# Patient Record
Sex: Female | Born: 1935 | Race: White | Hispanic: No | Smoking: Never smoker
Health system: Southern US, Community
[De-identification: ages and names within clinical notes are randomized; demographics above are authoritative.]

## PROBLEM LIST (undated history)

## (undated) DIAGNOSIS — I10 Essential (primary) hypertension: Secondary | ICD-10-CM

## (undated) DIAGNOSIS — E039 Hypothyroidism, unspecified: Secondary | ICD-10-CM

---

## 2003-10-02 ENCOUNTER — Ambulatory Visit (HOSPITAL_COMMUNITY): Admission: RE | Admit: 2003-10-02 | Discharge: 2003-10-02 | Payer: Self-pay | Admitting: Cardiology

## 2004-08-05 ENCOUNTER — Observation Stay (HOSPITAL_COMMUNITY): Admission: RE | Admit: 2004-08-05 | Discharge: 2004-08-06 | Payer: Self-pay | Admitting: General Surgery

## 2004-08-05 ENCOUNTER — Encounter (INDEPENDENT_AMBULATORY_CARE_PROVIDER_SITE_OTHER): Payer: Self-pay | Admitting: Specialist

## 2004-09-17 ENCOUNTER — Encounter: Admission: RE | Admit: 2004-09-17 | Discharge: 2004-09-17 | Payer: Self-pay | Admitting: Internal Medicine

## 2005-10-07 ENCOUNTER — Ambulatory Visit (HOSPITAL_COMMUNITY): Admission: RE | Admit: 2005-10-07 | Discharge: 2005-10-07 | Payer: Self-pay | Admitting: *Deleted

## 2005-10-07 ENCOUNTER — Encounter (INDEPENDENT_AMBULATORY_CARE_PROVIDER_SITE_OTHER): Payer: Self-pay | Admitting: Specialist

## 2007-10-08 ENCOUNTER — Encounter: Admission: RE | Admit: 2007-10-08 | Discharge: 2007-10-08 | Payer: Self-pay | Admitting: Internal Medicine

## 2010-03-24 ENCOUNTER — Encounter: Payer: Self-pay | Admitting: Internal Medicine

## 2010-06-09 ENCOUNTER — Other Ambulatory Visit: Payer: Self-pay | Admitting: Registered Nurse

## 2010-07-18 NOTE — Op Note (Signed)
NAME:  LTANYA, BAYLEY NO.:  [id number]   MEDICAL RECORD NO.:  [id number]          PATIENT TYPE:  AMB   LOCATION:  ENDO                         FACILITY:  [facility]   PHYSICIAN:  Georgiana Spinner, M.D.    DATE OF BIRTH:  01/25/1924   DATE OF PROCEDURE:  10/07/2005  DATE OF DISCHARGE:                                 OPERATIVE REPORT   PROCEDURE:  Upper endoscopy.   INDICATIONS:  Gastroesophageal reflux disease.   ANESTHESIA:  Fentanyl 40 mcg, Versed 3 mg.  With patient mildly sedated in  the left lateral decubitus position, Olympus videoscopic endoscope was  inserted in the mouth and passed under direct vision through the esophagus  which appeared normal although there was questionable area of Barrett's  photographed and biopsied.  We entered the stomach.  Fundus, body, antrum,  duodenal bulb, second portion of duodenum were visualized.  From this point  the endoscope was slowly withdrawn taking circumferential views of duodenal  mucosa until the endoscope had been pulled back in the stomach and placed on  retroflexion to the stomach from below.  The endoscope was straightened and  withdrawn taking circumferential views of the remaining gastric and  esophageal mucosa stopping to biopsy the gastric fundus where erythematous  changes of possible gastritis were seen.  The patient's vital signs, pulse  oximeter remained stable.  The patient tolerated procedure well without  apparent complications.   FINDINGS:  Question of gastritis, biopsied; question of Barrett's esophagus  biopsied, await biopsy report.  The patient will call me for results and  follow up with me as an outpatient.  Proceed to colonoscopy.           ______________________________  Georgiana Spinner, M.D.     GMO/MEDQ  D:  10/07/2005  T:  10/07/2005  Job:  [id number]

## 2010-07-18 NOTE — Op Note (Signed)
NAME:  Vicki [name], Vicki [name] NO.:  [id number]   MEDICAL RECORD NO.:  [id number]          PATIENT TYPE:  AMB   LOCATION:  DAY                          FACILITY:  [facility]   PHYSICIAN:  Anselm Pancoast. Weatherly, M.D.DATE OF BIRTH:  1923/11/27   DATE OF PROCEDURE:  08/05/2004  DATE OF DISCHARGE:                                 OPERATIVE REPORT   PREOPERATIVE DIAGNOSES:  Chronic cholecystitis with stones.   POSTOPERATIVE DIAGNOSES:  Chronic cholecystitis with stones.   OPERATION:  Laparoscopic cholecystectomy with cholangiogram.   ANESTHESIA:  General.   SURGEON:  Anselm Pancoast. Zachery Dakins, M.D.   ASSISTANT:  Lebron Conners, M.D.   HISTORY:  Vicki [name] is a 77 year old female who was referred to me at  the courtesy of Vangie Bicker, Rochel Brome. from [facility]  for symptomatic gallstones. The patient's preoperative liver function  studies and all were normal and she was given Cipro.   The patient was positioned on the OR table, anesthesia general endotracheal  tube, oral tube into the stomach later. The patient has PAS stockings,  positioned comfortably in the OR table. The abdomen was prepped with  Betadine surgical scrub and solution and draped in a sterile manner. A small  incision was made below the umbilicus, the fatty tissue dissected down, the  fascia identified and a small opening made, two Kocher's placed to pick up  the fascia and we very carefully opened into the peritoneal cavity. A  pursestring suture of #0 Vicryl was placed and the Hasson cannula  introduced. The upper 10 mm trocar was placed in the subxiphoid area and the  two lateral 5 mm trocars were placed by Dr. Orson Slick at the appropriate  lateral position. All the trocar sites were anesthetized with Marcaine with  adrenaline. The gallbladder was grasped upward and outward, retracted, the  adhesions proximally were separated carefully from the gallbladder and the  proximal portion of the  gallbladder in the junction with the cystic duct was  identified. We encompassed the cystic duct and then clipped it flush with  the junction of the gallbladder. A small opening was made in the proximal  cystic duct, a Cooke catheter introduced and held in place with a clip. The  x-ray showed good prompt filling of the extrahepatic biliary system and good  flow into the duodenum. The catheter was removed, the cystic duct was triply  clipped and then divided, the cystic artery was identified, triply clipped  proximally, singly distally and divided and then the gallbladder freed from  its bed with the hook electrocautery. Good hemostasis was obtained and then  the gallbladder was grasped, the camera and the upper 10 mm port withdrawn  with the gallbladder containing a good size single stone at the umbilicus.  The bed was again inspected, good hemostasis. The irrigating fluid was  aspirated and then the additional figure-of-eight suture of #0 Vicryl was  placed at the umbilicus and both sutures were tied. I then removed the 5 mm  trocars under direct vision, good  hemostasis and the carbon dioxide released, the upper 10 mm trocar  withdrawn.  The subcutaneous wounds were closed with 4-0 Vicryl, Benzoin and  Steri-Strips on the skin. The patient tolerated the procedure nicely and was  sent to the recovery room extubated in good satisfactory postop condition.      WJW/MEDQ  D:  08/05/2004  T:  08/05/2004  Job:  [id number]   cc:   Juline Patch, M.D.  [address]  [place], [place] [address]  Fax: [phone]

## 2010-07-18 NOTE — Op Note (Signed)
NAME:  Vicki [name], Vicki [name] NO.:  [id number]   MEDICAL RECORD NO.:  [id number]          PATIENT TYPE:  AMB   LOCATION:  ENDO                         FACILITY:  [facility]   PHYSICIAN:  Georgiana Spinner, M.D.    DATE OF BIRTH:  1923-05-24   DATE OF PROCEDURE:  10/07/2005  DATE OF DISCHARGE:                                 OPERATIVE REPORT   PROCEDURE:  Colonoscopy.   INDICATIONS:  Colon cancer screening, diarrhea.   ANESTHESIA:  Fentanyl 40 mcg, Versed 1 mg.   PROCEDURE:  With patient mildly sedated in the left lateral decubitus  position, the Olympus videoscopic colonoscope was inserted in the rectum and  passed under direct vision to the sigmoid colon.  Therefore I elected to  withdraw this, I then inserted the Olympus video colonoscope PCF 160, passed  this under direct vision with pressure applied.  We reached the cecum  identified by ileocecal valve and appendiceal orifice both which were  photographed.  From this point the colonoscope was slowly withdrawn taking  circumferential views of colonic mucosa stopping as we withdrew all the way  to the rectum only to take random biopsies of normal-appearing colonic  mucosa and stopping to photograph diverticula that was seen in the sigmoid  colon until we reached the rectum which appeared normal on direct and showed  hemorrhoids on retroflexed view.  The endoscope was straightened, withdrawn.  The patient's vital signs, pulse oximeter remained stable.  The patient  tolerated procedure well without apparent complications.   FINDINGS:  Diverticulosis of sigmoid colon fairly extensive, internal  hemorrhoids.  Await biopsy report.  The patient will call me for results and  follow up with me as an outpatient.           ______________________________  Georgiana Spinner, M.D.     GMO/MEDQ  D:  10/07/2005  T:  10/07/2005  Job:  [id number]

## 2011-01-19 ENCOUNTER — Other Ambulatory Visit: Payer: Self-pay | Admitting: Internal Medicine

## 2011-01-19 DIAGNOSIS — R1084 Generalized abdominal pain: Secondary | ICD-10-CM

## 2011-01-20 ENCOUNTER — Other Ambulatory Visit: Payer: Self-pay

## 2011-01-20 ENCOUNTER — Ambulatory Visit
Admission: RE | Admit: 2011-01-20 | Discharge: 2011-01-20 | Disposition: A | Discharge status: Home / Self Care | Payer: Self-pay | Source: Ambulatory Visit | Attending: Internal Medicine | Admitting: Internal Medicine

## 2011-01-20 DIAGNOSIS — R1084 Generalized abdominal pain: Secondary | ICD-10-CM

## 2011-01-20 MED ORDER — IOHEXOL 300 MG/ML  SOLN
100.0000 mL | Freq: Once | INTRAMUSCULAR | Status: AC | PRN
  Administered 2011-01-20: 100 mL via INTRAVENOUS

## 2012-05-16 IMAGING — CT CT ABD-PELV W/ CM
2 of 5 series · 16 of 46 positions shown, 18 images · IV contrast (READICAT/WATER & OMNI 300/[ID])
Comparison: None.

CLINICAL DATA: Abdominal pain generalized, mainly right lower
quadrant. Some epigastric pain for 3 weeks.  Constipation.

CT ABDOMEN AND PELVIS WITH CONTRAST
TECHNIQUE: Multidetector CT imaging of the abdomen and pelvis was
performed following the standard protocol during bolus
administration of intravenous contrast.
Contrast: 100mL OMNIPAQUE IOHEXOL 300 MG/ML IV SOLN

[Series 2: abd/pelvis with · axial · 0.89mm/px · z∈[-430,-30]mm · 13 of 90 slices shown, 15 images]
[im 5/90  soft-tissue]
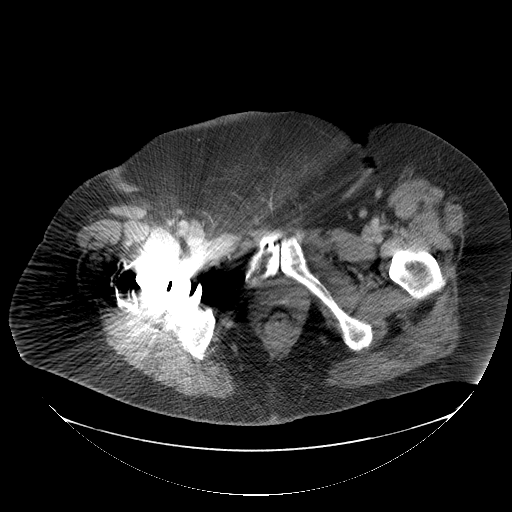
[im 5/90  bone]
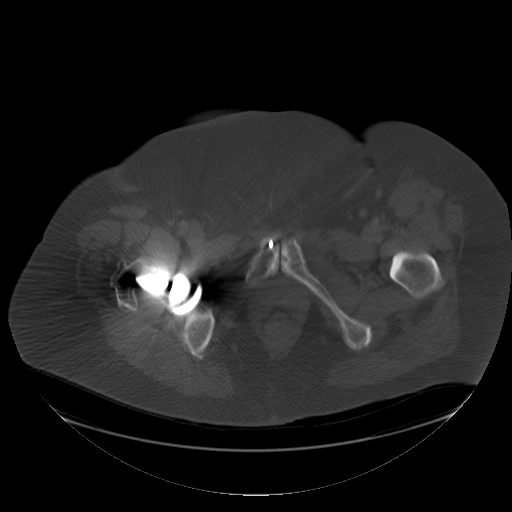
[im 15/90  soft-tissue]
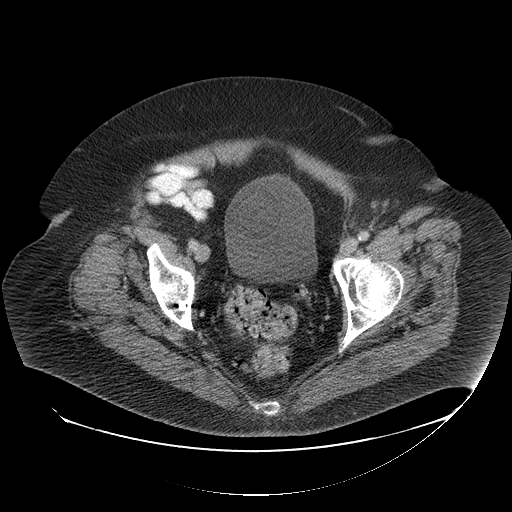
[im 19/90  soft-tissue]
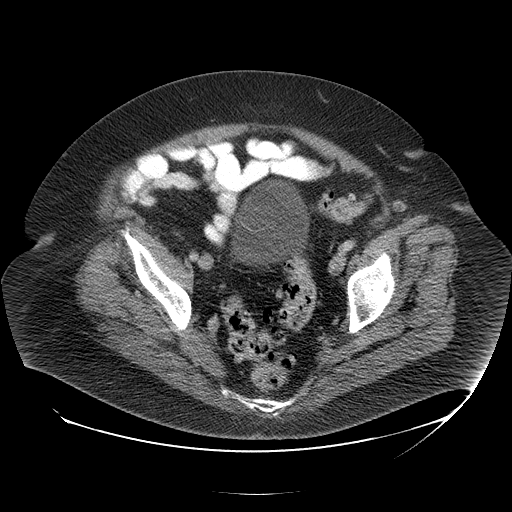
[im 24/90  soft-tissue]
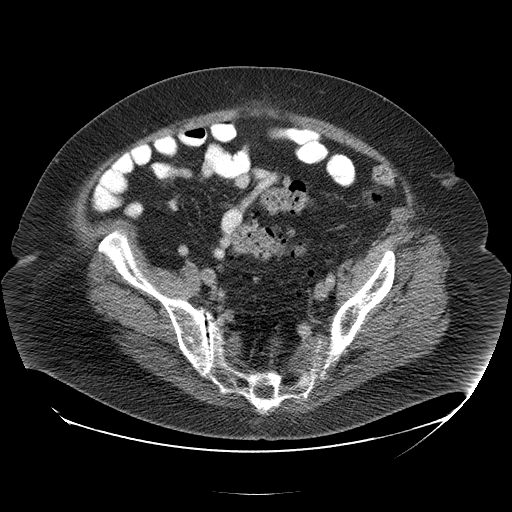
[im 33/90  soft-tissue]
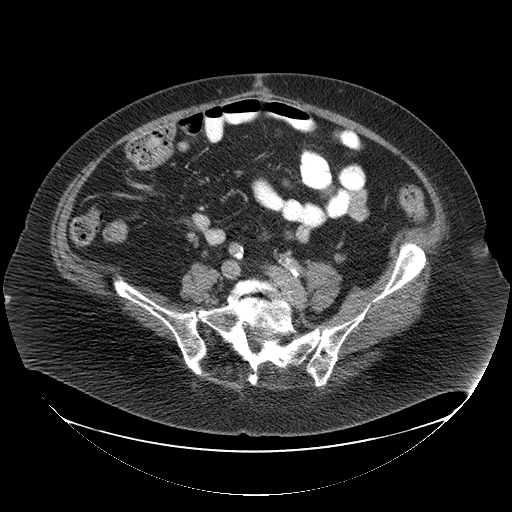
[im 38/90  soft-tissue]
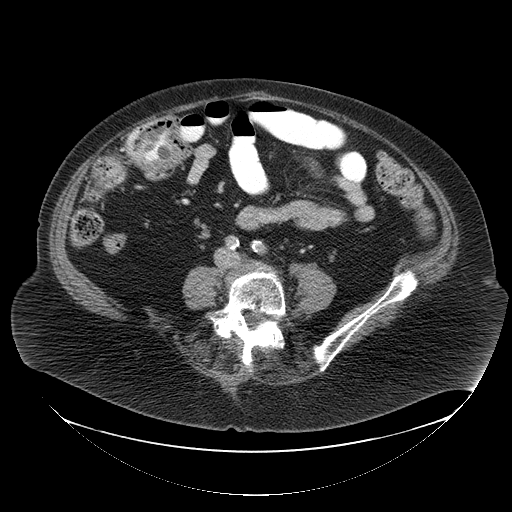
[im 47/90  soft-tissue]
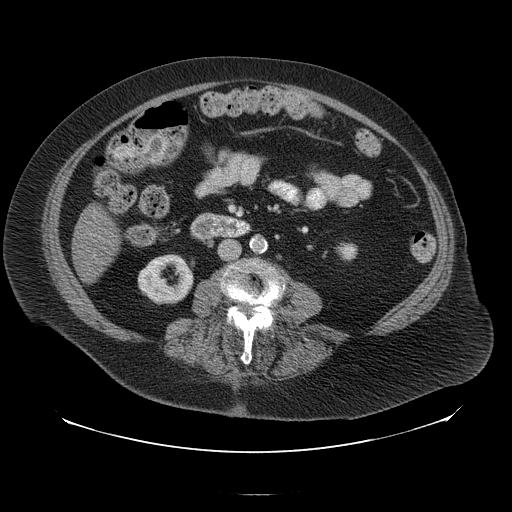
[im 52/90  soft-tissue]
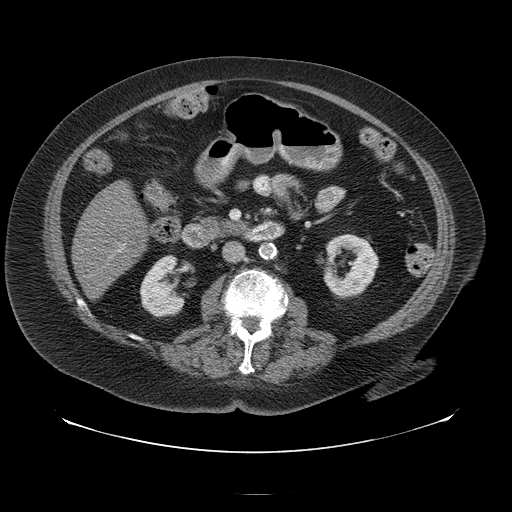
[im 57/90  soft-tissue]
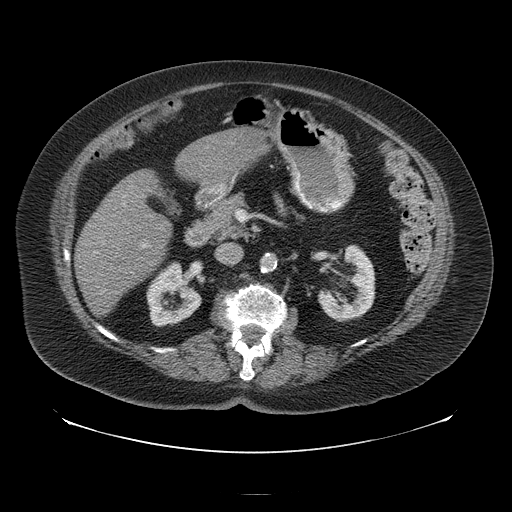
[im 57/90  bone]
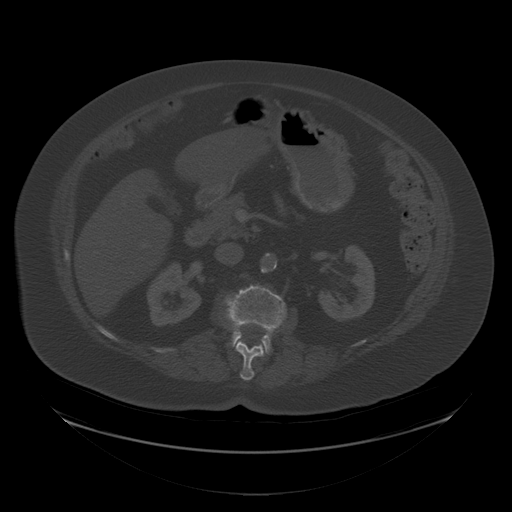
[im 66/90  soft-tissue]
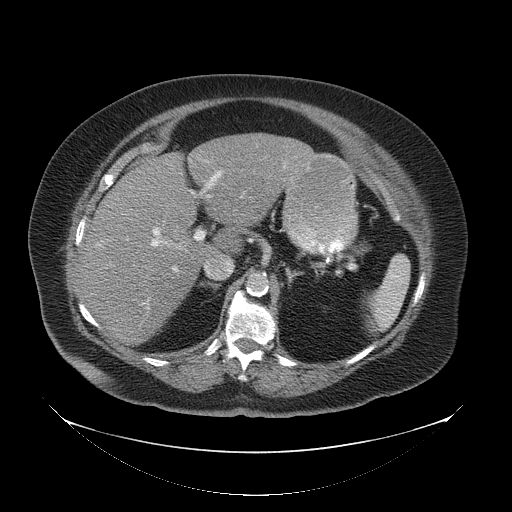
[im 71/90  soft-tissue]
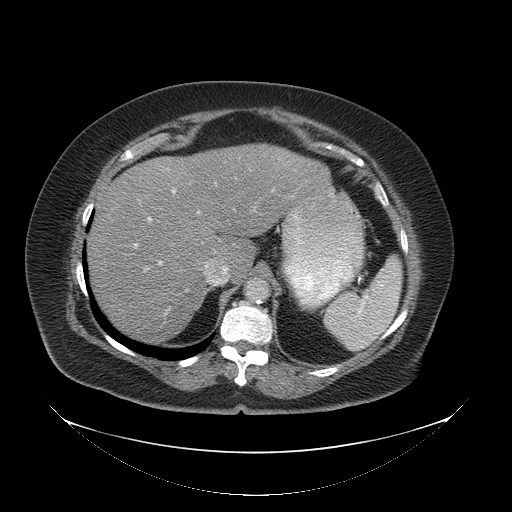
[im 75/90  soft-tissue]
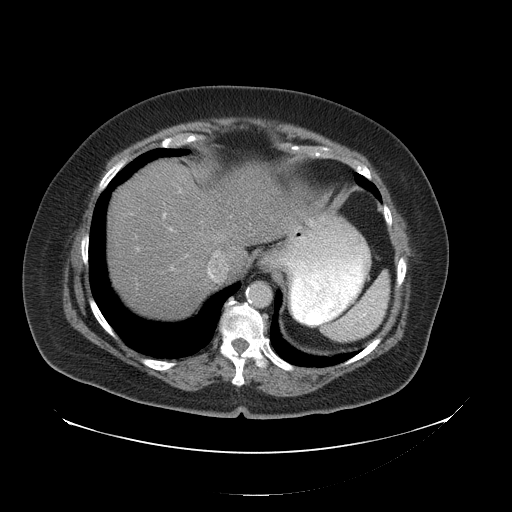
[im 85/90  soft-tissue]
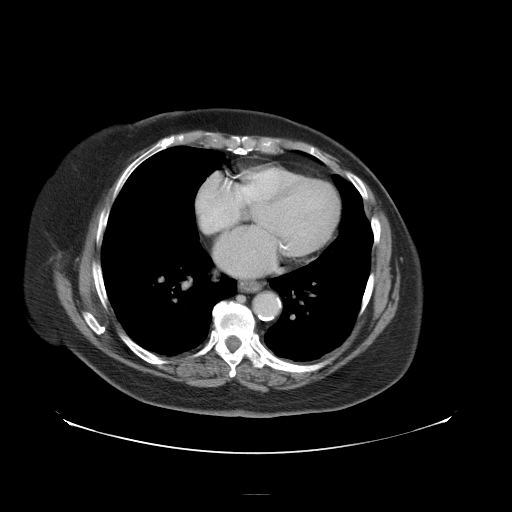

[Series 401: coronal · coronal · 0.89mm/px · 3 of 141 slices shown]
[im 47/141  soft-tissue]
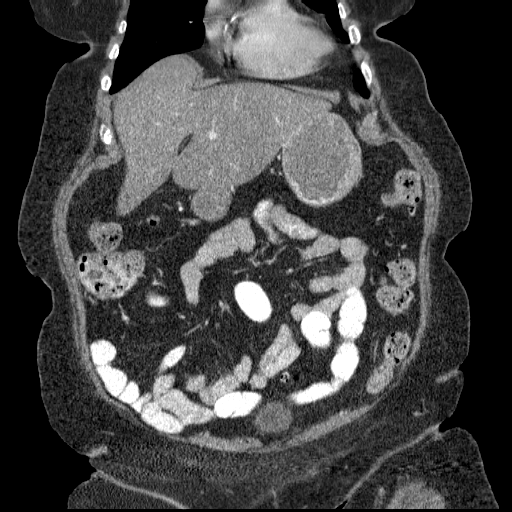
[im 63/141  soft-tissue]
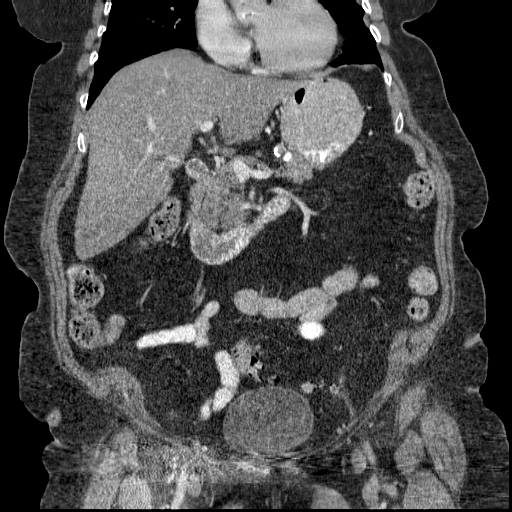
[im 78/141  soft-tissue]
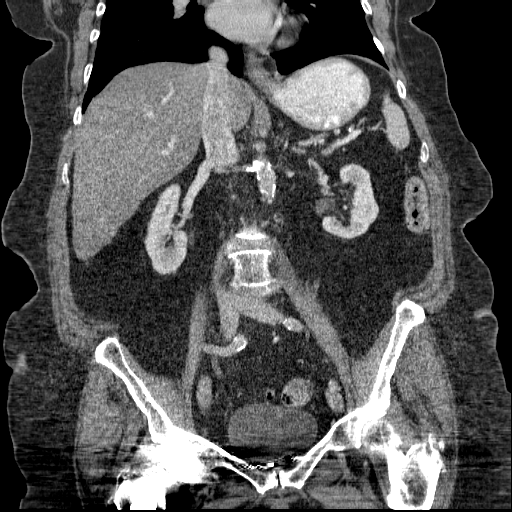

[16 of 46 positions shown; findings below may reference images not displayed]

FINDINGS: Colonic diverticula most notable sigmoid colon region
without surrounding inflammation.  No extraluminal bowel
inflammatory process noted.  Appendix not visualized.  Small hiatal
hernia.  No free fluid or free intraperitoneal air.

Coronary artery calcifications.  Heart size top normal.

Atherosclerotic type changes of the aorta and branch vessels.  No
abdominal aortic aneurysm.  Mild to moderate narrowing proximal
celiac artery and superior mesenteric artery. 1.0 cm splenic artery
aneurysm suspected.  Moderate narrowing left common iliac artery.

Fatty liver.  Left lobe of liver cysts side-by-side.  1.1 cm low
density lesion inferior aspect right lobe liver probably a cyst
although incompletely assessed.  Post cholecystectomy.

No focal splenic, pancreatic, adrenal or renal lesion.

Scoliosis and degenerative changes throughout the lumbar spine.
Various degrees of spinal stenosis and foraminal narrowing.  Donor
site of bone graft right ilium.  Sacroiliac joint degenerative
changes.

Post hysterectomy.  Noncontrast filled views of the urinary bladder
without gross abnormality.  No pelvic or abdominal adenopathy.  Top
normal sized portacaval lymph node.

Streak artifact caused by the right hip replacement.
IMPRESSION: Fatty liver containing cysts.

Colonic diverticula most notable sigmoid colon.  No bowel
inflammatory process noted.  Appendix not visualized.

Post cholecystectomy.

Atherosclerotic type changes coronary arteries, aorta and aortic
branch vessels as detailed above.  1.0 cm splenic artery aneurysm
suspected.

Scoliosis and degenerative changes.

## 2021-10-20 ENCOUNTER — Encounter (HOSPITAL_BASED_OUTPATIENT_CLINIC_OR_DEPARTMENT_OTHER): Payer: Medicare Other | Attending: Internal Medicine | Admitting: Internal Medicine

## 2021-10-20 DIAGNOSIS — L97812 Non-pressure chronic ulcer of other part of right lower leg with fat layer exposed: Secondary | ICD-10-CM | POA: Diagnosis not present

## 2021-10-20 DIAGNOSIS — W19XXXA Unspecified fall, initial encounter: Secondary | ICD-10-CM | POA: Insufficient documentation

## 2021-10-20 DIAGNOSIS — I110 Hypertensive heart disease with heart failure: Secondary | ICD-10-CM | POA: Insufficient documentation

## 2021-10-20 DIAGNOSIS — I87311 Chronic venous hypertension (idiopathic) with ulcer of right lower extremity: Secondary | ICD-10-CM | POA: Diagnosis not present

## 2021-10-20 DIAGNOSIS — E039 Hypothyroidism, unspecified: Secondary | ICD-10-CM | POA: Insufficient documentation

## 2021-10-20 DIAGNOSIS — T798XXA Other early complications of trauma, initial encounter: Secondary | ICD-10-CM | POA: Insufficient documentation

## 2021-10-20 DIAGNOSIS — I509 Heart failure, unspecified: Secondary | ICD-10-CM | POA: Insufficient documentation

## 2021-10-20 NOTE — Progress Notes (Signed)
Vicki [name], Vicki [name] ([id number]) . Visit Report for 10/20/2021 Abuse Risk Screen Details Patient Name: Date of Service: Vicki [name], Vicki [name] 10/20/2021 9:45 A M Medical Record Number: [id number] Patient Account Number: [id number] Date of Birth/Sex: Treating RN: October 15, 1923 (88 y.o. F) Primary Care Mardel Grudzien: Sonia Side Other Clinician: Referring Sherill Mangen: Treating Else Habermann/Extender: Radene Ou in Treatment: 0 Abuse Risk Screen Items Answer ABUSE RISK SCREEN: Has anyone close to you tried to hurt or harm you recentlyo No Do you feel uncomfortable with anyone in your familyo No Has anyone forced you do things that you didnt want to doo No Electronic Signature(s) Signed: 10/20/2021 4:55:34 PM By: Thayer Dallas Entered By: Thayer Dallas on 10/20/2021 10:21:19 -------------------------------------------------------------------------------- Activities of Daily Living Details Patient Name: Date of Service: Vicki [name] 10/20/2021 9:45 A M Medical Record Number: [id number] Patient Account Number: [id number] Date of Birth/Sex: Treating RN: 1923/04/18 (88 y.o. F) Primary Care Jayde Daffin: Sonia Side Other Clinician: Referring Landrie Beale: Treating Anhthu Perdew/Extender: Radene Ou in Treatment: 0 Activities of Daily Living Items Answer Activities of Daily Living (Please select one for each item) Drive Automobile Not Able [name] Medications ake Completely Able Use [name] elephone Completely Able Care for Appearance Completely Able Use [name] oilet Completely Able Bath / Shower Need Assistance Dress Self Completely Able Feed Self Completely Able Walk Completely Able Get In / Out Bed Completely Able Housework Completely Able Prepare Meals Completely Able Handle Money Completely Able Shop for Self Need Assistance Electronic Signature(s) Signed: 10/20/2021 4:55:34 PM By: Thayer Dallas Entered By: Thayer Dallas on 10/20/2021  10:22:23 -------------------------------------------------------------------------------- Education Screening Details Patient Name: Date of Service: Vicki Presume [name]. 10/20/2021 9:45 A M Medical Record Number: [id number] Patient Account Number: [id number] Date of Birth/Sex: Treating RN: [birth year] (88 y.o. F) Primary Care Dawnya Grams: Sonia Side Other Clinician: Referring Davinci Glotfelty: Treating Haasini Patnaude/Extender: Radene Ou in Treatment: 0 Primary Learner Assessed: Patient Learning Preferences/Education Level/Primary Language Learning Preference: Explanation, Demonstration, Communication Board Highest Education Level: College or Above Preferred Language: English Cognitive Barrier Language Barrier: No Translator Needed: No Memory Deficit: No Emotional Barrier: No Cultural/Religious Beliefs Affecting Medical Care: No Physical Barrier Impaired Vision: Yes Glasses Impaired Hearing: No Decreased Hand dexterity: No Knowledge/Comprehension Knowledge Level: High Comprehension Level: High Ability to understand written instructions: High Ability to understand verbal instructions: High Motivation Anxiety Level: Calm Cooperation: Cooperative Education Importance: Acknowledges Need Interest in Health Problems: Asks Questions Perception: Coherent Willingness to Engage in Self-Management High Activities: Readiness to Engage in Self-Management High Activities: Electronic Signature(s) Signed: 10/20/2021 4:55:34 PM By: Thayer Dallas Entered By: Thayer Dallas on 10/20/2021 10:22:58 -------------------------------------------------------------------------------- Fall Risk Assessment Details Patient Name: Date of Service: Vicki Presume [name]. 10/20/2021 9:45 A M Medical Record Number: [id number] Patient Account Number: [id number] Date of Birth/Sex: Treating RN: November 27, 1923 (88 y.o. Vicki [name] Primary Care Ilija Maxim: Sonia Side Other  Clinician: Referring Yvonnie Schinke: Treating Zarya Lasseigne/Extender: Radene Ou in Treatment: 0 Fall Risk Assessment Items Have you had 2 or more falls in the last 12 monthso 0 No Have you had any fall that resulted in injury in the last 12 monthso 0 Yes FALLS RISK SCREEN History of falling - immediate or within 3 months 25 Yes Secondary diagnosis (Do you have 2 or more medical diagnoseso) 0 No Ambulatory aid None/bed rest/wheelchair/nurse 0 No Crutches/cane/walker 15 Yes Furniture 0 No Intravenous therapy Access/Saline/Heparin Lock 0 No Gait/Transferring Normal/ bed rest/  wheelchair 0 No Weak (short steps with or without shuffle, stooped but able to lift head while walking, may seek 10 Yes support from furniture) Impaired (short steps with shuffle, may have difficulty arising from chair, head down, impaired 0 No balance) Mental Status Oriented to own ability 0 Yes Electronic Signature(s) Signed: 10/20/2021 4:49:28 PM By: Shawn Stall RN, BSN Entered By: Shawn Stall on 10/20/2021 10:11:19 -------------------------------------------------------------------------------- Foot Assessment Details Patient Name: Date of Service: Vicki Presume [name]. 10/20/2021 9:45 A M Medical Record Number: [id number] Patient Account Number: [id number] Date of Birth/Sex: Treating RN: 1923/09/21 (88 y.o. Vicki [name] Primary Care Sandralee Tarkington: Sonia Side Other Clinician: Referring Deah Ottaway: Treating Cledith Abdou/Extender: Radene Ou in Treatment: 0 Foot Assessment Items Site Locations + = Sensation present, - = Sensation absent, C = Callus, U = Ulcer R = Redness, W = Warmth, M = Maceration, PU = Pre-ulcerative lesion F = Fissure, S = Swelling, D = Dryness Assessment Right: Left: Other Deformity: No No Prior Foot Ulcer: No No Prior Amputation: No No Charcot Joint: No No Ambulatory Status: Ambulatory With Help Assistance Device: Walker GaitAdministrator, Civil Service) Signed: 10/20/2021 4:49:28 PM By: Shawn Stall RN, BSN Entered By: Shawn Stall on 10/20/2021 10:08:23 -------------------------------------------------------------------------------- Nutrition Risk Screening Details Patient Name: Date of Service: Vicki Presume [name]. 10/20/2021 9:45 A M Medical Record Number: [id number] Patient Account Number: [id number] Date of Birth/Sex: Treating RN: 30-May-1923 (88 y.o. Vicki [name] Primary Care Ivery Nanney: Sonia Side Other Clinician: Referring Domonique Cothran: Treating Afia Messenger/Extender: Radene Ou in Treatment: 0 Height (in): 62 Weight (lbs): 142 Body Mass Index (BMI): 26 Nutrition Risk Screening Items Score Screening NUTRITION RISK SCREEN: I have an illness or condition that made me change the kind and/or amount of food I eat 2 Yes I eat fewer than two meals per day 0 No I eat few fruits and vegetables, or milk products 0 No I have three or more drinks of beer, liquor or wine almost every day 0 No I have tooth or mouth problems that make it hard for me to eat 0 No I don'[name] always have enough money to buy the food I need 0 No I eat alone most of the time 0 No I take three or more different prescribed or over-the-counter drugs a day 1 Yes Without wanting to, I have lost or gained 10 pounds in the last six months 0 No I am not always physically able to shop, cook and/or feed myself 0 No Nutrition Protocols Good Risk Protocol Moderate Risk Protocol 0 Provide education on nutrition High Risk Proctocol Risk Level: Moderate Risk Score: 3 Electronic Signature(s) Signed: 10/20/2021 4:49:28 PM By: Shawn Stall RN, BSN Entered By: Shawn Stall on 10/20/2021 10:08:04

## 2021-10-20 NOTE — Progress Notes (Signed)
Vicki [name], Vicki [name] ([id number]) . Visit Report for 10/20/2021 Chief Complaint Document Details Patient Name: Date of Service: Vicki [name], Vicki [name] 10/20/2021 9:45 A M Medical Record Number: [id number] Patient Account Number: [id number] Date of Birth/Sex: Treating RN: 1923-11-29 (88 y.o. F) Primary Care Provider: Sonia Side Other Clinician: Referring Provider: Treating Provider/Extender: Radene Ou in Treatment: 0 Information Obtained from: Patient Chief Complaint 10/20/2021; right lower extremity wound status post trauma Electronic Signature(s) Signed: 10/20/2021 2:26:36 PM By: Geralyn Corwin DO Entered By: Geralyn Corwin on 10/20/2021 10:52:01 -------------------------------------------------------------------------------- Debridement Details Patient Name: Date of Service: Vicki [name]. 10/20/2021 9:45 A M Medical Record Number: [id number] Patient Account Number: [id number] Date of Birth/Sex: Treating RN: 11/04/1923 (88 y.o. Vicki [name], [name] Primary Care Provider: Sonia Side Other Clinician: Referring Provider: Treating Provider/Extender: Radene Ou in Treatment: 0 Debridement Performed for Assessment: Wound #1 Right,Lateral Lower Leg Performed By: Physician Geralyn Corwin, DO Debridement Type: Debridement Level of Consciousness (Pre-procedure): Awake and Alert Pre-procedure Verification/Time Out Yes - 10:30 Taken: Start Time: 10:31 Pain Control: Lidocaine 4% [name] Area Debrided (L x W): otal 13.6 (cm) x 5 (cm) = 68 (cm) Tissue and other material debrided: Viable, Non-Viable, Slough, Subcutaneous, Slough, Hyper-granulation Level: Skin/Subcutaneous Tissue Debridement Description: Excisional Instrument: Curette Bleeding: Minimum Hemostasis Achieved: Pressure End Time: 10:40 Procedural Pain: 0 Post Procedural Pain: 0 Response to Treatment: Procedure was tolerated well Level of  Consciousness (Post- Awake and Alert procedure): Post Debridement Measurements of Total Wound Length: (cm) 13.6 Width: (cm) 5 Depth: (cm) 0.5 Volume: (cm) 26.704 Character of Wound/Ulcer Post Debridement: Requires Further Debridement Post Procedure Diagnosis Same as Pre-procedure Electronic Signature(s) Signed: 10/20/2021 2:26:36 PM By: Geralyn Corwin DO Signed: 10/20/2021 4:49:28 PM By: Shawn Stall RN, BSN Entered By: Shawn Stall on 10/20/2021 10:40:29 -------------------------------------------------------------------------------- HPI Details Patient Name: Date of Service: Vicki [name]. 10/20/2021 9:45 A M Medical Record Number: [id number] Patient Account Number: [id number] Date of Birth/Sex: Treating RN: 1924/01/23 (88 y.o. F) Primary Care Provider: Sonia Side Other Clinician: Referring Provider: Treating Provider/Extender: Radene Ou in Treatment: 0 History of Present Illness HPI Description: Admission 10/20/2021 [name] is a 88 year old female with a past medical history of hypothyroidism and essential hypertension that presents to the clinic for a 60-month history of ulcer to the right lower extremity. On 08/15/2021 the patient experienced a mechanical fall and hit her right leg against her table. She developed a hematoma and she was evaluated in the ED for this issue. The hematoma was evacuated in the ED. She had x-rays of the tibia/fibula and ankle that showed no acute osseous abnormalities. She has been using silver alginate to the wound bed. She did have 2 rounds of doxycycline for this issue and recently completed her second course. She currently denies signs of infection. Electronic Signature(s) Signed: 10/20/2021 2:26:36 PM By: Geralyn Corwin DO Entered By: Geralyn Corwin on 10/20/2021 11:00:50 -------------------------------------------------------------------------------- Physical Exam Details Patient Name: Date  of Service: Vicki [name]. 10/20/2021 9:45 A M Medical Record Number: [id number] Patient Account Number: [id number] Date of Birth/Sex: Treating RN: 10/25/1923 (88 y.o. F) Primary Care Provider: Sonia Side Other Clinician: Referring Provider: Treating Provider/Extender: Radene Ou in Treatment: 0 Constitutional respirations regular, non-labored and within target range for patient.. Cardiovascular 2+ dorsalis pedis/posterior tibialis pulses. Psychiatric pleasant and cooperative. Notes Right lower extremity: [name] the anterior aspect there  is a large open wound with hyper granulated tissue, granulation tissue and nonviable tissue. No tunneling or o undermining noted. No increased warmth, erythema or purulent drainage noted. Venous stasis dermatitis. Electronic Signature(s) Signed: 10/20/2021 2:26:36 PM By: Geralyn Corwin DO Entered By: Geralyn Corwin on 10/20/2021 11:01:27 -------------------------------------------------------------------------------- Physician Orders Details Patient Name: Date of Service: Vicki [name]. 10/20/2021 9:45 A M Medical Record Number: [id number] Patient Account Number: [id number] Date of Birth/Sex: Treating RN: 04-02-1923 (88 y.o. Vicki [name] Primary Care Provider: Sonia Side Other Clinician: Referring Provider: Treating Provider/Extender: Radene Ou in Treatment: 0 Verbal / Phone Orders: No Diagnosis Coding ICD-10 Coding Code Description [id number] Non-pressure chronic ulcer of other part of right lower leg with fat layer exposed T79.8XXA Other early complications of trauma, initial encounter E03.9 Hypothyroidism, unspecified Follow-up Appointments ppointment in 1 week. - Dr. Mikey [name] and [place], Room 8 130pm 10/27/2021 Monday Return A ppointment in 2 weeks. - Dr. Mikey [name] and [place], Room 8 1115 am 11/04/2021 Tuesday Return A Anesthetic (In clinic) Topical Lidocaine 4%  applied to wound bed Bathing/ Shower/ Hygiene May shower with protection but do not get wound dressing(s) wet. Edema Control - Lymphedema / SCD / Other Elevate legs to the level of the heart or above for 30 minutes daily and/or when sitting, a frequency of: - 3-4 times a day throughout the day. Avoid standing for long periods of time. Exercise regularly Home Health New wound care orders this week; continue Home Health for wound care. May utilize formulary equivalent dressing for wound treatment orders unless otherwise specified. - home health to change once a week. wound center weekly. Hydrofera blue, abd pad, and kerlix coban as compression to right leg. Other Home Health Orders/Instructions: [facility] Wound Treatment Wound #1 - Lower Leg Wound Laterality: Right, Lateral Cleanser: Soap and Water (Home Health) 2 x Per Week/30 Days Discharge Instructions: May shower and wash wound with dial antibacterial soap and water prior to dressing change. Cleanser: Wound Cleanser (Home Health) 2 x Per Week/30 Days Discharge Instructions: Cleanse the wound with wound cleanser prior to applying a clean dressing using gauze sponges, not tissue or cotton balls. Peri-Wound Care: Sween Lotion (Moisturizing lotion) (Home Health) 2 x Per Week/30 Days Discharge Instructions: Apply moisturizing lotion as directed Topical: Gentamicin 2 x Per Week/30 Days Discharge Instructions: ***APPLY ONLY IN CLINIC.**** Topical: Mupirocin Ointment 2 x Per Week/30 Days Discharge Instructions: Apply Mupirocin ONLY IN CLINIC**** Prim Dressing: Hydrofera Blue Classic Foam, 4x4 in (Home Health) 2 x Per Week/30 Days ary Discharge Instructions: Moisten with saline prior to applying to wound bed Secondary Dressing: ABD Pad, 8x10 (Home Health) 2 x Per Week/30 Days Discharge Instructions: Apply over primary dressing as directed. Secondary Dressing: Woven Gauze Sponge, Non-Sterile 4x4 in (Home Health) 2 x Per Week/30  Days Discharge Instructions: Apply over primary dressing as directed. Compression Wrap: Kerlix Roll 4.5x3.1 (in/yd) (Home Health) 2 x Per Week/30 Days Discharge Instructions: Apply Kerlix and Coban compression as directed. Compression Wrap: Coban Self-Adherent Wrap 4x5 (in/yd) (Home Health) 2 x Per Week/30 Days Discharge Instructions: Apply over Kerlix as directed. Patient Medications llergies: penicillin, codeine, Sulfa (Sulfonamide Antibiotics), latex, penbutolol A Notifications Medication Indication Start End 10/20/2021 lidocaine DOSE topical 4 % cream - cream topical applied only in clinic for debridement by provider. Electronic Signature(s) Signed: 10/20/2021 2:26:36 PM By: Geralyn Corwin DO Entered By: Geralyn Corwin on 10/20/2021 11:01:34 -------------------------------------------------------------------------------- Problem List Details Patient Name: Date of Service: Driscilla Grammes  Debby Freiberg, Tremaine [name]. 10/20/2021 9:45 A M Medical Record Number: [id number] Patient Account Number: [id number] Date of Birth/Sex: Treating RN: [birth year] (88 y.o. F) Primary Care Provider: Sonia SidePang, Richard Y Other Clinician: Referring Provider: Treating Provider/Extender: Radene OuHoffman, Zeth Buday Pang, Richard Y Weeks in Treatment: 0 Active Problems ICD-10 Encounter Code Description Active Date MDM Diagnosis L97.812 Non-pressure chronic ulcer of other part of right lower leg with fat layer 10/20/2021 No Yes exposed T79.8XXA Other early complications of trauma, initial encounter 10/20/2021 No Yes E03.9 Hypothyroidism, unspecified 10/20/2021 No Yes I87.311 Chronic venous hypertension (idiopathic) with ulcer of right lower extremity 10/20/2021 No Yes Inactive Problems Resolved Problems Electronic Signature(s) Signed: 10/20/2021 2:26:36 PM By: Geralyn CorwinHoffman, Abdulloh Ullom DO Entered By: Geralyn CorwinHoffman, Phillis Thackeray on 10/20/2021 10:51:04 -------------------------------------------------------------------------------- Progress Note  Details Patient Name: Date of Service: Vicki PresumeA LDERMA N, Mishika [name]. 10/20/2021 9:45 A M Medical Record Number: [id number] Patient Account Number: [id number] Date of Birth/Sex: Treating RN: [birth year] (88 y.o. F) Primary Care Provider: Sonia SidePang, Richard Y Other Clinician: Referring Provider: Treating Provider/Extender: Radene OuHoffman, Dafna Romo Pang, Richard Y Weeks in Treatment: 0 Subjective Chief Complaint Information obtained from Patient 10/20/2021; right lower extremity wound status post trauma History of Present Illness (HPI) Admission 10/20/2021 Ms. Juanell FairlyRuth Davie is a 88 year old female with a past medical history of hypothyroidism and essential hypertension that presents to the clinic for a 3719-month history of ulcer to the right lower extremity. On 08/15/2021 the patient experienced a mechanical fall and hit her right leg against her table. She developed a hematoma and she was evaluated in the ED for this issue. The hematoma was evacuated in the ED. She had x-rays of the tibia/fibula and ankle that showed no acute osseous abnormalities. She has been using silver alginate to the wound bed. She did have 2 rounds of doxycycline for this issue and recently completed her second course. She currently denies signs of infection. Patient History Information obtained from Patient. Allergies penicillin, codeine, Sulfa (Sulfonamide Antibiotics), latex, penbutolol Family History Diabetes - Child, Heart Disease - Child,Mother,Father,Siblings, Hypertension - Father,Mother, No family history of Cancer, Hereditary Spherocytosis, Kidney Disease, Lung Disease, Seizures, Stroke, Thyroid Problems, Tuberculosis. Social History Never smoker, Marital Status - Widowed, Alcohol Use - Never, Drug Use - No History, Caffeine Use - Rarely. Medical History Eyes Denies history of Cataracts, Glaucoma, Optic Neuritis Ear/Nose/Mouth/Throat Denies history of Chronic sinus problems/congestion, Middle ear  problems Hematologic/Lymphatic Denies history of Anemia, Hemophilia, Human Immunodeficiency Virus, Lymphedema, Sickle Cell Disease Respiratory Denies history of Aspiration, Asthma, Chronic Obstructive Pulmonary Disease (COPD), Pneumothorax, Sleep Apnea, Tuberculosis Cardiovascular Patient has history of Congestive Heart Failure, Hypertension Denies history of Angina, Arrhythmia, Coronary Artery Disease, Deep Vein Thrombosis, Hypotension, Myocardial Infarction, Peripheral Arterial Disease, Peripheral Venous Disease, Phlebitis, Vasculitis Gastrointestinal Denies history of Cirrhosis , Colitis, Crohnoos, Hepatitis A, Hepatitis B, Hepatitis C Endocrine Denies history of Type I Diabetes, Type II Diabetes Immunological Denies history of Lupus Erythematosus, Raynaudoos, Scleroderma Integumentary (Skin) Denies history of History of Burn Musculoskeletal Patient has history of Osteoarthritis Denies history of Gout, Rheumatoid Arthritis, Osteomyelitis Neurologic Patient has history of Neuropathy Denies history of Dementia, Quadriplegia, Paraplegia, Seizure Disorder Psychiatric Denies history of Anorexia/bulimia, Confinement Anxiety Hospitalization/Surgery History - back surgery. - right hip surgery. - hysterotomy. Medical A Surgical History Notes nd Ear/Nose/Mouth/Throat hearing aides Genitourinary Stage three Review of Systems (ROS) Constitutional Symptoms (General Health) Complains or has symptoms of Marked Weight Change. Denies complaints or symptoms of Fatigue, Fever, Chills. Eyes Complains or has symptoms of Glasses / Contacts. Denies complaints or symptoms of Dry Eyes,  Vision Changes. Ear/Nose/Mouth/Throat Denies complaints or symptoms of Chronic sinus problems or rhinitis. Respiratory Denies complaints or symptoms of Chronic or frequent coughs, Shortness of Breath. Cardiovascular Denies complaints or symptoms of Chest pain. Gastrointestinal Denies complaints or symptoms of  Frequent diarrhea, Nausea, Vomiting. Endocrine Denies complaints or symptoms of Heat/cold intolerance. Integumentary (Skin) Complains or has symptoms of Wounds, Right leg Musculoskeletal Denies complaints or symptoms of Muscle Pain, Muscle Weakness. Psychiatric Denies complaints or symptoms of Claustrophobia, Suicidal. Objective Constitutional respirations regular, non-labored and within target range for patient.. Vitals Time Taken: 9:58 AM, Height: 62 in, Source: Stated, Weight: 142 lbs, Source: Stated, BMI: 26, Temperature: 98.1 F, Pulse: 60 bpm, Respiratory Rate: 18 breaths/min, Blood Pressure: 126/65 mmHg. Cardiovascular 2+ dorsalis pedis/posterior tibialis pulses. Psychiatric pleasant and cooperative. General Notes: Right lower extremity: [name] the anterior aspect there is a large open wound with hyper granulated tissue, granulation tissue and nonviable tissue. o No tunneling or undermining noted. No increased warmth, erythema or purulent drainage noted. Venous stasis dermatitis. Integumentary (Hair, Skin) Wound #1 status is Open. Original cause of wound was Trauma. The date acquired was: 08/04/2021. The wound is located on the Right,Lateral Lower Leg. The wound measures 13.6cm length x 5cm width x 0.5cm depth; 53.407cm^2 area and 26.704cm^3 volume. There is Fat Layer (Subcutaneous Tissue) exposed. There is no tunneling or undermining noted. There is a large amount of serosanguineous drainage noted. The wound margin is distinct with the outline attached to the wound base. There is small (1-33%) red, pink, hyper - granulation within the wound bed. There is a large (67-100%) amount of necrotic tissue within the wound bed including Adherent Slough. Assessment Active Problems ICD-10 Non-pressure chronic ulcer of other part of right lower leg with fat layer exposed Other early complications of trauma, initial encounter Hypothyroidism, unspecified Chronic venous hypertension (idiopathic)  with ulcer of right lower extremity Patient presents with a 64-month history of ulcer to her right lower extremity secondary to trauma and complicated by hematoma and venous insufficiency. She has tried silver alginate to the wound bed. She has home health that helps with dressing changes. ABIs were noncompressible however pulses were heard on Doppler to the dorsalis pedis and posterior tibial. She would benefit from a light compression such as Kerlix/Coban as she has evidence of venous insufficiency on exam. No signs of infection. I debrided nonviable tissue and I recommended gentamicin/mupirocin ointment to address any bioburden along with Hydrofera Blue under the wrap. She knows to not get this wet and cannot keep this on for more than 7 days. A cast protector bag was recommended if she would like to shower. Follow-up in 1 week. We will have home health change the dressing once weekly. Procedures Wound #1 Pre-procedure diagnosis of Wound #1 is an Abrasion located on the Right,Lateral Lower Leg . There was a Excisional Skin/Subcutaneous Tissue Debridement with a total area of 68 sq cm performed by Geralyn Corwin, DO. With the following instrument(s): Curette to remove Viable and Non-Viable tissue/material. Material removed includes Subcutaneous Tissue, Slough, and Hyper-granulation after achieving pain control using Lidocaine 4% [name] opical Solution. A time out was conducted at 10:30, prior to the start of the procedure. A Minimum amount of bleeding was controlled with Pressure. The procedure was tolerated well with a pain level of 0 throughout and a pain level of 0 following the procedure. Post Debridement Measurements: 13.6cm length x 5cm width x 0.5cm depth; 26.704cm^3 volume. Character of Wound/Ulcer Post Debridement requires further debridement. Post procedure Diagnosis Wound #  1: Same as Pre-Procedure Plan Follow-up Appointments: Return Appointment in 1 week. - Dr. Mikey [name] and [place], Room 8  130pm 10/27/2021 Monday Return Appointment in 2 weeks. - Dr. Mikey [name] and [place], Room 8 1115 am 11/04/2021 Tuesday Anesthetic: (In clinic) Topical Lidocaine 4% applied to wound bed Bathing/ Shower/ Hygiene: May shower with protection but do not get wound dressing(s) wet. Edema Control - Lymphedema / SCD / Other: Elevate legs to the level of the heart or above for 30 minutes daily and/or when sitting, a frequency of: - 3-4 times a day throughout the day. Avoid standing for long periods of time. Exercise regularly Home Health: New wound care orders this week; continue Home Health for wound care. May utilize formulary equivalent dressing for wound treatment orders unless otherwise specified. - home health to change once a week. wound center weekly. Hydrofera blue, abd pad, and kerlix coban as compression to right leg. Other Home Health Orders/Instructions: [facility] The following medication(s) was prescribed: lidocaine topical 4 % cream cream topical applied only in clinic for debridement by provider. was prescribed at facility WOUND #1: - Lower Leg Wound Laterality: Right, Lateral Cleanser: Soap and Water (Home Health) 2 x Per Week/30 Days Discharge Instructions: May shower and wash wound with dial antibacterial soap and water prior to dressing change. Cleanser: Wound Cleanser (Home Health) 2 x Per Week/30 Days Discharge Instructions: Cleanse the wound with wound cleanser prior to applying a clean dressing using gauze sponges, not tissue or cotton balls. Peri-Wound Care: Sween Lotion (Moisturizing lotion) (Home Health) 2 x Per Week/30 Days Discharge Instructions: Apply moisturizing lotion as directed Topical: Gentamicin 2 x Per Week/30 Days Discharge Instructions: ***APPLY ONLY IN CLINIC.**** Topical: Mupirocin Ointment 2 x Per Week/30 Days Discharge Instructions: Apply Mupirocin ONLY IN CLINIC**** Prim Dressing: Hydrofera Blue Classic Foam, 4x4 in (Home Health) 2 x Per Week/30  Days ary Discharge Instructions: Moisten with saline prior to applying to wound bed Secondary Dressing: ABD Pad, 8x10 (Home Health) 2 x Per Week/30 Days Discharge Instructions: Apply over primary dressing as directed. Secondary Dressing: Woven Gauze Sponge, Non-Sterile 4x4 in (Home Health) 2 x Per Week/30 Days Discharge Instructions: Apply over primary dressing as directed. Com pression Wrap: Kerlix Roll 4.5x3.1 (in/yd) (Home Health) 2 x Per Week/30 Days Discharge Instructions: Apply Kerlix and Coban compression as directed. Com pression Wrap: Coban Self-Adherent Wrap 4x5 (in/yd) (Home Health) 2 x Per Week/30 Days Discharge Instructions: Apply over Kerlix as directed. 1. In office sharp debridement 2. Gentamicin/mupirocin with Hydrofera Blue under Kerlix/Coban 3. Follow-up in 1 week Electronic Signature(s) Signed: 10/20/2021 2:26:36 PM By: Geralyn Corwin DO Entered By: Geralyn Corwin on 10/20/2021 11:04:04 -------------------------------------------------------------------------------- HxROS Details Patient Name: Date of Service: Vicki [name]. 10/20/2021 9:45 A M Medical Record Number: [id number] Patient Account Number: [id number] Date of Birth/Sex: Treating RN: [birth year] (88 y.o. F) Primary Care Provider: Sonia Side Other Clinician: Referring Provider: Treating Provider/Extender: Radene Ou in Treatment: 0 Information Obtained From Patient Constitutional Symptoms (General Health) Complaints and Symptoms: Positive for: Marked Weight Change Negative for: Fatigue; Fever; Chills Eyes Complaints and Symptoms: Positive for: Glasses / Contacts Negative for: Dry Eyes; Vision Changes Medical History: Negative for: Cataracts; Glaucoma; Optic Neuritis Ear/Nose/Mouth/Throat Complaints and Symptoms: Negative for: Chronic sinus problems or rhinitis Medical History: Negative for: Chronic sinus problems/congestion; Middle ear problems Past  Medical History Notes: hearing aides Respiratory Complaints and Symptoms: Negative for: Chronic or frequent coughs; Shortness of Breath Medical History: Negative for:  Aspiration; Asthma; Chronic Obstructive Pulmonary Disease (COPD); Pneumothorax; Sleep Apnea; Tuberculosis Cardiovascular Complaints and Symptoms: Negative for: Chest pain Medical History: Positive for: Congestive Heart Failure; Hypertension Negative for: Angina; Arrhythmia; Coronary Artery Disease; Deep Vein Thrombosis; Hypotension; Myocardial Infarction; Peripheral Arterial Disease; Peripheral Venous Disease; Phlebitis; Vasculitis Gastrointestinal Complaints and Symptoms: Negative for: Frequent diarrhea; Nausea; Vomiting Medical History: Negative for: Cirrhosis ; Colitis; Crohns; Hepatitis A; Hepatitis B; Hepatitis C Endocrine Complaints and Symptoms: Negative for: Heat/cold intolerance Medical History: Negative for: Type I Diabetes; Type II Diabetes Integumentary (Skin) Complaints and Symptoms: Positive for: Wounds Review of System Notes: Right leg Medical History: Negative for: History of Burn Musculoskeletal Complaints and Symptoms: Negative for: Muscle Pain; Muscle Weakness Medical History: Positive for: Osteoarthritis Negative for: Gout; Rheumatoid Arthritis; Osteomyelitis Psychiatric Complaints and Symptoms: Negative for: Claustrophobia; Suicidal Medical History: Negative for: Anorexia/bulimia; Confinement Anxiety Hematologic/Lymphatic Medical History: Negative for: Anemia; Hemophilia; Human Immunodeficiency Virus; Lymphedema; Sickle Cell Disease Genitourinary Medical History: Past Medical History Notes: Stage three Immunological Medical History: Negative for: Lupus Erythematosus; Raynauds; Scleroderma Neurologic Medical History: Positive for: Neuropathy Negative for: Dementia; Quadriplegia; Paraplegia; Seizure Disorder Oncologic Immunizations Pneumococcal Vaccine: Received Pneumococcal  Vaccination: Yes Received Pneumococcal Vaccination On or After 60th Birthday: Yes Implantable Devices Yes Hospitalization / Surgery History Type of Hospitalization/Surgery back surgery right hip surgery hysterotomy Family and Social History Cancer: No; Diabetes: Yes - Child; Heart Disease: Yes - Child,Mother,Father,Siblings; Hereditary Spherocytosis: No; Hypertension: Yes - Father,Mother; Kidney Disease: No; Lung Disease: No; Seizures: No; Stroke: No; Thyroid Problems: No; Tuberculosis: No; Never smoker; Marital Status - Widowed; Alcohol Use: Never; Drug Use: No History; Caffeine Use: Rarely; Financial Concerns: No; Food, Clothing or Shelter Needs: No; Support System Lacking: No; Transportation Concerns: No Electronic Signature(s) Signed: 10/20/2021 2:26:36 PM By: Geralyn Corwin DO Signed: 10/20/2021 4:55:34 PM By: Thayer Dallas Entered By: Thayer Dallas on 10/20/2021 10:28:34 -------------------------------------------------------------------------------- SuperBill Details Patient Name: Date of Service: Vicki [name]. 10/20/2021 Medical Record Number: [id number] Patient Account Number: [id number] Date of Birth/Sex: Treating RN: [birth year] (88 y.o. Vicki [name] Primary Care Provider: Sonia Side Other Clinician: Referring Provider: Treating Provider/Extender: Radene Ou in Treatment: 0 Diagnosis Coding ICD-10 Codes Code Description [id number] 9619 Non-pressure chronic ulcer of other part of right lower leg with fat layer exposed T79.8XXA Other early complications of trauma, initial encounter E03.9 Hypothyroidism, unspecified I87.311 Chronic venous hypertension (idiopathic) with ulcer of right lower extremity Facility Procedures CPT4 Code: [id number] 110 ICD L9 Description: 99214 - WOUND CARE VISIT-LEV 4 EST PT 42 - DEB SUBQ TISSUE 20 SQ CM/< -10 Diagnosis Description 7.812 Non-pressure chronic ulcer of other part of right lower leg  with fat layer exp Modifier: 1 osed Quantity: 1 CPT4 Code: 78469629 110 ICD L9 Description: 45 - DEB SUBQ TISS EA ADDL 20CM -10 Diagnosis Description 7.812 Non-pressure chronic ulcer of other part of right lower leg with fat layer exp Modifier: 3 osed Quantity: Physician Procedures : CPT4 Code Description Modifier [id number] 44010 - WC PHYS LEVEL 4 - NEW PT ICD-10 Diagnosis Description L97.812 Non-pressure chronic ulcer of other part of right lower leg with fat layer exposed I87.311 Chronic venous hypertension (idiopathic) with ulcer  of right lower extremity T79.8XXA Other early complications of trauma, initial encounter E03.9 Hypothyroidism, unspecified Quantity: 1 : [id number] 11042 - WC PHYS SUBQ TISS 20 SQ CM ICD-10 Diagnosis Description L97.812 Non-pressure chronic ulcer of other part of right lower leg with fat layer exposed Quantity: 1 : 4403474 11045 - WC PHYS  SUBQ TISS EA ADDL 20 CM ICD-10 Diagnosis Description L97.812 Non-pressure chronic ulcer of other part of right lower leg with fat layer exposed Quantity: 3 Electronic Signature(s) Signed: 10/20/2021 2:26:36 PM By: Geralyn Corwin DO Entered By: Geralyn Corwin on 10/20/2021 11:04:29

## 2021-10-20 NOTE — Progress Notes (Signed)
ELEASE, SWARM T ([id number]) . Visit Report for 10/20/2021 Allergy List Details Patient Name: Date of Service: Vicki [name], Vicki [name] 10/20/2021 9:45 A M Medical Record Number: [id number] Patient Account Number: [id number] Date of Birth/Sex: Treating RN: 1923/10/13 (88 y.o. F) Primary Care Lakie Mclouth: Mortimer Fries Other Clinician: Referring Myleka Moncure: Treating Leonardo Makris/Extender: Gust Rung in Treatment: 0 Allergies Active Allergies penicillin codeine Sulfa (Sulfonamide Antibiotics) latex penbutolol Allergy Notes Electronic Signature(s) Signed: 10/20/2021 4:55:34 PM By: Erenest Blank Entered By: Erenest Blank on 10/20/2021 10:06:09 -------------------------------------------------------------------------------- Arrival Information Details Patient Name: Date of Service: Vicki Rhymes T. 10/20/2021 9:45 A M Medical Record Number: [id number] Patient Account Number: [id number] Date of Birth/Sex: Treating RN: 09-Sep-1923 (88 y.o. F) Primary Care Sophiah Rolin: Mortimer Fries Other Clinician: Referring Merced Brougham: Treating Charla Criscione/Extender: Gust Rung in Treatment: 0 Visit Information Patient Arrived: Vicki [name] Time: 09:45 Accompanied By: daughter in law Transfer Assistance: None Patient Identification Verified: Yes Secondary Verification Process Completed: Yes Patient Requires Transmission-Based Precautions: No Patient Has Alerts: No Electronic Signature(s) Signed: 10/20/2021 4:55:34 PM By: Erenest Blank Entered By: Erenest Blank on 10/20/2021 09:49:37 -------------------------------------------------------------------------------- Clinic Level of Care Assessment Details Patient Name: Date of Service: Vicki [name] 10/20/2021 9:45 A M Medical Record Number: [id number] Patient Account Number: [id number] Date of Birth/Sex: Treating RN: 12/29/1923 (88 y.o. [name] Primary Care Oryn Casanova: Mortimer Fries  Other Clinician: Referring Chanita Boden: Treating Aryanah Enslow/Extender: Gust Rung in Treatment: 0 Clinic Level of Care Assessment Items TOOL 1 Quantity Score X- 1 0 Use when EandM and Procedure is performed on INITIAL visit ASSESSMENTS - Nursing Assessment / Reassessment X- 1 20 General Physical Exam (combine w/ comprehensive assessment (listed just below) when performed on new pt. evals) X- 1 25 Comprehensive Assessment (HX, ROS, Risk Assessments, Wounds Hx, etc.) ASSESSMENTS - Wound and Skin Assessment / Reassessment X- 1 10 Dermatologic / Skin Assessment (not related to wound area) ASSESSMENTS - Ostomy and/or Continence Assessment and Care _0  - 0 Incontinence Assessment and Management _1  - 0 Ostomy Care Assessment and Management (repouching, etc.) PROCESS - Coordination of Care _2  - 0 Simple Patient / Family Education for ongoing care X- 1 20 Complex (extensive) Patient / Family Education for ongoing care X- 1 10 Staff obtains Programmer, systems, Records, T Results / Process Orders est X- 1 10 Staff telephones HHA, Nursing Homes / Clarify orders / etc _3  - 0 Routine Transfer to another Facility (non-emergent condition) _4  - 0 Routine Hospital Admission (non-emergent condition) X- 1 15 New Admissions / Biomedical engineer / Ordering NPWT Apligraf, etc. , _5  - 0 Emergency Hospital Admission (emergent condition) PROCESS - Special Needs _6  - 0 Pediatric / Minor Patient Management _7  - 0 Isolation Patient Management _8  - 0 Hearing / Language / Visual special needs _9  - 0 Assessment of Community assistance (transportation, D/C planning, etc.) _10  - 0 Additional assistance / Altered mentation _11  - 0 Support Surface(s) Assessment (bed, cushion, seat, etc.) INTERVENTIONS - Miscellaneous _12  - 0 External ear exam _13  - 0 Patient Transfer (multiple staff / Civil Service fast streamer / Similar devices) _14  - 0 Simple Staple / Suture removal (25 or less) _15  -  0 Complex Staple / Suture removal (26 or more) _16  - 0 Hypo/Hyperglycemic Management (do not check if billed separately) X- 1 15 Ankle / Brachial Index (ABI) - do not check if billed separately Has the patient been seen at the hospital within the last three years:  Yes Total Score: 125 Level Of Care: New/Established - Level 4 Electronic Signature(s) Signed: 10/20/2021 4:49:28 PM By: Deon Pilling RN, BSN Signed: 10/20/2021 4:49:28 PM By: Deon Pilling RN, BSN Entered By: Deon Pilling on 10/20/2021 10:42:35 -------------------------------------------------------------------------------- Encounter Discharge Information Details Patient Name: Date of Service: Vicki Rhymes T. 10/20/2021 9:45 A M Medical Record Number: [id number] Patient Account Number: [id number] Date of Birth/Sex: Treating RN: Dec 25, 1923 (88 y.o. Vicki [name], Meta.Reding Primary Care Alanta Scobey: Mortimer Fries Other Clinician: Referring Kiegan Macaraeg: Treating Suresh Audi/Extender: Gust Rung in Treatment: 0 Encounter Discharge Information Items Post Procedure Vitals Discharge Condition: Stable Temperature (F): 98.1 Ambulatory Status: Walker Pulse (bpm): 60 Discharge Destination: Home Respiratory Rate (breaths/min): 18 Transportation: Private Auto Blood Pressure (mmHg): 126/65 Accompanied By: Vicki [name] IN LAW Schedule Follow-up Appointment: Yes Clinical Summary of Care: Electronic Signature(s) Signed: 10/20/2021 4:49:28 PM By: Deon Pilling RN, BSN Entered By: Deon Pilling on 10/20/2021 10:43:19 -------------------------------------------------------------------------------- Lower Extremity Assessment Details Patient Name: Date of Service: Vicki Rhymes T. 10/20/2021 9:45 A M Medical Record Number: [id number] Patient Account Number: [id number] Date of Birth/Sex: Treating RN: Jun 28, 1923 (88 y.o. Vicki [name], Meta.Reding Primary Care Athen Riel: Mortimer Fries Other Clinician: Referring  Shreyansh Tiffany: Treating Consuella Scurlock/Extender: Gust Rung in Treatment: 0 Edema Assessment Assessed: [Left: No] [Right: Yes] Edema: [Left: Ye] [Right: s] Calf Left: Right: Point of Measurement: 28 cm From Medial Instep 34.5 cm Ankle Left: Right: Point of Measurement: 12 cm From Medial Instep 22 cm Knee To Floor Left: Right: From Medial Instep 42 cm Vascular Assessment Pulses: Dorsalis Pedis Palpable: [Right:Yes] Doppler Audible: [Right:Yes] Posterior Tibial Palpable: [Right:No Inaudible] Notes Unable to complete ABI due to unable to hear pulse with BP cuff. Electronic Signature(s) Signed: 10/20/2021 4:49:28 PM By: Deon Pilling RN, BSN Entered By: Deon Pilling on 10/20/2021 10:09:18 -------------------------------------------------------------------------------- Multi Wound Chart Details Patient Name: Date of Service: Vicki Rhymes T. 10/20/2021 9:45 A M Medical Record Number: [id number] Patient Account Number: [id number] Date of Birth/Sex: Treating RN: 03-09-1923 (88 y.o. F) Primary Care Antwoine Zorn: Mortimer Fries Other Clinician: Referring Vernetta Dizdarevic: Treating Heena Woodbury/Extender: Gust Rung in Treatment: 0 Vital Signs Height(in): 62 Pulse(bpm): 60 Weight(lbs): 142 Blood Pressure(mmHg): 126/65 Body Mass Index(BMI): 26 Temperature(F): 98.1 Respiratory Rate(breaths/min): 18 Photos: [N/A:N/A] Right, Lateral Lower Leg N/A N/A Wound Location: Trauma N/A N/A Wounding Event: Abrasion N/A N/A Primary Etiology: Lymphedema N/A N/A Secondary Etiology: 08/04/2021 N/A N/A Date Acquired: 0 N/A N/A Weeks of Treatment: Open N/A N/A Wound Status: No N/A N/A Wound Recurrence: 13.6x5x0.5 N/A N/A Measurements L x W x D (cm) 53.407 N/A N/A A (cm) : rea 26.704 N/A N/A Volume (cm) : Full Thickness Without Exposed N/A N/A Classification: Support Structures Large N/A N/A Exudate A mount: Serosanguineous N/A  N/A Exudate Type: red, brown N/A N/A Exudate Color: Distinct, outline attached N/A N/A Wound Margin: Small (1-33%) N/A N/A Granulation A mount: Red, Pink, Hyper-granulation N/A N/A Granulation Quality: Large (67-100%) N/A N/A Necrotic A mount: Fat Layer (Subcutaneous Tissue): Yes N/A N/A Exposed Structures: Fascia: No Tendon: No Muscle: No Joint: No Bone: No Small (1-33%) N/A N/A Epithelialization: Debridement - Excisional N/A N/A Debridement: Pre-procedure Verification/Time Out 10:30 N/A N/A Taken: Lidocaine 4% Topical Solution N/A N/A Pain Control: Subcutaneous, Slough N/A N/A Tissue Debrided: Skin/Subcutaneous Tissue N/A N/A Level: 68 N/A N/A Debridement A (sq cm): rea Curette N/A N/A Instrument: Minimum N/A N/A Bleeding: Pressure N/A N/A Hemostasis Achieved: 0 N/A N/A Procedural Pain: 0  N/A N/A Post Procedural Pain: Procedure was tolerated well N/A N/A Debridement Treatment Response: 13.6x5x0.5 N/A N/A Post Debridement Measurements L x W x D (cm) 26.704 N/A N/A Post Debridement Volume: (cm) Debridement N/A N/A Procedures Performed: Treatment Notes Wound #1 (Lower Leg) Wound Laterality: Right, Lateral Cleanser Soap and Water Discharge Instruction: May shower and wash wound with dial antibacterial soap and water prior to dressing change. Wound Cleanser Discharge Instruction: Cleanse the wound with wound cleanser prior to applying a clean dressing using gauze sponges, not tissue or cotton balls. Peri-Wound Care Sween Lotion (Moisturizing lotion) Discharge Instruction: Apply moisturizing lotion as directed Topical Gentamicin Discharge Instruction: ***APPLY ONLY IN CLINIC.**** Mupirocin Ointment Discharge Instruction: Apply Mupirocin ONLY IN CLINIC**** Primary Dressing Hydrofera Blue Classic Foam, 4x4 in Discharge Instruction: Moisten with saline prior to applying to wound bed Secondary Dressing ABD Pad, 8x10 Discharge Instruction: Apply over  primary dressing as directed. Woven Gauze Sponge, Non-Sterile 4x4 in Discharge Instruction: Apply over primary dressing as directed. Secured With Compression Wrap Kerlix Roll 4.5x3.1 (in/yd) Discharge Instruction: Apply Kerlix and Coban compression as directed. Coban Self-Adherent Wrap 4x5 (in/yd) Discharge Instruction: Apply over Kerlix as directed. Compression Stockings Add-Ons Electronic Signature(s) Signed: 10/20/2021 2:26:36 PM By: Kalman Shan DO Entered By: Kalman Shan on 10/20/2021 10:51:33 -------------------------------------------------------------------------------- Multi-Disciplinary Care Plan Details Patient Name: Date of Service: Vicki Rhymes T. 10/20/2021 9:45 A M Medical Record Number: [id number] Patient Account Number: [id number] Date of Birth/Sex: Treating RN: Jul 23, 1923 (88 y.o. Vicki [name], Meta.Reding Primary Care Shriyan Arakawa: Mortimer Fries Other Clinician: Referring Eyal Greenhaw: Treating Lakyla Biswas/Extender: Gust Rung in Treatment: 0 Active Inactive Nutrition Nursing Diagnoses: Potential for alteratiion in Nutrition/Potential for imbalanced nutrition Goals: Patient/caregiver agrees to and verbalizes understanding of need to obtain nutritional consultation Date Initiated: 10/20/2021 Target Resolution Date: 11/06/2021 Goal Status: Active Interventions: Provide education on nutrition Treatment Activities: Patient referred to Primary Care Physician for further nutritional evaluation : 10/20/2021 Notes: Orientation to the Wound Care Program Nursing Diagnoses: Knowledge deficit related to the wound healing center program Goals: Patient/caregiver will verbalize understanding of the [place] Date Initiated: 10/20/2021 Target Resolution Date: 11/06/2021 Goal Status: Active Interventions: Provide education on orientation to the wound center Notes: Pain, Acute or Chronic Nursing Diagnoses: Pain, acute or  chronic: actual or potential Potential alteration in comfort, pain Goals: Patient will verbalize adequate pain control and receive pain control interventions during procedures as needed Date Initiated: 10/20/2021 Target Resolution Date: 11/06/2021 Goal Status: Active Patient/caregiver will verbalize comfort level met Date Initiated: 10/20/2021 Target Resolution Date: 11/06/2021 Goal Status: Active Interventions: Assess comfort goal upon admission Provide education on pain management Treatment Activities: Administer pain control measures as ordered : 10/20/2021 Notes: Wound/Skin Impairment Nursing Diagnoses: Knowledge deficit related to ulceration/compromised skin integrity Goals: Patient/caregiver will verbalize understanding of skin care regimen Date Initiated: 10/20/2021 Target Resolution Date: 11/06/2021 Goal Status: Active Interventions: Assess patient/caregiver ability to perform ulcer/skin care regimen upon admission and as needed Assess ulceration(s) every visit Provide education on ulcer and skin care Treatment Activities: Skin care regimen initiated : 10/20/2021 Topical wound management initiated : 10/20/2021 Notes: Electronic Signature(s) Signed: 10/20/2021 4:49:28 PM By: Deon Pilling RN, BSN Entered By: Deon Pilling on 10/20/2021 10:25:50 -------------------------------------------------------------------------------- Pain Assessment Details Patient Name: Date of Service: Vicki Rhymes T. 10/20/2021 9:45 A M Medical Record Number: [id number] Patient Account Number: [id number] Date of Birth/Sex: Treating RN: 09/08/1923 (88 y.o. [name] Primary Care Jahrell Hamor: Mortimer Fries Other Clinician: Referring Davanna He:  Treating Karla Pavone/Extender: Gust Rung in Treatment: 0 Active Problems Location of Pain Severity and Description of Pain Patient Has Paino No Site Locations Pain Management and Medication Current Pain  Management: Notes Currently 0/10. Electronic Signature(s) Signed: 10/20/2021 4:49:28 PM By: Deon Pilling RN, BSN Entered By: Deon Pilling on 10/20/2021 10:11:07 -------------------------------------------------------------------------------- Patient/Caregiver Education Details Patient Name: Date of Service: Sheliah Hatch 8/21/2023andnbsp9:45 A M Medical Record Number: [id number] Patient Account Number: [id number] Date of Birth/Gender: Treating RN: [birth year] (88 y.o. [name] Primary Care Physician: Mortimer Fries Other Clinician: Referring Physician: Treating Physician/Extender: Gust Rung in Treatment: 0 Education Assessment Education Provided To: Patient Education Topics Provided Nutrition: Handouts: Nutrition Methods: Explain/Verbal, Printed Responses: Reinforcements needed [place]: o Handouts: Welcome T [facility] [place] o Methods: Explain/Verbal, Printed Responses: Reinforcements needed Wound/Skin Impairment: Handouts: Caring for Your Ulcer, Skin Care Do's and Dont's Methods: Explain/Verbal, Printed Responses: Reinforcements needed Electronic Signature(s) Signed: 10/20/2021 4:49:28 PM By: Deon Pilling RN, BSN Entered By: Deon Pilling on 10/20/2021 10:26:15 -------------------------------------------------------------------------------- Wound Assessment Details Patient Name: Date of Service: Vicki Rhymes T. 10/20/2021 9:45 A M Medical Record Number: [id number] Patient Account Number: [id number] Date of Birth/Sex: Treating RN: Feb 10, 1924 (88 y.o. Vicki [name], Meta.Reding Primary Care Tynslee Bowlds: Mortimer Fries Other Clinician: Referring Rafeef Lau: Treating Javonn Gauger/Extender: Gust Rung in Treatment: 0 Wound Status Wound Number: 1 Primary Etiology: Abrasion Wound Location: Right, Lateral Lower Leg Secondary Etiology: Lymphedema Wounding Event: Trauma Wound  Status: Open Date Acquired: 08/04/2021 Weeks Of Treatment: 0 Clustered Wound: No Photos Wound Measurements Length: (cm) 13.6 Width: (cm) 5 Depth: (cm) 0.5 Area: (cm) 53.407 Volume: (cm) 26.704 % Reduction in Area: % Reduction in Volume: Epithelialization: Small (1-33%) Tunneling: No Undermining: No Wound Description Classification: Full Thickness Without Exposed Support Structures Wound Margin: Distinct, outline attached Exudate Amount: Large Exudate Type: Serosanguineous Exudate Color: red, brown Foul Odor After Cleansing: No Slough/Fibrino Yes Wound Bed Granulation Amount: Small (1-33%) Exposed Structure Granulation Quality: Red, Pink, Hyper-granulation Fascia Exposed: No Necrotic Amount: Large (67-100%) Fat Layer (Subcutaneous Tissue) Exposed: Yes Necrotic Quality: Adherent Slough Tendon Exposed: No Muscle Exposed: No Joint Exposed: No Bone Exposed: No Treatment Notes Wound #1 (Lower Leg) Wound Laterality: Right, Lateral Cleanser Soap and Water Discharge Instruction: May shower and wash wound with dial antibacterial soap and water prior to dressing change. Wound Cleanser Discharge Instruction: Cleanse the wound with wound cleanser prior to applying a clean dressing using gauze sponges, not tissue or cotton balls. Peri-Wound Care Sween Lotion (Moisturizing lotion) Discharge Instruction: Apply moisturizing lotion as directed Topical Gentamicin Discharge Instruction: ***APPLY ONLY IN CLINIC.**** Mupirocin Ointment Discharge Instruction: Apply Mupirocin ONLY IN CLINIC**** Primary Dressing Hydrofera Blue Classic Foam, 4x4 in Discharge Instruction: Moisten with saline prior to applying to wound bed Secondary Dressing ABD Pad, 8x10 Discharge Instruction: Apply over primary dressing as directed. Woven Gauze Sponge, Non-Sterile 4x4 in Discharge Instruction: Apply over primary dressing as directed. Secured With Compression Wrap Kerlix Roll 4.5x3.1  (in/yd) Discharge Instruction: Apply Kerlix and Coban compression as directed. Coban Self-Adherent Wrap 4x5 (in/yd) Discharge Instruction: Apply over Kerlix as directed. Compression Stockings Add-Ons Electronic Signature(s) Signed: 10/20/2021 4:49:28 PM By: Deon Pilling RN, BSN Entered By: Deon Pilling on 10/20/2021 10:10:50 -------------------------------------------------------------------------------- Vitals Details Patient Name: Date of Service: Vicki Rhymes T. 10/20/2021 9:45 A M Medical Record Number: [id number] Patient Account Number: [id number] Date of Birth/Sex: Treating RN: 1924-02-07 (88  y.o. F) Primary Care Deirdre Gryder: Mortimer Fries Other Clinician: Referring Eldred Lievanos: Treating Laasia Arcos/Extender: Gust Rung in Treatment: 0 Vital Signs Time Taken: 09:58 Temperature (F): 98.1 Height (in): 62 Pulse (bpm): 60 Source: Stated Respiratory Rate (breaths/min): 18 Weight (lbs): 142 Blood Pressure (mmHg): 126/65 Source: Stated Reference Range: 80 - 120 mg / dl Body Mass Index (BMI): 26 Electronic Signature(s) Signed: 10/20/2021 4:55:34 PM By: Erenest Blank Entered By: Erenest Blank on 10/20/2021 10:00:56

## 2021-10-27 ENCOUNTER — Encounter (HOSPITAL_BASED_OUTPATIENT_CLINIC_OR_DEPARTMENT_OTHER): Payer: Medicare Other | Admitting: Internal Medicine

## 2021-10-27 DIAGNOSIS — T798XXA Other early complications of trauma, initial encounter: Secondary | ICD-10-CM | POA: Diagnosis not present

## 2021-10-27 DIAGNOSIS — L97812 Non-pressure chronic ulcer of other part of right lower leg with fat layer exposed: Secondary | ICD-10-CM

## 2021-10-27 NOTE — Progress Notes (Signed)
[name], [name] ([id number]) . Visit Report for 10/27/2021 Arrival Information Details Patient Name: Date of Service: Vicki [name], Vicki [name] 10/27/2021 1:30 PM Medical Record Number: [id number] Patient Account Number: [id number] Date of Birth/Sex: Treating RN: [birth year] (88 y.o. Vicki [name], Meta.Reding Primary Care Tate Jerkins: Mortimer Fries Other Clinician: Referring Idona Stach: Treating Vi Whitesel/Extender: Gust Rung in Treatment: 1 Visit Information History Since Last Visit Added or deleted any medications: No Patient Arrived: Vicki [name] Any new allergies or adverse reactions: No Arrival Time: 13:36 Had a fall or experienced change in No Accompanied By: son activities of daily living that may affect Transfer Assistance: None risk of falls: Patient Identification Verified: Yes Signs or symptoms of abuse/neglect since last visito No Secondary Verification Process Completed: Yes Hospitalized since last visit: No Patient Requires Transmission-Based Precautions: No Implantable device outside of the clinic excluding No Patient Has Alerts: No cellular tissue based products placed in the center since last visit: Has Dressing in Place as Prescribed: Yes Has Compression in Place as Prescribed: Yes Pain Present Now: No Electronic Signature(s) Signed: 10/27/2021 4:41:46 PM By: Erenest Blank Entered By: Erenest Blank on 10/27/2021 13:38:32 -------------------------------------------------------------------------------- Encounter Discharge Information Details Patient Name: Date of Service: Vicki Rhymes T. 10/27/2021 1:30 PM Medical Record Number: [id number] Patient Account Number: [id number] Date of Birth/Sex: Treating RN: 1923/09/22 (88 y.o. Vicki [name], Meta.Reding Primary Care Abriana Saltos: Mortimer Fries Other Clinician: Referring Addis Bennie: Treating Jennifer Holland/Extender: Gust Rung in Treatment: 1 Encounter Discharge Information Items Post  Procedure Vitals Discharge Condition: Stable Temperature (F): 98.1 Ambulatory Status: Ambulatory Pulse (bpm): 69 Discharge Destination: Home Respiratory Rate (breaths/min): 20 Transportation: Private Auto Blood Pressure (mmHg): 132/72 Accompanied By: son Schedule Follow-up Appointment: Yes Clinical Summary of Care: Electronic Signature(s) Signed: 10/27/2021 5:13:32 PM By: Deon Pilling RN, BSN Entered By: Deon Pilling on 10/27/2021 14:01:42 -------------------------------------------------------------------------------- Lower Extremity Assessment Details Patient Name: Date of Service: Vicki Rhymes T. 10/27/2021 1:30 PM Medical Record Number: [id number] Patient Account Number: [id number] Date of Birth/Sex: Treating RN: [birth year] (88 y.o. Vicki [name], Meta.Reding Primary Care Monet North: Mortimer Fries Other Clinician: Referring Julietta Batterman: Treating Coyle Stordahl/Extender: Gust Rung in Treatment: 1 Edema Assessment Assessed: [Left: No] [Right: Yes] Edema: [Left: Ye] [Right: s] Calf Left: Right: Point of Measurement: 28 cm From Medial Instep 34.5 cm Ankle Left: Right: Point of Measurement: 12 cm From Medial Instep 22 cm Vascular Assessment Pulses: Dorsalis Pedis Palpable: [Right:Yes] Electronic Signature(s) Signed: 10/27/2021 4:41:46 PM By: Erenest Blank Signed: 10/27/2021 5:13:32 PM By: Deon Pilling RN, BSN Entered By: Erenest Blank on 10/27/2021 13:46:08 -------------------------------------------------------------------------------- Multi Wound Chart Details Patient Name: Date of Service: Vicki Rhymes T. 10/27/2021 1:30 PM Medical Record Number: [id number] Patient Account Number: [id number] Date of Birth/Sex: Treating RN: [birth year] (88 y.o. Vicki [name], Meta.Reding Primary Care Alessandra Sawdey: Mortimer Fries Other Clinician: Referring Antero Derosia: Treating Fatina Sprankle/Extender: Gust Rung in Treatment: 1 Vital  Signs Height(in): 62 Pulse(bpm): 64 Weight(lbs): 142 Blood Pressure(mmHg): 132/72 Body Mass Index(BMI): 26 Temperature(F): 98.1 Respiratory Rate(breaths/min): 18 Photos: [N/A:N/A] Right, Lateral Lower Leg N/A N/A Wound Location: Trauma N/A N/A Wounding Event: Abrasion N/A N/A Primary Etiology: Lymphedema N/A N/A Secondary Etiology: Congestive Heart Failure, N/A N/A Comorbid History: Hypertension, Osteoarthritis, Neuropathy 08/04/2021 N/A N/A Date Acquired: 1 N/A N/A Weeks of Treatment: Open N/A N/A Wound Status: No N/A N/A Wound Recurrence: 13.1x5.8x0.1 N/A N/A Measurements L x W x D (cm) 59.675 N/A N/A A (cm) : rea  5.967 N/A N/A Volume (cm) : -11.70% N/A N/A % Reduction in A rea: 77.70% N/A N/A % Reduction in Volume: Full Thickness Without Exposed N/A N/A Classification: Support Structures Large N/A N/A Exudate A mount: Serosanguineous N/A N/A Exudate Type: red, brown N/A N/A Exudate Color: Distinct, outline attached N/A N/A Wound Margin: Medium (34-66%) N/A N/A Granulation A mount: Red, Pink, Hyper-granulation N/A N/A Granulation Quality: Medium (34-66%) N/A N/A Necrotic A mount: Fat Layer (Subcutaneous Tissue): Yes N/A N/A Exposed Structures: Fascia: No Tendon: No Muscle: No Joint: No Bone: No Small (1-33%) N/A N/A Epithelialization: Debridement - Excisional N/A N/A Debridement: Pre-procedure Verification/Time Out 13:50 N/A N/A Taken: Lidocaine 5% topical ointment N/A N/A Pain Control: Subcutaneous, Slough N/A N/A Tissue Debrided: Skin/Subcutaneous Tissue N/A N/A Level: 75.98 N/A N/A Debridement A (sq cm): rea Curette N/A N/A Instrument: Minimum N/A N/A Bleeding: Pressure N/A N/A Hemostasis A chieved: 0 N/A N/A Procedural Pain: 0 N/A N/A Post Procedural Pain: Procedure was tolerated well N/A N/A Debridement Treatment Response: 13.1x5.8x0.1 N/A N/A Post Debridement Measurements L x W x D (cm) 5.967 N/A N/A Post  Debridement Volume: (cm) Debridement N/A N/A Procedures Performed: Treatment Notes Wound #1 (Lower Leg) Wound Laterality: Right, Lateral Cleanser Soap and Water Discharge Instruction: May shower and wash wound with dial antibacterial soap and water prior to dressing change. Wound Cleanser Discharge Instruction: Cleanse the wound with wound cleanser prior to applying a clean dressing using gauze sponges, not tissue or cotton balls. Peri-Wound Care Sween Lotion (Moisturizing lotion) Discharge Instruction: Apply moisturizing lotion as directed Topical Gentamicin Discharge Instruction: ***APPLY ONLY IN CLINIC.**** Mupirocin Ointment Discharge Instruction: Apply Mupirocin ONLY IN CLINIC**** Primary Dressing Hydrofera Blue Classic Foam, 4x4 in Discharge Instruction: Moisten with saline prior to applying to wound bed Secondary Dressing ABD Pad, 8x10 Discharge Instruction: Apply over primary dressing as directed. Woven Gauze Sponge, Non-Sterile 4x4 in Discharge Instruction: Apply over primary dressing as directed. Secured With Compression Wrap Kerlix Roll 4.5x3.1 (in/yd) Discharge Instruction: Apply Kerlix and Coban compression as directed. Coban Self-Adherent Wrap 4x5 (in/yd) Discharge Instruction: Apply over Kerlix as directed. Compression Stockings Add-Ons Electronic Signature(s) Signed: 10/27/2021 4:36:36 PM By: Kalman Shan DO Signed: 10/27/2021 5:13:32 PM By: Deon Pilling RN, BSN Entered By: Kalman Shan on 10/27/2021 14:34:16 -------------------------------------------------------------------------------- Multi-Disciplinary Care Plan Details Patient Name: Date of Service: Vicki Rhymes T. 10/27/2021 1:30 PM Medical Record Number: [id number] Patient Account Number: [id number] Date of Birth/Sex: Treating RN: Jun 04, 1923 (88 y.o. Vicki [name], Meta.Reding Primary Care Stephaun Million: Mortimer Fries Other Clinician: Referring Marceil Welp: Treating Yamilee Harmes/Extender: Gust Rung in Treatment: 1 Active Inactive Nutrition Nursing Diagnoses: Potential for alteratiion in Nutrition/Potential for imbalanced nutrition Goals: Patient/caregiver agrees to and verbalizes understanding of need to obtain nutritional consultation Date Initiated: 10/20/2021 Target Resolution Date: 11/06/2021 Goal Status: Active Interventions: Provide education on nutrition Treatment Activities: Education provided on Nutrition : 10/20/2021 Patient referred to Primary Care Physician for further nutritional evaluation : 10/20/2021 Notes: Pain, Acute or Chronic Nursing Diagnoses: Pain, acute or chronic: actual or potential Potential alteration in comfort, pain Goals: Patient will verbalize adequate pain control and receive pain control interventions during procedures as needed Date Initiated: 10/20/2021 Target Resolution Date: 11/06/2021 Goal Status: Active Patient/caregiver will verbalize comfort level met Date Initiated: 10/20/2021 Target Resolution Date: 11/06/2021 Goal Status: Active Interventions: Assess comfort goal upon admission Provide education on pain management Treatment Activities: Administer pain control measures as ordered : 10/20/2021 Notes: Wound/Skin Impairment Nursing Diagnoses: Knowledge deficit related to ulceration/compromised skin  integrity Goals: Patient/caregiver will verbalize understanding of skin care regimen Date Initiated: 10/20/2021 Target Resolution Date: 11/06/2021 Goal Status: Active Interventions: Assess patient/caregiver ability to perform ulcer/skin care regimen upon admission and as needed Assess ulceration(s) every visit Provide education on ulcer and skin care Treatment Activities: Skin care regimen initiated : 10/20/2021 Topical wound management initiated : 10/20/2021 Notes: Electronic Signature(s) Signed: 10/27/2021 5:13:32 PM By: Deon Pilling RN, BSN Entered By: Deon Pilling on 10/27/2021  13:56:17 -------------------------------------------------------------------------------- Pain Assessment Details Patient Name: Date of Service: Vicki Rhymes T. 10/27/2021 1:30 PM Medical Record Number: [id number] Patient Account Number: [id number] Date of Birth/Sex: Treating RN: 05-13-1923 (88 y.o. Vicki [name], [name] Primary Care Ifeanyi Mickelson: Mortimer Fries Other Clinician: Referring Sitara Cashwell: Treating Adien Kimmel/Extender: Gust Rung in Treatment: 1 Active Problems Location of Pain Severity and Description of Pain Patient Has Paino No Site Locations Pain Management and Medication Current Pain Management: Electronic Signature(s) Signed: 10/27/2021 4:41:46 PM By: Erenest Blank Signed: 10/27/2021 5:13:32 PM By: Deon Pilling RN, BSN Entered By: Erenest Blank on 10/27/2021 13:40:48 -------------------------------------------------------------------------------- Patient/Caregiver Education Details Patient Name: Date of Service: Vicki [name] 8/28/2023andnbsp1:30 PM Medical Record Number: [id number] Patient Account Number: [id number] Date of Birth/Gender: Treating RN: [birth year] (88 y.o. [name] Primary Care Physician: Mortimer Fries Other Clinician: Referring Physician: Treating Physician/Extender: Gust Rung in Treatment: 1 Education Assessment Education Provided To: Patient Education Topics Provided Wound/Skin Impairment: Handouts: Skin Care Do's and Dont's Methods: Explain/Verbal Responses: Reinforcements needed Electronic Signature(s) Signed: 10/27/2021 5:13:32 PM By: Deon Pilling RN, BSN Entered By: Deon Pilling on 10/27/2021 13:56:26 -------------------------------------------------------------------------------- Wound Assessment Details Patient Name: Date of Service: Vicki Rhymes T. 10/27/2021 1:30 PM Medical Record Number: [id number] Patient Account Number: [id number] Date of  Birth/Sex: Treating RN: 12/06/1923 (88 y.o. Vicki [name], Meta.Reding Primary Care Meyli Boice: Mortimer Fries Other Clinician: Referring Myan Locatelli: Treating Joni Colegrove/Extender: Gust Rung in Treatment: 1 Wound Status Wound Number: 1 Primary Etiology: Abrasion Wound Location: Right, Lateral Lower Leg Secondary Lymphedema Etiology: Wounding Event: Trauma Wound Status: Open Date Acquired: 08/04/2021 Comorbid Congestive Heart Failure, Hypertension, Osteoarthritis, Weeks Of Treatment: 1 History: Neuropathy Clustered Wound: No Photos Wound Measurements Length: (cm) 13.1 Width: (cm) 5.8 Depth: (cm) 0.1 Area: (cm) 59.675 Volume: (cm) 5.967 % Reduction in Area: -11.7% % Reduction in Volume: 77.7% Epithelialization: Small (1-33%) Tunneling: No Undermining: No Wound Description Classification: Full Thickness Without Exposed Support Structures Wound Margin: Distinct, outline attached Exudate Amount: Large Exudate Type: Serosanguineous Exudate Color: red, brown Foul Odor After Cleansing: No Slough/Fibrino Yes Wound Bed Granulation Amount: Medium (34-66%) Exposed Structure Granulation Quality: Red, Pink, Hyper-granulation Fascia Exposed: No Necrotic Amount: Medium (34-66%) Fat Layer (Subcutaneous Tissue) Exposed: Yes Necrotic Quality: Adherent Slough Tendon Exposed: No Muscle Exposed: No Joint Exposed: No Bone Exposed: No Treatment Notes Wound #1 (Lower Leg) Wound Laterality: Right, Lateral Cleanser Soap and Water Discharge Instruction: May shower and wash wound with dial antibacterial soap and water prior to dressing change. Wound Cleanser Discharge Instruction: Cleanse the wound with wound cleanser prior to applying a clean dressing using gauze sponges, not tissue or cotton balls. Peri-Wound Care Sween Lotion (Moisturizing lotion) Discharge Instruction: Apply moisturizing lotion as directed Topical Gentamicin Discharge Instruction: ***APPLY ONLY IN  CLINIC.**** Mupirocin Ointment Discharge Instruction: Apply Mupirocin ONLY IN CLINIC**** Primary Dressing Hydrofera Blue Classic Foam, 4x4 in Discharge Instruction: Moisten with saline prior to applying to wound bed Secondary Dressing ABD Pad, 8x10 Discharge Instruction: Apply  over primary dressing as directed. Woven Gauze Sponge, Non-Sterile 4x4 in Discharge Instruction: Apply over primary dressing as directed. Secured With Compression Wrap Kerlix Roll 4.5x3.1 (in/yd) Discharge Instruction: Apply Kerlix and Coban compression as directed. Coban Self-Adherent Wrap 4x5 (in/yd) Discharge Instruction: Apply over Kerlix as directed. Compression Stockings Add-Ons Electronic Signature(s) Signed: 10/27/2021 5:13:32 PM By: Deon Pilling RN, BSN Entered By: Deon Pilling on 10/27/2021 13:50:45 -------------------------------------------------------------------------------- Vitals Details Patient Name: Date of Service: Vicki Rhymes T. 10/27/2021 1:30 PM Medical Record Number: [id number] Patient Account Number: [id number] Date of Birth/Sex: Treating RN: August 27, 1923 (88 y.o. Vicki [name], Meta.Reding Primary Care Trayven Lumadue: Mortimer Fries Other Clinician: Referring Jovante Hammitt: Treating Alecsander Hattabaugh/Extender: Gust Rung in Treatment: 1 Vital Signs Time Taken: 13:38 Temperature (F): 98.1 Height (in): 62 Pulse (bpm): 69 Weight (lbs): 142 Respiratory Rate (breaths/min): 18 Body Mass Index (BMI): 26 Blood Pressure (mmHg): 132/72 Reference Range: 80 - 120 mg / dl Electronic Signature(s) Signed: 10/27/2021 4:41:46 PM By: Erenest Blank Entered By: Erenest Blank on 10/27/2021 13:40:42

## 2021-10-27 NOTE — Progress Notes (Signed)
Vicki [name], Vicki [name] ([id number]) . Visit Report for 10/27/2021 Chief Complaint Document Details Patient Name: Date of Service: Vicki [name], Vicki [name]. 10/27/2021 1:30 PM Medical Record Number: [id number] Patient Account Number: [id number] Date of Birth/Sex: Treating RN: 18-Mar-1923 (88 y.o. Vicki [name], Vicki [name] Primary Care Provider: Sonia Side Other Clinician: Referring Provider: Treating Provider/Extender: Radene Ou in Treatment: 1 Information Obtained from: Patient Chief Complaint 10/20/2021; right lower extremity wound status post trauma Electronic Signature(s) Signed: 10/27/2021 4:36:36 PM By: Geralyn Corwin DO Entered By: Geralyn Corwin on 10/27/2021 14:34:23 -------------------------------------------------------------------------------- Debridement Details Patient Name: Date of Service: Vicki [name]. 10/27/2021 1:30 PM Medical Record Number: [id number] Patient Account Number: [id number] Date of Birth/Sex: Treating RN: 07/31/1923 (88 y.o. Vicki [name], Vicki [name] Primary Care Provider: Sonia Side Other Clinician: Referring Provider: Treating Provider/Extender: Radene Ou in Treatment: 1 Debridement Performed for Assessment: Wound #1 Right,Lateral Lower Leg Performed By: Physician Geralyn Corwin, DO Debridement Type: Debridement Level of Consciousness (Pre-procedure): Awake and Alert Pre-procedure Verification/Time Out Yes - 13:50 Taken: Start Time: 13:51 Pain Control: Lidocaine 5% topical ointment [name] Area Debrided (L x W): otal 13.1 (cm) x 5.8 (cm) = 75.98 (cm) Tissue and other material debrided: Viable, Slough, Subcutaneous, Slough, Hyper-granulation Level: Skin/Subcutaneous Tissue Debridement Description: Excisional Instrument: Curette Bleeding: Minimum Hemostasis Achieved: Pressure End Time: 14:00 Procedural Pain: 0 Post Procedural Pain: 0 Response to Treatment: Procedure was tolerated well Level of  Consciousness (Post- Awake and Alert procedure): Post Debridement Measurements of Total Wound Length: (cm) 13.1 Width: (cm) 5.8 Depth: (cm) 0.1 Volume: (cm) 5.967 Character of Wound/Ulcer Post Debridement: Requires Further Debridement Post Procedure Diagnosis Same as Pre-procedure Electronic Signature(s) Signed: 10/27/2021 4:36:36 PM By: Geralyn Corwin DO Signed: 10/27/2021 5:13:32 PM By: Shawn Stall RN, BSN Entered By: Shawn Stall on 10/27/2021 14:00:43 -------------------------------------------------------------------------------- HPI Details Patient Name: Date of Service: Vicki [name], Vicki [name]. 10/27/2021 1:30 PM Medical Record Number: [id number] Patient Account Number: [id number] Date of Birth/Sex: Treating RN: Jul 18, 1923 (88 y.o. Vicki [name] Primary Care Provider: Sonia Side Other Clinician: Referring Provider: Treating Provider/Extender: Radene Ou in Treatment: 1 History of Present Illness HPI Description: Admission 10/20/2021 Vicki [name] is a 88 year old female with a past medical history of hypothyroidism and essential hypertension that presents to the clinic for a 80-month history of ulcer to the right lower extremity. On 08/15/2021 the patient experienced a mechanical fall and hit her right leg against her table. She developed a hematoma and she was evaluated in the ED for this issue. The hematoma was evacuated in the ED. She had x-rays of the tibia/fibula and ankle that showed no acute osseous abnormalities. She has been using silver alginate to the wound bed. She did have 2 rounds of doxycycline for this issue and recently completed her second course. She currently denies signs of infection. 8/28; patient presents for follow-up. She has no issues or complaints today. We have been doing Santyl and Hydrofera Blue under Kerlix/Coban. She has home health that they start coming out once weekly. She denies signs of  infection. Electronic Signature(s) Signed: 10/27/2021 4:36:36 PM By: Geralyn Corwin DO Entered By: Geralyn Corwin on 10/27/2021 15:05:42 -------------------------------------------------------------------------------- Physical Exam Details Patient Name: Date of Service: Vicki [name]. 10/27/2021 1:30 PM Medical Record Number: [id number] Patient Account Number: [id number] Date of Birth/Sex: Treating RN: [birth year] (88 y.o. Vicki [name] Primary Care Provider: Sonia Side Other Clinician: Referring Provider: Treating Provider/Extender:  Council Mechanic, Richard Y Weeks in Treatment: 1 Constitutional respirations regular, non-labored and within target range for patient.. Cardiovascular 2+ dorsalis pedis/posterior tibialis pulses. Psychiatric pleasant and cooperative. Notes Right lower extremity: [name] the anterior aspect there is a large open wound with hyper granulated tissue, granulation tissue and nonviable tissue. No tunneling or o undermining noted. No increased warmth, erythema or purulent drainage noted. Venous stasis dermatitis. Electronic Signature(s) Signed: 10/27/2021 4:36:36 PM By: Geralyn Corwin DO Signed: 10/27/2021 4:36:36 PM By: Geralyn Corwin DO Entered By: Geralyn Corwin on 10/27/2021 15:06:06 -------------------------------------------------------------------------------- Physician Orders Details Patient Name: Date of Service: Vicki [name]. 10/27/2021 1:30 PM Medical Record Number: [id number] Patient Account Number: [id number] Date of Birth/Sex: Treating RN: [birth year] (88 y.o. Vicki [name] Primary Care Provider: Sonia Side Other Clinician: Referring Provider: Treating Provider/Extender: Radene Ou in Treatment: 1 Verbal / Phone Orders: No Diagnosis Coding ICD-10 Coding Code Description [phone] Non-pressure chronic ulcer of other part of right lower leg with fat layer exposed T79.8XXA Other  early complications of trauma, initial encounter E03.9 Hypothyroidism, unspecified I87.311 Chronic venous hypertension (idiopathic) with ulcer of right lower extremity Follow-up Appointments ppointment in 1 week. - Dr. Mikey Bussing and [place], Room 8 1115 am 11/04/2021 Tuesday Return A ppointment in 2 weeks. - Dr. Mikey Bussing and [place], Room 8 3pm 11/10/2021 Monday Return A Anesthetic (In clinic) Topical Lidocaine 4% applied to wound bed Bathing/ Shower/ Hygiene May shower with protection but do not get wound dressing(s) wet. Edema Control - Lymphedema / SCD / Other Elevate legs to the level of the heart or above for 30 minutes daily and/or when sitting, a frequency of: - 3-4 times a day throughout the day. Avoid standing for long periods of time. Exercise regularly Home Health New wound care orders this week; continue Home Health for wound care. May utilize formulary equivalent dressing for wound treatment orders unless otherwise specified. - home health to change once a week. wound center weekly. Hydrofera blue, abd pad, and kerlix coban as compression to right leg. Other Home Health Orders/Instructions: [facility] Wound Treatment Wound #1 - Lower Leg Wound Laterality: Right, Lateral Cleanser: Soap and Water (Home Health) 2 x Per Week/30 Days Discharge Instructions: May shower and wash wound with dial antibacterial soap and water prior to dressing change. Cleanser: Wound Cleanser (Home Health) 2 x Per Week/30 Days Discharge Instructions: Cleanse the wound with wound cleanser prior to applying a clean dressing using gauze sponges, not tissue or cotton balls. Peri-Wound Care: Sween Lotion (Moisturizing lotion) (Home Health) 2 x Per Week/30 Days Discharge Instructions: Apply moisturizing lotion as directed Topical: Gentamicin 2 x Per Week/30 Days Discharge Instructions: ***APPLY ONLY IN CLINIC.**** Topical: Mupirocin Ointment 2 x Per Week/30 Days Discharge Instructions: Apply Mupirocin  ONLY IN CLINIC**** Prim Dressing: Hydrofera Blue Classic Foam, 4x4 in (Home Health) 2 x Per Week/30 Days ary Discharge Instructions: Moisten with saline prior to applying to wound bed Secondary Dressing: ABD Pad, 8x10 (Home Health) 2 x Per Week/30 Days Discharge Instructions: Apply over primary dressing as directed. Secondary Dressing: Woven Gauze Sponge, Non-Sterile 4x4 in (Home Health) 2 x Per Week/30 Days Discharge Instructions: Apply over primary dressing as directed. Compression Wrap: Kerlix Roll 4.5x3.1 (in/yd) (Home Health) 2 x Per Week/30 Days Discharge Instructions: Apply Kerlix and Coban compression as directed. Compression Wrap: Coban Self-Adherent Wrap 4x5 (in/yd) (Home Health) 2 x Per Week/30 Days Discharge Instructions: Apply over Kerlix as directed. Electronic Signature(s) Signed: 10/27/2021 4:36:36 PM  By: Geralyn Corwin DO Entered By: Geralyn Corwin on 10/27/2021 15:06:15 -------------------------------------------------------------------------------- Problem List Details Patient Name: Date of Service: Vicki [name]. 10/27/2021 1:30 PM Medical Record Number: [id number] Patient Account Number: [id number] Date of Birth/Sex: Treating RN: [birth year] (88 y.o. Vicki [name] Primary Care Provider: Sonia Side Other Clinician: Referring Provider: Treating Provider/Extender: Radene Ou in Treatment: 1 Active Problems ICD-10 Encounter Code Description Active Date MDM Diagnosis [id number] Non-pressure chronic ulcer of other part of right lower leg with fat layer 10/20/2021 No Yes exposed T79.8XXA Other early complications of trauma, initial encounter 10/20/2021 No Yes E03.9 Hypothyroidism, unspecified 10/20/2021 No Yes I87.311 Chronic venous hypertension (idiopathic) with ulcer of right lower extremity 10/20/2021 No Yes Inactive Problems Resolved Problems Electronic Signature(s) Signed: 10/27/2021 4:36:36 PM By: Geralyn Corwin  DO Entered By: Geralyn Corwin on 10/27/2021 14:34:08 -------------------------------------------------------------------------------- Progress Note Details Patient Name: Date of Service: Vicki [name]. 10/27/2021 1:30 PM Medical Record Number: [id number] Patient Account Number: [id number] Date of Birth/Sex: Treating RN: October 21, 1923 (88 y.o. Vicki [name] Primary Care Provider: Sonia Side Other Clinician: Referring Provider: Treating Provider/Extender: Radene Ou in Treatment: 1 Subjective Chief Complaint Information obtained from Patient 10/20/2021; right lower extremity wound status post trauma History of Present Illness (HPI) Admission 10/20/2021 Ms. Remas Sobel is a 88 year old female with a past medical history of hypothyroidism and essential hypertension that presents to the clinic for a 9-month history of ulcer to the right lower extremity. On 08/15/2021 the patient experienced a mechanical fall and hit her right leg against her table. She developed a hematoma and she was evaluated in the ED for this issue. The hematoma was evacuated in the ED. She had x-rays of the tibia/fibula and ankle that showed no acute osseous abnormalities. She has been using silver alginate to the wound bed. She did have 2 rounds of doxycycline for this issue and recently completed her second course. She currently denies signs of infection. 8/28; patient presents for follow-up. She has no issues or complaints today. We have been doing Santyl and Hydrofera Blue under Kerlix/Coban. She has home health that they start coming out once weekly. She denies signs of infection. Patient History Information obtained from Patient. Family History Diabetes - Child, Heart Disease - Child,Mother,Father,Siblings, Hypertension - Father,Mother, No family history of Cancer, Hereditary Spherocytosis, Kidney Disease, Lung Disease, Seizures, Stroke, Thyroid Problems,  Tuberculosis. Social History Never smoker, Marital Status - Widowed, Alcohol Use - Never, Drug Use - No History, Caffeine Use - Rarely. Medical History Eyes Denies history of Cataracts, Glaucoma, Optic Neuritis Ear/Nose/Mouth/Throat Denies history of Chronic sinus problems/congestion, Middle ear problems Hematologic/Lymphatic Denies history of Anemia, Hemophilia, Human Immunodeficiency Virus, Lymphedema, Sickle Cell Disease Respiratory Denies history of Aspiration, Asthma, Chronic Obstructive Pulmonary Disease (COPD), Pneumothorax, Sleep Apnea, Tuberculosis Cardiovascular Patient has history of Congestive Heart Failure, Hypertension Denies history of Angina, Arrhythmia, Coronary Artery Disease, Deep Vein Thrombosis, Hypotension, Myocardial Infarction, Peripheral Arterial Disease, Peripheral Venous Disease, Phlebitis, Vasculitis Gastrointestinal Denies history of Cirrhosis , Colitis, Crohnoos, Hepatitis A, Hepatitis B, Hepatitis C Endocrine Denies history of Type I Diabetes, Type II Diabetes Immunological Denies history of Lupus Erythematosus, Raynaudoos, Scleroderma Integumentary (Skin) Denies history of History of Burn Musculoskeletal Patient has history of Osteoarthritis Denies history of Gout, Rheumatoid Arthritis, Osteomyelitis Neurologic Patient has history of Neuropathy Denies history of Dementia, Quadriplegia, Paraplegia, Seizure Disorder Psychiatric Denies history of Anorexia/bulimia, Confinement Anxiety Hospitalization/Surgery History - back surgery. - right hip surgery. -  hysterotomy. Medical A Surgical History Notes nd Ear/Nose/Mouth/Throat hearing aides Genitourinary Stage three Objective Constitutional respirations regular, non-labored and within target range for patient.. Vitals Time Taken: 1:38 PM, Height: 62 in, Weight: 142 lbs, BMI: 26, Temperature: 98.1 F, Pulse: 69 bpm, Respiratory Rate: 18 breaths/min, Blood Pressure: 132/72 mmHg. Cardiovascular 2+  dorsalis pedis/posterior tibialis pulses. Psychiatric pleasant and cooperative. General Notes: Right lower extremity: [name] the anterior aspect there is a large open wound with hyper granulated tissue, granulation tissue and nonviable tissue. o No tunneling or undermining noted. No increased warmth, erythema or purulent drainage noted. Venous stasis dermatitis. Integumentary (Hair, Skin) Wound #1 status is Open. Original cause of wound was Trauma. The date acquired was: 08/04/2021. The wound has been in treatment 1 weeks. The wound is located on the Right,Lateral Lower Leg. The wound measures 13.1cm length x 5.8cm width x 0.1cm depth; 59.675cm^2 area and 5.967cm^3 volume. There is Fat Layer (Subcutaneous Tissue) exposed. There is no tunneling or undermining noted. There is a large amount of serosanguineous drainage noted. The wound margin is distinct with the outline attached to the wound base. There is medium (34-66%) red, pink, hyper - granulation within the wound bed. There is a medium (34-66%) amount of necrotic tissue within the wound bed including Adherent Slough. Assessment Active Problems ICD-10 Non-pressure chronic ulcer of other part of right lower leg with fat layer exposed Other early complications of trauma, initial encounter Hypothyroidism, unspecified Chronic venous hypertension (idiopathic) with ulcer of right lower extremity Patient's wound has shown improvement in size and appearance since last clinic visit. I debrided nonviable tissue and use silver nitrate to the hyper granulated areas. I recommended continuing the course with Santyl and Hydrofera Blue under Kerlix/Coban. Procedures Wound #1 Pre-procedure diagnosis of Wound #1 is an Abrasion located on the Right,Lateral Lower Leg . There was a Excisional Skin/Subcutaneous Tissue Debridement with a total area of 75.98 sq cm performed by Geralyn Corwin, DO. With the following instrument(s): Curette to remove Viable  tissue/material. Material removed includes Subcutaneous Tissue, Slough, and Hyper-granulation after achieving pain control using Lidocaine 5% topical ointment. A time out was conducted at 13:50, prior to the start of the procedure. A Minimum amount of bleeding was controlled with Pressure. The procedure was tolerated well with a pain level of 0 throughout and a pain level of 0 following the procedure. Post Debridement Measurements: 13.1cm length x 5.8cm width x 0.1cm depth; 5.967cm^3 volume. Character of Wound/Ulcer Post Debridement requires further debridement. Post procedure Diagnosis Wound #1: Same as Pre-Procedure Plan Follow-up Appointments: Return Appointment in 1 week. - Dr. Mikey Bussing and [place], Room 8 1115 am 11/04/2021 Tuesday Return Appointment in 2 weeks. - Dr. Mikey Bussing and [place], Room 8 3pm 11/10/2021 Monday Anesthetic: (In clinic) Topical Lidocaine 4% applied to wound bed Bathing/ Shower/ Hygiene: May shower with protection but do not get wound dressing(s) wet. Edema Control - Lymphedema / SCD / Other: Elevate legs to the level of the heart or above for 30 minutes daily and/or when sitting, a frequency of: - 3-4 times a day throughout the day. Avoid standing for long periods of time. Exercise regularly Home Health: New wound care orders this week; continue Home Health for wound care. May utilize formulary equivalent dressing for wound treatment orders unless otherwise specified. - home health to change once a week. wound center weekly. Hydrofera blue, abd pad, and kerlix coban as compression to right leg. Other Home Health Orders/Instructions: [facility] WOUND #1: - Lower Leg Wound Laterality:  Right, Lateral Cleanser: Soap and Water [facility](Home Health) 2 x Per Week/30 Days Discharge Instructions: May shower and wash wound with dial antibacterial soap and water prior to dressing change. Cleanser: Wound Cleanser (Home Health) 2 x Per Week/30 Days Discharge Instructions: Cleanse  the wound with wound cleanser prior to applying a clean dressing using gauze sponges, not tissue or cotton balls. Peri-Wound Care: Sween Lotion (Moisturizing lotion) (Home Health) 2 x Per Week/30 Days Discharge Instructions: Apply moisturizing lotion as directed Topical: Gentamicin 2 x Per Week/30 Days Discharge Instructions: ***APPLY ONLY IN CLINIC.**** Topical: Mupirocin Ointment 2 x Per Week/30 Days Discharge Instructions: Apply Mupirocin ONLY IN CLINIC**** Prim Dressing: Hydrofera Blue Classic Foam, 4x4 in (Home Health) 2 x Per Week/30 Days ary Discharge Instructions: Moisten with saline prior to applying to wound bed Secondary Dressing: ABD Pad, 8x10 (Home Health) 2 x Per Week/30 Days Discharge Instructions: Apply over primary dressing as directed. Secondary Dressing: Woven Gauze Sponge, Non-Sterile 4x4 in (Home Health) 2 x Per Week/30 Days Discharge Instructions: Apply over primary dressing as directed. Compression Wrap: Kerlix Roll 4.5x3.1 (in/yd) (Home Health) 2 x Per Week/30 Days Discharge Instructions: Apply Kerlix and Coban compression as directed. Compression Wrap: Coban Self-Adherent Wrap 4x5 (in/yd) (Home Health) 2 x Per Week/30 Days Discharge Instructions: Apply over Kerlix as directed. 1. In office sharp debridement 2. Santyl and Hydrofera Blue under Kerlix/Coban 3. Follow-up in 1 week Electronic Signature(s) Signed: 10/27/2021 4:36:36 PM By: Geralyn CorwinHoffman, Kayline Sheer DO Entered By: Geralyn CorwinHoffman, Denya Buckingham on 10/27/2021 15:08:02 -------------------------------------------------------------------------------- HxROS Details Patient Name: Date of Service: Vicki PresumeA LDERMA N, Doralyn [name]. 10/27/2021 1:30 PM Medical Record Number: [id number] Patient Account Number: [id number] Date of Birth/Sex: Treating RN: December 19, 1923 (88 y.o. Vicki SilenceF) Vicki [name], Vicki [name] Primary Care Provider: Sonia SidePang, Richard Y Other Clinician: Referring Provider: Treating Provider/Extender: Radene OuHoffman, Maryum Batterson Pang, Richard Y Weeks in Treatment:  1 Information Obtained From Patient Eyes Medical History: Negative for: Cataracts; Glaucoma; Optic Neuritis Ear/Nose/Mouth/Throat Medical History: Negative for: Chronic sinus problems/congestion; Middle ear problems Past Medical History Notes: hearing aides Hematologic/Lymphatic Medical History: Negative for: Anemia; Hemophilia; Human Immunodeficiency Virus; Lymphedema; Sickle Cell Disease Respiratory Medical History: Negative for: Aspiration; Asthma; Chronic Obstructive Pulmonary Disease (COPD); Pneumothorax; Sleep Apnea; Tuberculosis Cardiovascular Medical History: Positive for: Congestive Heart Failure; Hypertension Negative for: Angina; Arrhythmia; Coronary Artery Disease; Deep Vein Thrombosis; Hypotension; Myocardial Infarction; Peripheral Arterial Disease; Peripheral Venous Disease; Phlebitis; Vasculitis Gastrointestinal Medical History: Negative for: Cirrhosis ; Colitis; Crohns; Hepatitis A; Hepatitis B; Hepatitis C Endocrine Medical History: Negative for: Type I Diabetes; Type II Diabetes Genitourinary Medical History: Past Medical History Notes: Stage three Immunological Medical History: Negative for: Lupus Erythematosus; Raynauds; Scleroderma Integumentary (Skin) Medical History: Negative for: History of Burn Musculoskeletal Medical History: Positive for: Osteoarthritis Negative for: Gout; Rheumatoid Arthritis; Osteomyelitis Neurologic Medical History: Positive for: Neuropathy Negative for: Dementia; Quadriplegia; Paraplegia; Seizure Disorder Psychiatric Medical History: Negative for: Anorexia/bulimia; Confinement Anxiety Immunizations Pneumococcal Vaccine: Received Pneumococcal Vaccination: Yes Received Pneumococcal Vaccination On or After 60th Birthday: Yes Implantable Devices Yes Hospitalization / Surgery History Type of Hospitalization/Surgery back surgery right hip surgery hysterotomy Family and Social History Cancer: No; Diabetes: Yes -  Child; Heart Disease: Yes - Child,Mother,Father,Siblings; Hereditary Spherocytosis: No; Hypertension: Yes - Father,Mother; Kidney Disease: No; Lung Disease: No; Seizures: No; Stroke: No; Thyroid Problems: No; Tuberculosis: No; Never smoker; Marital Status - Widowed; Alcohol Use: Never; Drug Use: No History; Caffeine Use: Rarely; Financial Concerns: No; Food, Clothing or Shelter Needs: No; Support System Lacking: No; Transportation Concerns: No Electronic Signature(s) Signed: 10/27/2021 4:36:36 PM By:  Geralyn Corwin DO Signed: 10/27/2021 5:13:32 PM By: Shawn Stall RN, BSN Entered By: Geralyn Corwin on 10/27/2021 15:05:48 -------------------------------------------------------------------------------- SuperBill Details Patient Name: Date of Service: Vicki [name]. 10/27/2021 Medical Record Number: [id number] Patient Account Number: [id number] Date of Birth/Sex: Treating RN: [birth year] (88 y.o. Vicki [name] Primary Care Provider: Sonia Side Other Clinician: Referring Provider: Treating Provider/Extender: Radene Ou in Treatment: 1 Diagnosis Coding ICD-10 Codes Code Description [phone] Non-pressure chronic ulcer of other part of right lower leg with fat layer exposed T79.8XXA Other early complications of trauma, initial encounter E03.9 Hypothyroidism, unspecified I87.311 Chronic venous hypertension (idiopathic) with ulcer of right lower extremity Facility Procedures CPT4 Code: [id number] Description: 11042 - DEB SUBQ TISSUE 20 SQ CM/< ICD-10 Diagnosis Description L97.812 Non-pressure chronic ulcer of other part of right lower leg with fat layer exp Modifier: osed Quantity: 1 CPT4 Code: [id number] Description: 11045 - DEB SUBQ TISS EA ADDL 20CM ICD-10 Diagnosis Description L97.812 Non-pressure chronic ulcer of other part of right lower leg with fat layer exp Modifier: osed Quantity: 3 Physician Procedures : CPT4 Code Description Modifier  [id number] 11042 - WC PHYS SUBQ TISS 20 SQ CM ICD-10 Diagnosis Description L97.812 Non-pressure chronic ulcer of other part of right lower leg with fat layer exposed Quantity: 1 : [id number] 11045 - WC PHYS SUBQ TISS EA ADDL 20 CM ICD-10 Diagnosis Description L97.812 Non-pressure chronic ulcer of other part of right lower leg with fat layer exposed Quantity: 3 Electronic Signature(s) Signed: 10/27/2021 4:36:36 PM By: Geralyn Corwin DO Entered By: Geralyn Corwin on 10/27/2021 15:08:22

## 2021-11-04 ENCOUNTER — Encounter (HOSPITAL_BASED_OUTPATIENT_CLINIC_OR_DEPARTMENT_OTHER): Payer: Medicare Other | Attending: Internal Medicine | Admitting: Internal Medicine

## 2021-11-04 DIAGNOSIS — I10 Essential (primary) hypertension: Secondary | ICD-10-CM | POA: Diagnosis not present

## 2021-11-04 DIAGNOSIS — L97812 Non-pressure chronic ulcer of other part of right lower leg with fat layer exposed: Secondary | ICD-10-CM

## 2021-11-04 DIAGNOSIS — E039 Hypothyroidism, unspecified: Secondary | ICD-10-CM | POA: Diagnosis not present

## 2021-11-04 DIAGNOSIS — W01198A Fall on same level from slipping, tripping and stumbling with subsequent striking against other object, initial encounter: Secondary | ICD-10-CM | POA: Diagnosis not present

## 2021-11-04 DIAGNOSIS — T798XXA Other early complications of trauma, initial encounter: Secondary | ICD-10-CM | POA: Insufficient documentation

## 2021-11-04 DIAGNOSIS — I87331 Chronic venous hypertension (idiopathic) with ulcer and inflammation of right lower extremity: Secondary | ICD-10-CM | POA: Diagnosis present

## 2021-11-04 DIAGNOSIS — I87311 Chronic venous hypertension (idiopathic) with ulcer of right lower extremity: Secondary | ICD-10-CM | POA: Insufficient documentation

## 2021-11-04 NOTE — Progress Notes (Signed)
Vicki [name], Vicki [name] ([id number]) . Visit Report for 11/04/2021 Arrival Information Details Patient Name: Date of Service: Vicki [name], Vicki [name] 11/04/2021 11:15 A M Medical Record Number: [id number] Patient Account Number: [id number] Date of Birth/Sex: Treating RN: 07/27/1923 (88 y.o. Vicki [name], Vicki [name] Primary Care Galileo Colello: Mortimer Fries Other Clinician: Referring Shaneice Barsanti: Treating Denay Pleitez/Extender: Gust Rung in Treatment: 2 Visit Information History Since Last Visit Added or deleted any medications: No Patient Arrived: Vicki [name] Any new allergies or adverse reactions: No Arrival Time: 11:18 Had a fall or experienced change in No Accompanied By: son activities of daily living that may affect Transfer Assistance: Manual risk of falls: Patient Identification Verified: Yes Signs or symptoms of abuse/neglect since last visito No Secondary Verification Process Completed: Yes Hospitalized since last visit: No Patient Requires Transmission-Based Precautions: No Implantable device outside of the clinic excluding No Patient Has Alerts: No cellular tissue based products placed in the center since last visit: Has Dressing in Place as Prescribed: Yes Pain Present Now: No Electronic Signature(s) Signed: 11/04/2021 4:25:50 PM By: Rhae Hammock RN Entered By: Rhae Hammock on 11/04/2021 11:18:41 -------------------------------------------------------------------------------- Encounter Discharge Information Details Patient Name: Date of Service: Vicki Rhymes [name]. 11/04/2021 11:15 A M Medical Record Number: [id number] Patient Account Number: [id number] Date of Birth/Sex: Treating RN: 05/05/1923 (88 y.o. Vicki [name], Vicki [name] Primary Care Maison Kestenbaum: Mortimer Fries Other Clinician: Referring Sherolyn Trettin: Treating Michail Boyte/Extender: Gust Rung in Treatment: 2 Encounter Discharge Information Items Post Procedure Vitals Discharge Condition:  Stable Temperature (F): 98.7 Ambulatory Status: Walker Pulse (bpm): 77 Discharge Destination: Home Respiratory Rate (breaths/min): 20 Transportation: Private Auto Blood Pressure (mmHg): 154/74 Accompanied By: son Schedule Follow-up Appointment: Yes Clinical Summary of Care: Electronic Signature(s) Signed: 11/04/2021 6:08:07 PM By: Deon Pilling RN, BSN Entered By: Deon Pilling on 11/04/2021 11:46:09 -------------------------------------------------------------------------------- Lower Extremity Assessment Details Patient Name: Date of Service: Vicki Rhymes [name]. 11/04/2021 11:15 A M Medical Record Number: [id number] Patient Account Number: [id number] Date of Birth/Sex: Treating RN: [birth year] (88 y.o. Vicki [name], Vicki [name] Primary Care Agness Sibrian: Mortimer Fries Other Clinician: Referring Guiselle Mian: Treating Shekira Drummer/Extender: Gust Rung in Treatment: 2 Edema Assessment Assessed: [Left: No] [Right: No] Edema: [Left: Ye] [Right: s] Calf Left: Right: Point of Measurement: 28 cm From Medial Instep 33 cm Ankle Left: Right: Point of Measurement: 12 cm From Medial Instep 20.5 cm Vascular Assessment Pulses: Dorsalis Pedis Palpable: [Right:Yes] Posterior Tibial Palpable: [Right:Yes] Electronic Signature(s) Signed: 11/04/2021 4:25:50 PM By: Rhae Hammock RN Entered By: Rhae Hammock on 11/04/2021 11:29:28 -------------------------------------------------------------------------------- Multi Wound Chart Details Patient Name: Date of Service: Vicki Rhymes [name]. 11/04/2021 11:15 A M Medical Record Number: [id number] Patient Account Number: [id number] Date of Birth/Sex: Treating RN: 1923-08-21 (88 y.o. Vicki [name], Vicki [name] Primary Care Salman Wellen: Mortimer Fries Other Clinician: Referring Jung Ingerson: Treating Kellee Sittner/Extender: Gust Rung in Treatment: 2 Vital Signs Height(in): 62 Pulse(bpm): 33 Weight(lbs): 142 Blood  Pressure(mmHg): 154/74 Body Mass Index(BMI): 26 Temperature(F): 98.7 Respiratory Rate(breaths/min): 17 Photos: [1:Right, Lateral Lower Leg] [N/A:N/A N/A] Wound Location: [1:Trauma] [N/A:N/A] Wounding Event: [1:Abrasion] [N/A:N/A] Primary Etiology: [1:Lymphedema] [N/A:N/A] Secondary Etiology: [1:Congestive Heart Failure,] [N/A:N/A] Comorbid History: [1:Hypertension, Osteoarthritis, Neuropathy 08/04/2021] [N/A:N/A] Date Acquired: [1:2] [N/A:N/A] Weeks of Treatment: [1:Open] [N/A:N/A] Wound Status: [1:No] [N/A:N/A] Wound Recurrence: [1:12.2x5x0.1] [N/A:N/A] Measurements L x W x D (cm) [1:47.909] [N/A:N/A] A (cm) : rea [1:4.791] [N/A:N/A] Volume (cm) : [1:10.30%] [N/A:N/A] % Reduction in A [1:rea: 82.10%] [N/A:N/A] % Reduction in  Volume: [1:Full Thickness Without Exposed] [N/A:N/A] Classification: [1:Support Structures Large] [N/A:N/A] Exudate A mount: [1:Serosanguineous] [N/A:N/A] Exudate Type: [1:red, brown] [N/A:N/A] Exudate Color: [1:Distinct, outline attached] [N/A:N/A] Wound Margin: [1:Medium (34-66%)] [N/A:N/A] Granulation A mount: [1:Red, Pink, Hyper-granulation] [N/A:N/A] Granulation Quality: [1:Medium (34-66%)] [N/A:N/A] Necrotic A mount: [1:Fat Layer (Subcutaneous Tissue): Yes N/A] Exposed Structures: [1:Fascia: No Tendon: No Muscle: No Joint: No Bone: No Small (1-33%)] [N/A:N/A] Epithelialization: [1:Debridement - Excisional] [N/A:N/A] Debridement: Pre-procedure Verification/Time Out 11:40 [N/A:N/A] Taken: [1:Lidocaine 4% [name] opical Solution] [N/A:N/A] Pain Control: [1:Subcutaneous, Slough] [N/A:N/A] Tissue Debrided: [1:Skin/Subcutaneous Tissue] [N/A:N/A] Level: [1:61] [N/A:N/A] Debridement A (sq cm): [1:rea Curette] [N/A:N/A] Instrument: [1:Minimum] [N/A:N/A] Bleeding: [1:Silver Nitrate] [N/A:N/A] Hemostasis A chieved: [1:0] [N/A:N/A] Procedural Pain: [1:0] [N/A:N/A] Post Procedural Pain: [1:Procedure was tolerated well] [N/A:N/A] Debridement Treatment Response:  [1:12.2x5x0.1] [N/A:N/A] Post Debridement Measurements L x W x D (cm) [1:4.791] [N/A:N/A] Post Debridement Volume: (cm) [1:Debridement] [N/A:N/A] Treatment Notes Wound #1 (Lower Leg) Wound Laterality: Right, Lateral Cleanser Soap and Water Discharge Instruction: May shower and wash wound with dial antibacterial soap and water prior to dressing change. Wound Cleanser Discharge Instruction: Cleanse the wound with wound cleanser prior to applying a clean dressing using gauze sponges, not tissue or cotton balls. Peri-Wound Care Sween Lotion (Moisturizing lotion) Discharge Instruction: Apply moisturizing lotion as directed Topical Primary Dressing Hydrofera Blue Classic Foam, 4x4 in Discharge Instruction: Moisten with saline prior to applying to wound bed Secondary Dressing ABD Pad, 8x10 Discharge Instruction: Apply over primary dressing as directed. Woven Gauze Sponge, Non-Sterile 4x4 in Discharge Instruction: Apply over primary dressing as directed. Secured With Compression Wrap Kerlix Roll 4.5x3.1 (in/yd) Discharge Instruction: Apply Kerlix and Coban compression as directed. Coban Self-Adherent Wrap 4x5 (in/yd) Discharge Instruction: Apply over Kerlix as directed. Compression Stockings Add-Ons Electronic Signature(s) Signed: 11/04/2021 12:26:30 PM By: Kalman Shan DO Signed: 11/04/2021 6:08:07 PM By: Deon Pilling RN, BSN Entered By: Kalman Shan on 11/04/2021 12:05:36 -------------------------------------------------------------------------------- Multi-Disciplinary Care Plan Details Patient Name: Date of Service: Vicki Rhymes [name]. 11/04/2021 11:15 A M Medical Record Number: [id number] Patient Account Number: [id number] Date of Birth/Sex: Treating RN: 1924-02-02 (88 y.o. Vicki [name], Vicki [name] Primary Care Zoei Amison: Mortimer Fries Other Clinician: Referring Latiana Tomei: Treating Chrystian Ressler/Extender: Gust Rung in Treatment: 2 Active  Inactive Nutrition Nursing Diagnoses: Potential for alteratiion in Nutrition/Potential for imbalanced nutrition Goals: Patient/caregiver agrees to and verbalizes understanding of need to obtain nutritional consultation Date Initiated: 10/20/2021 Target Resolution Date: 11/27/2021 Goal Status: Active Interventions: Provide education on nutrition Treatment Activities: Education provided on Nutrition : 10/20/2021 Patient referred to Primary Care Physician for further nutritional evaluation : 10/20/2021 Notes: Pain, Acute or Chronic Nursing Diagnoses: Pain, acute or chronic: actual or potential Potential alteration in comfort, pain Goals: Patient will verbalize adequate pain control and receive pain control interventions during procedures as needed Date Initiated: 10/20/2021 Target Resolution Date: 11/27/2021 Goal Status: Active Patient/caregiver will verbalize comfort level met Date Initiated: 10/20/2021 Date Inactivated: 11/04/2021 Target Resolution Date: 11/06/2021 Goal Status: Met Interventions: Assess comfort goal upon admission Provide education on pain management Treatment Activities: Administer pain control measures as ordered : 10/20/2021 Notes: Wound/Skin Impairment Nursing Diagnoses: Knowledge deficit related to ulceration/compromised skin integrity Goals: Patient/caregiver will verbalize understanding of skin care regimen Date Initiated: 10/20/2021 Target Resolution Date: 11/27/2021 Goal Status: Active Interventions: Assess patient/caregiver ability to perform ulcer/skin care regimen upon admission and as needed Assess ulceration(s) every visit Provide education on ulcer and skin care Treatment Activities: Skin care regimen initiated : 10/20/2021 Topical wound management initiated :  10/20/2021 Notes: Electronic Signature(s) Signed: 11/04/2021 6:08:07 PM By: Deon Pilling RN, BSN Entered By: Deon Pilling on 11/04/2021  11:44:18 -------------------------------------------------------------------------------- Pain Assessment Details Patient Name: Date of Service: Vicki Rhymes [name]. 11/04/2021 11:15 A M Medical Record Number: [id number] Patient Account Number: [id number] Date of Birth/Sex: Treating RN: [birth year] (88 y.o. Vicki [name], Vicki [name] Primary Care Annaleese Guier: Mortimer Fries Other Clinician: Referring Avalynn Bowe: Treating Campbell Kray/Extender: Gust Rung in Treatment: 2 Active Problems Location of Pain Severity and Description of Pain Patient Has Paino No Site Locations Pain Management and Medication Current Pain Management: Electronic Signature(s) Signed: 11/04/2021 4:25:50 PM By: Rhae Hammock RN Entered By: Rhae Hammock on 11/04/2021 11:18:50 -------------------------------------------------------------------------------- Patient/Caregiver Education Details Patient Name: Date of Service: Vicki [name] 9/5/2023andnbsp11:15 A M Medical Record Number: [id number] Patient Account Number: [id number] Date of Birth/Gender: Treating RN: August 08, 1923 (88 y.o. Vicki [name] Primary Care Physician: Mortimer Fries Other Clinician: Referring Physician: Treating Physician/Extender: Gust Rung in Treatment: 2 Education Assessment Education Provided To: Patient and Caregiver Education Topics Provided Pain: Handouts: A Guide to Pain Control Methods: Explain/Verbal Responses: Reinforcements needed Electronic Signature(s) Signed: 11/04/2021 6:08:07 PM By: Deon Pilling RN, BSN Entered By: Deon Pilling on 11/04/2021 11:44:30 -------------------------------------------------------------------------------- Wound Assessment Details Patient Name: Date of Service: Vicki Rhymes [name]. 11/04/2021 11:15 A M Medical Record Number: [id number] Patient Account Number: [id number] Date of Birth/Sex: Treating RN: [birth year] (88 y.o. Vicki [name], Vicki [name] Primary Care Jessyca Sloan: Mortimer Fries Other Clinician: Referring Moriah Shawley: Treating Midori Dado/Extender: Gust Rung in Treatment: 2 Wound Status Wound Number: 1 Primary Etiology: Abrasion Wound Location: Right, Lateral Lower Leg Secondary Lymphedema Etiology: Wounding Event: Trauma Wound Status: Open Date Acquired: 08/04/2021 Comorbid Congestive Heart Failure, Hypertension, Osteoarthritis, Weeks Of Treatment: 2 History: Neuropathy Clustered Wound: No Photos Wound Measurements Length: (cm) 12.2 Width: (cm) 5 Depth: (cm) 0.1 Area: (cm) 47.909 Volume: (cm) 4.791 % Reduction in Area: 10.3% % Reduction in Volume: 82.1% Epithelialization: Small (1-33%) Tunneling: No Undermining: No Wound Description Classification: Full Thickness Without Exposed Support Structures Wound Margin: Distinct, outline attached Exudate Amount: Large Exudate Type: Serosanguineous Exudate Color: red, brown Foul Odor After Cleansing: No Slough/Fibrino Yes Wound Bed Granulation Amount: Medium (34-66%) Exposed Structure Granulation Quality: Red, Pink, Hyper-granulation Fascia Exposed: No Necrotic Amount: Medium (34-66%) Fat Layer (Subcutaneous Tissue) Exposed: Yes Necrotic Quality: Adherent Slough Tendon Exposed: No Muscle Exposed: No Joint Exposed: No Bone Exposed: No Treatment Notes Wound #1 (Lower Leg) Wound Laterality: Right, Lateral Cleanser Soap and Water Discharge Instruction: May shower and wash wound with dial antibacterial soap and water prior to dressing change. Wound Cleanser Discharge Instruction: Cleanse the wound with wound cleanser prior to applying a clean dressing using gauze sponges, not tissue or cotton balls. Peri-Wound Care Sween Lotion (Moisturizing lotion) Discharge Instruction: Apply moisturizing lotion as directed Topical Primary Dressing Hydrofera Blue Classic Foam, 4x4 in Discharge Instruction: Moisten with saline  prior to applying to wound bed Secondary Dressing ABD Pad, 8x10 Discharge Instruction: Apply over primary dressing as directed. Woven Gauze Sponge, Non-Sterile 4x4 in Discharge Instruction: Apply over primary dressing as directed. Secured With Compression Wrap Kerlix Roll 4.5x3.1 (in/yd) Discharge Instruction: Apply Kerlix and Coban compression as directed. Coban Self-Adherent Wrap 4x5 (in/yd) Discharge Instruction: Apply over Kerlix as directed. Compression Stockings Add-Ons Electronic Signature(s) Signed: 11/04/2021 4:25:50 PM By: Rhae Hammock RN Entered By: Rhae Hammock on 11/04/2021 11:31:38 -------------------------------------------------------------------------------- Vitals Details Patient Name: Date of Service: Vicki Dura  [name], Vicki [name]. 11/04/2021 11:15 A M Medical Record Number: [id number] Patient Account Number: [id number] Date of Birth/Sex: Treating RN: 14-Nov-1923 (88 y.o. Vicki [name], Vicki [name] Primary Care Odell Choung: Mortimer Fries Other Clinician: Referring Kameron Blethen: Treating Katalea Ucci/Extender: Gust Rung in Treatment: 2 Vital Signs Time Taken: 11:23 Temperature (F): 98.7 Height (in): 62 Pulse (bpm): 77 Weight (lbs): 142 Respiratory Rate (breaths/min): 17 Body Mass Index (BMI): 26 Blood Pressure (mmHg): 154/74 Reference Range: 80 - 120 mg / dl Electronic Signature(s) Signed: 11/04/2021 4:25:50 PM By: Rhae Hammock RN Entered By: Rhae Hammock on 11/04/2021 11:23:46

## 2021-11-04 NOTE — Progress Notes (Signed)
Vicki [name], Vicki [name] ([id number]) . Visit Report for 11/04/2021 Chief Complaint Document Details Patient Name: Date of Service: Vicki [name], Vicki [name] 11/04/2021 11:15 A M Medical Record Number: [id number] Patient Account Number: [id number] Date of Birth/Sex: Treating RN: [birth year] (88 y.o. Vicki [name], Vicki [name] Primary Care Provider: Sonia Side Other Clinician: Referring Provider: Treating Provider/Extender: Radene Ou in Treatment: 2 Information Obtained from: Patient Chief Complaint 10/20/2021; right lower extremity wound status post trauma Electronic Signature(s) Signed: 11/04/2021 12:26:30 PM By: Geralyn Corwin DO Entered By: Geralyn Corwin on 11/04/2021 12:05:42 -------------------------------------------------------------------------------- Debridement Details Patient Name: Date of Service: Vicki [name]. 11/04/2021 11:15 A M Medical Record Number: [id number] Patient Account Number: [id number] Date of Birth/Sex: Treating RN: Feb 10, 1924 (88 y.o. Vicki [name], [name] Primary Care Provider: Sonia Side Other Clinician: Referring Provider: Treating Provider/Extender: Radene Ou in Treatment: 2 Debridement Performed for Assessment: Wound #1 Right,Lateral Lower Leg Performed By: Physician Geralyn Corwin, DO Debridement Type: Debridement Level of Consciousness (Pre-procedure): Awake and Alert Pre-procedure Verification/Time Out Yes - 11:40 Taken: Start Time: 11:41 Pain Control: Lidocaine 4% [name] Area Debrided (L x W): otal 12.2 (cm) x 5 (cm) = 61 (cm) Tissue and other material debrided: Viable, Non-Viable, Slough, Subcutaneous, Skin: Dermis , Skin: Epidermis, Slough Level: Skin/Subcutaneous Tissue Debridement Description: Excisional Instrument: Curette Bleeding: Minimum Hemostasis Achieved: Silver Nitrate End Time: 11:45 Procedural Pain: 0 Post Procedural Pain: 0 Response to Treatment: Procedure was  tolerated well Level of Consciousness (Post- Awake and Alert procedure): Post Debridement Measurements of Total Wound Length: (cm) 12.2 Width: (cm) 5 Depth: (cm) 0.1 Volume: (cm) 4.791 Character of Wound/Ulcer Post Debridement: Improved Post Procedure Diagnosis Same as Pre-procedure Electronic Signature(s) Signed: 11/04/2021 12:26:30 PM By: Geralyn Corwin DO Signed: 11/04/2021 6:08:07 PM By: Shawn Stall RN, BSN Entered By: Shawn Stall on 11/04/2021 11:45:12 -------------------------------------------------------------------------------- HPI Details Patient Name: Date of Service: Vicki [name]. 11/04/2021 11:15 A M Medical Record Number: [id number] Patient Account Number: [id number] Date of Birth/Sex: Treating RN: 17-Apr-1923 (88 y.o. Vicki [name] Primary Care Provider: Sonia Side Other Clinician: Referring Provider: Treating Provider/Extender: Radene Ou in Treatment: 2 History of Present Illness HPI Description: Admission 10/20/2021 Vicki [name] is a 88 year old female with a past medical history of hypothyroidism and essential hypertension that presents to the clinic for a 62-month history of ulcer to the right lower extremity. On 08/15/2021 the patient experienced a mechanical fall and hit her right leg against her table. She developed a hematoma and she was evaluated in the ED for this issue. The hematoma was evacuated in the ED. She had x-rays of the tibia/fibula and ankle that showed no acute osseous abnormalities. She has been using silver alginate to the wound bed. She did have 2 rounds of doxycycline for this issue and recently completed her second course. She currently denies signs of infection. 8/28; patient presents for follow-up. She has no issues or complaints today. We have been doing Santyl and Hydrofera Blue under Kerlix/Coban. She has home health that they start coming out once weekly. She denies signs of infection. 9/5;  patient presents for follow-up. We have been doing Hydrofera Blue with antibiotic ointment under Kerlix/Coban. She has no issues or complaints today. Electronic Signature(s) Signed: 11/04/2021 12:26:30 PM By: Geralyn Corwin DO Entered By: Geralyn Corwin on 11/04/2021 12:06:07 -------------------------------------------------------------------------------- Physical Exam Details Patient Name: Date of Service: Vicki [name], Vicki [name]. 11/04/2021 11:15 A  M Medical Record Number: [id number] Patient Account Number: [id number] Date of Birth/Sex: Treating RN: March 13, 1923 (88 y.o. Vicki [name] Primary Care Provider: Sonia Side Other Clinician: Referring Provider: Treating Provider/Extender: Radene Ou in Treatment: 2 Constitutional respirations regular, non-labored and within target range for patient.. Cardiovascular 2+ dorsalis pedis/posterior tibialis pulses. Psychiatric pleasant and cooperative. Notes Right lower extremity: [name] the anterior aspect there is a large open wound with hyper granulated tissue, granulation tissue and nonviable tissue. No tunneling or o undermining noted. No increased warmth, erythema or purulent drainage noted. Venous stasis dermatitis. Electronic Signature(s) Signed: 11/04/2021 12:26:30 PM By: Geralyn Corwin DO Entered By: Geralyn Corwin on 11/04/2021 12:06:35 -------------------------------------------------------------------------------- Physician Orders Details Patient Name: Date of Service: Vicki [name]. 11/04/2021 11:15 A M Medical Record Number: [id number] Patient Account Number: [id number] Date of Birth/Sex: Treating RN: [birth year] (88 y.o. Vicki [name], Vicki [name] Primary Care Provider: Sonia Side Other Clinician: Referring Provider: Treating Provider/Extender: Radene Ou in Treatment: 2 Verbal / Phone Orders: No Diagnosis Coding Follow-up Appointments ppointment in 1 week. - Dr.  Mikey Bussing and [place], Room 8 Tuesday Return A ppointment in 2 weeks. - Dr. Mikey Bussing and [place], Room 8 Tuesday Return A Anesthetic (In clinic) Topical Lidocaine 4% applied to wound bed Bathing/ Shower/ Hygiene May shower with protection but do not get wound dressing(s) wet. Edema Control - Lymphedema / SCD / Other Elevate legs to the level of the heart or above for 30 minutes daily and/or when sitting, a frequency of: - 3-4 times a day throughout the day. Avoid standing for long periods of time. Exercise regularly Home Health New wound care orders this week; continue Home Health for wound care. May utilize formulary equivalent dressing for wound treatment orders unless otherwise specified. - home health to change once a week. wound center weekly. Hydrofera blue, abd pad, and kerlix coban as compression to right leg. Other Home Health Orders/Instructions: [facility] Wound Treatment Wound #1 - Lower Leg Wound Laterality: Right, Lateral Cleanser: Soap and Water (Home Health) 2 x Per Week/30 Days Discharge Instructions: May shower and wash wound with dial antibacterial soap and water prior to dressing change. Cleanser: Wound Cleanser (Home Health) 2 x Per Week/30 Days Discharge Instructions: Cleanse the wound with wound cleanser prior to applying a clean dressing using gauze sponges, not tissue or cotton balls. Peri-Wound Care: Sween Lotion (Moisturizing lotion) (Home Health) 2 x Per Week/30 Days Discharge Instructions: Apply moisturizing lotion as directed Prim Dressing: Hydrofera Blue Classic Foam, 4x4 in (Home Health) 2 x Per Week/30 Days ary Discharge Instructions: Moisten with saline prior to applying to wound bed Secondary Dressing: ABD Pad, 8x10 (Home Health) 2 x Per Week/30 Days Discharge Instructions: Apply over primary dressing as directed. Secondary Dressing: Woven Gauze Sponge, Non-Sterile 4x4 in (Home Health) 2 x Per Week/30 Days Discharge Instructions: Apply over primary  dressing as directed. Compression Wrap: Kerlix Roll 4.5x3.1 (in/yd) (Home Health) 2 x Per Week/30 Days Discharge Instructions: Apply Kerlix and Coban compression as directed. Compression Wrap: Coban Self-Adherent Wrap 4x5 (in/yd) (Home Health) 2 x Per Week/30 Days Discharge Instructions: Apply over Kerlix as directed. Electronic Signature(s) Signed: 11/04/2021 12:26:30 PM By: Geralyn Corwin DO Entered By: Geralyn Corwin on 11/04/2021 12:06:42 -------------------------------------------------------------------------------- Problem List Details Patient Name: Date of Service: Vicki [name]. 11/04/2021 11:15 A M Medical Record Number: [id number] Patient Account Number: [id number] Date of Birth/Sex: Treating RN: 1923/03/13 (88 y.o. Vicki [name], Vicki [name] Primary  Care Provider: Sonia Side Other Clinician: Referring Provider: Treating Provider/Extender: Radene Ou in Treatment: 2 Active Problems ICD-10 Encounter Code Description Active Date MDM Diagnosis [phone] Non-pressure chronic ulcer of other part of right lower leg with fat layer 10/20/2021 No Yes exposed T79.8XXA Other early complications of trauma, initial encounter 10/20/2021 No Yes E03.9 Hypothyroidism, unspecified 10/20/2021 No Yes I87.311 Chronic venous hypertension (idiopathic) with ulcer of right lower extremity 10/20/2021 No Yes Inactive Problems Resolved Problems Electronic Signature(s) Signed: 11/04/2021 12:26:30 PM By: Geralyn Corwin DO Entered By: Geralyn Corwin on 11/04/2021 12:05:24 -------------------------------------------------------------------------------- Progress Note Details Patient Name: Date of Service: Vicki [name]. 11/04/2021 11:15 A M Medical Record Number: [id number] Patient Account Number: [id number] Date of Birth/Sex: Treating RN: [birth year] (88 y.o. Vicki [name] Primary Care Provider: Sonia Side Other Clinician: Referring Provider: Treating  Provider/Extender: Radene Ou in Treatment: 2 Subjective Chief Complaint Information obtained from Patient 10/20/2021; right lower extremity wound status post trauma History of Present Illness (HPI) Admission 10/20/2021 Ms. Laverta Harnisch is a 88 year old female with a past medical history of hypothyroidism and essential hypertension that presents to the clinic for a 59-month history of ulcer to the right lower extremity. On 08/15/2021 the patient experienced a mechanical fall and hit her right leg against her table. She developed a hematoma and she was evaluated in the ED for this issue. The hematoma was evacuated in the ED. She had x-rays of the tibia/fibula and ankle that showed no acute osseous abnormalities. She has been using silver alginate to the wound bed. She did have 2 rounds of doxycycline for this issue and recently completed her second course. She currently denies signs of infection. 8/28; patient presents for follow-up. She has no issues or complaints today. We have been doing Santyl and Hydrofera Blue under Kerlix/Coban. She has home health that they start coming out once weekly. She denies signs of infection. 9/5; patient presents for follow-up. We have been doing Hydrofera Blue with antibiotic ointment under Kerlix/Coban. She has no issues or complaints today. Patient History Information obtained from Patient. Family History Diabetes - Child, Heart Disease - Child,Mother,Father,Siblings, Hypertension - Father,Mother, No family history of Cancer, Hereditary Spherocytosis, Kidney Disease, Lung Disease, Seizures, Stroke, Thyroid Problems, Tuberculosis. Social History Never smoker, Marital Status - Widowed, Alcohol Use - Never, Drug Use - No History, Caffeine Use - Rarely. Medical History Eyes Denies history of Cataracts, Glaucoma, Optic Neuritis Ear/Nose/Mouth/Throat Denies history of Chronic sinus problems/congestion, Middle ear  problems Hematologic/Lymphatic Denies history of Anemia, Hemophilia, Human Immunodeficiency Virus, Lymphedema, Sickle Cell Disease Respiratory Denies history of Aspiration, Asthma, Chronic Obstructive Pulmonary Disease (COPD), Pneumothorax, Sleep Apnea, Tuberculosis Cardiovascular Patient has history of Congestive Heart Failure, Hypertension Denies history of Angina, Arrhythmia, Coronary Artery Disease, Deep Vein Thrombosis, Hypotension, Myocardial Infarction, Peripheral Arterial Disease, Peripheral Venous Disease, Phlebitis, Vasculitis Gastrointestinal Denies history of Cirrhosis , Colitis, Crohnoos, Hepatitis A, Hepatitis B, Hepatitis C Endocrine Denies history of Type I Diabetes, Type II Diabetes Immunological Denies history of Lupus Erythematosus, Raynaudoos, Scleroderma Integumentary (Skin) Denies history of History of Burn Musculoskeletal Patient has history of Osteoarthritis Denies history of Gout, Rheumatoid Arthritis, Osteomyelitis Neurologic Patient has history of Neuropathy Denies history of Dementia, Quadriplegia, Paraplegia, Seizure Disorder Psychiatric Denies history of Anorexia/bulimia, Confinement Anxiety Hospitalization/Surgery History - back surgery. - right hip surgery. - hysterotomy. Medical A Surgical History Notes nd Ear/Nose/Mouth/Throat hearing aides Genitourinary Stage three Objective Constitutional respirations regular, non-labored and within target range for  patient.. Vitals Time Taken: 11:23 AM, Height: 62 in, Weight: 142 lbs, BMI: 26, Temperature: 98.7 F, Pulse: 77 bpm, Respiratory Rate: 17 breaths/min, Blood Pressure: 154/74 mmHg. Cardiovascular 2+ dorsalis pedis/posterior tibialis pulses. Psychiatric pleasant and cooperative. General Notes: Right lower extremity: [name] the anterior aspect there is a large open wound with hyper granulated tissue, granulation tissue and nonviable tissue. o No tunneling or undermining noted. No increased warmth,  erythema or purulent drainage noted. Venous stasis dermatitis. Integumentary (Hair, Skin) Wound #1 status is Open. Original cause of wound was Trauma. The date acquired was: 08/04/2021. The wound has been in treatment 2 weeks. The wound is located on the Right,Lateral Lower Leg. The wound measures 12.2cm length x 5cm width x 0.1cm depth; 47.909cm^2 area and 4.791cm^3 volume. There is Fat Layer (Subcutaneous Tissue) exposed. There is no tunneling or undermining noted. There is a large amount of serosanguineous drainage noted. The wound margin is distinct with the outline attached to the wound base. There is medium (34-66%) red, pink, hyper - granulation within the wound bed. There is a medium (34-66%) amount of necrotic tissue within the wound bed including Adherent Slough. Assessment Active Problems ICD-10 Non-pressure chronic ulcer of other part of right lower leg with fat layer exposed Other early complications of trauma, initial encounter Hypothyroidism, unspecified Chronic venous hypertension (idiopathic) with ulcer of right lower extremity Patient's wound has shown improvement in size in appearance since last clinic visit. I debrided nonviable tissue. I recommended continuing the course with Hydrofera Blue under Kerlix/Coban. We can stop antibiotic ointment at this time. Follow-up in 1 week. Procedures Wound #1 Pre-procedure diagnosis of Wound #1 is an Abrasion located on the Right,Lateral Lower Leg . There was a Excisional Skin/Subcutaneous Tissue Debridement with a total area of 61 sq cm performed by Geralyn CorwinHoffman, Terren Jandreau, DO. With the following instrument(s): Curette to remove Viable and Non-Viable tissue/material. Material removed includes Subcutaneous Tissue, Slough, Skin: Dermis, and Skin: Epidermis after achieving pain control using Lidocaine 4% [name] opical Solution. A time out was conducted at 11:40, prior to the start of the procedure. A Minimum amount of bleeding was controlled with Silver  Nitrate. The procedure was tolerated well with a pain level of 0 throughout and a pain level of 0 following the procedure. Post Debridement Measurements: 12.2cm length x 5cm width x 0.1cm depth; 4.791cm^3 volume. Character of Wound/Ulcer Post Debridement is improved. Post procedure Diagnosis Wound #1: Same as Pre-Procedure Plan Follow-up Appointments: Return Appointment in 1 week. - Dr. Mikey [place], [place] Tuesday Return Appointment in 2 weeks. - Dr. [place], [place] 8 Tuesday Anesthetic: (In clinic) Topical Lidocaine 4% applied to wound bed Bathing/ Shower/ Hygiene: May shower with protection but do not get wound dressing(s) wet. Edema Control - Lymphedema / SCD / Other: Elevate legs to the level of the heart or above for 30 minutes daily and/or when sitting, a frequency of: - 3-4 times a day throughout the day. Avoid standing for long periods of time. Exercise regularly Home Health: New wound care orders this week; continue Home Health for wound care. May utilize formulary equivalent dressing for wound treatment orders unless otherwise specified. - home health to change once a week. wound center weekly. Hydrofera blue, abd pad, and kerlix coban as compression to right leg. Other Home Health Orders/Instructions: [facility] WOUND #1: - Lower Leg Wound Laterality: Right, Lateral Cleanser: Soap and Water (Home Health) 2 x Per Week/30 Days Discharge Instructions: May shower and wash wound with  dial antibacterial soap and water prior to dressing change. Cleanser: Wound Cleanser (Home Health) 2 x Per Week/30 Days Discharge Instructions: Cleanse the wound with wound cleanser prior to applying a clean dressing using gauze sponges, not tissue or cotton balls. Peri-Wound Care: Sween Lotion (Moisturizing lotion) (Home Health) 2 x Per Week/30 Days Discharge Instructions: Apply moisturizing lotion as directed Prim Dressing: Hydrofera Blue Classic Foam, 4x4 in (Home Health) 2 x  Per Week/30 Days ary Discharge Instructions: Moisten with saline prior to applying to wound bed Secondary Dressing: ABD Pad, 8x10 (Home Health) 2 x Per Week/30 Days Discharge Instructions: Apply over primary dressing as directed. Secondary Dressing: Woven Gauze Sponge, Non-Sterile 4x4 in (Home Health) 2 x Per Week/30 Days Discharge Instructions: Apply over primary dressing as directed. Com pression Wrap: Kerlix Roll 4.5x3.1 (in/yd) (Home Health) 2 x Per Week/30 Days Discharge Instructions: Apply Kerlix and Coban compression as directed. Com pression Wrap: Coban Self-Adherent Wrap 4x5 (in/yd) (Home Health) 2 x Per Week/30 Days Discharge Instructions: Apply over Kerlix as directed. 1. In office sharp debridement 2. Hydrofera Blue under Kerlix/Coban Electronic Signature(s) Signed: 11/04/2021 12:26:30 PM By: Geralyn Corwin DO Signed: 11/04/2021 12:26:30 PM By: Geralyn Corwin DO Entered By: Geralyn Corwin on 11/04/2021 12:07:48 -------------------------------------------------------------------------------- HxROS Details Patient Name: Date of Service: Vicki [name]. 11/04/2021 11:15 A M Medical Record Number: [id number] Patient Account Number: [id number] Date of Birth/Sex: Treating RN: 08/15/1923 (88 y.o. Vicki [name] Primary Care Provider: Sonia Side Other Clinician: Referring Provider: Treating Provider/Extender: Radene Ou in Treatment: 2 Information Obtained From Patient Eyes Medical History: Negative for: Cataracts; Glaucoma; Optic Neuritis Ear/Nose/Mouth/Throat Medical History: Negative for: Chronic sinus problems/congestion; Middle ear problems Past Medical History Notes: hearing aides Hematologic/Lymphatic Medical History: Negative for: Anemia; Hemophilia; Human Immunodeficiency Virus; Lymphedema; Sickle Cell Disease Respiratory Medical History: Negative for: Aspiration; Asthma; Chronic Obstructive Pulmonary Disease (COPD);  Pneumothorax; Sleep Apnea; Tuberculosis Cardiovascular Medical History: Positive for: Congestive Heart Failure; Hypertension Negative for: Angina; Arrhythmia; Coronary Artery Disease; Deep Vein Thrombosis; Hypotension; Myocardial Infarction; Peripheral Arterial Disease; Peripheral Venous Disease; Phlebitis; Vasculitis Gastrointestinal Medical History: Negative for: Cirrhosis ; Colitis; Crohns; Hepatitis A; Hepatitis B; Hepatitis C Endocrine Medical History: Negative for: Type I Diabetes; Type II Diabetes Genitourinary Medical History: Past Medical History Notes: Stage three Immunological Medical History: Negative for: Lupus Erythematosus; Raynauds; Scleroderma Integumentary (Skin) Medical History: Negative for: History of Burn Musculoskeletal Medical History: Positive for: Osteoarthritis Negative for: Gout; Rheumatoid Arthritis; Osteomyelitis Neurologic Medical History: Positive for: Neuropathy Negative for: Dementia; Quadriplegia; Paraplegia; Seizure Disorder Psychiatric Medical History: Negative for: Anorexia/bulimia; Confinement Anxiety Immunizations Pneumococcal Vaccine: Received Pneumococcal Vaccination: Yes Received Pneumococcal Vaccination On or After 60th Birthday: Yes Implantable Devices Yes Hospitalization / Surgery History Type of Hospitalization/Surgery back surgery right hip surgery hysterotomy Family and Social History Cancer: No; Diabetes: Yes - Child; Heart Disease: Yes - Child,Mother,Father,Siblings; Hereditary Spherocytosis: No; Hypertension: Yes - Father,Mother; Kidney Disease: No; Lung Disease: No; Seizures: No; Stroke: No; Thyroid Problems: No; Tuberculosis: No; Never smoker; Marital Status - Widowed; Alcohol Use: Never; Drug Use: No History; Caffeine Use: Rarely; Financial Concerns: No; Food, Clothing or Shelter Needs: No; Support System Lacking: No; Transportation Concerns: No Electronic Signature(s) Signed: 11/04/2021 12:26:30 PM By: Geralyn Corwin DO Signed: 11/04/2021 6:08:07 PM By: Shawn Stall RN, BSN Entered By: Geralyn Corwin on 11/04/2021 12:06:12 -------------------------------------------------------------------------------- SuperBill Details Patient Name: Date of Service: Vicki [name]. 11/04/2021 Medical Record Number: [id number] Patient Account Number: [id number] Date of Birth/Sex: Treating  RN: Aug 26, 1923 (88 y.o. Vicki [name] Primary Care Provider: Sonia Side Other Clinician: Referring Provider: Treating Provider/Extender: Radene Ou in Treatment: 2 Diagnosis Coding ICD-10 Codes Code Description [phone] Non-pressure chronic ulcer of other part of right lower leg with fat layer exposed T79.8XXA Other early complications of trauma, initial encounter E03.9 Hypothyroidism, unspecified I87.311 Chronic venous hypertension (idiopathic) with ulcer of right lower extremity Facility Procedures CPT4 Code: [id number] ICD L9 Description: 11042 - DEB SUBQ TISSUE 20 SQ CM/< ICD-10 Diagnosis Description L97.812 Non-pressure chronic ulcer of other part of right lower leg with fat layer exp 45 - DEB SUBQ TISS EA ADDL 20CM -10 Diagnosis Description 7.812 Non-pressure chronic ulcer  of other part of right lower leg with fat layer exposed Modifier: osed 3 Quantity: 1 Physician Procedures : CPT4 Code Description Modifier [id number] 11042 - WC PHYS SUBQ TISS 20 SQ CM ICD-10 Diagnosis Description L97.812 Non-pressure chronic ulcer of other part of right lower leg with fat layer exposed Quantity: 1 : [id number] 11045 - WC PHYS SUBQ TISS EA ADDL 20 CM ICD-10 Diagnosis Description L97.812 Non-pressure chronic ulcer of other part of right lower leg with fat layer exposed Quantity: 3 Electronic Signature(s) Signed: 11/04/2021 12:26:30 PM By: Geralyn Corwin DO Entered By: Geralyn Corwin on 11/04/2021 12:07:56

## 2021-11-10 ENCOUNTER — Encounter (HOSPITAL_BASED_OUTPATIENT_CLINIC_OR_DEPARTMENT_OTHER): Payer: Medicare Other | Admitting: Internal Medicine

## 2021-11-10 DIAGNOSIS — I87311 Chronic venous hypertension (idiopathic) with ulcer of right lower extremity: Secondary | ICD-10-CM | POA: Diagnosis not present

## 2021-11-10 DIAGNOSIS — L97812 Non-pressure chronic ulcer of other part of right lower leg with fat layer exposed: Secondary | ICD-10-CM | POA: Diagnosis not present

## 2021-11-17 ENCOUNTER — Encounter (HOSPITAL_BASED_OUTPATIENT_CLINIC_OR_DEPARTMENT_OTHER): Payer: Medicare Other | Admitting: Internal Medicine

## 2021-11-17 DIAGNOSIS — L97812 Non-pressure chronic ulcer of other part of right lower leg with fat layer exposed: Secondary | ICD-10-CM

## 2021-11-17 DIAGNOSIS — I87311 Chronic venous hypertension (idiopathic) with ulcer of right lower extremity: Secondary | ICD-10-CM | POA: Diagnosis not present

## 2021-11-17 NOTE — Progress Notes (Signed)
Vicki, DURALL [name] ([id number]) . Visit Report for 11/17/2021 Arrival Information Details Patient Name: Date of Service: Vicki [name], Vicki [name] 11/17/2021 12:45 PM Medical Record Number: [id number] Patient Account Number: [id number] Date of Birth/Sex: Treating RN: Aug 22, 1923 (88 y.o. Vicki [name], Meta.Reding Primary Care Jazel Nimmons: Mortimer Fries Other Clinician: Referring Marquasha Brutus: Treating Soumya Colson/Extender: Gust Rung in Treatment: 4 Visit Information History Since Last Visit Added or deleted any medications: No Patient Arrived: Gilford Rile Any new allergies or adverse reactions: No Arrival Time: 12:41 Had a fall or experienced change in No Accompanied By: family activities of daily living that may affect Transfer Assistance: None risk of falls: Patient Identification Verified: Yes Signs or symptoms of abuse/neglect since last visito No Secondary Verification Process Completed: Yes Hospitalized since last visit: No Patient Requires Transmission-Based Precautions: No Implantable device outside of the clinic excluding No Patient Has Alerts: No cellular tissue based products placed in the center since last visit: Has Dressing in Place as Prescribed: Yes Has Compression in Place as Prescribed: Yes Pain Present Now: No Electronic Signature(s) Signed: 11/17/2021 4:49:15 PM By: Deon Pilling RN, BSN Entered By: Deon Pilling on 11/17/2021 12:41:35 -------------------------------------------------------------------------------- Encounter Discharge Information Details Patient Name: Date of Service: Vicki Rhymes [name]. 11/17/2021 12:45 PM Medical Record Number: [id number] Patient Account Number: [id number] Date of Birth/Sex: Treating RN: [birth year] (88 y.o. Vicki [name], Meta.Reding Primary Care Fatou Dunnigan: Mortimer Fries Other Clinician: Referring Rada Zegers: Treating Winnell Bento/Extender: Gust Rung in Treatment: 4 Encounter Discharge Information Items  Post Procedure Vitals Discharge Condition: Stable Temperature (F): 98.3 Ambulatory Status: Walker Pulse (bpm): 73 Discharge Destination: Home Respiratory Rate (breaths/min): 20 Transportation: Private Auto Blood Pressure (mmHg): 151/84 Accompanied By: family members Schedule Follow-up Appointment: Yes Clinical Summary of Care: Electronic Signature(s) Signed: 11/17/2021 4:49:15 PM By: Deon Pilling RN, BSN Entered By: Deon Pilling on 11/17/2021 13:00:13 -------------------------------------------------------------------------------- Lower Extremity Assessment Details Patient Name: Date of Service: Vicki Rhymes [name]. 11/17/2021 12:45 PM Medical Record Number: [id number] Patient Account Number: [id number] Date of Birth/Sex: Treating RN: 10-27-1923 (88 y.o. Vicki [name], [name] Primary Care Kaliyan Osbourn: Mortimer Fries Other Clinician: Referring Haziel Molner: Treating Lativia Velie/Extender: Gust Rung in Treatment: 4 Edema Assessment Assessed: [Left: No] [Right: Yes] Edema: [Left: N] [Right: o] Calf Left: Right: Point of Measurement: 28 cm From Medial Instep 32 cm Ankle Left: Right: Point of Measurement: 12 cm From Medial Instep 22 cm Vascular Assessment Pulses: Dorsalis Pedis Palpable: [Right:Yes] Electronic Signature(s) Signed: 11/17/2021 4:49:15 PM By: Deon Pilling RN, BSN Entered By: Deon Pilling on 11/17/2021 12:48:21 -------------------------------------------------------------------------------- Multi Wound Chart Details Patient Name: Date of Service: Vicki Rhymes [name]. 11/17/2021 12:45 PM Medical Record Number: [id number] Patient Account Number: [id number] Date of Birth/Sex: Treating RN: 1924/02/19 (88 y.o. Vicki [name], Meta.Reding Primary Care Nathanyl Andujo: Mortimer Fries Other Clinician: Referring Anis Cinelli: Treating Kimaria Struthers/Extender: Gust Rung in Treatment: 4 Vital Signs Height(in): 62 Pulse(bpm): 70 Weight(lbs):  142 Blood Pressure(mmHg): 151/84 Body Mass Index(BMI): 26 Temperature(F): 98.4 Respiratory Rate(breaths/min): 20 Photos: [N/A:N/A] Right, Lateral Lower Leg N/A N/A Wound Location: Trauma N/A N/A Wounding Event: Abrasion N/A N/A Primary Etiology: Lymphedema N/A N/A Secondary Etiology: Congestive Heart Failure, N/A N/A Comorbid History: Hypertension, Osteoarthritis, Neuropathy 08/04/2021 N/A N/A Date Acquired: 4 N/A N/A Weeks of Treatment: Open N/A N/A Wound Status: No N/A N/A Wound Recurrence: 11.7x4.5x0.1 N/A N/A Measurements L x W x D (cm) 41.351 N/A N/A A (cm) : rea 4.135 N/A N/A Volume (  cm) : 22.60% N/A N/A % Reduction in A rea: 84.50% N/A N/A % Reduction in Volume: Full Thickness Without Exposed N/A N/A Classification: Support Structures Medium N/A N/A Exudate A mount: Serosanguineous N/A N/A Exudate Type: red, brown N/A N/A Exudate Color: Distinct, outline attached N/A N/A Wound Margin: Large (67-100%) N/A N/A Granulation A mount: Red, Pink N/A N/A Granulation Quality: Small (1-33%) N/A N/A Necrotic A mount: Fat Layer (Subcutaneous Tissue): Yes N/A N/A Exposed Structures: Fascia: No Tendon: No Muscle: No Joint: No Bone: No Small (1-33%) N/A N/A Epithelialization: Debridement - Excisional N/A N/A Debridement: Pre-procedure Verification/Time Out 12:55 N/A N/A Taken: Lidocaine 4% Topical Solution N/A N/A Pain Control: Subcutaneous, Slough N/A N/A Tissue Debrided: Skin/Subcutaneous Tissue N/A N/A Level: 52.65 N/A N/A Debridement A (sq cm): rea Curette N/A N/A Instrument: Minimum N/A N/A Bleeding: Pressure N/A N/A Hemostasis A chieved: 0 N/A N/A Procedural Pain: 0 N/A N/A Post Procedural Pain: Procedure was tolerated well N/A N/A Debridement Treatment Response: 11.7x4.5x0.1 N/A N/A Post Debridement Measurements L x W x D (cm) 4.135 N/A N/A Post Debridement Volume: (cm) Debridement N/A N/A Procedures Performed: Treatment  Notes Wound #1 (Lower Leg) Wound Laterality: Right, Lateral Cleanser Soap and Water Discharge Instruction: May shower and wash wound with dial antibacterial soap and water prior to dressing change. Wound Cleanser Discharge Instruction: Cleanse the wound with wound cleanser prior to applying a clean dressing using gauze sponges, not tissue or cotton balls. Peri-Wound Care Sween Lotion (Moisturizing lotion) Discharge Instruction: Apply moisturizing lotion as directed Topical Primary Dressing PolyMem Silver Non-Adhesive Dressing, 4.25x4.25 in Discharge Instruction: Apply to wound bed as instructed Secondary Dressing ABD Pad, 8x10 Discharge Instruction: Apply over primary dressing as directed. Woven Gauze Sponge, Non-Sterile 4x4 in Discharge Instruction: Apply over primary dressing as directed. Secured With Compression Wrap Kerlix Roll 4.5x3.1 (in/yd) Discharge Instruction: Apply Kerlix and Coban compression as directed. Coban Self-Adherent Wrap 4x5 (in/yd) Discharge Instruction: Apply over Kerlix as directed. Compression Stockings Add-Ons Electronic Signature(s) Signed: 11/17/2021 1:32:18 PM By: Kalman Shan DO Signed: 11/17/2021 4:49:15 PM By: Deon Pilling RN, BSN Entered By: Kalman Shan on 11/17/2021 13:02:47 -------------------------------------------------------------------------------- [place] Details Patient Name: Date of Service: Vicki Rhymes [name]. 11/17/2021 12:45 PM Medical Record Number: [id number] Patient Account Number: [id number] Date of Birth/Sex: Treating RN: February 19, 1924 (88 y.o. Vicki [name], Meta.Reding Primary Care Ahna Konkle: Mortimer Fries Other Clinician: Referring Sonda Coppens: Treating Liya Strollo/Extender: Gust Rung in Treatment: 4 Active Inactive Nutrition Nursing Diagnoses: Potential for alteratiion in Nutrition/Potential for imbalanced nutrition Goals: Patient/caregiver agrees to and verbalizes  understanding of need to obtain nutritional consultation Date Initiated: 10/20/2021 Target Resolution Date: 11/27/2021 Goal Status: Active Interventions: Provide education on nutrition Treatment Activities: Education provided on Nutrition : 10/20/2021 Patient referred to Primary Care Physician for further nutritional evaluation : 10/20/2021 Notes: Pain, Acute or Chronic Nursing Diagnoses: Pain, acute or chronic: actual or potential Potential alteration in comfort, pain Goals: Patient will verbalize adequate pain control and receive pain control interventions during procedures as needed Date Initiated: 10/20/2021 Target Resolution Date: 11/27/2021 Goal Status: Active Patient/caregiver will verbalize comfort level met Date Initiated: 10/20/2021 Date Inactivated: 11/04/2021 Target Resolution Date: 11/06/2021 Goal Status: Met Interventions: Assess comfort goal upon admission Provide education on pain management Treatment Activities: Administer pain control measures as ordered : 10/20/2021 Notes: Wound/Skin Impairment Nursing Diagnoses: Knowledge deficit related to ulceration/compromised skin integrity Goals: Patient/caregiver will verbalize understanding of skin care regimen Date Initiated: 10/20/2021 Target Resolution Date: 11/27/2021 Goal Status: Active Interventions:  Assess patient/caregiver ability to perform ulcer/skin care regimen upon admission and as needed Assess ulceration(s) every visit Provide education on ulcer and skin care Treatment Activities: Skin care regimen initiated : 10/20/2021 Topical wound management initiated : 10/20/2021 Notes: Electronic Signature(s) Signed: 11/17/2021 4:49:15 PM By: Deon Pilling RN, BSN Entered By: Deon Pilling on 11/17/2021 12:54:26 -------------------------------------------------------------------------------- Pain Assessment Details Patient Name: Date of Service: Vicki Rhymes [name]. 11/17/2021 12:45 PM Medical Record Number:  [id number] Patient Account Number: [id number] Date of Birth/Sex: Treating RN: December 05, 1923 (88 y.o. [name] Primary Care Trinty Marken: Mortimer Fries Other Clinician: Referring Pepper Kerrick: Treating Marian Grandt/Extender: Gust Rung in Treatment: 4 Active Problems Location of Pain Severity and Description of Pain Patient Has Paino No Site Locations Rate the pain. Current Pain Level: 0 Pain Management and Medication Current Pain Management: Medication: No Cold Application: No Rest: No Massage: No Activity: No LewisE.N.S.: No Heat Application: No Leg drop or elevation: No Is the Current Pain Management Adequate: Adequate How does your wound impact your activities of daily livingo Sleep: No Bathing: No Appetite: No Relationship With Others: No Bladder Continence: No Emotions: No Bowel Continence: No Work: No Toileting: No Drive: No Dressing: No Hobbies: No Engineer, maintenance) Signed: 11/17/2021 4:49:15 PM By: Deon Pilling RN, BSN Entered By: Deon Pilling on 11/17/2021 12:41:56 -------------------------------------------------------------------------------- Patient/Caregiver Education Details Patient Name: Date of Service: Vicki [name] 9/18/2023andnbsp12:45 PM Medical Record Number: [id number] Patient Account Number: [id number] Date of Birth/Gender: Treating RN: Dec 12, 1923 (88 y.o. [name] Primary Care Physician: Mortimer Fries Other Clinician: Referring Physician: Treating Physician/Extender: Gust Rung in Treatment: 4 Education Assessment Education Provided To: Patient Education Topics Provided Wound/Skin Impairment: Handouts: Skin Care Do's and Dont's Methods: Explain/Verbal Responses: Reinforcements needed Electronic Signature(s) Signed: 11/17/2021 4:49:15 PM By: Deon Pilling RN, BSN Entered By: Deon Pilling on 11/17/2021  12:54:35 -------------------------------------------------------------------------------- Wound Assessment Details Patient Name: Date of Service: Vicki Rhymes [name]. 11/17/2021 12:45 PM Medical Record Number: [id number] Patient Account Number: [id number] Date of Birth/Sex: Treating RN: 27-Mar-1923 (88 y.o. Vicki [name], Meta.Reding Primary Care Harold Mattes: Mortimer Fries Other Clinician: Referring Yeshua Stryker: Treating Natasja Niday/Extender: Gust Rung in Treatment: 4 Wound Status Wound Number: 1 Primary Etiology: Abrasion Wound Location: Right, Lateral Lower Leg Secondary Lymphedema Etiology: Wounding Event: Trauma Wound Status: Open Date Acquired: 08/04/2021 Comorbid Congestive Heart Failure, Hypertension, Osteoarthritis, Weeks Of Treatment: 4 History: Neuropathy Clustered Wound: No Photos Wound Measurements Length: (cm) 11.7 Width: (cm) 4.5 Depth: (cm) 0.1 Area: (cm) 41.351 Volume: (cm) 4.135 % Reduction in Area: 22.6% % Reduction in Volume: 84.5% Epithelialization: Small (1-33%) Tunneling: No Undermining: No Wound Description Classification: Full Thickness Without Exposed Support Structures Wound Margin: Distinct, outline attached Exudate Amount: Medium Exudate Type: Serosanguineous Exudate Color: red, brown Foul Odor After Cleansing: No Slough/Fibrino Yes Wound Bed Granulation Amount: Large (67-100%) Exposed Structure Granulation Quality: Red, Pink Fascia Exposed: No Necrotic Amount: Small (1-33%) Fat Layer (Subcutaneous Tissue) Exposed: Yes Necrotic Quality: Adherent Slough Tendon Exposed: No Muscle Exposed: No Joint Exposed: No Bone Exposed: No Treatment Notes Wound #1 (Lower Leg) Wound Laterality: Right, Lateral Cleanser Soap and Water Discharge Instruction: May shower and wash wound with dial antibacterial soap and water prior to dressing change. Wound Cleanser Discharge Instruction: Cleanse the wound with wound cleanser prior to  applying a clean dressing using gauze sponges, not tissue or cotton balls. Peri-Wound Care Sween Lotion (Moisturizing lotion) Discharge Instruction: Apply moisturizing lotion as directed  Topical Primary Dressing PolyMem Silver Non-Adhesive Dressing, 4.25x4.25 in Discharge Instruction: Apply to wound bed as instructed Secondary Dressing ABD Pad, 8x10 Discharge Instruction: Apply over primary dressing as directed. Woven Gauze Sponge, Non-Sterile 4x4 in Discharge Instruction: Apply over primary dressing as directed. Secured With Compression Wrap Kerlix Roll 4.5x3.1 (in/yd) Discharge Instruction: Apply Kerlix and Coban compression as directed. Coban Self-Adherent Wrap 4x5 (in/yd) Discharge Instruction: Apply over Kerlix as directed. Compression Stockings Add-Ons Electronic Signature(s) Signed: 11/17/2021 4:49:15 PM By: Deon Pilling RN, BSN Entered By: Deon Pilling on 11/17/2021 12:51:52 -------------------------------------------------------------------------------- Vitals Details Patient Name: Date of Service: Vicki Rhymes [name]. 11/17/2021 12:45 PM Medical Record Number: [id number] Patient Account Number: [id number] Date of Birth/Sex: Treating RN: 06/17/1923 (88 y.o. Vicki [name], Meta.Reding Primary Care Jaceon Heiberger: Mortimer Fries Other Clinician: Referring Gusta Marksberry: Treating Bradden Tadros/Extender: Gust Rung in Treatment: 4 Vital Signs Time Taken: 12:40 Temperature (F): 98.4 Height (in): 62 Pulse (bpm): 73 Weight (lbs): 142 Respiratory Rate (breaths/min): 20 Body Mass Index (BMI): 26 Blood Pressure (mmHg): 151/84 Reference Range: 80 - 120 mg / dl Electronic Signature(s) Signed: 11/17/2021 4:49:15 PM By: Deon Pilling RN, BSN Entered By: Deon Pilling on 11/17/2021 12:41:49

## 2021-11-17 NOTE — Progress Notes (Signed)
Vicki [name] ([id number]) . Visit Report for 11/17/2021 Chief Complaint Document Details Patient Name: Date of Service: Vicki [name], Vicki [name] 11/17/2021 12:45 PM Medical Record Number: [id number] Patient Account Number: [id number] Date of Birth/Sex: Treating RN: 08-24-1923 (88 y.o. Vicki [name], Vicki [name] Primary Care Provider: Sonia Side Other Clinician: Referring Provider: Treating Provider/Extender: Radene Ou in Treatment: 4 Information Obtained from: Patient Chief Complaint 10/20/2021; right lower extremity wound status post trauma Electronic Signature(s) Signed: 11/17/2021 1:32:18 PM By: Geralyn Corwin DO Entered By: Geralyn Corwin on 11/17/2021 13:03:00 -------------------------------------------------------------------------------- Debridement Details Patient Name: Date of Service: Vicki Presume [name]. 11/17/2021 12:45 PM Medical Record Number: [id number] Patient Account Number: [id number] Date of Birth/Sex: Treating RN: 12-30-1923 (88 y.o. Vicki [name], [name] Primary Care Provider: Sonia Side Other Clinician: Referring Provider: Treating Provider/Extender: Radene Ou in Treatment: 4 Debridement Performed for Assessment: Wound #1 Right,Lateral Lower Leg Performed By: Physician Geralyn Corwin, DO Debridement Type: Debridement Level of Consciousness (Pre-procedure): Awake and Alert Pre-procedure Verification/Time Out Yes - 12:55 Taken: Start Time: 12:56 Pain Control: Lidocaine 4% [name] Area Debrided (L x W): otal 11.7 (cm) x 4.5 (cm) = 52.65 (cm) Tissue and other material debrided: Viable, Non-Viable, Slough, Subcutaneous, Skin: Dermis , Skin: Epidermis, Slough, Hyper-granulation Level: Skin/Subcutaneous Tissue Debridement Description: Excisional Instrument: Curette Bleeding: Minimum Hemostasis Achieved: Pressure End Time: 13:00 Procedural Pain: 0 Post Procedural Pain: 0 Response to  Treatment: Procedure was tolerated well Level of Consciousness (Post- Awake and Alert procedure): Post Debridement Measurements of Total Wound Length: (cm) 11.7 Width: (cm) 4.5 Depth: (cm) 0.1 Volume: (cm) 4.135 Character of Wound/Ulcer Post Debridement: Improved Post Procedure Diagnosis Same as Pre-procedure Electronic Signature(s) Signed: 11/17/2021 1:32:18 PM By: Geralyn Corwin DO Signed: 11/17/2021 4:49:15 PM By: Vicki Stall RN, BSN Entered By: Vicki [name] on 11/17/2021 12:59:37 -------------------------------------------------------------------------------- HPI Details Patient Name: Date of Service: Vicki Presume [name]. 11/17/2021 12:45 PM Medical Record Number: [id number] Patient Account Number: [id number] Date of Birth/Sex: Treating RN: May 20, 1923 (88 y.o. Vicki [name] Primary Care Provider: Sonia Side Other Clinician: Referring Provider: Treating Provider/Extender: Radene Ou in Treatment: 4 History of Present Illness HPI Description: Admission 10/20/2021 [name] is a 88 year old female with a past medical history of hypothyroidism and essential hypertension that presents to the clinic for a 69-month history of ulcer to the right lower extremity. On 08/15/2021 the patient experienced a mechanical fall and hit her right leg against her table. She developed a hematoma and she was evaluated in the ED for this issue. The hematoma was evacuated in the ED. She had x-rays of the tibia/fibula and ankle that showed no acute osseous abnormalities. She has been using silver alginate to the wound bed. She did have 2 rounds of doxycycline for this issue and recently completed her second course. She currently denies signs of infection. 8/28; patient presents for follow-up. She has no issues or complaints today. We have been doing Santyl and Hydrofera Blue under Kerlix/Coban. She has home health that they start coming out once weekly. She  denies signs of infection. 9/5; patient presents for follow-up. We have been doing Hydrofera Blue with antibiotic ointment under Kerlix/Coban. She has no issues or complaints today. 9/11; patient presents for follow-up. We have been doing Hydrofera Blue under Kerlix/Coban. Home health has been changing the dressing weekly. The wrap was too tight at the more proximal and cutting into the patient's skin. Other  than that she has no issues or complaints today. 9/18; patient presents for follow-up. We have been using PolyMem silver under Kerlix/Coban for the past week. She has no issues or complaints today. Electronic Signature(s) Signed: 11/17/2021 1:32:18 PM By: Kalman Shan DO Entered By: Kalman Shan on 11/17/2021 13:03:28 -------------------------------------------------------------------------------- Physical Exam Details Patient Name: Date of Service: Vicki [name] [name]. 11/17/2021 12:45 PM Medical Record Number: [id number] Patient Account Number: [id number] Date of Birth/Sex: Treating RN: 1923/08/02 (88 y.o. Vicki [name] Primary Care Provider: Mortimer Fries Other Clinician: Referring Provider: Treating Provider/Extender: Gust Rung in Treatment: 4 Constitutional respirations regular, non-labored and within target range for patient.. Cardiovascular 2+ dorsalis pedis/posterior tibialis pulses. Psychiatric pleasant and cooperative. Notes Right lower extremity: [name] the anterior aspect there is a large open wound with granulation tissue, hypergranulated areas and nonviable tissue. No signs of o surrounding infection. Venous stasis dermatitis. Electronic Signature(s) Signed: 11/17/2021 1:32:18 PM By: Kalman Shan DO Entered By: Kalman Shan on 11/17/2021 13:05:34 -------------------------------------------------------------------------------- Physician Orders Details Patient Name: Date of Service: Vicki [name] [name]. 11/17/2021 12:45  PM Medical Record Number: [id number] Patient Account Number: [id number] Date of Birth/Sex: Treating RN: [birth year] (88 y.o. Vicki [name] Primary Care Provider: Mortimer Fries Other Clinician: Referring Provider: Treating Provider/Extender: Gust Rung in Treatment: 4 Verbal / Phone Orders: No Diagnosis Coding ICD-10 Coding Code Description [phone] Non-pressure chronic ulcer of other part of right lower leg with fat layer exposed T79.8XXA Other early complications of trauma, initial encounter E03.9 Hypothyroidism, unspecified I87.311 Chronic venous hypertension (idiopathic) with ulcer of right lower extremity Follow-up Appointments ppointment in 1 week. - Dr. Heber [place] and [place], Room 8 Tuesday Return A ppointment in 2 weeks. - Dr. Heber [place] and [place], Room 8 Tuesday Return A Anesthetic (In clinic) Topical Lidocaine 4% applied to wound bed Cellular or Tissue Based Products Wound #1 Right,Lateral Lower Leg Cellular or Tissue Based Product Type: - run insurance authorization for advance tissue products. Bathing/ Shower/ Hygiene May shower with protection but do not get wound dressing(s) wet. Edema Control - Lymphedema / SCD / Other Elevate legs to the level of the heart or above for 30 minutes daily and/or when sitting, a frequency of: - 3-4 times a day throughout the day. Avoid standing for long periods of time. Exercise regularly [facility] for wound care. Other Home Health Orders/Instructions: [facility] Wound Treatment Wound #1 - Lower Leg Wound Laterality: Right, Lateral Cleanser: Soap and Water 1 x Per Week/30 Days Discharge Instructions: May shower and wash wound with dial antibacterial soap and water prior to dressing change. Cleanser: Wound Cleanser 1 x Per Week/30 Days Discharge Instructions: Cleanse the wound with wound cleanser prior to applying a clean dressing using gauze sponges, not tissue or cotton  balls. Peri-Wound Care: Sween Lotion (Moisturizing lotion) 1 x Per Week/30 Days Discharge Instructions: Apply moisturizing lotion as directed Prim Dressing: PolyMem Silver Non-Adhesive Dressing, 4.25x4.25 in 1 x Per Week/30 Days ary Discharge Instructions: Apply to wound bed as instructed Secondary Dressing: ABD Pad, 8x10 (Home Health) 1 x Per Week/30 Days Discharge Instructions: Apply over primary dressing as directed. Secondary Dressing: Woven Gauze Sponge, Non-Sterile 4x4 in (Home Health) 1 x Per Week/30 Days Discharge Instructions: Apply over primary dressing as directed. Compression Wrap: Kerlix Roll 4.5x3.1 (in/yd) (Home Health) 1 x Per Week/30 Days Discharge Instructions: Apply Kerlix and Coban compression as directed. Compression Wrap: Coban Self-Adherent Wrap 4x5 (in/yd) (  Home Health) 1 x Per Week/30 Days Discharge Instructions: Apply over Kerlix as directed. Electronic Signature(s) Signed: 11/17/2021 1:32:18 PM By: Geralyn Corwin DO Entered By: Geralyn Corwin on 11/17/2021 13:04:36 -------------------------------------------------------------------------------- Problem List Details Patient Name: Date of Service: Vicki Presume [name]. 11/17/2021 12:45 PM Medical Record Number: [id number] Patient Account Number: [id number] Date of Birth/Sex: Treating RN: [birth year] (88 y.o. Vicki [name] Primary Care Provider: Sonia Side Other Clinician: Referring Provider: Treating Provider/Extender: Radene Ou in Treatment: 4 Active Problems ICD-10 Encounter Code Description Active Date MDM Diagnosis [phone] Non-pressure chronic ulcer of other part of right lower leg with fat layer 10/20/2021 No Yes exposed T79.8XXA Other early complications of trauma, initial encounter 10/20/2021 No Yes E03.9 Hypothyroidism, unspecified 10/20/2021 No Yes I87.311 Chronic venous hypertension (idiopathic) with ulcer of right lower extremity 10/20/2021 No Yes Inactive  Problems Resolved Problems Electronic Signature(s) Signed: 11/17/2021 1:32:18 PM By: Geralyn Corwin DO Entered By: Geralyn Corwin on 11/17/2021 13:02:40 -------------------------------------------------------------------------------- Progress Note Details Patient Name: Date of Service: Vicki Presume [name]. 11/17/2021 12:45 PM Medical Record Number: [id number] Patient Account Number: [id number] Date of Birth/Sex: Treating RN: September 11, 1923 (88 y.o. Vicki [name] Primary Care Provider: Sonia Side Other Clinician: Referring Provider: Treating Provider/Extender: Radene Ou in Treatment: 4 Subjective Chief Complaint Information obtained from Patient 10/20/2021; right lower extremity wound status post trauma History of Present Illness (HPI) Admission 10/20/2021 Ms. Kenzlie Disch is a 88 year old female with a past medical history of hypothyroidism and essential hypertension that presents to the clinic for a 46-month history of ulcer to the right lower extremity. On 08/15/2021 the patient experienced a mechanical fall and hit her right leg against her table. She developed a hematoma and she was evaluated in the ED for this issue. The hematoma was evacuated in the ED. She had x-rays of the tibia/fibula and ankle that showed no acute osseous abnormalities. She has been using silver alginate to the wound bed. She did have 2 rounds of doxycycline for this issue and recently completed her second course. She currently denies signs of infection. 8/28; patient presents for follow-up. She has no issues or complaints today. We have been doing Santyl and Hydrofera Blue under Kerlix/Coban. She has home health that they start coming out once weekly. She denies signs of infection. 9/5; patient presents for follow-up. We have been doing Hydrofera Blue with antibiotic ointment under Kerlix/Coban. She has no issues or complaints today. 9/11; patient presents for follow-up. We  have been doing Hydrofera Blue under Kerlix/Coban. Home health has been changing the dressing weekly. The wrap was too tight at the more proximal and cutting into the patient's skin. Other than that she has no issues or complaints today. 9/18; patient presents for follow-up. We have been using PolyMem silver under Kerlix/Coban for the past week. She has no issues or complaints today. Patient History Information obtained from Patient. Family History Diabetes - Child, Heart Disease - Child,Mother,Father,Siblings, Hypertension - Father,Mother, No family history of Cancer, Hereditary Spherocytosis, Kidney Disease, Lung Disease, Seizures, Stroke, Thyroid Problems, Tuberculosis. Social History Never smoker, Marital Status - Widowed, Alcohol Use - Never, Drug Use - No History, Caffeine Use - Rarely. Medical History Eyes Denies history of Cataracts, Glaucoma, Optic Neuritis Ear/Nose/Mouth/Throat Denies history of Chronic sinus problems/congestion, Middle ear problems Hematologic/Lymphatic Denies history of Anemia, Hemophilia, Human Immunodeficiency Virus, Lymphedema, Sickle Cell Disease Respiratory Denies history of Aspiration, Asthma, Chronic Obstructive Pulmonary Disease (COPD), Pneumothorax, Sleep Apnea, Tuberculosis Cardiovascular  Patient has history of Congestive Heart Failure, Hypertension Denies history of Angina, Arrhythmia, Coronary Artery Disease, Deep Vein Thrombosis, Hypotension, Myocardial Infarction, Peripheral Arterial Disease, Peripheral Venous Disease, Phlebitis, Vasculitis Gastrointestinal Denies history of Cirrhosis , Colitis, Crohnoos, Hepatitis A, Hepatitis B, Hepatitis C Endocrine Denies history of Type I Diabetes, Type II Diabetes Immunological Denies history of Lupus Erythematosus, Raynaudoos, Scleroderma Integumentary (Skin) Denies history of History of Burn Musculoskeletal Patient has history of Osteoarthritis Denies history of Gout, Rheumatoid Arthritis,  Osteomyelitis Neurologic Patient has history of Neuropathy Denies history of Dementia, Quadriplegia, Paraplegia, Seizure Disorder Psychiatric Denies history of Anorexia/bulimia, Confinement Anxiety Hospitalization/Surgery History - back surgery. - right hip surgery. - hysterotomy. Medical A Surgical History Notes nd Ear/Nose/Mouth/Throat hearing aides Genitourinary Stage three Objective Constitutional respirations regular, non-labored and within target range for patient.. Vitals Time Taken: 12:40 PM, Height: 62 in, Weight: 142 lbs, BMI: 26, Temperature: 98.4 F, Pulse: 73 bpm, Respiratory Rate: 20 breaths/min, Blood Pressure: 151/84 mmHg. Cardiovascular 2+ dorsalis pedis/posterior tibialis pulses. Psychiatric pleasant and cooperative. General Notes: Right lower extremity: [name] the anterior aspect there is a large open wound with granulation tissue, hypergranulated areas and nonviable tissue. o No signs of surrounding infection. Venous stasis dermatitis. Integumentary (Hair, Skin) Wound #1 status is Open. Original cause of wound was Trauma. The date acquired was: 08/04/2021. The wound has been in treatment 4 weeks. The wound is located on the Right,Lateral Lower Leg. The wound measures 11.7cm length x 4.5cm width x 0.1cm depth; 41.351cm^2 area and 4.135cm^3 volume. There is Fat Layer (Subcutaneous Tissue) exposed. There is no tunneling or undermining noted. There is a medium amount of serosanguineous drainage noted. The wound margin is distinct with the outline attached to the wound base. There is large (67-100%) red, pink granulation within the wound bed. There is a small (1- 33%) amount of necrotic tissue within the wound bed including Adherent Slough. Assessment Active Problems ICD-10 Non-pressure chronic ulcer of other part of right lower leg with fat layer exposed Other early complications of trauma, initial encounter Hypothyroidism, unspecified Chronic venous hypertension  (idiopathic) with ulcer of right lower extremity Patient's wound has shown improvement in size and appearance since last clinic visit. I debrided nonviable tissue.I used silver nitrate to the hyper granulated areas. No signs of surrounding infection. I recommended continuing the course with PolyMem silver under Kerlix/Coban. Follow-up in 1 week. Procedures Wound #1 Pre-procedure diagnosis of Wound #1 is an Abrasion located on the Right,Lateral Lower Leg . There was a Excisional Skin/Subcutaneous Tissue Debridement with a total area of 52.65 sq cm performed by Geralyn CorwinHoffman, Esbeidy Mclaine, DO. With the following instrument(s): Curette to remove Viable and Non-Viable tissue/material. Material removed includes Subcutaneous Tissue, Slough, Skin: Dermis, Skin: Epidermis, and Hyper-granulation after achieving pain control using Lidocaine 4% [name] opical Solution. A time out was conducted at 12:55, prior to the start of the procedure. A Minimum amount of bleeding was controlled with Pressure. The procedure was tolerated well with a pain level of 0 throughout and a pain level of 0 following the procedure. Post Debridement Measurements: 11.7cm length x 4.5cm width x 0.1cm depth; 4.135cm^3 volume. Character of Wound/Ulcer Post Debridement is improved. Post procedure Diagnosis Wound #1: Same as Pre-Procedure Plan Follow-up Appointments: Return Appointment in 1 week. - [place]. [place] Tuesday Return Appointment in 2 weeks. - [place] Anesthetic: (In clinic) Topical Lidocaine 4% applied to wound bed Cellular or Tissue Based Products: Wound #1 Right,Lateral Lower Leg: Cellular or  Tissue Based Product Type: - run insurance authorization for advance tissue products. Bathing/ Shower/ Hygiene: May shower with protection but do not get wound dressing(s) wet. Edema Control - Lymphedema / SCD / Other: Elevate legs to the level of the heart or above for 30 minutes daily and/or when  sitting, a frequency of: - 3-4 times a day throughout the day. Avoid standing for long periods of time. Exercise regularly Home Health: Discontinue home health for wound care. Other Home Health Orders/Instructions: [facility] WOUND #1: - Lower Leg Wound Laterality: Right, Lateral Cleanser: Soap and Water 1 x Per Week/30 Days Discharge Instructions: May shower and wash wound with dial antibacterial soap and water prior to dressing change. Cleanser: Wound Cleanser 1 x Per Week/30 Days Discharge Instructions: Cleanse the wound with wound cleanser prior to applying a clean dressing using gauze sponges, not tissue or cotton balls. Peri-Wound Care: Sween Lotion (Moisturizing lotion) 1 x Per Week/30 Days Discharge Instructions: Apply moisturizing lotion as directed Prim Dressing: PolyMem Silver Non-Adhesive Dressing, 4.25x4.25 in 1 x Per Week/30 Days ary Discharge Instructions: Apply to wound bed as instructed Secondary Dressing: ABD Pad, 8x10 (Home Health) 1 x Per Week/30 Days Discharge Instructions: Apply over primary dressing as directed. Secondary Dressing: Woven Gauze Sponge, Non-Sterile 4x4 in (Home Health) 1 x Per Week/30 Days Discharge Instructions: Apply over primary dressing as directed. Com pression Wrap: Kerlix Roll 4.5x3.1 (in/yd) (Home Health) 1 x Per Week/30 Days Discharge Instructions: Apply Kerlix and Coban compression as directed. Com pression Wrap: Coban Self-Adherent Wrap 4x5 (in/yd) (Home Health) 1 x Per Week/30 Days Discharge Instructions: Apply over Kerlix as directed. 1. In office sharp debridement 2. PolyMem silver under Kerlix/Coban 3. Silver nitrate 4. Follow-up in 1 week Electronic Signature(s) Signed: 11/17/2021 1:32:18 PM By: Geralyn Corwin DO Entered By: Geralyn Corwin on 11/17/2021 13:05:43 -------------------------------------------------------------------------------- HxROS Details Patient Name: Date of Service: Vicki Presume [name]. 11/17/2021  12:45 PM Medical Record Number: [id number] Patient Account Number: [id number] Date of Birth/Sex: Treating RN: Jun 02, 1923 (88 y.o. Vicki [name] Primary Care Provider: Sonia Side Other Clinician: Referring Provider: Treating Provider/Extender: Radene Ou in Treatment: 4 Information Obtained From Patient Eyes Medical History: Negative for: Cataracts; Glaucoma; Optic Neuritis Ear/Nose/Mouth/Throat Medical History: Negative for: Chronic sinus problems/congestion; Middle ear problems Past Medical History Notes: hearing aides Hematologic/Lymphatic Medical History: Negative for: Anemia; Hemophilia; Human Immunodeficiency Virus; Lymphedema; Sickle Cell Disease Respiratory Medical History: Negative for: Aspiration; Asthma; Chronic Obstructive Pulmonary Disease (COPD); Pneumothorax; Sleep Apnea; Tuberculosis Cardiovascular Medical History: Positive for: Congestive Heart Failure; Hypertension Negative for: Angina; Arrhythmia; Coronary Artery Disease; Deep Vein Thrombosis; Hypotension; Myocardial Infarction; Peripheral Arterial Disease; Peripheral Venous Disease; Phlebitis; Vasculitis Gastrointestinal Medical History: Negative for: Cirrhosis ; Colitis; Crohns; Hepatitis A; Hepatitis B; Hepatitis C Endocrine Medical History: Negative for: Type I Diabetes; Type II Diabetes Genitourinary Medical History: Past Medical History Notes: Stage three Immunological Medical History: Negative for: Lupus Erythematosus; Raynauds; Scleroderma Integumentary (Skin) Medical History: Negative for: History of Burn Musculoskeletal Medical History: Positive for: Osteoarthritis Negative for: Gout; Rheumatoid Arthritis; Osteomyelitis Neurologic Medical History: Positive for: Neuropathy Negative for: Dementia; Quadriplegia; Paraplegia; Seizure Disorder Psychiatric Medical History: Negative for: Anorexia/bulimia; Confinement Anxiety Immunizations Pneumococcal  Vaccine: Received Pneumococcal Vaccination: Yes Received Pneumococcal Vaccination On or After 60th Birthday: Yes Implantable Devices Yes Hospitalization / Surgery History Type of Hospitalization/Surgery back surgery right hip surgery hysterotomy Family and Social History Cancer: No; Diabetes: Yes - Child; Heart Disease: Yes - Child,Mother,Father,Siblings; Hereditary Spherocytosis: No;  Hypertension: Yes - Father,Mother; Kidney Disease: No; Lung Disease: No; Seizures: No; Stroke: No; Thyroid Problems: No; Tuberculosis: No; Never smoker; Marital Status - Widowed; Alcohol Use: Never; Drug Use: No History; Caffeine Use: Rarely; Financial Concerns: No; Food, Clothing or Shelter Needs: No; Support System Lacking: No; Transportation Concerns: No Electronic Signature(s) Signed: 11/17/2021 1:32:18 PM By: Geralyn Corwin DO Signed: 11/17/2021 4:49:15 PM By: Vicki Stall RN, BSN Entered By: Geralyn Corwin on 11/17/2021 13:03:33 -------------------------------------------------------------------------------- SuperBill Details Patient Name: Date of Service: Vicki Presume [name]. 11/17/2021 Medical Record Number: [id number] Patient Account Number: [id number] Date of Birth/Sex: Treating RN: 08/19/1923 (88 y.o. Vicki [name] Primary Care Provider: Sonia Side Other Clinician: Referring Provider: Treating Provider/Extender: Radene Ou in Treatment: 4 Diagnosis Coding ICD-10 Codes Code Description [phone] Non-pressure chronic ulcer of other part of right lower leg with fat layer exposed T79.8XXA Other early complications of trauma, initial encounter E03.9 Hypothyroidism, unspecified I87.311 Chronic venous hypertension (idiopathic) with ulcer of right lower extremity Facility Procedures CPT4 Code: [id number] Description: 11042 - DEB SUBQ TISSUE 20 SQ CM/< ICD-10 Diagnosis Description L97.812 Non-pressure chronic ulcer of other part of right lower leg with fat  layer exp Modifier: osed Quantity: 1 CPT4 Code: [id number] Description: 11045 - DEB SUBQ TISS EA ADDL 20CM ICD-10 Diagnosis Description L97.812 Non-pressure chronic ulcer of other part of right lower leg with fat layer exp Modifier: osed Quantity: 2 Physician Procedures : CPT4 Code Description Modifier [id number] 11042 - WC PHYS SUBQ TISS 20 SQ CM ICD-10 Diagnosis Description L97.812 Non-pressure chronic ulcer of other part of right lower leg with fat layer exposed Quantity: 1 : [id number] 11045 - WC PHYS SUBQ TISS EA ADDL 20 CM ICD-10 Diagnosis Description L97.812 Non-pressure chronic ulcer of other part of right lower leg with fat layer exposed Quantity: 2 Electronic Signature(s) Signed: 11/17/2021 1:32:18 PM By: Geralyn Corwin DO Entered By: Geralyn Corwin on 11/17/2021 13:05:51

## 2021-11-24 ENCOUNTER — Encounter (HOSPITAL_BASED_OUTPATIENT_CLINIC_OR_DEPARTMENT_OTHER): Payer: Medicare Other | Admitting: Internal Medicine

## 2021-11-24 DIAGNOSIS — I87311 Chronic venous hypertension (idiopathic) with ulcer of right lower extremity: Secondary | ICD-10-CM | POA: Diagnosis not present

## 2021-11-24 DIAGNOSIS — L97812 Non-pressure chronic ulcer of other part of right lower leg with fat layer exposed: Secondary | ICD-10-CM | POA: Diagnosis not present

## 2021-11-24 NOTE — Progress Notes (Signed)
PRESLI, FANGUY T ([id number]) . Visit Report for 11/24/2021 Chief Complaint Document Details Patient Name: Date of Service: Vicki [name], Vicki T. 11/24/2021 3:00 PM Medical Record Number: [id number] Patient Account Number: [id number] Date of Birth/Sex: Treating RN: November 28, 1923 (88 y.o. Vicki [name], Vicki [name] Primary Care Provider: Sonia [name] Other Clinician: Referring Provider: Treating Provider/Extender: Radene Ou in Treatment: 5 Information Obtained from: Patient Chief Complaint 10/20/2021; right lower extremity wound status post trauma Electronic Signature(s) Signed: 11/24/2021 4:01:39 PM By: Geralyn Corwin DO Entered By: Geralyn Corwin on 11/24/2021 15:57:51 -------------------------------------------------------------------------------- Debridement Details Patient Name: Date of Service: Vicki [name], Vicki Moulding T. 11/24/2021 3:00 PM Medical Record Number: [id number] Patient Account Number: [id number] Date of Birth/Sex: Treating RN: [birth year] (88 y.o. Vicki [name], Vicki [name] Primary Care Provider: Sonia [name] Other Clinician: Referring Provider: Treating Provider/Extender: Radene Ou in Treatment: 5 Debridement Performed for Assessment: Wound #1 Right,Lateral Lower Leg Performed By: Physician Geralyn Corwin, DO Debridement Type: Debridement Level of Consciousness (Pre-procedure): Awake and Alert Pre-procedure Verification/Time Out Yes - 15:40 Taken: Start Time: 15:41 Pain Control: Lidocaine 5% topical ointment T Area Debrided (L x W): otal 11.6 (cm) x 3.8 (cm) = 44.08 (cm) Tissue and other material debrided: Viable, Non-Viable, Slough, Subcutaneous, Slough Level: Skin/Subcutaneous Tissue Debridement Description: Excisional Instrument: Curette Bleeding: Minimum Hemostasis Achieved: Pressure End Time: 15:46 Procedural Pain: 0 Post Procedural Pain: 0 Response to Treatment: Procedure was tolerated well Level of  Consciousness (Post- Awake and Alert procedure): Post Debridement Measurements of Total Wound Length: (cm) 11.6 Width: (cm) 3.8 Depth: (cm) 0.1 Volume: (cm) 3.462 Character of Wound/Ulcer Post Debridement: Improved Post Procedure Diagnosis Same as Pre-procedure Electronic Signature(s) Signed: 11/24/2021 4:01:39 PM By: Geralyn Corwin DO Signed: 11/24/2021 5:50:10 PM By: Shawn Stall RN, BSN Entered By: Shawn Stall on 11/24/2021 15:47:12 -------------------------------------------------------------------------------- HPI Details Patient Name: Date of Service: Vicki [name], Vicki T. 11/24/2021 3:00 PM Medical Record Number: [id number] Patient Account Number: [id number] Date of Birth/Sex: Treating RN: August 11, 1923 (88 y.o. Vicki [name] Primary Care Provider: Sonia [name] Other Clinician: Referring Provider: Treating Provider/Extender: Radene Ou in Treatment: 5 History of Present Illness HPI Description: Admission 10/20/2021 [name] is a 88 year old female with a past medical history of hypothyroidism and essential hypertension that presents to the clinic for a 96-month history of ulcer to the right lower extremity. On 08/15/2021 the patient experienced a mechanical fall and hit her right leg against her table. She developed a hematoma and she was evaluated in the ED for this issue. The hematoma was evacuated in the ED. She had x-rays of the tibia/fibula and ankle that showed no acute osseous abnormalities. She has been using silver alginate to the wound bed. She did have 2 rounds of doxycycline for this issue and recently completed her second course. She currently denies signs of infection. 8/28; patient presents for follow-up. She has no issues or complaints today. We have been doing Santyl and Hydrofera Blue under Kerlix/Coban. She has home health that they start coming out once weekly. She denies signs of infection. 9/5; patient presents for  follow-up. We have been doing Hydrofera Blue with antibiotic ointment under Kerlix/Coban. She has no issues or complaints today. 9/11; patient presents for follow-up. We have been doing Hydrofera Blue under Kerlix/Coban. Home health has been changing the dressing weekly. The wrap was too tight at the more proximal and cutting into the patient's skin. Other than that she has no issues or  complaints today. 9/18; patient presents for follow-up. We have been using PolyMem silver under Kerlix/Coban for the past week. She has no issues or complaints today. 9/25; patient presents for follow-up. We have been using PolyMem silver under Kerlix/Coban. She has no issues or complaints today. Electronic Signature(s) Signed: 11/24/2021 4:01:39 PM By: Geralyn Corwin DO Entered By: Geralyn Corwin on 11/24/2021 15:58:08 -------------------------------------------------------------------------------- Physical Exam Details Patient Name: Date of Service: Vicki [name] 11/24/2021 3:00 PM Medical Record Number: [id number] Patient Account Number: [id number] Date of Birth/Sex: Treating RN: 08-23-1923 (88 y.o. Vicki [name] Primary Care Provider: Sonia [name] Other Clinician: Referring Provider: Treating Provider/Extender: Radene Ou in Treatment: 5 Constitutional respirations regular, non-labored and within target range for patient.. Cardiovascular 2+ dorsalis pedis/posterior tibialis pulses. Psychiatric pleasant and cooperative. Notes Right lower extremity: T the anterior aspect there is a large open wound with granulation tissue,and nonviable tissue. No signs of surrounding infection. Venous o stasis dermatitis. She does have some small areas that are darkened however I think this is from the previous silver nitrate. Electronic Signature(s) Signed: 11/24/2021 4:01:39 PM By: Geralyn Corwin DO Entered By: Geralyn Corwin on 11/24/2021  15:58:53 -------------------------------------------------------------------------------- Physician Orders Details Patient Name: Date of Service: Vicki [name] 11/24/2021 3:00 PM Medical Record Number: [id number] Patient Account Number: [id number] Date of Birth/Sex: Treating RN: December 12, 1923 (88 y.o. Vicki [name] Primary Care Provider: Sonia [name] Other Clinician: Referring Provider: Treating Provider/Extender: Radene Ou in Treatment: 5 Verbal / Phone Orders: No Diagnosis Coding ICD-10 Coding Code Description [phone] Non-pressure chronic ulcer of other part of right lower leg with fat layer exposed T79.8XXA Other early complications of trauma, initial encounter E03.9 Hypothyroidism, unspecified I87.311 Chronic venous hypertension (idiopathic) with ulcer of right lower extremity Follow-up Appointments ppointment in 1 week. - Dr. Mikey Bussing and [place], Room 8 Tuesday Return A ppointment in 2 weeks. - Dr. Mikey Bussing and [place], Room 8 Tuesday Return A Anesthetic (In clinic) Topical Lidocaine 5% applied to wound bed Cellular or Tissue Based Products Wound #1 Right,Lateral Lower Leg Cellular or Tissue Based Product Type: - run insurance authorization for advance tissue products. Bathing/ Shower/ Hygiene May shower with protection but do not get wound dressing(s) wet. Edema Control - Lymphedema / SCD / Other Elevate legs to the level of the heart or above for 30 minutes daily and/or when sitting, a frequency of: - 3-4 times a day throughout the day. Avoid standing for long periods of time. Exercise regularly Wound Treatment Wound #1 - Lower Leg Wound Laterality: Right, Lateral Cleanser: Soap and Water 1 x Per Week/30 Days Discharge Instructions: May shower and wash wound with dial antibacterial soap and water prior to dressing change. Cleanser: Wound Cleanser 1 x Per Week/30 Days Discharge Instructions: Cleanse the wound with wound cleanser prior  to applying a clean dressing using gauze sponges, not tissue or cotton balls. Peri-Wound Care: Sween Lotion (Moisturizing lotion) 1 x Per Week/30 Days Discharge Instructions: Apply moisturizing lotion as directed Topical: Gentamicin 1 x Per Week/30 Days Discharge Instructions: As directed by physician Prim Dressing: PolyMem Silver Non-Adhesive Dressing, 4.25x4.25 in 1 x Per Week/30 Days ary Discharge Instructions: Apply to wound bed as instructed Secondary Dressing: ABD Pad, 8x10 (Home Health) 1 x Per Week/30 Days Discharge Instructions: Apply over primary dressing as directed. Secondary Dressing: Woven Gauze Sponge, Non-Sterile 4x4 in (Home Health) 1 x Per Week/30 Days Discharge Instructions: Apply over primary dressing as directed. Compression Wrap: Kerlix  Roll 4.5x3.1 (in/yd) (Home Health) 1 x Per Week/30 Days Discharge Instructions: Apply Kerlix and Coban compression as directed. Compression Wrap: Coban Self-Adherent Wrap 4x5 (in/yd) (Home Health) 1 x Per Week/30 Days Discharge Instructions: Apply over Kerlix as directed. Patient Medications llergies: penicillin, codeine, Sulfa (Sulfonamide Antibiotics), latex, penbutolol A Notifications Medication Indication Start End 11/24/2021 lidocaine DOSE topical 5 % gel - gel topical every week applied only in clinic for debridements. Electronic Signature(s) Signed: 11/24/2021 4:01:39 PM By: Kalman Shan DO Entered By: Kalman Shan on 11/24/2021 15:59:01 -------------------------------------------------------------------------------- Problem List Details Patient Name: Date of Service: Vicki Rhymes T. 11/24/2021 3:00 PM Medical Record Number: [id number] Patient Account Number: [id number] Date of Birth/Sex: Treating RN: 09-28-1923 (88 y.o. [name] Primary Care Provider: Mortimer Fries Other Clinician: Referring Provider: Treating Provider/Extender: Gust Rung in Treatment: 5 Active  Problems ICD-10 Encounter Code Description Active Date MDM Diagnosis [phone] Non-pressure chronic ulcer of other part of right lower leg with fat layer 10/20/2021 No Yes exposed T79.8XXA Other early complications of trauma, initial encounter 10/20/2021 No Yes E03.9 Hypothyroidism, unspecified 10/20/2021 No Yes I87.311 Chronic venous hypertension (idiopathic) with ulcer of right lower extremity 10/20/2021 No Yes Inactive Problems Resolved Problems Electronic Signature(s) Signed: 11/24/2021 4:01:39 PM By: Kalman Shan DO Entered By: Kalman Shan on 11/24/2021 15:57:41 -------------------------------------------------------------------------------- Progress Note Details Patient Name: Date of Service: Vicki Rhymes T. 11/24/2021 3:00 PM Medical Record Number: [id number] Patient Account Number: [id number] Date of Birth/Sex: Treating RN: 06-Jan-1924 (88 y.o. [name] Primary Care Provider: Mortimer Fries Other Clinician: Referring Provider: Treating Provider/Extender: Gust Rung in Treatment: 5 Subjective Chief Complaint Information obtained from Patient 10/20/2021; right lower extremity wound status post trauma History of Present Illness (HPI) Admission 10/20/2021 Ms. Floride Hutmacher is a 88 year old female with a past medical history of hypothyroidism and essential hypertension that presents to the clinic for a 34-month history of ulcer to the right lower extremity. On 08/15/2021 the patient experienced a mechanical fall and hit her right leg against her table. She developed a hematoma and she was evaluated in the ED for this issue. The hematoma was evacuated in the ED. She had x-rays of the tibia/fibula and ankle that showed no acute osseous abnormalities. She has been using silver alginate to the wound bed. She did have 2 rounds of doxycycline for this issue and recently completed her second course. She currently denies signs of  infection. 8/28; patient presents for follow-up. She has no issues or complaints today. We have been doing Santyl and Hydrofera Blue under Kerlix/Coban. She has home health that they start coming out once weekly. She denies signs of infection. 9/5; patient presents for follow-up. We have been doing Hydrofera Blue with antibiotic ointment under Kerlix/Coban. She has no issues or complaints today. 9/11; patient presents for follow-up. We have been doing Hydrofera Blue under Kerlix/Coban. Home health has been changing the dressing weekly. The wrap was too tight at the more proximal and cutting into the patient's skin. Other than that she has no issues or complaints today. 9/18; patient presents for follow-up. We have been using PolyMem silver under Kerlix/Coban for the past week. She has no issues or complaints today. 9/25; patient presents for follow-up. We have been using PolyMem silver under Kerlix/Coban. She has no issues or complaints today. Patient History Information obtained from Patient. Family History Diabetes - Child, Heart Disease - Child,Mother,Father,Siblings, Hypertension - Father,Mother, No family history of Cancer, Hereditary  Spherocytosis, Kidney Disease, Lung Disease, Seizures, Stroke, Thyroid Problems, Tuberculosis. Social History Never smoker, Marital Status - Widowed, Alcohol Use - Never, Drug Use - No History, Caffeine Use - Rarely. Medical History Eyes Denies history of Cataracts, Glaucoma, Optic Neuritis Ear/Nose/Mouth/Throat Denies history of Chronic sinus problems/congestion, Middle ear problems Hematologic/Lymphatic Denies history of Anemia, Hemophilia, Human Immunodeficiency Virus, Lymphedema, Sickle Cell Disease Respiratory Denies history of Aspiration, Asthma, Chronic Obstructive Pulmonary Disease (COPD), Pneumothorax, Sleep Apnea, Tuberculosis Cardiovascular Patient has history of Congestive Heart Failure, Hypertension Denies history of Angina, Arrhythmia,  Coronary Artery Disease, Deep Vein Thrombosis, Hypotension, Myocardial Infarction, Peripheral Arterial Disease, Peripheral Venous Disease, Phlebitis, Vasculitis Gastrointestinal Denies history of Cirrhosis , Colitis, Crohnoos, Hepatitis A, Hepatitis B, Hepatitis C Endocrine Denies history of Type I Diabetes, Type II Diabetes Immunological Denies history of Lupus Erythematosus, Raynaudoos, Scleroderma Integumentary (Skin) Denies history of History of Burn Musculoskeletal Patient has history of Osteoarthritis Denies history of Gout, Rheumatoid Arthritis, Osteomyelitis Neurologic Patient has history of Neuropathy Denies history of Dementia, Quadriplegia, Paraplegia, Seizure Disorder Psychiatric Denies history of Anorexia/bulimia, Confinement Anxiety Hospitalization/Surgery History - back surgery. - right hip surgery. - hysterotomy. Medical A Surgical History Notes nd Ear/Nose/Mouth/Throat hearing aides Genitourinary Stage three Objective Constitutional respirations regular, non-labored and within target range for patient.. Vitals Time Taken: 3:25 PM, Height: 62 in, Weight: 142 lbs, BMI: 26, Temperature: 97.9 F, Pulse: 66 bpm, Respiratory Rate: 18 breaths/min, Blood Pressure: 124/73 mmHg. Cardiovascular 2+ dorsalis pedis/posterior tibialis pulses. Psychiatric pleasant and cooperative. General Notes: Right lower extremity: T the anterior aspect there is a large open wound with granulation tissue,and nonviable tissue. No signs of surrounding o infection. Venous stasis dermatitis. She does have some small areas that are darkened however I think this is from the previous silver nitrate. Integumentary (Hair, Skin) Wound #1 status is Open. Original cause of wound was Trauma. The date acquired was: 08/04/2021. The wound has been in treatment 5 weeks. The wound is located on the Right,Lateral Lower Leg. The wound measures 11.6cm length x 3.8cm width x 0.1cm depth; 34.62cm^2 area and  3.462cm^3 volume. There is Fat Layer (Subcutaneous Tissue) exposed. There is no tunneling or undermining noted. There is a medium amount of serosanguineous drainage noted. The wound margin is distinct with the outline attached to the wound base. There is large (67-100%) red, pink granulation within the wound bed. There is a small (1-33%) amount of necrotic tissue within the wound bed including Adherent Slough. Assessment Active Problems ICD-10 Non-pressure chronic ulcer of other part of right lower leg with fat layer exposed Other early complications of trauma, initial encounter Hypothyroidism, unspecified Chronic venous hypertension (idiopathic) with ulcer of right lower extremity Patient's wound has shown improvement in size and appearance since last clinic visit. I debrided nonviable tissue. I recommended continuing the course with PolyMem silver but will add gentamicin to address any bioburden. Continue Kerlix/Coban. Follow-up in 1 week. Procedures Wound #1 Pre-procedure diagnosis of Wound #1 is an Abrasion located on the Right,Lateral Lower Leg . There was a Excisional Skin/Subcutaneous Tissue Debridement with a total area of 44.08 sq cm performed by Geralyn CorwinHoffman, Ally Knodel, DO. With the following instrument(s): Curette to remove Viable and Non-Viable tissue/material. Material removed includes Subcutaneous Tissue and Slough and after achieving pain control using Lidocaine 5% topical ointment. A time out was conducted at 15:40, prior to the start of the procedure. A Minimum amount of bleeding was controlled with Pressure. The procedure was tolerated well with a pain level of 0 throughout and a pain  level of 0 following the procedure. Post Debridement Measurements: 11.6cm length x 3.8cm width x 0.1cm depth; 3.462cm^3 volume. Character of Wound/Ulcer Post Debridement is improved. Post procedure Diagnosis Wound #1: Same as Pre-Procedure Plan Follow-up Appointments: Return Appointment in 1 week. -  Dr. Mikey Bussing and [place], Room 8 Tuesday Return Appointment in 2 weeks. - Dr. Mikey Bussing and [place], Room 8 Tuesday Anesthetic: (In clinic) Topical Lidocaine 5% applied to wound bed Cellular or Tissue Based Products: Wound #1 Right,Lateral Lower Leg: Cellular or Tissue Based Product Type: - run insurance authorization for advance tissue products. Bathing/ Shower/ Hygiene: May shower with protection but do not get wound dressing(s) wet. Edema Control - Lymphedema / SCD / Other: Elevate legs to the level of the heart or above for 30 minutes daily and/or when sitting, a frequency of: - 3-4 times a day throughout the day. Avoid standing for long periods of time. Exercise regularly The following medication(s) was prescribed: lidocaine topical 5 % gel gel topical every week applied only in clinic for debridements. was prescribed at facility WOUND #1: - Lower Leg Wound Laterality: Right, Lateral Cleanser: Soap and Water 1 x Per Week/30 Days Discharge Instructions: May shower and wash wound with dial antibacterial soap and water prior to dressing change. Cleanser: Wound Cleanser 1 x Per Week/30 Days Discharge Instructions: Cleanse the wound with wound cleanser prior to applying a clean dressing using gauze sponges, not tissue or cotton balls. Peri-Wound Care: Sween Lotion (Moisturizing lotion) 1 x Per Week/30 Days Discharge Instructions: Apply moisturizing lotion as directed Topical: Gentamicin 1 x Per Week/30 Days Discharge Instructions: As directed by physician Prim Dressing: PolyMem Silver Non-Adhesive Dressing, 4.25x4.25 in 1 x Per Week/30 Days ary Discharge Instructions: Apply to wound bed as instructed Secondary Dressing: ABD Pad, 8x10 (Home Health) 1 x Per Week/30 Days Discharge Instructions: Apply over primary dressing as directed. Secondary Dressing: Woven Gauze Sponge, Non-Sterile 4x4 in (Home Health) 1 x Per Week/30 Days Discharge Instructions: Apply over primary dressing as directed. Com  pression Wrap: Kerlix Roll 4.5x3.1 (in/yd) (Home Health) 1 x Per Week/30 Days Discharge Instructions: Apply Kerlix and Coban compression as directed. Com pression Wrap: Coban Self-Adherent Wrap 4x5 (in/yd) (Home Health) 1 x Per Week/30 Days Discharge Instructions: Apply over Kerlix as directed. 1. In office sharp debridement 2. Gentamicin ointment with PolyMem silver under Kerlix/Coban 3. Follow-up in 1 week Electronic Signature(s) Signed: 11/24/2021 4:01:39 PM By: Geralyn Corwin DO Entered By: Geralyn Corwin on 11/24/2021 15:59:46 -------------------------------------------------------------------------------- HxROS Details Patient Name: Date of Service: Vicki [name] 11/24/2021 3:00 PM Medical Record Number: [id number] Patient Account Number: [id number] Date of Birth/Sex: Treating RN: Aug 07, 1923 (88 y.o. Vicki [name] Primary Care Provider: Sonia [name] Other Clinician: Referring Provider: Treating Provider/Extender: Radene Ou in Treatment: 5 Information Obtained From Patient Eyes Medical History: Negative for: Cataracts; Glaucoma; Optic Neuritis Ear/Nose/Mouth/Throat Medical History: Negative for: Chronic sinus problems/congestion; Middle ear problems Past Medical History Notes: hearing aides Hematologic/Lymphatic Medical History: Negative for: Anemia; Hemophilia; Human Immunodeficiency Virus; Lymphedema; Sickle Cell Disease Respiratory Medical History: Negative for: Aspiration; Asthma; Chronic Obstructive Pulmonary Disease (COPD); Pneumothorax; Sleep Apnea; Tuberculosis Cardiovascular Medical History: Positive for: Congestive Heart Failure; Hypertension Negative for: Angina; Arrhythmia; Coronary Artery Disease; Deep Vein Thrombosis; Hypotension; Myocardial Infarction; Peripheral Arterial Disease; Peripheral Venous Disease; Phlebitis; Vasculitis Gastrointestinal Medical History: Negative for: Cirrhosis ; Colitis; Crohns;  Hepatitis A; Hepatitis B; Hepatitis C Endocrine Medical History: Negative for: Type I Diabetes; Type II Diabetes Genitourinary Medical History: Past  Medical History Notes: Stage three Immunological Medical History: Negative for: Lupus Erythematosus; Raynauds; Scleroderma Integumentary (Skin) Medical History: Negative for: History of Burn Musculoskeletal Medical History: Positive for: Osteoarthritis Negative for: Gout; Rheumatoid Arthritis; Osteomyelitis Neurologic Medical History: Positive for: Neuropathy Negative for: Dementia; Quadriplegia; Paraplegia; Seizure Disorder Psychiatric Medical History: Negative for: Anorexia/bulimia; Confinement Anxiety Immunizations Pneumococcal Vaccine: Received Pneumococcal Vaccination: Yes Received Pneumococcal Vaccination On or After 60th Birthday: Yes Implantable Devices Yes Hospitalization / Surgery History Type of Hospitalization/Surgery back surgery right hip surgery hysterotomy Family and Social History Cancer: No; Diabetes: Yes - Child; Heart Disease: Yes - Child,Mother,Father,Siblings; Hereditary Spherocytosis: No; Hypertension: Yes - Father,Mother; Kidney Disease: No; Lung Disease: No; Seizures: No; Stroke: No; Thyroid Problems: No; Tuberculosis: No; Never smoker; Marital Status - Widowed; Alcohol Use: Never; Drug Use: No History; Caffeine Use: Rarely; Financial Concerns: No; Food, Clothing or Shelter Needs: No; Support System Lacking: No; Transportation Concerns: No Electronic Signature(s) Signed: 11/24/2021 4:01:39 PM By: Geralyn Corwin DO Signed: 11/24/2021 5:50:10 PM By: Shawn Stall RN, BSN Entered By: Geralyn Corwin on 11/24/2021 15:58:12 -------------------------------------------------------------------------------- SuperBill Details Patient Name: Date of Service: Vicki [name] 11/24/2021 Medical Record Number: [id number] Patient Account Number: [id number] Date of Birth/Sex: Treating RN: 05-Nov-1923 (88  y.o. Vicki [name] Primary Care Provider: Sonia [name] Other Clinician: Referring Provider: Treating Provider/Extender: Radene Ou in Treatment: 5 Diagnosis Coding ICD-10 Codes Code Description [phone] Non-pressure chronic ulcer of other part of right lower leg with fat layer exposed T79.8XXA Other early complications of trauma, initial encounter E03.9 Hypothyroidism, unspecified I87.311 Chronic venous hypertension (idiopathic) with ulcer of right lower extremity Facility Procedures CPT4 Code: [id number] Description: 11042 - DEB SUBQ TISSUE 20 SQ CM/< ICD-10 Diagnosis Description L97.812 Non-pressure chronic ulcer of other part of right lower leg with fat layer exp Modifier: osed Quantity: 1 CPT4 Code: [id number] Description: 11045 - DEB SUBQ TISS EA ADDL 20CM ICD-10 Diagnosis Description L97.812 Non-pressure chronic ulcer of other part of right lower leg with fat layer exp Modifier: osed Quantity: 2 Physician Procedures : CPT4 Code Description Modifier [id number] 11042 - WC PHYS SUBQ TISS 20 SQ CM ICD-10 Diagnosis Description L97.812 Non-pressure chronic ulcer of other part of right lower leg with fat layer exposed Quantity: 1 : [id number] 11045 - WC PHYS SUBQ TISS EA ADDL 20 CM ICD-10 Diagnosis Description L97.812 Non-pressure chronic ulcer of other part of right lower leg with fat layer exposed Quantity: 2 Electronic Signature(s) Signed: 11/24/2021 4:01:39 PM By: Geralyn Corwin DO Entered By: Geralyn Corwin on 11/24/2021 15:59:56

## 2021-11-25 NOTE — Progress Notes (Signed)
MAYTHE, DERAMO T ([id number]) . Visit Report for 11/24/2021 Arrival Information Details Patient Name: Date of Service: Vicki [name], Vicki T. 11/24/2021 3:00 PM Medical Record Number: [id number] Patient Account Number: [id number] Date of Birth/Sex: Treating RN: Jul 08, 1923 (88 y.o. Vicki [name], Vicki [name] Primary Care Akiko Schexnider: Mortimer Fries Other Clinician: Referring Angelyne Terwilliger: Treating Sayvion Vigen/Extender: Gust Rung in Treatment: 5 Visit Information History Since Last Visit Added or deleted any medications: No Patient Arrived: Vicki [name] Any new allergies or adverse reactions: No Arrival Time: 15:25 Had a fall or experienced change in No Accompanied By: daughter in [place] activities of daily living that may affect Transfer Assistance: None risk of falls: Patient Identification Verified: Yes Signs or symptoms of abuse/neglect since last visito No Secondary Verification Process Completed: Yes Hospitalized since last visit: No Patient Requires Transmission-Based Precautions: No Implantable device outside of the clinic excluding No Patient Has Alerts: No cellular tissue based products placed in the center since last visit: Has Compression in Place as Prescribed: Yes Pain Present Now: No Electronic Signature(s) Signed: 11/25/2021 4:43:35 PM By: Erenest Blank Entered By: Erenest Blank on 11/24/2021 15:25:47 -------------------------------------------------------------------------------- Encounter Discharge Information Details Patient Name: Date of Service: Vicki Rhymes T. 11/24/2021 3:00 PM Medical Record Number: [id number] Patient Account Number: [id number] Date of Birth/Sex: Treating RN: [birth year] (88 y.o. Vicki [name], Vicki [name] Primary Care Donella Pascarella: Mortimer Fries Other Clinician: Referring Devaunte Gasparini: Treating Dierdre Mccalip/Extender: Gust Rung in Treatment: 5 Encounter Discharge Information Items Post Procedure Vitals Discharge Condition:  Stable Temperature (F): 97.9 Ambulatory Status: Walker Pulse (bpm): 66 Discharge Destination: Home Respiratory Rate (breaths/min): 18 Transportation: Private Auto Blood Pressure (mmHg): 124/73 Accompanied By: daughter Schedule Follow-up Appointment: Yes Clinical Summary of Care: Electronic Signature(s) Signed: 11/24/2021 5:50:10 PM By: Deon Pilling RN, BSN Entered By: Deon Pilling on 11/24/2021 15:49:17 -------------------------------------------------------------------------------- Lower Extremity Assessment Details Patient Name: Date of Service: Vicki Rhymes T. 11/24/2021 3:00 PM Medical Record Number: [id number] Patient Account Number: [id number] Date of Birth/Sex: Treating RN: Oct 09, 1923 (88 y.o. Vicki [name], [name] Primary Care Omair Dettmer: Mortimer Fries Other Clinician: Referring Monea Pesantez: Treating Breylan Lefevers/Extender: Gust Rung in Treatment: 5 Edema Assessment Assessed: [Left: No] [Right: No] Edema: [Left: N] [Right: o] Calf Left: Right: Point of Measurement: 28 cm From Medial Instep 34.5 cm Ankle Left: Right: Point of Measurement: 12 cm From Medial Instep 22 cm Vascular Assessment Pulses: Dorsalis Pedis Palpable: [Right:Yes] Electronic Signature(s) Signed: 11/24/2021 5:50:10 PM By: Deon Pilling RN, BSN Signed: 11/25/2021 4:43:35 PM By: Erenest Blank Entered By: Erenest Blank on 11/24/2021 15:39:49 -------------------------------------------------------------------------------- Multi Wound Chart Details Patient Name: Date of Service: Vicki Rhymes T. 11/24/2021 3:00 PM Medical Record Number: [id number] Patient Account Number: [id number] Date of Birth/Sex: Treating RN: 1924/01/03 (88 y.o. Vicki [name], Vicki [name] Primary Care Aidynn Polendo: Mortimer Fries Other Clinician: Referring Ariele Vidrio: Treating Mai Longnecker/Extender: Gust Rung in Treatment: 5 Vital Signs Height(in): 62 Pulse(bpm): 109 Weight(lbs):  142 Blood Pressure(mmHg): 124/73 Body Mass Index(BMI): 26 Temperature(F): 97.9 Respiratory Rate(breaths/min): 18 Photos: [1:Right, Lateral Lower Leg] [N/A:N/A N/A] Wound Location: [1:Trauma] [N/A:N/A] Wounding Event: [1:Abrasion] [N/A:N/A] Primary Etiology: [1:Lymphedema] [N/A:N/A] Secondary Etiology: [1:Congestive Heart Failure,] [N/A:N/A] Comorbid History: [1:Hypertension, Osteoarthritis, Neuropathy 08/04/2021] [N/A:N/A] Date Acquired: [1:5] [N/A:N/A] Weeks of Treatment: [1:Open] [N/A:N/A] Wound Status: [1:No] [N/A:N/A] Wound Recurrence: [1:11.6x3.8x0.1] [N/A:N/A] Measurements L x W x D (cm) [1:34.62] [N/A:N/A] A (cm) : rea [1:3.462] [N/A:N/A] Volume (cm) : [1:35.20%] [N/A:N/A] % Reduction in A [1:rea: 87.00%] [N/A:N/A] % Reduction  in Volume: [1:Full Thickness Without Exposed] [N/A:N/A] Classification: [1:Support Structures Medium] [N/A:N/A] Exudate A mount: [1:Serosanguineous] [N/A:N/A] Exudate Type: [1:red, brown] [N/A:N/A] Exudate Color: [1:Distinct, outline attached] [N/A:N/A] Wound Margin: [1:Large (67-100%)] [N/A:N/A] Granulation A mount: [1:Red, Pink] [N/A:N/A] Granulation Quality: [1:Small (1-33%)] [N/A:N/A] Necrotic A mount: [1:Fat Layer (Subcutaneous Tissue): Yes N/A] Exposed Structures: [1:Fascia: No Tendon: No Muscle: No Joint: No Bone: No Small (1-33%)] [N/A:N/A] Epithelialization: [1:Debridement - Excisional] [N/A:N/A] Debridement: Pre-procedure Verification/Time Out 15:40 [N/A:N/A] Taken: [1:Lidocaine 5% topical ointment] [N/A:N/A] Pain Control: [1:Subcutaneous, Slough] [N/A:N/A] Tissue Debrided: [1:Skin/Subcutaneous Tissue] [N/A:N/A] Level: [1:44.08] [N/A:N/A] Debridement A (sq cm): [1:rea Curette] [N/A:N/A] Instrument: [1:Minimum] [N/A:N/A] Bleeding: [1:Pressure] [N/A:N/A] Hemostasis A chieved: [1:0] [N/A:N/A] Procedural Pain: [1:0] [N/A:N/A] Post Procedural Pain: [1:Procedure was tolerated well] [N/A:N/A] Debridement Treatment Response:  [1:11.6x3.8x0.1] [N/A:N/A] Post Debridement Measurements L x W x D (cm) [1:3.462] [N/A:N/A] Post Debridement Volume: (cm) [1:Debridement] [N/A:N/A] Treatment Notes Wound #1 (Lower Leg) Wound Laterality: Right, Lateral Cleanser Soap and Water Discharge Instruction: May shower and wash wound with dial antibacterial soap and water prior to dressing change. Wound Cleanser Discharge Instruction: Cleanse the wound with wound cleanser prior to applying a clean dressing using gauze sponges, not tissue or cotton balls. Peri-Wound Care Sween Lotion (Moisturizing lotion) Discharge Instruction: Apply moisturizing lotion as directed Topical Gentamicin Discharge Instruction: As directed by physician Primary Dressing PolyMem Silver Non-Adhesive Dressing, 4.25x4.25 in Discharge Instruction: Apply to wound bed as instructed Secondary Dressing ABD Pad, 8x10 Discharge Instruction: Apply over primary dressing as directed. Woven Gauze Sponge, Non-Sterile 4x4 in Discharge Instruction: Apply over primary dressing as directed. Secured With Compression Wrap Kerlix Roll 4.5x3.1 (in/yd) Discharge Instruction: Apply Kerlix and Coban compression as directed. Coban Self-Adherent Wrap 4x5 (in/yd) Discharge Instruction: Apply over Kerlix as directed. Compression Stockings Add-Ons Electronic Signature(s) Signed: 11/24/2021 4:01:39 PM By: Kalman Shan DO Signed: 11/24/2021 5:50:10 PM By: Deon Pilling RN, BSN Entered By: Kalman Shan on 11/24/2021 15:57:45 -------------------------------------------------------------------------------- Multi-Disciplinary Care Plan Details Patient Name: Date of Service: Vicki Rhymes T. 11/24/2021 3:00 PM Medical Record Number: [id number] Patient Account Number: [id number] Date of Birth/Sex: Treating RN: 25-Sep-1923 (88 y.o. Vicki [name], Vicki [name] Primary Care Osborn Pullin: Mortimer Fries Other Clinician: Referring Brach Birdsall: Treating Jerita Wimbush/Extender: Gust Rung in Treatment: 5 Active Inactive Nutrition Nursing Diagnoses: Potential for alteratiion in Nutrition/Potential for imbalanced nutrition Goals: Patient/caregiver agrees to and verbalizes understanding of need to obtain nutritional consultation Date Initiated: 10/20/2021 Target Resolution Date: 12/26/2021 Goal Status: Active Interventions: Provide education on nutrition Treatment Activities: Education provided on Nutrition : 10/20/2021 Patient referred to Primary Care Physician for further nutritional evaluation : 10/20/2021 Notes: Pain, Acute or Chronic Nursing Diagnoses: Pain, acute or chronic: actual or potential Potential alteration in comfort, pain Goals: Patient will verbalize adequate pain control and receive pain control interventions during procedures as needed Date Initiated: 10/20/2021 Target Resolution Date: 12/26/2021 Goal Status: Active Patient/caregiver will verbalize comfort level met Date Initiated: 10/20/2021 Date Inactivated: 11/04/2021 Target Resolution Date: 11/06/2021 Goal Status: Met Interventions: Assess comfort goal upon admission Provide education on pain management Treatment Activities: Administer pain control measures as ordered : 10/20/2021 Notes: Wound/Skin Impairment Nursing Diagnoses: Knowledge deficit related to ulceration/compromised skin integrity Goals: Patient/caregiver will verbalize understanding of skin care regimen Date Initiated: 10/20/2021 Target Resolution Date: 12/26/2021 Goal Status: Active Interventions: Assess patient/caregiver ability to perform ulcer/skin care regimen upon admission and as needed Assess ulceration(s) every visit Provide education on ulcer and skin care Treatment Activities: Skin care regimen initiated : 10/20/2021 Topical wound management  initiated : 10/20/2021 Notes: Electronic Signature(s) Signed: 11/24/2021 5:50:10 PM By: Deon Pilling RN, BSN Entered By: Deon Pilling on  11/24/2021 15:46:18 -------------------------------------------------------------------------------- Pain Assessment Details Patient Name: Date of Service: Vicki Rhymes T. 11/24/2021 3:00 PM Medical Record Number: [id number] Patient Account Number: [id number] Date of Birth/Sex: Treating RN: 07/09/1923 (88 y.o. Vicki [name], [name] Primary Care Akhilesh Sassone: Mortimer Fries Other Clinician: Referring Etheleen Valtierra: Treating Ruthene Methvin/Extender: Gust Rung in Treatment: 5 Active Problems Location of Pain Severity and Description of Pain Patient Has Paino No Site Locations Pain Management and Medication Current Pain Management: Electronic Signature(s) Signed: 11/24/2021 5:50:10 PM By: Deon Pilling RN, BSN Signed: 11/25/2021 4:43:35 PM By: Erenest Blank Entered By: Erenest Blank on 11/24/2021 15:26:11 -------------------------------------------------------------------------------- Patient/Caregiver Education Details Patient Name: Date of Service: Sheliah Hatch 9/25/2023andnbsp3:00 PM Medical Record Number: [id number] Patient Account Number: [id number] Date of Birth/Gender: Treating RN: 03-31-1923 (88 y.o. Vicki [name] Primary Care Physician: Mortimer Fries Other Clinician: Referring Physician: Treating Physician/Extender: Gust Rung in Treatment: 5 Education Assessment Education Provided To: Patient Education Topics Provided Nutrition: Handouts: Nutrition Methods: Explain/Verbal Responses: Reinforcements needed Electronic Signature(s) Signed: 11/24/2021 5:50:10 PM By: Deon Pilling RN, BSN Entered By: Deon Pilling on 11/24/2021 15:46:31 -------------------------------------------------------------------------------- Wound Assessment Details Patient Name: Date of Service: Vicki Rhymes T. 11/24/2021 3:00 PM Medical Record Number: [id number] Patient Account Number: [id number] Date of Birth/Sex: Treating  RN: 1924/01/05 (88 y.o. Vicki [name], Vicki [name] Primary Care Kenia Teagarden: Mortimer Fries Other Clinician: Referring Delonte Musich: Treating Deneka Greenwalt/Extender: Gust Rung in Treatment: 5 Wound Status Wound Number: 1 Primary Etiology: Abrasion Wound Location: Right, Lateral Lower Leg Secondary Lymphedema Etiology: Wounding Event: Trauma Wound Status: Open Date Acquired: 08/04/2021 Comorbid Congestive Heart Failure, Hypertension, Osteoarthritis, Weeks Of Treatment: 5 History: Neuropathy Clustered Wound: No Photos Wound Measurements Length: (cm) 11.6 Width: (cm) 3.8 Depth: (cm) 0.1 Area: (cm) 34.62 Volume: (cm) 3.462 % Reduction in Area: 35.2% % Reduction in Volume: 87% Epithelialization: Small (1-33%) Tunneling: No Undermining: No Wound Description Classification: Full Thickness Without Exposed Support Structures Wound Margin: Distinct, outline attached Exudate Amount: Medium Exudate Type: Serosanguineous Exudate Color: red, brown Foul Odor After Cleansing: No Slough/Fibrino Yes Wound Bed Granulation Amount: Large (67-100%) Exposed Structure Granulation Quality: Red, Pink Fascia Exposed: No Necrotic Amount: Small (1-33%) Fat Layer (Subcutaneous Tissue) Exposed: Yes Necrotic Quality: Adherent Slough Tendon Exposed: No Muscle Exposed: No Joint Exposed: No Bone Exposed: No Treatment Notes Wound #1 (Lower Leg) Wound Laterality: Right, Lateral Cleanser Soap and Water Discharge Instruction: May shower and wash wound with dial antibacterial soap and water prior to dressing change. Wound Cleanser Discharge Instruction: Cleanse the wound with wound cleanser prior to applying a clean dressing using gauze sponges, not tissue or cotton balls. Peri-Wound Care Sween Lotion (Moisturizing lotion) Discharge Instruction: Apply moisturizing lotion as directed Topical Gentamicin Discharge Instruction: As directed by physician Primary Dressing PolyMem Silver  Non-Adhesive Dressing, 4.25x4.25 in Discharge Instruction: Apply to wound bed as instructed Secondary Dressing ABD Pad, 8x10 Discharge Instruction: Apply over primary dressing as directed. Woven Gauze Sponge, Non-Sterile 4x4 in Discharge Instruction: Apply over primary dressing as directed. Secured With Compression Wrap Kerlix Roll 4.5x3.1 (in/yd) Discharge Instruction: Apply Kerlix and Coban compression as directed. Coban Self-Adherent Wrap 4x5 (in/yd) Discharge Instruction: Apply over Kerlix as directed. Compression Stockings Add-Ons Electronic Signature(s) Signed: 11/24/2021 5:50:10 PM By: Deon Pilling RN, BSN Entered By: Deon Pilling on 11/24/2021 15:34:40 -------------------------------------------------------------------------------- Vitals Details Patient Name:  Date of Service: ELCIE, PELSTER 11/24/2021 3:00 PM Medical Record Number: [id number] Patient Account Number: [id number] Date of Birth/Sex: Treating RN: 03-02-1924 (88 y.o. Vicki [name], Vicki [name] Primary Care Shanelle Clontz: Mortimer Fries Other Clinician: Referring Davie Sagona: Treating Jacorian Golaszewski/Extender: Gust Rung in Treatment: 5 Vital Signs Time Taken: 15:25 Temperature (F): 97.9 Height (in): 62 Pulse (bpm): 66 Weight (lbs): 142 Respiratory Rate (breaths/min): 18 Body Mass Index (BMI): 26 Blood Pressure (mmHg): 124/73 Reference Range: 80 - 120 mg / dl Electronic Signature(s) Signed: 11/25/2021 4:43:35 PM By: Erenest Blank Entered By: Erenest Blank on 11/24/2021 15:26:05

## 2021-12-01 ENCOUNTER — Encounter (HOSPITAL_BASED_OUTPATIENT_CLINIC_OR_DEPARTMENT_OTHER): Payer: Medicare Other | Attending: Internal Medicine | Admitting: Internal Medicine

## 2021-12-01 DIAGNOSIS — L97812 Non-pressure chronic ulcer of other part of right lower leg with fat layer exposed: Secondary | ICD-10-CM | POA: Diagnosis present

## 2021-12-01 DIAGNOSIS — W19XXXA Unspecified fall, initial encounter: Secondary | ICD-10-CM | POA: Insufficient documentation

## 2021-12-01 DIAGNOSIS — E039 Hypothyroidism, unspecified: Secondary | ICD-10-CM | POA: Diagnosis not present

## 2021-12-01 DIAGNOSIS — T798XXA Other early complications of trauma, initial encounter: Secondary | ICD-10-CM | POA: Diagnosis not present

## 2021-12-01 DIAGNOSIS — I509 Heart failure, unspecified: Secondary | ICD-10-CM | POA: Insufficient documentation

## 2021-12-01 DIAGNOSIS — I87311 Chronic venous hypertension (idiopathic) with ulcer of right lower extremity: Secondary | ICD-10-CM | POA: Diagnosis not present

## 2021-12-01 DIAGNOSIS — I110 Hypertensive heart disease with heart failure: Secondary | ICD-10-CM | POA: Diagnosis not present

## 2021-12-01 NOTE — Progress Notes (Signed)
Vicki [name], Vicki [name] ([id number]) . Visit Report for 12/01/2021 Arrival Information Details Patient Name: Date of Service: Vicki [name], Vicki [name] 12/01/2021 12:45 PM Medical Record Number: [id number] Patient Account Number: [id number] Date of Birth/Sex: Treating RN: 23-May-1923 (88 y.o. F) Primary Care Renell Allum: Elenor Quinones Other Clinician: Referring Raygen Dahm: Treating Toneka Fullen/Extender: Gust Rung in Treatment: 6 Visit Information History Since Last Visit Added or deleted any medications: No Patient Arrived: Walker Any new allergies or adverse reactions: No Arrival Time: 12:49 Had a fall or experienced change in No Accompanied By: granddaughter activities of daily living that may affect Transfer Assistance: None risk of falls: Patient Identification Verified: Yes Signs or symptoms of abuse/neglect since last visito No Secondary Verification Process Completed: Yes Hospitalized since last visit: No Patient Requires Transmission-Based Precautions: No Implantable device outside of the clinic excluding No Patient Has Alerts: No cellular tissue based products placed in the center since last visit: Has Compression in Place as Prescribed: Yes Pain Present Now: No Electronic Signature(s) Signed: 12/01/2021 4:49:57 PM By: Erenest Blank Entered By: Erenest Blank on 12/01/2021 12:50:36 -------------------------------------------------------------------------------- Encounter Discharge Information Details Patient Name: Date of Service: Vicki [name]. 12/01/2021 12:45 PM Medical Record Number: [id number] Patient Account Number: [id number] Date of Birth/Sex: Treating RN: 1923/06/29 (88 y.o. [name], [name] Primary Care Jonica Bickhart: Elenor Quinones Other Clinician: Referring Chandlor Noecker: Treating Conchetta Lamia/Extender: Gust Rung in Treatment: 6 Encounter Discharge Information Items Post Procedure Vitals Discharge Condition:  Stable Temperature (F): 98 Ambulatory Status: Walker Pulse (bpm): 57 Discharge Destination: Home Respiratory Rate (breaths/min): 20 Transportation: Private Auto Blood Pressure (mmHg): 148/70 Accompanied By: self Schedule Follow-up Appointment: Yes Clinical Summary of Care: Electronic Signature(s) Signed: 12/01/2021 4:42:08 PM By: Deon Pilling RN, BSN Entered By: Deon Pilling on 12/01/2021 13:25:19 -------------------------------------------------------------------------------- Lower Extremity Assessment Details Patient Name: Date of Service: Vicki [name]. 12/01/2021 12:45 PM Medical Record Number: [id number] Patient Account Number: [id number] Date of Birth/Sex: Treating RN: Sep 16, 1923 (88 y.o. F) Primary Care Troyce Gieske: Elenor Quinones Other Clinician: Referring Rahiem Schellinger: Treating Nasiir Monts/Extender: Gust Rung in Treatment: 6 Edema Assessment Assessed: [Left: No] [Right: No] Edema: [Left: N] [Right: o] Calf Left: Right: Point of Measurement: 28 cm From Medial Instep 33.6 cm Ankle Left: Right: Point of Measurement: 12 cm From Medial Instep 21.8 cm Electronic Signature(s) Signed: 12/01/2021 4:49:57 PM By: Erenest Blank Entered By: Erenest Blank on 12/01/2021 13:06:17 -------------------------------------------------------------------------------- Multi Wound Chart Details Patient Name: Date of Service: Vicki [name]. 12/01/2021 12:45 PM Medical Record Number: [id number] Patient Account Number: [id number] Date of Birth/Sex: Treating RN: 10-20-1923 (88 y.o. F) Primary Care Maranatha Grossi: Elenor Quinones Other Clinician: Referring Davinity Fanara: Treating Martise Waddell/Extender: Gust Rung in Treatment: 6 Vital Signs Height(in): 62 Pulse(bpm): 76 Weight(lbs): 142 Blood Pressure(mmHg): 148/70 Body Mass Index(BMI): 26 Temperature(F): 98 Respiratory Rate(breaths/min): 18 Photos: [N/A:N/A] Right, Lateral  Lower Leg N/A N/A Wound Location: Trauma N/A N/A Wounding Event: Abrasion N/A N/A Primary Etiology: Lymphedema N/A N/A Secondary Etiology: Congestive Heart Failure, N/A N/A Comorbid History: Hypertension, Osteoarthritis, Neuropathy 08/04/2021 N/A N/A Date Acquired: 6 N/A N/A Weeks of Treatment: Open N/A N/A Wound Status: No N/A N/A Wound Recurrence: 11x3.8x0.1 N/A N/A Measurements L x W x D (cm) 32.83 N/A N/A A (cm) : rea 3.283 N/A N/A Volume (cm) : 38.50% N/A N/A % Reduction in Area: 87.70% N/A N/A % Reduction in Volume: Full Thickness Without Exposed N/A N/A Classification: Support Structures Medium N/A N/A Exudate  A mount: Serosanguineous N/A N/A Exudate Type: red, brown N/A N/A Exudate Color: Distinct, outline attached N/A N/A Wound Margin: Large (67-100%) N/A N/A Granulation A mount: Red, Pink N/A N/A Granulation Quality: None Present (0%) N/A N/A Necrotic A mount: Fat Layer (Subcutaneous Tissue): Yes N/A N/A Exposed Structures: Fascia: No Tendon: No Muscle: No Joint: No Bone: No Small (1-33%) N/A N/A Epithelialization: Debridement - Excisional N/A N/A Debridement: Pre-procedure Verification/Time Out 13:15 N/A N/A Taken: Lidocaine 5% topical ointment N/A N/A Pain Control: Subcutaneous, Slough N/A N/A Tissue Debrided: Skin/Subcutaneous Tissue N/A N/A Level: 41.8 N/A N/A Debridement A (sq cm): rea Curette N/A N/A Instrument: Minimum N/A N/A Bleeding: 0 N/A N/A Procedural Pain: 0 N/A N/A Post Procedural Pain: Procedure was tolerated well N/A N/A Debridement Treatment Response: 11x3.8x0.1 N/A N/A Post Debridement Measurements L x W x D (cm) 3.283 N/A N/A Post Debridement Volume: (cm) Excoriation: No N/A N/A Periwound Skin Texture: Induration: No Callus: No Crepitus: No Rash: No Scarring: No Maceration: No N/A N/A Periwound Skin Moisture: Dry/Scaly: No Hemosiderin Staining: Yes N/A N/A Periwound Skin Color: Atrophie  Blanche: No Cyanosis: No Ecchymosis: No Erythema: No Mottled: No Pallor: No Rubor: No No Abnormality N/A N/A Temperature: Chemical Cauterization N/A N/A Procedures Performed: Debridement Treatment Notes Wound #1 (Lower Leg) Wound Laterality: Right, Lateral Cleanser Soap and Water Discharge Instruction: May shower and wash wound with dial antibacterial soap and water prior to dressing change. Wound Cleanser Discharge Instruction: Cleanse the wound with wound cleanser prior to applying a clean dressing using gauze sponges, not tissue or cotton balls. Peri-Wound Care Sween Lotion (Moisturizing lotion) Discharge Instruction: Apply moisturizing lotion as directed Topical Primary Dressing PolyMem Silver Non-Adhesive Dressing, 4.25x4.25 in Discharge Instruction: Apply to wound bed as instructed Secondary Dressing ABD Pad, 8x10 Discharge Instruction: Apply over primary dressing as directed. Woven Gauze Sponge, Non-Sterile 4x4 in Discharge Instruction: Apply over primary dressing as directed. Secured With Compression Wrap Kerlix Roll 4.5x3.1 (in/yd) Discharge Instruction: Apply Kerlix and Coban compression as directed. Coban Self-Adherent Wrap 4x5 (in/yd) Discharge Instruction: Apply over Kerlix as directed. Compression Stockings Add-Ons Electronic Signature(s) Signed: 12/01/2021 1:44:59 PM By: Kalman Shan DO Entered By: Kalman Shan on 12/01/2021 13:38:16 -------------------------------------------------------------------------------- Multi-Disciplinary Care Plan Details Patient Name: Date of Service: Vicki [name]. 12/01/2021 12:45 PM Medical Record Number: [id number] Patient Account Number: [id number] Date of Birth/Sex: Treating RN: May 18, 1923 (88 y.o. [name], [name] Primary Care Axel Meas: Elenor Quinones Other Clinician: Referring Reakwon Barren: Treating Carolena Fairbank/Extender: Gust Rung in Treatment: 6 Active  Inactive Nutrition Nursing Diagnoses: Potential for alteratiion in Nutrition/Potential for imbalanced nutrition Goals: Patient/caregiver agrees to and verbalizes understanding of need to obtain nutritional consultation Date Initiated: 10/20/2021 Target Resolution Date: 12/26/2021 Goal Status: Active Interventions: Provide education on nutrition Treatment Activities: Education provided on Nutrition : 11/24/2021 Patient referred to Primary Care Physician for further nutritional evaluation : 10/20/2021 Notes: Pain, Acute or Chronic Nursing Diagnoses: Pain, acute or chronic: actual or potential Potential alteration in comfort, pain Goals: Patient will verbalize adequate pain control and receive pain control interventions during procedures as needed Date Initiated: 10/20/2021 Target Resolution Date: 12/26/2021 Goal Status: Active Patient/caregiver will verbalize comfort level met Date Initiated: 10/20/2021 Date Inactivated: 11/04/2021 Target Resolution Date: 11/06/2021 Goal Status: Met Interventions: Assess comfort goal upon admission Provide education on pain management Treatment Activities: Administer pain control measures as ordered : 10/20/2021 Notes: Wound/Skin Impairment Nursing Diagnoses: Knowledge deficit related to ulceration/compromised skin integrity Goals: Patient/caregiver will verbalize understanding of skin care regimen  Date Initiated: 10/20/2021 Target Resolution Date: 12/26/2021 Goal Status: Active Interventions: Assess patient/caregiver ability to perform ulcer/skin care regimen upon admission and as needed Assess ulceration(s) every visit Provide education on ulcer and skin care Treatment Activities: Skin care regimen initiated : 10/20/2021 Topical wound management initiated : 10/20/2021 Notes: Electronic Signature(s) Signed: 12/01/2021 4:42:08 PM By: Deon Pilling RN, BSN Entered By: Deon Pilling on 12/01/2021  13:22:50 -------------------------------------------------------------------------------- Pain Assessment Details Patient Name: Date of Service: Vicki [name]. 12/01/2021 12:45 PM Medical Record Number: [id number] Patient Account Number: [id number] Date of Birth/Sex: Treating RN: October 01, 1923 (88 y.o. F) Primary Care Kynzlie Hilleary: Elenor Quinones Other Clinician: Referring Willisha Sligar: Treating Everette Mall/Extender: Gust Rung in Treatment: 6 Active Problems Location of Pain Severity and Description of Pain Patient Has Paino No Site Locations Pain Management and Medication Current Pain Management: Electronic Signature(s) Signed: 12/01/2021 4:49:57 PM By: Erenest Blank Entered By: Erenest Blank on 12/01/2021 12:53:30 -------------------------------------------------------------------------------- Patient/Caregiver Education Details Patient Name: Date of Service: Sheliah Hatch 10/2/2023andnbsp12:45 PM Medical Record Number: [id number] Patient Account Number: [id number] Date of Birth/Gender: Treating RN: 1923-04-20 (88 y.o. [name] Primary Care Physician: Elenor Quinones Other Clinician: Referring Physician: Treating Physician/Extender: Gust Rung in Treatment: 6 Education Assessment Education Provided To: Patient Education Topics Provided Wound/Skin Impairment: Handouts: Skin Care Do's and Dont's Methods: Explain/Verbal Responses: Reinforcements needed Electronic Signature(s) Signed: 12/01/2021 4:42:08 PM By: Deon Pilling RN, BSN Entered By: Deon Pilling on 12/01/2021 13:23:00 -------------------------------------------------------------------------------- Wound Assessment Details Patient Name: Date of Service: Vicki [name]. 12/01/2021 12:45 PM Medical Record Number: [id number] Patient Account Number: [id number] Date of Birth/Sex: Treating RN: 1923-08-27 (88 y.o. F) Primary Care  Chizuko Trine: Elenor Quinones Other Clinician: Referring Igor Bishop: Treating Maverick Patman/Extender: Gust Rung in Treatment: 6 Wound Status Wound Number: 1 Primary Etiology: Abrasion Wound Location: Right, Lateral Lower Leg Secondary Lymphedema Etiology: Wounding Event: Trauma Wound Status: Open Date Acquired: 08/04/2021 Comorbid Congestive Heart Failure, Hypertension, Osteoarthritis, Weeks Of Treatment: 6 History: Neuropathy Clustered Wound: No Photos Wound Measurements Length: (cm) 11 Width: (cm) 3.8 Depth: (cm) 0.1 Area: (cm) 32.83 Volume: (cm) 3.283 % Reduction in Area: 38.5% % Reduction in Volume: 87.7% Epithelialization: Small (1-33%) Tunneling: No Undermining: No Wound Description Classification: Full Thickness Without Exposed Support Structures Wound Margin: Distinct, outline attached Exudate Amount: Medium Exudate Type: Serosanguineous Exudate Color: red, brown Foul Odor After Cleansing: No Slough/Fibrino Yes Wound Bed Granulation Amount: Large (67-100%) Exposed Structure Granulation Quality: Red, Pink Fascia Exposed: No Necrotic Amount: None Present (0%) Fat Layer (Subcutaneous Tissue) Exposed: Yes Tendon Exposed: No Muscle Exposed: No Joint Exposed: No Bone Exposed: No Periwound Skin Texture Texture Color No Abnormalities Noted: No No Abnormalities Noted: No Callus: No Atrophie Blanche: No Crepitus: No Cyanosis: No Excoriation: No Ecchymosis: No Induration: No Erythema: No Rash: No Hemosiderin Staining: Yes Scarring: No Mottled: No Pallor: No Moisture Rubor: No No Abnormalities Noted: No Dry / Scaly: No Temperature / Pain Maceration: No Temperature: No Abnormality Treatment Notes Wound #1 (Lower Leg) Wound Laterality: Right, Lateral Cleanser Soap and Water Discharge Instruction: May shower and wash wound with dial antibacterial soap and water prior to dressing change. Wound Cleanser Discharge Instruction:  Cleanse the wound with wound cleanser prior to applying a clean dressing using gauze sponges, not tissue or cotton balls. Peri-Wound Care Sween Lotion (Moisturizing lotion) Discharge Instruction: Apply moisturizing lotion as directed Topical Primary Dressing PolyMem Silver Non-Adhesive Dressing, 4.25x4.25 in Discharge Instruction: Apply to wound  bed as instructed Secondary Dressing ABD Pad, 8x10 Discharge Instruction: Apply over primary dressing as directed. Woven Gauze Sponge, Non-Sterile 4x4 in Discharge Instruction: Apply over primary dressing as directed. Secured With Compression Wrap Kerlix Roll 4.5x3.1 (in/yd) Discharge Instruction: Apply Kerlix and Coban compression as directed. Coban Self-Adherent Wrap 4x5 (in/yd) Discharge Instruction: Apply over Kerlix as directed. Compression Stockings Add-Ons Electronic Signature(s) Signed: 12/01/2021 4:42:08 PM By: Deon Pilling RN, BSN Entered By: Deon Pilling on 12/01/2021 13:16:26 -------------------------------------------------------------------------------- Vitals Details Patient Name: Date of Service: Vicki [name]. 12/01/2021 12:45 PM Medical Record Number: [id number] Patient Account Number: [id number] Date of Birth/Sex: Treating RN: 02/06/1924 (88 y.o. F) Primary Care Larron Armor: Elenor Quinones Other Clinician: Referring Annelise Mccoy: Treating Hayes Rehfeldt/Extender: Gust Rung in Treatment: 6 Vital Signs Time Taken: 12:52 Temperature (F): 98 Height (in): 62 Pulse (bpm): 57 Weight (lbs): 142 Respiratory Rate (breaths/min): 18 Body Mass Index (BMI): 26 Blood Pressure (mmHg): 148/70 Reference Range: 80 - 120 mg / dl Electronic Signature(s) Signed: 12/01/2021 4:49:57 PM By: Erenest Blank Entered By: Erenest Blank on 12/01/2021 12:52:54

## 2021-12-01 NOTE — Progress Notes (Signed)
Vicki [name], Vicki [name] ([id number]) . Visit Report for 12/01/2021 Chief Complaint Document Details Patient Name: Date of Service: Vicki [name], Vicki [name] 12/01/2021 12:45 PM Medical Record Number: [id number] Patient Account Number: [id number] Date of Birth/Sex: Treating RN: 1923/10/29 (88 y.o. F) Primary Care Provider: Pricilla Holm Other Clinician: Referring Provider: Treating Provider/Extender: Radene Ou in Treatment: 6 Information Obtained from: Patient Chief Complaint 10/20/2021; right lower extremity wound status post trauma Electronic Signature(s) Signed: 12/01/2021 1:44:59 PM By: Geralyn Corwin DO Entered By: Geralyn Corwin on 12/01/2021 13:38:21 -------------------------------------------------------------------------------- Debridement Details Patient Name: Date of Service: Vicki [name]. 12/01/2021 12:45 PM Medical Record Number: [id number] Patient Account Number: [id number] Date of Birth/Sex: Treating RN: 07/02/1923 (88 y.o. Vicki [name], Vicki [name] Primary Care Provider: Pricilla Holm Other Clinician: Referring Provider: Treating Provider/Extender: Radene Ou in Treatment: 6 Debridement Performed for Assessment: Wound #1 Right,Lateral Lower Leg Performed By: Physician Geralyn Corwin, DO Debridement Type: Debridement Level of Consciousness (Pre-procedure): Awake and Alert Pre-procedure Verification/Time Out Yes - 13:15 Taken: Start Time: 13:16 Pain Control: Lidocaine 5% topical ointment [name] Area Debrided (L x W): otal 11 (cm) x 3.8 (cm) = 41.8 (cm) Tissue and other material debrided: Viable, Non-Viable, Slough, Subcutaneous, Slough, Hyper-granulation Level: Skin/Subcutaneous Tissue Debridement Description: Excisional Instrument: Curette Bleeding: Minimum End Time: 13:21 Procedural Pain: 0 Post Procedural Pain: 0 Response to Treatment: Procedure was tolerated well Level of Consciousness (Post- Awake and  Alert procedure): Post Debridement Measurements of Total Wound Length: (cm) 11 Width: (cm) 3.8 Depth: (cm) 0.1 Volume: (cm) 3.283 Character of Wound/Ulcer Post Debridement: Improved Post Procedure Diagnosis Same as Pre-procedure Electronic Signature(s) Signed: 12/01/2021 1:44:59 PM By: Geralyn Corwin DO Signed: 12/01/2021 4:42:08 PM By: Shawn Stall RN, BSN Entered By: Shawn Stall on 12/01/2021 13:21:54 -------------------------------------------------------------------------------- HPI Details Patient Name: Date of Service: Vicki [name]. 12/01/2021 12:45 PM Medical Record Number: [id number] Patient Account Number: [id number] Date of Birth/Sex: Treating RN: 1923-06-21 (88 y.o. F) Primary Care Provider: Pricilla Holm Other Clinician: Referring Provider: Treating Provider/Extender: Radene Ou in Treatment: 6 History of Present Illness HPI Description: Admission 10/20/2021 Vicki [name] is a 88 year old female with a past medical history of hypothyroidism and essential hypertension that presents to the clinic for a 69-month history of ulcer to the right lower extremity. On 08/15/2021 the patient experienced a mechanical fall and hit her right leg against her table. She developed a hematoma and she was evaluated in the ED for this issue. The hematoma was evacuated in the ED. She had x-rays of the tibia/fibula and ankle that showed no acute osseous abnormalities. She has been using silver alginate to the wound bed. She did have 2 rounds of doxycycline for this issue and recently completed her second course. She currently denies signs of infection. 8/28; patient presents for follow-up. She has no issues or complaints today. We have been doing Santyl and Hydrofera Blue under Kerlix/Coban. She has home health that they start coming out once weekly. She denies signs of infection. 9/5; patient presents for follow-up. We have been doing Hydrofera  Blue with antibiotic ointment under Kerlix/Coban. She has no issues or complaints today. 9/11; patient presents for follow-up. We have been doing Hydrofera Blue under Kerlix/Coban. Home health has been changing the dressing weekly. The wrap was too tight at the more proximal and cutting into the patient's skin. Other than that she has no issues or complaints today. 9/18; patient presents for follow-up. We have  been using PolyMem silver under Kerlix/Coban for the past week. She has no issues or complaints today. 9/25; patient presents for follow-up. We have been using PolyMem silver under Kerlix/Coban. She has no issues or complaints today. 10/2; patient presents for follow-up. We have been using PolyMem silver with antibiotic ointment under compression therapy. Patient has no issues or complaints today. Electronic Signature(s) Signed: 12/01/2021 1:44:59 PM By: Kalman Shan DO Entered By: Kalman Shan on 12/01/2021 13:38:44 -------------------------------------------------------------------------------- Chemical Cauterization Details Patient Name: Date of Service: Vicki [name]. 12/01/2021 12:45 PM Medical Record Number: [id number] Patient Account Number: [id number] Date of Birth/Sex: Treating RN: [birth year] (88 y.o. Vicki [name] Primary Care Provider: Elenor Quinones Other Clinician: Referring Provider: Treating Provider/Extender: Gust Rung in Treatment: 6 Procedure Performed for: Wound #1 Right,Lateral Lower Leg Performed By: Physician Kalman Shan, DO Post Procedure Diagnosis Same as Pre-procedure Notes silver nitrate used on hypergrandulation tissue. Electronic Signature(s) Signed: 12/01/2021 1:44:59 PM By: Kalman Shan DO Signed: 12/01/2021 4:42:08 PM By: Deon Pilling RN, BSN Entered By: Deon Pilling on 12/01/2021 13:22:28 -------------------------------------------------------------------------------- Physical Exam  Details Patient Name: Date of Service: Vicki [name]. 12/01/2021 12:45 PM Medical Record Number: [id number] Patient Account Number: [id number] Date of Birth/Sex: Treating RN: 1923-07-30 (88 y.o. F) Primary Care Provider: Elenor Quinones Other Clinician: Referring Provider: Treating Provider/Extender: Gust Rung in Treatment: 6 Constitutional respirations regular, non-labored and within target range for patient.. Cardiovascular 2+ dorsalis pedis/posterior tibialis pulses. Psychiatric pleasant and cooperative. Notes Right lower extremity: [name] the anterior aspect there is a large open wound with granulation tissue, Hyper granulated areas and nonviable tissue. No signs of o surrounding infection. Venous stasis dermatitis. Electronic Signature(s) Signed: 12/01/2021 1:44:59 PM By: Kalman Shan DO Entered By: Kalman Shan on 12/01/2021 13:39:23 -------------------------------------------------------------------------------- Physician Orders Details Patient Name: Date of Service: Vicki [name]. 12/01/2021 12:45 PM Medical Record Number: [id number] Patient Account Number: [id number] Date of Birth/Sex: Treating RN: November 18, 1923 (88 y.o. Vicki [name] Primary Care Provider: Elenor Quinones Other Clinician: Referring Provider: Treating Provider/Extender: Gust Rung in Treatment: 6 Verbal / Phone Orders: No Diagnosis Coding ICD-10 Coding Code Description [phone] Non-pressure chronic ulcer of other part of right lower leg with fat layer exposed T79.8XXA Other early complications of trauma, initial encounter E03.9 Hypothyroidism, unspecified I87.311 Chronic venous hypertension (idiopathic) with ulcer of right lower extremity Follow-up Appointments ppointment in 1 week. - Dr. Heber [place] and [place], Room 8 Tuesday Return A ppointment in 2 weeks. - Dr. Heber [place] and [place], Room 8 Tuesday Return A Anesthetic (In  clinic) Topical Lidocaine 5% applied to wound bed (In clinic) Topical Lidocaine 4% applied to wound bed Cellular or Tissue Based Products Wound #1 Right,Lateral Lower Leg Cellular or Tissue Based Product Type: - run insurance authorization for advance tissue products. Bathing/ Shower/ Hygiene May shower with protection but do not get wound dressing(s) wet. Edema Control - Lymphedema / SCD / Other Elevate legs to the level of the heart or above for 30 minutes daily and/or when sitting, a frequency of: - 3-4 times a day throughout the day. Avoid standing for long periods of time. Exercise regularly Wound Treatment Wound #1 - Lower Leg Wound Laterality: Right, Lateral Cleanser: Soap and Water 1 x Per Week/30 Days Discharge Instructions: May shower and wash wound with dial antibacterial soap and water prior to dressing change. Cleanser: Wound Cleanser 1 x Per Week/30 Days Discharge Instructions: Cleanse the wound with wound  cleanser prior to applying a clean dressing using gauze sponges, not tissue or cotton balls. Peri-Wound Care: Sween Lotion (Moisturizing lotion) 1 x Per Week/30 Days Discharge Instructions: Apply moisturizing lotion as directed Prim Dressing: PolyMem Silver Non-Adhesive Dressing, 4.25x4.25 in 1 x Per Week/30 Days ary Discharge Instructions: Apply to wound bed as instructed Secondary Dressing: ABD Pad, 8x10 (Home Health) 1 x Per Week/30 Days Discharge Instructions: Apply over primary dressing as directed. Secondary Dressing: Woven Gauze Sponge, Non-Sterile 4x4 in (Home Health) 1 x Per Week/30 Days Discharge Instructions: Apply over primary dressing as directed. Compression Wrap: Kerlix Roll 4.5x3.1 (in/yd) (Home Health) 1 x Per Week/30 Days Discharge Instructions: Apply Kerlix and Coban compression as directed. Compression Wrap: Coban Self-Adherent Wrap 4x5 (in/yd) (Home Health) 1 x Per Week/30 Days Discharge Instructions: Apply over Kerlix as directed. Electronic  Signature(s) Signed: 12/01/2021 1:44:59 PM By: Geralyn Corwin DO Entered By: Geralyn Corwin on 12/01/2021 13:39:29 -------------------------------------------------------------------------------- Problem List Details Patient Name: Date of Service: Vicki [name]. 12/01/2021 12:45 PM Medical Record Number: [id number] Patient Account Number: [id number] Date of Birth/Sex: Treating RN: 11-02-1923 (88 y.o. Vicki [name] Primary Care Provider: Pricilla Holm Other Clinician: Referring Provider: Treating Provider/Extender: Radene Ou in Treatment: 6 Active Problems ICD-10 Encounter Code Description Active Date MDM Diagnosis [phone] Non-pressure chronic ulcer of other part of right lower leg with fat layer 10/20/2021 No Yes exposed T79.8XXA Other early complications of trauma, initial encounter 10/20/2021 No Yes E03.9 Hypothyroidism, unspecified 10/20/2021 No Yes I87.311 Chronic venous hypertension (idiopathic) with ulcer of right lower extremity 10/20/2021 No Yes Inactive Problems Resolved Problems Electronic Signature(s) Signed: 12/01/2021 1:44:59 PM By: Geralyn Corwin DO Entered By: Geralyn Corwin on 12/01/2021 13:38:12 -------------------------------------------------------------------------------- Progress Note Details Patient Name: Date of Service: Vicki [name]. 12/01/2021 12:45 PM Medical Record Number: [id number] Patient Account Number: [id number] Date of Birth/Sex: Treating RN: 03-14-1923 (88 y.o. F) Primary Care Provider: Pricilla Holm Other Clinician: Referring Provider: Treating Provider/Extender: Radene Ou in Treatment: 6 Subjective Chief Complaint Information obtained from Patient 10/20/2021; right lower extremity wound status post trauma History of Present Illness (HPI) Admission 10/20/2021 Ms. Malaina Mortellaro is a 88 year old female with a past medical history of hypothyroidism and  essential hypertension that presents to the clinic for a 15-month history of ulcer to the right lower extremity. On 08/15/2021 the patient experienced a mechanical fall and hit her right leg against her table. She developed a hematoma and she was evaluated in the ED for this issue. The hematoma was evacuated in the ED. She had x-rays of the tibia/fibula and ankle that showed no acute osseous abnormalities. She has been using silver alginate to the wound bed. She did have 2 rounds of doxycycline for this issue and recently completed her second course. She currently denies signs of infection. 8/28; patient presents for follow-up. She has no issues or complaints today. We have been doing Santyl and Hydrofera Blue under Kerlix/Coban. She has home health that they start coming out once weekly. She denies signs of infection. 9/5; patient presents for follow-up. We have been doing Hydrofera Blue with antibiotic ointment under Kerlix/Coban. She has no issues or complaints today. 9/11; patient presents for follow-up. We have been doing Hydrofera Blue under Kerlix/Coban. Home health has been changing the dressing weekly. The wrap was too tight at the more proximal and cutting into the patient's skin. Other than that she has no issues or complaints today. 9/18; patient presents for follow-up.  We have been using PolyMem silver under Kerlix/Coban for the past week. She has no issues or complaints today. 9/25; patient presents for follow-up. We have been using PolyMem silver under Kerlix/Coban. She has no issues or complaints today. 10/2; patient presents for follow-up. We have been using PolyMem silver with antibiotic ointment under compression therapy. Patient has no issues or complaints today. Patient History Information obtained from Patient. Family History Diabetes - Child, Heart Disease - Child,Mother,Father,Siblings, Hypertension - Father,Mother, No family history of Cancer, Hereditary Spherocytosis, Kidney  Disease, Lung Disease, Seizures, Stroke, Thyroid Problems, Tuberculosis. Social History Never smoker, Marital Status - Widowed, Alcohol Use - Never, Drug Use - No History, Caffeine Use - Rarely. Medical History Eyes Denies history of Cataracts, Glaucoma, Optic Neuritis Ear/Nose/Mouth/Throat Denies history of Chronic sinus problems/congestion, Middle ear problems Hematologic/Lymphatic Denies history of Anemia, Hemophilia, Human Immunodeficiency Virus, Lymphedema, Sickle Cell Disease Respiratory Denies history of Aspiration, Asthma, Chronic Obstructive Pulmonary Disease (COPD), Pneumothorax, Sleep Apnea, Tuberculosis Cardiovascular Patient has history of Congestive Heart Failure, Hypertension Denies history of Angina, Arrhythmia, Coronary Artery Disease, Deep Vein Thrombosis, Hypotension, Myocardial Infarction, Peripheral Arterial Disease, Peripheral Venous Disease, Phlebitis, Vasculitis Gastrointestinal Denies history of Cirrhosis , Colitis, Crohnoos, Hepatitis A, Hepatitis B, Hepatitis C Endocrine Denies history of Type I Diabetes, Type II Diabetes Immunological Denies history of Lupus Erythematosus, Raynaudoos, Scleroderma Integumentary (Skin) Denies history of History of Burn Musculoskeletal Patient has history of Osteoarthritis Denies history of Gout, Rheumatoid Arthritis, Osteomyelitis Neurologic Patient has history of Neuropathy Denies history of Dementia, Quadriplegia, Paraplegia, Seizure Disorder Psychiatric Denies history of Anorexia/bulimia, Confinement Anxiety Hospitalization/Surgery History - back surgery. - right hip surgery. - hysterotomy. Medical A Surgical History Notes nd Ear/Nose/Mouth/Throat hearing aides Genitourinary Stage three Objective Constitutional respirations regular, non-labored and within target range for patient.. Vitals Time Taken: 12:52 PM, Height: 62 in, Weight: 142 lbs, BMI: 26, Temperature: 98 F, Pulse: 57 bpm, Respiratory Rate: 18  breaths/min, Blood Pressure: 148/70 mmHg. Cardiovascular 2+ dorsalis pedis/posterior tibialis pulses. Psychiatric pleasant and cooperative. General Notes: Right lower extremity: [name] the anterior aspect there is a large open wound with granulation tissue, Hyper granulated areas and nonviable tissue. o No signs of surrounding infection. Venous stasis dermatitis. Integumentary (Hair, Skin) Wound #1 status is Open. Original cause of wound was Trauma. The date acquired was: 08/04/2021. The wound has been in treatment 6 weeks. The wound is located on the Right,Lateral Lower Leg. The wound measures 11cm length x 3.8cm width x 0.1cm depth; 32.83cm^2 area and 3.283cm^3 volume. There is Fat Layer (Subcutaneous Tissue) exposed. There is no tunneling or undermining noted. There is a medium amount of serosanguineous drainage noted. The wound margin is distinct with the outline attached to the wound base. There is large (67-100%) red, pink granulation within the wound bed. There is no necrotic tissue within the wound bed. The periwound skin appearance exhibited: Hemosiderin Staining. The periwound skin appearance did not exhibit: Callus, Crepitus, Excoriation, Induration, Rash, Scarring, Dry/Scaly, Maceration, Atrophie Blanche, Cyanosis, Ecchymosis, Mottled, Pallor, Rubor, Erythema. Periwound temperature was noted as No Abnormality. Assessment Active Problems ICD-10 Non-pressure chronic ulcer of other part of right lower leg with fat layer exposed Other early complications of trauma, initial encounter Hypothyroidism, unspecified Chronic venous hypertension (idiopathic) with ulcer of right lower extremity Patient's wound has shown improvement in size and appearance since last clinic visit. I debrided nonviable tissue. No signs of infection. I used silver nitrate to the hyper granulated areas. I recommended stopping the antibiotic ointment and continuing with PolyMem silver  under Kerlix/Coban. Follow-up in 1  week. Procedures Wound #1 Pre-procedure diagnosis of Wound #1 is an Abrasion located on the Right,Lateral Lower Leg . There was a Excisional Skin/Subcutaneous Tissue Debridement with a total area of 41.8 sq cm performed by Geralyn Corwin, DO. With the following instrument(s): Curette to remove Viable and Non-Viable tissue/material. Material removed includes Subcutaneous Tissue, Slough, and Hyper-granulation after achieving pain control using Lidocaine 5% topical ointment. A time out was conducted at 13:15, prior to the start of the procedure. A Minimum amount of bleeding was controlled with N/A. The procedure was tolerated well with a pain level of 0 throughout and a pain level of 0 following the procedure. Post Debridement Measurements: 11cm length x 3.8cm width x 0.1cm depth; 3.283cm^3 volume. Character of Wound/Ulcer Post Debridement is improved. Post procedure Diagnosis Wound #1: Same as Pre-Procedure Pre-procedure diagnosis of Wound #1 is an Abrasion located on the Right,Lateral Lower Leg . An Chemical Cauterization procedure was performed by Geralyn Corwin, DO. Post procedure Diagnosis Wound #1: Same as Pre-Procedure Notes: silver nitrate used on hypergrandulation tissue. Plan Follow-up Appointments: Return Appointment in 1 week. - Dr. Mikey Bussing and [place], Room 8 Tuesday Return Appointment in 2 weeks. - Dr. Mikey Bussing and [place], Room 8 Tuesday Anesthetic: (In clinic) Topical Lidocaine 5% applied to wound bed (In clinic) Topical Lidocaine 4% applied to wound bed Cellular or Tissue Based Products: Wound #1 Right,Lateral Lower Leg: Cellular or Tissue Based Product Type: - run insurance authorization for advance tissue products. Bathing/ Shower/ Hygiene: May shower with protection but do not get wound dressing(s) wet. Edema Control - Lymphedema / SCD / Other: Elevate legs to the level of the heart or above for 30 minutes daily and/or when sitting, a frequency of: - 3-4 times a day  throughout the day. Avoid standing for long periods of time. Exercise regularly WOUND #1: - Lower Leg Wound Laterality: Right, Lateral Cleanser: Soap and Water 1 x Per Week/30 Days Discharge Instructions: May shower and wash wound with dial antibacterial soap and water prior to dressing change. Cleanser: Wound Cleanser 1 x Per Week/30 Days Discharge Instructions: Cleanse the wound with wound cleanser prior to applying a clean dressing using gauze sponges, not tissue or cotton balls. Peri-Wound Care: Sween Lotion (Moisturizing lotion) 1 x Per Week/30 Days Discharge Instructions: Apply moisturizing lotion as directed Prim Dressing: PolyMem Silver Non-Adhesive Dressing, 4.25x4.25 in 1 x Per Week/30 Days ary Discharge Instructions: Apply to wound bed as instructed Secondary Dressing: ABD Pad, 8x10 (Home Health) 1 x Per Week/30 Days Discharge Instructions: Apply over primary dressing as directed. Secondary Dressing: Woven Gauze Sponge, Non-Sterile 4x4 in (Home Health) 1 x Per Week/30 Days Discharge Instructions: Apply over primary dressing as directed. Com pression Wrap: Kerlix Roll 4.5x3.1 (in/yd) (Home Health) 1 x Per Week/30 Days Discharge Instructions: Apply Kerlix and Coban compression as directed. Com pression Wrap: Coban Self-Adherent Wrap 4x5 (in/yd) (Home Health) 1 x Per Week/30 Days Discharge Instructions: Apply over Kerlix as directed. 1. In office sharp debridement 2. Silver nitrate 3. PolyMem silver under Kerlix/Coban 4. Follow-up in 1 week Electronic Signature(s) Signed: 12/01/2021 1:44:59 PM By: Geralyn Corwin DO Entered By: Geralyn Corwin on 12/01/2021 13:40:22 -------------------------------------------------------------------------------- HxROS Details Patient Name: Date of Service: Vicki [name]. 12/01/2021 12:45 PM Medical Record Number: [id number] Patient Account Number: [id number] Date of Birth/Sex: Treating RN: May 27, 1923 (88 y.o. F) Primary Care Provider:  Pricilla Holm Other Clinician: Referring Provider: Treating Provider/Extender: Radene Ou in Treatment: 6 Information  Obtained From Patient Eyes Medical History: Negative for: Cataracts; Glaucoma; Optic Neuritis Ear/Nose/Mouth/Throat Medical History: Negative for: Chronic sinus problems/congestion; Middle ear problems Past Medical History Notes: hearing aides Hematologic/Lymphatic Medical History: Negative for: Anemia; Hemophilia; Human Immunodeficiency Virus; Lymphedema; Sickle Cell Disease Respiratory Medical History: Negative for: Aspiration; Asthma; Chronic Obstructive Pulmonary Disease (COPD); Pneumothorax; Sleep Apnea; Tuberculosis Cardiovascular Medical History: Positive for: Congestive Heart Failure; Hypertension Negative for: Angina; Arrhythmia; Coronary Artery Disease; Deep Vein Thrombosis; Hypotension; Myocardial Infarction; Peripheral Arterial Disease; Peripheral Venous Disease; Phlebitis; Vasculitis Gastrointestinal Medical History: Negative for: Cirrhosis ; Colitis; Crohns; Hepatitis A; Hepatitis B; Hepatitis C Endocrine Medical History: Negative for: Type I Diabetes; Type II Diabetes Genitourinary Medical History: Past Medical History Notes: Stage three Immunological Medical History: Negative for: Lupus Erythematosus; Raynauds; Scleroderma Integumentary (Skin) Medical History: Negative for: History of Burn Musculoskeletal Medical History: Positive for: Osteoarthritis Negative for: Gout; Rheumatoid Arthritis; Osteomyelitis Neurologic Medical History: Positive for: Neuropathy Negative for: Dementia; Quadriplegia; Paraplegia; Seizure Disorder Psychiatric Medical History: Negative for: Anorexia/bulimia; Confinement Anxiety Immunizations Pneumococcal Vaccine: Received Pneumococcal Vaccination: Yes Received Pneumococcal Vaccination On or After 60th Birthday: Yes Implantable Devices Yes Hospitalization / Surgery  History Type of Hospitalization/Surgery back surgery right hip surgery hysterotomy Family and Social History Cancer: No; Diabetes: Yes - Child; Heart Disease: Yes - Child,Mother,Father,Siblings; Hereditary Spherocytosis: No; Hypertension: Yes - Father,Mother; Kidney Disease: No; Lung Disease: No; Seizures: No; Stroke: No; Thyroid Problems: No; Tuberculosis: No; Never smoker; Marital Status - Widowed; Alcohol Use: Never; Drug Use: No History; Caffeine Use: Rarely; Financial Concerns: No; Food, Clothing or Shelter Needs: No; Support System Lacking: No; Transportation Concerns: No Electronic Signature(s) Signed: 12/01/2021 1:44:59 PM By: Geralyn Corwin DO Entered By: Geralyn Corwin on 12/01/2021 13:38:50 -------------------------------------------------------------------------------- SuperBill Details Patient Name: Date of Service: Vicki [name]. 12/01/2021 Medical Record Number: [id number] Patient Account Number: [id number] Date of Birth/Sex: Treating RN: [birth year] (88 y.o. 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 Modifier: osed Quantity: 1 CPT4 Code: [id number] Description: 11045 - DEB SUBQ TISS EA ADDL 20CM ICD-10 Diagnosis Description L97.812 Non-pressure chronic ulcer of other part of right lower leg with fat layer  exp Modifier: osed Quantity: 2 Physician Procedures : CPT4 Code Description Modifier [id number] 11042 - WC PHYS SUBQ TISS 20 SQ CM ICD-10 Diagnosis Description L97.812 Non-pressure chronic ulcer of other part of right lower leg with fat layer exposed Quantity: 1 : [id number] 11045 - WC PHYS SUBQ TISS EA ADDL 20 CM ICD-10 Diagnosis Description L97.812 Non-pressure chronic ulcer of other part of right lower leg with fat layer exposed Quantity: 2 Electronic Signature(s) Signed: 12/01/2021 1:44:59 PM By: Geralyn Corwin DO Entered By: Geralyn Corwin on 12/01/2021 13:40:31

## 2021-12-08 ENCOUNTER — Encounter (HOSPITAL_BASED_OUTPATIENT_CLINIC_OR_DEPARTMENT_OTHER): Payer: Medicare Other | Admitting: Internal Medicine

## 2021-12-08 DIAGNOSIS — L97812 Non-pressure chronic ulcer of other part of right lower leg with fat layer exposed: Secondary | ICD-10-CM

## 2021-12-08 DIAGNOSIS — I87311 Chronic venous hypertension (idiopathic) with ulcer of right lower extremity: Secondary | ICD-10-CM | POA: Diagnosis not present

## 2021-12-08 NOTE — Progress Notes (Signed)
VERANIA, SALBERG ([id number]) [id number] Page 1 of 9 Visit Report for 12/08/2021 Chief Complaint Document Details Patient Name: Date of Service: Vicki [name] 12/08/2021 12:45 PM Medical Record Number: [id number] Patient Account Number: [id number] Date of Birth/Sex: Treating RN: 12-21-1923 (88 y.o. Vicki [name] Primary Care Provider: Pricilla Holm Other Clinician: Referring Provider: Treating Provider/Extender: Radene Ou in Treatment: 7 Information Obtained from: Patient Chief Complaint 10/20/2021; right lower extremity wound status post trauma Electronic Signature(s) Signed: 12/08/2021 1:58:40 PM By: Geralyn Corwin DO Entered By: Geralyn Corwin on 12/08/2021 13:50:54 -------------------------------------------------------------------------------- Debridement Details Patient Name: Date of Service: Vicki [name] 12/08/2021 12:45 PM Medical Record Number: [id number] Patient Account Number: [id number] Date of Birth/Sex: Treating RN: 08/27/1923 (88 y.o. Vicki [name] Primary Care Provider: Pricilla Holm Other Clinician: Referring Provider: Treating Provider/Extender: Radene Ou in Treatment: 7 Debridement Performed for Assessment: Wound #1 Right,Lateral Lower Leg Performed By: Physician Geralyn Corwin, DO Debridement Type: Debridement Level of Consciousness (Pre-procedure): Awake and Alert Pre-procedure Verification/Time Out Yes - 13:30 Taken: Start Time: 13:31 Pain Control: Lidocaine 5% topical ointment T Area Debrided (L x W): otal 10.9 (cm) x 3.7 (cm) = 40.33 (cm) Tissue and other material debrided: Viable, Non-Viable, Slough, Subcutaneous, Slough Level: Skin/Subcutaneous Tissue Debridement Description: Excisional Instrument: Curette Bleeding: Minimum Hemostasis Achieved: Pressure End Time: 13:35 Procedural Pain: 0 Post Procedural Pain: 0 Response to  Treatment: Procedure was tolerated well Level of Consciousness (Post- Awake and Alert procedure): Post Debridement Measurements of Total Wound Length: (cm) 10.9 Width: (cm) 3.7 Depth: (cm) 0.1 Volume: (cm) 3.168 Character of Wound/Ulcer Post Debridement: Improved Post Procedure Diagnosis KAYA, POTTENGER T ([id number]) [id number] Page 2 of 9 Same as Pre-procedure Electronic Signature(s) Signed: 12/08/2021 1:58:40 PM By: Geralyn Corwin DO Signed: 12/08/2021 4:57:59 PM By: Shawn Stall RN, BSN Entered By: Shawn Stall on 12/08/2021 13:35:32 -------------------------------------------------------------------------------- HPI Details Patient Name: Date of Service: Vicki [name] 12/08/2021 12:45 PM Medical Record Number: [id number] Patient Account Number: [id number] Date of Birth/Sex: Treating RN: 1923/04/05 (88 y.o. [name] Primary Care Provider: Pricilla Holm Other Clinician: Referring Provider: Treating Provider/Extender: Radene Ou in Treatment: 7 History of Present Illness HPI Description: Admission 10/20/2021 [name] is a 88 year old female with a past medical history of hypothyroidism and essential hypertension that presents to the clinic for a 32-month history of ulcer to the right lower extremity. On 08/15/2021 the patient experienced a mechanical fall and hit her right leg against her table. She developed a hematoma and she was evaluated in the ED for this issue. The hematoma was evacuated in the ED. She had x-rays of the tibia/fibula and ankle that showed no acute osseous abnormalities. She has been using silver alginate to the wound bed. She did have 2 rounds of doxycycline for this issue and recently completed her second course. She currently denies signs of infection. 8/28; patient presents for follow-up. She has no issues or complaints today. We have been doing Santyl and Hydrofera Blue  under Kerlix/Coban. She has home health that they start coming out once weekly. She denies signs of infection. 9/5; patient presents for follow-up. We have been doing Hydrofera Blue with antibiotic ointment under Kerlix/Coban. She has no issues or complaints today. 9/11; patient presents for follow-up. We have been doing Hydrofera Blue under Kerlix/Coban. Home health has been changing the dressing weekly. The wrap was too tight at the more proximal and cutting into the  patient's skin. Other than that she has no issues or complaints today. 9/18; patient presents for follow-up. We have been using PolyMem silver under Kerlix/Coban for the past week. She has no issues or complaints today. 9/25; patient presents for follow-up. We have been using PolyMem silver under Kerlix/Coban. She has no issues or complaints today. 10/2; patient presents for follow-up. We have been using PolyMem silver with antibiotic ointment under compression therapy. Patient has no issues or complaints today. 10/9; patient presents for follow-up. We have been using PolyMem silver under compression therapy. Patient has no issues or complaints today. She reports improvement in wound healing. Electronic Signature(s) Signed: 12/08/2021 1:58:40 PM By: Geralyn Corwin DO Entered By: Geralyn Corwin on 12/08/2021 13:53:14 -------------------------------------------------------------------------------- Physical Exam Details Patient Name: Date of Service: Vicki [name] 12/08/2021 12:45 PM Medical Record Number: [id number] Patient Account Number: [id number] Date of Birth/Sex: Treating RN: 05/13/1923 (88 y.o. [name] Primary Care Provider: Pricilla Holm Other Clinician: Referring Provider: Treating Provider/Extender: Radene Ou in Treatment: 7 Constitutional respirations regular, non-labored and within target range for patient.. Cardiovascular CHONTEL, WARNING ([id number])  [id number] Page 3 of 9 2+ dorsalis pedis/posterior tibialis pulses. Psychiatric pleasant and cooperative. Notes Right lower extremity: T the anterior aspect there is a large open wound with granulation tissue and nonviable tissue. No signs of surrounding infection. Venous o stasis dermatitis. Electronic Signature(s) Signed: 12/08/2021 1:58:40 PM By: Geralyn Corwin DO Entered By: Geralyn Corwin on 12/08/2021 13:54:44 -------------------------------------------------------------------------------- Physician Orders Details Patient Name: Date of Service: Vicki [name] 12/08/2021 12:45 PM Medical Record Number: [id number] Patient Account Number: [id number] Date of Birth/Sex: Treating RN: 03/08/1923 (88 y.o. [name] Primary Care Provider: Pricilla Holm Other Clinician: Referring Provider: Treating Provider/Extender: Radene Ou in Treatment: 7 Verbal / Phone Orders: No Diagnosis Coding ICD-10 Coding Code Description [phone] Non-pressure chronic ulcer of other part of right lower leg with fat layer exposed T79.8XXA Other early complications of trauma, initial encounter E03.9 Hypothyroidism, unspecified I87.311 Chronic venous hypertension (idiopathic) with ulcer of right lower extremity Follow-up Appointments ppointment in 1 week. - Dr. Mikey Bussing and [place], Room 8 Tuesday Return A ppointment in 2 weeks. - Dr. Mikey Bussing and [place], Room 8 Tuesday Return A Anesthetic (In clinic) Topical Lidocaine 5% applied to wound bed Cellular or Tissue Based Products Wound #1 Right,Lateral Lower Leg Cellular or Tissue Based Product Type: - run insurance authorization for advance tissue products. Bathing/ Shower/ Hygiene May shower with protection but do not get wound dressing(s) wet. Edema Control - Lymphedema / SCD / Other Elevate legs to the level of the heart or above for 30 minutes daily and/or when sitting, a frequency  of: - 3-4 times a day throughout the day. Avoid standing for long periods of time. Exercise regularly Wound Treatment Wound #1 - Lower Leg Wound Laterality: Right, Lateral Cleanser: Soap and Water 1 x Per Week/30 Days Discharge Instructions: May shower and wash wound with dial antibacterial soap and water prior to dressing change. Cleanser: Wound Cleanser 1 x Per Week/30 Days Discharge Instructions: Cleanse the wound with wound cleanser prior to applying a clean dressing using gauze sponges, not tissue or cotton balls. Peri-Wound Care: Zinc Oxide Ointment 30g tube 1 x Per Week/30 Days Discharge Instructions: Apply Zinc Oxide to periwound with each dressing change Peri-Wound Care: Sween Lotion (Moisturizing lotion) 1 x Per Week/30 Days Discharge Instructions: Apply moisturizing lotion as directed Topical: Gentamicin 1 x Per Week/30 Days Discharge  Instructions: add directly to wound bed. KOBE, JANSMA ([id number]) [id number] Page 4 of 9 Prim Dressing: PolyMem Silver Non-Adhesive Dressing, 4.25x4.25 in 1 x Per Week/30 Days ary Discharge Instructions: Apply to wound bed as instructed Secondary Dressing: Zetuvit Plus 4x8 in 1 x Per Week/30 Days Discharge Instructions: Apply over primary dressing as directed. Compression Wrap: Kerlix Roll 4.5x3.1 (in/yd) (Home Health) 1 x Per Week/30 Days Discharge Instructions: Apply Kerlix and Coban compression as directed. Compression Wrap: Coban Self-Adherent Wrap 4x5 (in/yd) (Home Health) 1 x Per Week/30 Days Discharge Instructions: Apply over Kerlix as directed. Electronic Signature(s) Signed: 12/08/2021 1:58:40 PM By: Geralyn Corwin DO Entered By: Geralyn Corwin on 12/08/2021 13:54:57 -------------------------------------------------------------------------------- Problem List Details Patient Name: Date of Service: Vicki [name] 12/08/2021 12:45 PM Medical Record Number: [id number] Patient Account Number:  [id number] Date of Birth/Sex: Treating RN: 08-31-1923 (88 y.o. [name] Primary Care Provider: Pricilla Holm Other Clinician: Referring Provider: Treating Provider/Extender: Radene Ou in Treatment: 7 Active Problems ICD-10 Encounter Code Description Active Date MDM Diagnosis [phone] Non-pressure chronic ulcer of other part of right lower leg with fat layer 10/20/2021 No Yes exposed T79.8XXA Other early complications of trauma, initial encounter 10/20/2021 No Yes E03.9 Hypothyroidism, unspecified 10/20/2021 No Yes I87.311 Chronic venous hypertension (idiopathic) with ulcer of right lower extremity 10/20/2021 No Yes Inactive Problems Resolved Problems Electronic Signature(s) Signed: 12/08/2021 1:58:40 PM By: Geralyn Corwin DO Entered By: Geralyn Corwin on 12/08/2021 13:49:30 Juanell Fairly T ([id number]) [id number] Page 5 of 9 -------------------------------------------------------------------------------- Progress Note Details Patient Name: Date of Service: ENZLEY, KITCHENS 12/08/2021 12:45 PM Medical Record Number: [id number] Patient Account Number: [id number] Date of Birth/Sex: Treating RN: [birth year] (88 y.o. [name] Primary Care Provider: Pricilla Holm Other Clinician: Referring Provider: Treating Provider/Extender: Radene Ou in Treatment: 7 Subjective Chief Complaint Information obtained from Patient 10/20/2021; right lower extremity wound status post trauma History of Present Illness (HPI) Admission 10/20/2021 Ms. Anise Harbin is a 88 year old female with a past medical history of hypothyroidism and essential hypertension that presents to the clinic for a 29-month history of ulcer to the right lower extremity. On 08/15/2021 the patient experienced a mechanical fall and hit her right leg against her table. She developed a hematoma and she was evaluated in the  ED for this issue. The hematoma was evacuated in the ED. She had x-rays of the tibia/fibula and ankle that showed no acute osseous abnormalities. She has been using silver alginate to the wound bed. She did have 2 rounds of doxycycline for this issue and recently completed her second course. She currently denies signs of infection. 8/28; patient presents for follow-up. She has no issues or complaints today. We have been doing Santyl and Hydrofera Blue under Kerlix/Coban. She has home health that they start coming out once weekly. She denies signs of infection. 9/5; patient presents for follow-up. We have been doing Hydrofera Blue with antibiotic ointment under Kerlix/Coban. She has no issues or complaints today. 9/11; patient presents for follow-up. We have been doing Hydrofera Blue under Kerlix/Coban. Home health has been changing the dressing weekly. The wrap was too tight at the more proximal and cutting into the patient's skin. Other than that she has no issues or complaints today. 9/18; patient presents for follow-up. We have been using PolyMem silver under Kerlix/Coban for the past week. She has no issues or complaints today. 9/25; patient presents for follow-up. We have been using PolyMem silver under  Kerlix/Coban. She has no issues or complaints today. 10/2; patient presents for follow-up. We have been using PolyMem silver with antibiotic ointment under compression therapy. Patient has no issues or complaints today. 10/9; patient presents for follow-up. We have been using PolyMem silver under compression therapy. Patient has no issues or complaints today. She reports improvement in wound healing. Patient History Information obtained from Patient. Family History Diabetes - Child, Heart Disease - Child,Mother,Father,Siblings, Hypertension - Father,Mother, No family history of Cancer, Hereditary Spherocytosis, Kidney Disease, Lung Disease, Seizures, Stroke, Thyroid Problems,  Tuberculosis. Social History Never smoker, Marital Status - Widowed, Alcohol Use - Never, Drug Use - No History, Caffeine Use - Rarely. Medical History Eyes Denies history of Cataracts, Glaucoma, Optic Neuritis Ear/Nose/Mouth/Throat Denies history of Chronic sinus problems/congestion, Middle ear problems Hematologic/Lymphatic Denies history of Anemia, Hemophilia, Human Immunodeficiency Virus, Lymphedema, Sickle Cell Disease Respiratory Denies history of Aspiration, Asthma, Chronic Obstructive Pulmonary Disease (COPD), Pneumothorax, Sleep Apnea, Tuberculosis Cardiovascular Patient has history of Congestive Heart Failure, Hypertension Denies history of Angina, Arrhythmia, Coronary Artery Disease, Deep Vein Thrombosis, Hypotension, Myocardial Infarction, Peripheral Arterial Disease, Peripheral Venous Disease, Phlebitis, Vasculitis Gastrointestinal Denies history of Cirrhosis , Colitis, Crohnoos, Hepatitis A, Hepatitis B, Hepatitis C Endocrine Denies history of Type I Diabetes, Type II Diabetes Immunological Denies history of Lupus Erythematosus, Raynaudoos, Scleroderma Integumentary (Skin) Denies history of History of Burn Musculoskeletal Patient has history of Osteoarthritis Denies history of Gout, Rheumatoid Arthritis, Osteomyelitis Neurologic Patient has history of Neuropathy Denies history of Dementia, Quadriplegia, Paraplegia, Seizure Disorder Psychiatric Denies history of Baruch Gouty [address] VERTA, RIEDLINGER T ([id number]) [id number] Page 6 of 9 Hospitalization/Surgery History - back surgery. - right hip surgery. - hysterotomy. Medical A Surgical History Notes nd Ear/Nose/Mouth/Throat hearing aides Genitourinary Stage three Objective Constitutional respirations regular, non-labored and within target range for patient.. Vitals Time Taken: 1:08 PM, Height: 62 in, Weight: 142 lbs, BMI: 26, Temperature: 97.7 F, Pulse: 61 bpm,  Respiratory Rate: 18 breaths/min, Blood Pressure: 148/80 mmHg. Cardiovascular 2+ dorsalis pedis/posterior tibialis pulses. Psychiatric pleasant and cooperative. General Notes: Right lower extremity: T the anterior aspect there is a large open wound with granulation tissue and nonviable tissue. No signs of surrounding o infection. Venous stasis dermatitis. Integumentary (Hair, Skin) Wound #1 status is Open. Original cause of wound was Trauma. The date acquired was: 08/04/2021. The wound has been in treatment 7 weeks. The wound is located on the Right,Lateral Lower Leg. The wound measures 10.9cm length x 3.7cm width x 0.1cm depth; 31.675cm^2 area and 3.168cm^3 volume. There is Fat Layer (Subcutaneous Tissue) exposed. There is no tunneling or undermining noted. There is a medium amount of serosanguineous drainage noted. The wound margin is distinct with the outline attached to the wound base. There is large (67-100%) red, pink, hyper - granulation within the wound bed. There is a small (1-33%) amount of necrotic tissue within the wound bed including Adherent Slough. The periwound skin appearance exhibited: Hemosiderin Staining. The periwound skin appearance did not exhibit: Callus, Crepitus, Excoriation, Induration, Rash, Scarring, Dry/Scaly, Maceration, Atrophie Blanche, Cyanosis, Ecchymosis, Mottled, Pallor, Rubor, Erythema. Periwound temperature was noted as No Abnormality. Assessment Active Problems ICD-10 Non-pressure chronic ulcer of other part of right lower leg with fat layer exposed Other early complications of trauma, initial encounter Hypothyroidism, unspecified Chronic venous hypertension (idiopathic) with ulcer of right lower extremity Patient's wound has shown improvement in size and appearance since last clinic visit. I debrided nonviable tissue. I recommended continuing PolyMem silver under compression therapy. I will add back the  antibiotic ointment to address any bioburden. No  surrounding signs of infection. Follow-up in 1 week. Procedures Wound #1 Pre-procedure diagnosis of Wound #1 is an Abrasion located on the Right,Lateral Lower Leg . There was a Excisional Skin/Subcutaneous Tissue Debridement with a total area of 40.33 sq cm performed by Geralyn CorwinHoffman, Kensli Bowley, DO. With the following instrument(s): Curette to remove Viable and Non-Viable tissue/material. Material removed includes Subcutaneous Tissue and Slough and after achieving pain control using Lidocaine 5% topical ointment. A time out was conducted at 13:30, prior to the start of the procedure. A Minimum amount of bleeding was controlled with Pressure. The procedure was tolerated well with a pain level of 0 throughout and a pain level of 0 following the procedure. Post Debridement Measurements: 10.9cm length x 3.7cm width x 0.1cm depth; 3.168cm^3 volume. Character of Wound/Ulcer Post Debridement is improved. Post procedure Diagnosis Wound #1: Same as Pre-Procedure Plan Ander PurpuraLDERMAN, Rhetta T ([id number]) [id number] Page 7 of 9 Follow-up Appointments: Return Appointment in 1 week. - Dr. Mikey BussingHoffman and [place], Room 8 Tuesday Return Appointment in 2 weeks. - Dr. Mikey [place]offman [place], [place] 8 Tuesday Anesthetic: (In clinic) Topical Lidocaine 5% applied to wound bed Cellular or Tissue Based Products: Wound #1 Right,Lateral Lower Leg: Cellular or Tissue Based Product Type: - run insurance authorization for advance tissue products. Bathing/ Shower/ Hygiene: May shower with protection but do not get wound dressing(s) wet. Edema Control - Lymphedema / SCD / Other: Elevate legs to the level of the heart or above for 30 minutes daily and/or when sitting, a frequency of: - 3-4 times a day throughout the day. Avoid standing for long periods of time. Exercise regularly WOUND #1: - Lower Leg Wound Laterality: Right, Lateral Cleanser: Soap and Water 1 x Per Week/30 Days Discharge Instructions: May shower and  wash wound with dial antibacterial soap and water prior to dressing change. Cleanser: Wound Cleanser 1 x Per Week/30 Days Discharge Instructions: Cleanse the wound with wound cleanser prior to applying a clean dressing using gauze sponges, not tissue or cotton balls. Peri-Wound Care: Zinc Oxide Ointment 30g tube 1 x Per Week/30 Days Discharge Instructions: Apply Zinc Oxide to periwound with each dressing change Peri-Wound Care: Sween Lotion (Moisturizing lotion) 1 x Per Week/30 Days Discharge Instructions: Apply moisturizing lotion as directed Topical: Gentamicin 1 x Per Week/30 Days Discharge Instructions: add directly to wound bed. Prim Dressing: PolyMem Silver Non-Adhesive Dressing, 4.25x4.25 in 1 x Per Week/30 Days ary Discharge Instructions: Apply to wound bed as instructed Secondary Dressing: Zetuvit Plus 4x8 in 1 x Per Week/30 Days Discharge Instructions: Apply over primary dressing as directed. Com pression Wrap: Kerlix Roll 4.5x3.1 (in/yd) (Home Health) 1 x Per Week/30 Days Discharge Instructions: Apply Kerlix and Coban compression as directed. Com pression Wrap: Coban Self-Adherent Wrap 4x5 (in/yd) (Home Health) 1 x Per Week/30 Days Discharge Instructions: Apply over Kerlix as directed. 1. In office sharp debridement 2. PolyMem silver under Kerlix/Coban 3. Follow-up in 1 week Electronic Signature(s) Signed: 12/08/2021 1:58:40 PM By: Geralyn CorwinHoffman, Cortasia Screws DO Entered By: Geralyn CorwinHoffman, Oriya Kettering on 12/08/2021 13:55:50 -------------------------------------------------------------------------------- HxROS Details Patient Name: Date of Service: Vicki PresumeA LDERMA N, Shaneice T. 12/08/2021 12:45 PM Medical Record Number: [id number] Patient Account Number: [id number] Date of Birth/Sex: Treating RN: 11-11-1923 (88 y.o. Arta SilenceF) Deaton, Bobbi Primary Care Provider: Pricilla Holmyter-Brown, Sherry Other Clinician: Referring Provider: Treating Provider/Extender: Radene OuHoffman, Harlym Gehling Pang, Richard Y Weeks in Treatment: 7 Information  Obtained From Patient Eyes Medical History: Negative for: Cataracts; Glaucoma; Optic Neuritis Ear/Nose/Mouth/Throat Medical History: Negative for: Chronic sinus  problems/congestion; Middle ear problems Past Medical History Notes: hearing aides Hematologic/Lymphatic Medical History: Negative for: Anemia; Hemophilia; Human Immunodeficiency Virus; Lymphedema; Sickle Cell Disease TARYN, SHELLHAMMER ([id number]) [id number] Page 8 of 9 Respiratory Medical History: Negative for: Aspiration; Asthma; Chronic Obstructive Pulmonary Disease (COPD); Pneumothorax; Sleep Apnea; Tuberculosis Cardiovascular Medical History: Positive for: Congestive Heart Failure; Hypertension Negative for: Angina; Arrhythmia; Coronary Artery Disease; Deep Vein Thrombosis; Hypotension; Myocardial Infarction; Peripheral Arterial Disease; Peripheral Venous Disease; Phlebitis; Vasculitis Gastrointestinal Medical History: Negative for: Cirrhosis ; Colitis; Crohns; Hepatitis A; Hepatitis B; Hepatitis C Endocrine Medical History: Negative for: Type I Diabetes; Type II Diabetes Genitourinary Medical History: Past Medical History Notes: Stage three Immunological Medical History: Negative for: Lupus Erythematosus; Raynauds; Scleroderma Integumentary (Skin) Medical History: Negative for: History of Burn Musculoskeletal Medical History: Positive for: Osteoarthritis Negative for: Gout; Rheumatoid Arthritis; Osteomyelitis Neurologic Medical History: Positive for: Neuropathy Negative for: Dementia; Quadriplegia; Paraplegia; Seizure Disorder Psychiatric Medical History: Negative for: Anorexia/bulimia; Confinement Anxiety Immunizations Pneumococcal Vaccine: Received Pneumococcal Vaccination: Yes Received Pneumococcal Vaccination On or After 60th Birthday: Yes Implantable Devices Yes Hospitalization / Surgery History Type of Hospitalization/Surgery back surgery right hip  surgery hysterotomy Family and Social History Cancer: No; Diabetes: Yes - Child; Heart Disease: Yes - Child,Mother,Father,Siblings; Hereditary Spherocytosis: No; Hypertension: Yes - Father,Mother; Kidney Disease: No; Lung Disease: No; Seizures: No; Stroke: No; Thyroid Problems: No; Tuberculosis: No; Never smoker; Marital Status - Widowed; Alcohol Use: Never; Drug Use: No History; Caffeine Use: Rarely; Financial Concerns: No; Food, Clothing or Shelter Needs: No; Support System Lacking: No; Transportation Concerns: No Electronic Signature(s) LUA, FENG T ([id number]) [id number] Page 9 of 9 Signed: 12/08/2021 1:58:40 PM By: Kalman Shan DO Signed: 12/08/2021 4:57:59 PM By: Deon Pilling RN, BSN Entered By: Kalman Shan on 12/08/2021 13:53:23 -------------------------------------------------------------------------------- SuperBill Details Patient Name: Date of Service: Seleta Rhymes T. 12/08/2021 Medical Record Number: [id number] Patient Account Number: [id number] Date of Birth/Sex: Treating RN: 1923/08/22 (88 y.o. 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 Modifier: osed Quantity: 1 : CPT4 Code: [id number] Description: 11045 - DEB SUBQ TISS EA ADDL 20CM ICD-10 Diagnosis Description B15.176  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

## 2021-12-09 NOTE — Progress Notes (Signed)
REMELL, [name] ([id number]) [id number] Page 1 of 8 Visit Report for 12/08/2021 Arrival Information Details Patient Name: Date of Service: Vicki [name], Vicki [name] 12/08/2021 12:45 PM Medical Record Number: [id number] Patient Account Number: [id number] Date of Birth/Sex: Treating RN: 1923-09-24 (88 y.o. [name], [name] Primary Care Bari Leib: Elenor Quinones Other Clinician: Referring Lulabelle Desta: Treating Hy Swiatek/Extender: Gust Rung in Treatment: 7 Visit Information History Since Last Visit Added or deleted any medications: No Patient Arrived: Wheel Chair Any new allergies or adverse reactions: No Arrival Time: 13:07 Had a fall or experienced change in No Accompanied By: daughter in law activities of daily living that may affect Transfer Assistance: None risk of falls: Patient Identification Verified: Yes Signs or symptoms of abuse/neglect since last visito No Secondary Verification Process Completed: Yes Hospitalized since last visit: No Patient Requires Transmission-Based Precautions: No Implantable device outside of the clinic excluding No Patient Has Alerts: No cellular tissue based products placed in the center since last visit: Has Compression in Place as Prescribed: Yes Pain Present Now: Yes Electronic Signature(s) Signed: 12/09/2021 8:52:44 AM By: Erenest Blank Entered By: Erenest Blank on 12/08/2021 13:08:06 -------------------------------------------------------------------------------- Encounter Discharge Information Details Patient Name: Date of Service: Vicki Rhymes T. 12/08/2021 12:45 PM Medical Record Number: [id number] Patient Account Number: [id number] Date of Birth/Sex: Treating RN: October 08, 1923 (88 y.o. [name] Primary Care Deshia Vanderhoof: Elenor Quinones Other Clinician: Referring Kieran Arreguin: Treating Cabrini Ruggieri/Extender: Gust Rung in Treatment: 7 Encounter  Discharge Information Items Post Procedure Vitals Discharge Condition: Stable Temperature (F): 97.7 Ambulatory Status: Walker Pulse (bpm): 61 Discharge Destination: Home Respiratory Rate (breaths/min): 20 Transportation: Private Auto Blood Pressure (mmHg): 148/80 Accompanied By: daughter Schedule Follow-up Appointment: Yes Clinical Summary of Care: Electronic Signature(s) Signed: 12/08/2021 4:57:59 PM By: Deon Pilling RN, BSN Entered By: Deon Pilling on 12/08/2021 13:37:47 Vicki [name] ([id number]) [id number] Page 2 of 8 -------------------------------------------------------------------------------- Lower Extremity Assessment Details Patient Name: Date of Service: Vicki [name] 12/08/2021 12:45 PM Medical Record Number: [id number] Patient Account Number: [id number] Date of Birth/Sex: Treating RN: 1923/06/23 (88 y.o. [name], [name] Primary Care Karon Heckendorn: Elenor Quinones Other Clinician: Referring Michaelina Blandino: Treating Anyjah Roundtree/Extender: Gust Rung in Treatment: 7 Edema Assessment Assessed: [Left: No] [Right: Yes] Edema: [Left: N] [Right: o] Calf Left: Right: Point of Measurement: 28 cm From Medial Instep 34 cm Ankle Left: Right: Point of Measurement: 12 cm From Medial Instep 21.5 cm Vascular Assessment Pulses: Dorsalis Pedis Palpable: [Right:Yes] Electronic Signature(s) Signed: 12/08/2021 4:57:59 PM By: Deon Pilling RN, BSN Signed: 12/09/2021 8:52:44 AM By: Erenest Blank Entered By: Erenest Blank on 12/08/2021 13:15:50 -------------------------------------------------------------------------------- Multi Wound Chart Details Patient Name: Date of Service: Vicki Rhymes T. 12/08/2021 12:45 PM Medical Record Number: [id number] Patient Account Number: [id number] Date of Birth/Sex: Treating RN: 1923/07/21 (88 y.o. [name] Primary Care Kaylinn Dedic: Elenor Quinones Other Clinician: Referring  Joeanthony Seeling: Treating Sayeed Weatherall/Extender: Gust Rung in Treatment: 7 Vital Signs Height(in): 62 Pulse(bpm): 47 Weight(lbs): 142 Blood Pressure(mmHg): 148/80 Body Mass Index(BMI): 26 Temperature(F): 97.7 Respiratory Rate(breaths/min): 18 [1:Photos:] [N/A:N/A] Right, Lateral Lower Leg N/A N/A Wound Location: Trauma N/A N/A Wounding Event: Abrasion N/A N/A Primary Etiology: Lymphedema N/A N/A Secondary Etiology: Congestive Heart Failure, N/A N/A Comorbid History: Hypertension, Osteoarthritis, Neuropathy 08/04/2021 N/A N/A Date Acquired: 7 N/A N/A Weeks of Treatment: Open N/A N/A Wound Status: No N/A N/A Wound Recurrence: 10.9x3.7x0.1 N/A N/A Measurements L x W x D (cm) 31.675 N/A  N/A A (cm) : rea 3.168 N/A N/A Volume (cm) : 40.70% N/A N/A % Reduction in A rea: 88.10% N/A N/A % Reduction in Volume: Full Thickness Without Exposed N/A N/A Classification: Support Structures Medium N/A N/A Exudate A mount: Serosanguineous N/A N/A Exudate Type: red, brown N/A N/A Exudate Color: Distinct, outline attached N/A N/A Wound Margin: Large (67-100%) N/A N/A Granulation A mount: Red, Pink, Hyper-granulation N/A N/A Granulation Quality: Small (1-33%) N/A N/A Necrotic A mount: Fat Layer (Subcutaneous Tissue): Yes N/A N/A Exposed Structures: Fascia: No Tendon: No Muscle: No Joint: No Bone: No Small (1-33%) N/A N/A Epithelialization: Debridement - Excisional N/A N/A Debridement: Pre-procedure Verification/Time Out 13:30 N/A N/A Taken: Lidocaine 5% topical ointment N/A N/A Pain Control: Subcutaneous, Slough N/A N/A Tissue Debrided: Skin/Subcutaneous Tissue N/A N/A Level: 40.33 N/A N/A Debridement A (sq cm): rea Curette N/A N/A Instrument: Minimum N/A N/A Bleeding: Pressure N/A N/A Hemostasis A chieved: 0 N/A N/A Procedural Pain: 0 N/A N/A Post Procedural Pain: Procedure was tolerated well N/A N/A Debridement Treatment  Response: 10.9x3.7x0.1 N/A N/A Post Debridement Measurements L x W x D (cm) 3.168 N/A N/A Post Debridement Volume: (cm) Excoriation: No N/A N/A Periwound Skin Texture: Induration: No Callus: No Crepitus: No Rash: No Scarring: No Maceration: No N/A N/A Periwound Skin Moisture: Dry/Scaly: No Hemosiderin Staining: Yes N/A N/A Periwound Skin Color: Atrophie Blanche: No Cyanosis: No Ecchymosis: No Erythema: No Mottled: No Pallor: No Rubor: No No Abnormality N/A N/A Temperature: Debridement N/A N/A Procedures Performed: Treatment Notes Wound #1 (Lower Leg) Wound Laterality: Right, Lateral Cleanser Soap and Water Discharge Instruction: May shower and wash wound with dial antibacterial soap and water prior to dressing change. Wound Cleanser Discharge Instruction: Cleanse the wound with wound cleanser prior to applying a clean dressing using gauze sponges, not tissue or cotton balls. Peri-Wound Care Zinc Oxide Ointment 30g tube Discharge Instruction: Apply Zinc Oxide to periwound with each dressing change Sween Lotion (Moisturizing lotion) Discharge Instruction: Apply moisturizing lotion as directed Vicki [name], Vicki [name] ([id number]) [id number] Page 4 of 8 Topical Gentamicin Discharge Instruction: add directly to wound bed. Primary Dressing PolyMem Silver Non-Adhesive Dressing, 4.25x4.25 in Discharge Instruction: Apply to wound bed as instructed Secondary Dressing Zetuvit Plus 4x8 in Discharge Instruction: Apply over primary dressing as directed. Secured With Compression Wrap Kerlix Roll 4.5x3.1 (in/yd) Discharge Instruction: Apply Kerlix and Coban compression as directed. Coban Self-Adherent Wrap 4x5 (in/yd) Discharge Instruction: Apply over Kerlix as directed. Compression Stockings Add-Ons Electronic Signature(s) Signed: 12/08/2021 1:58:40 PM By: Kalman Shan DO Signed: 12/08/2021 4:57:59 PM By: Deon Pilling RN, BSN Entered By: Kalman Shan on 12/08/2021 13:49:40 -------------------------------------------------------------------------------- Multi-Disciplinary Care Plan Details Patient Name: Date of Service: Vicki Rhymes T. 12/08/2021 12:45 PM Medical Record Number: [id number] Patient Account Number: [id number] Date of Birth/Sex: Treating RN: November 28, 1923 (88 y.o. [name], [name] Primary Care Lakeyshia Tuckerman: Elenor Quinones Other Clinician: Referring Danasia Baker: Treating Pearly Bartosik/Extender: Gust Rung in Treatment: 7 Active Inactive Nutrition Nursing Diagnoses: Potential for alteratiion in Nutrition/Potential for imbalanced nutrition Goals: Patient/caregiver agrees to and verbalizes understanding of need to obtain nutritional consultation Date Initiated: 10/20/2021 Target Resolution Date: 12/26/2021 Goal Status: Active Interventions: Provide education on nutrition Treatment Activities: Education provided on Nutrition : 10/20/2021 Patient referred to Primary Care Physician for further nutritional evaluation : 10/20/2021 Notes: Pain, Acute or Chronic Nursing Diagnoses: Pain, acute or chronic: actual or potential Vicki [name], Vicki [name] ([id number]) [id number] Page 5 of 8 Potential alteration in comfort, pain Goals: Patient will verbalize adequate pain  control and receive pain control interventions during procedures as needed Date Initiated: 10/20/2021 Target Resolution Date: 12/26/2021 Goal Status: Active Patient/caregiver will verbalize comfort level met Date Initiated: 10/20/2021 Date Inactivated: 11/04/2021 Target Resolution Date: 11/06/2021 Goal Status: Met Interventions: Assess comfort goal upon admission Provide education on pain management Treatment Activities: Administer pain control measures as ordered : 10/20/2021 Notes: Wound/Skin Impairment Nursing Diagnoses: Knowledge deficit related to ulceration/compromised skin integrity Goals: Patient/caregiver  will verbalize understanding of skin care regimen Date Initiated: 10/20/2021 Target Resolution Date: 12/26/2021 Goal Status: Active Interventions: Assess patient/caregiver ability to perform ulcer/skin care regimen upon admission and as needed Assess ulceration(s) every visit Provide education on ulcer and skin care Treatment Activities: Skin care regimen initiated : 10/20/2021 Topical wound management initiated : 10/20/2021 Notes: Electronic Signature(s) Signed: 12/08/2021 4:57:59 PM By: Deon Pilling RN, BSN Entered By: Deon Pilling on 12/08/2021 13:21:23 -------------------------------------------------------------------------------- Pain Assessment Details Patient Name: Date of Service: Vicki Rhymes T. 12/08/2021 12:45 PM Medical Record Number: [id number] Patient Account Number: [id number] Date of Birth/Sex: Treating RN: 1923-03-15 (88 y.o. [name] Primary Care Bulah Lurie: Elenor Quinones Other Clinician: Referring Jo Cerone: Treating Quaid Yeakle/Extender: Gust Rung in Treatment: 7 Active Problems Location of Pain Severity and Description of Pain Patient Has Paino Yes Site Locations Pain Location: Vicki [name], Vicki [name] ([id number]) [id number] Page 6 of 8 Pain Location: Generalized Pain Pain Management and Medication Current Pain Management: Electronic Signature(s) Signed: 12/08/2021 4:57:59 PM By: Deon Pilling RN, BSN Signed: 12/09/2021 8:52:44 AM By: Erenest Blank Entered By: Erenest Blank on 12/08/2021 13:11:12 -------------------------------------------------------------------------------- Patient/Caregiver Education Details Patient Name: Date of Service: Vicki [name] 10/9/2023andnbsp12:45 PM Medical Record Number: [id number] Patient Account Number: [id number] Date of Birth/Gender: Treating RN: 09/29/1923 (88 y.o. [name] Primary Care Physician: Elenor Quinones Other  Clinician: Referring Physician: Treating Physician/Extender: Gust Rung in Treatment: 7 Education Assessment Education Provided To: Patient Education Topics Provided Wound/Skin Impairment: Handouts: Skin Care Do's and Dont's Methods: Explain/Verbal Responses: Reinforcements needed Electronic Signature(s) Signed: 12/08/2021 4:57:59 PM By: Deon Pilling RN, BSN Entered By: Deon Pilling on 12/08/2021 13:21:34 -------------------------------------------------------------------------------- Wound Assessment Details Patient Name: Date of Service: Vicki Rhymes T. 12/08/2021 12:45 PM Medical Record Number: [id number] Patient Account Number: [id number] Date of Birth/Sex: Treating RN: [birth year] (88 y.o. [address] [place], [place] T ([id number]) [id number] Page 7 of 8 Primary Care Tobi Groesbeck: Elenor Quinones Other Clinician: Referring Yanet Balliet: Treating Asha Grumbine/Extender: Gust Rung in Treatment: 7 Wound Status Wound Number: 1 Primary Etiology: Abrasion Wound Location: Right, Lateral Lower Leg Secondary Lymphedema Etiology: Wounding Event: Trauma Wound Status: Open Date Acquired: 08/04/2021 Comorbid Congestive Heart Failure, Hypertension, Osteoarthritis, Weeks Of Treatment: 7 History: Neuropathy Clustered Wound: No Photos Wound Measurements Length: (cm) 10.9 Width: (cm) 3.7 Depth: (cm) 0.1 Area: (cm) 31.675 Volume: (cm) 3.168 % Reduction in Area: 40.7% % Reduction in Volume: 88.1% Epithelialization: Small (1-33%) Tunneling: No Undermining: No Wound Description Classification: Full Thickness Without Exposed Support Structures Wound Margin: Distinct, outline attached Exudate Amount: Medium Exudate Type: Serosanguineous Exudate Color: red, brown Foul Odor After Cleansing: No Slough/Fibrino Yes Wound Bed Granulation Amount: Large (67-100%) Exposed Structure Granulation Quality:  Red, Pink, Hyper-granulation Fascia Exposed: No Necrotic Amount: Small (1-33%) Fat Layer (Subcutaneous Tissue) Exposed: Yes Necrotic Quality: Adherent Slough Tendon Exposed: No Muscle Exposed: No Joint Exposed: No Bone Exposed: No Periwound Skin Texture Texture Color No Abnormalities Noted: No No Abnormalities Noted: No Callus: No Atrophie Blanche: No Crepitus: No  Cyanosis: No Excoriation: No Ecchymosis: No Induration: No Erythema: No Rash: No Hemosiderin Staining: Yes Scarring: No Mottled: No Pallor: No Moisture Rubor: No No Abnormalities Noted: No Dry / Scaly: No Temperature / Pain Maceration: No Temperature: No Abnormality Treatment Notes Wound #1 (Lower Leg) Wound Laterality: Right, Lateral Cleanser Soap and Water Discharge Instruction: May shower and wash wound with dial antibacterial soap and water prior to dressing change. Wound Cleanser Discharge Instruction: Cleanse the wound with wound cleanser prior to applying a clean dressing using gauze sponges, not tissue or cotton balls. Vicki [name], Vicki [name] ([id number]) [id number] Page 8 of 8 Peri-Wound Care Zinc Oxide Ointment 30g tube Discharge Instruction: Apply Zinc Oxide to periwound with each dressing change Sween Lotion (Moisturizing lotion) Discharge Instruction: Apply moisturizing lotion as directed Topical Gentamicin Discharge Instruction: add directly to wound bed. Primary Dressing PolyMem Silver Non-Adhesive Dressing, 4.25x4.25 in Discharge Instruction: Apply to wound bed as instructed Secondary Dressing Zetuvit Plus 4x8 in Discharge Instruction: Apply over primary dressing as directed. Secured With Compression Wrap Kerlix Roll 4.5x3.1 (in/yd) Discharge Instruction: Apply Kerlix and Coban compression as directed. Coban Self-Adherent Wrap 4x5 (in/yd) Discharge Instruction: Apply over Kerlix as directed. Compression Stockings Add-Ons Electronic Signature(s) Signed:  12/08/2021 4:57:59 PM By: Deon Pilling RN, BSN Entered By: Deon Pilling on 12/08/2021 13:17:03 -------------------------------------------------------------------------------- Vitals Details Patient Name: Date of Service: Vicki Rhymes T. 12/08/2021 12:45 PM Medical Record Number: [id number] Patient Account Number: [id number] Date of Birth/Sex: Treating RN: December 25, 1923 (88 y.o. [name], [name] Primary Care Kairie Vangieson: Elenor Quinones Other Clinician: Referring Buffie Herne: Treating Joanmarie Tsang/Extender: Gust Rung in Treatment: 7 Vital Signs Time Taken: 13:08 Temperature (F): 97.7 Height (in): 62 Pulse (bpm): 61 Weight (lbs): 142 Respiratory Rate (breaths/min): 18 Body Mass Index (BMI): 26 Blood Pressure (mmHg): 148/80 Reference Range: 80 - 120 mg / dl Electronic Signature(s) Signed: 12/09/2021 8:52:44 AM By: Erenest Blank Entered By: Erenest Blank on 12/08/2021 13:10:49

## 2021-12-15 ENCOUNTER — Encounter (HOSPITAL_BASED_OUTPATIENT_CLINIC_OR_DEPARTMENT_OTHER): Payer: Medicare Other | Admitting: Internal Medicine

## 2021-12-15 DIAGNOSIS — L97812 Non-pressure chronic ulcer of other part of right lower leg with fat layer exposed: Secondary | ICD-10-CM | POA: Diagnosis not present

## 2021-12-15 DIAGNOSIS — I87311 Chronic venous hypertension (idiopathic) with ulcer of right lower extremity: Secondary | ICD-10-CM | POA: Diagnosis not present

## 2021-12-15 NOTE — Progress Notes (Signed)
AUBRYANA, VITTORIO ([id number]) [id number] Page 1 of 8 Visit Report for 12/15/2021 Arrival Information Details Patient Name: Date of Service: Vicki [name], Vicki [name] 12/15/2021 12:45 PM Medical Record Number: [id number] Patient Account Number: [id number] Date of Birth/Sex: Treating RN: Jan 15, 1924 (88 y.o. F) Primary Care Provider: Elenor Quinones Other Clinician: Referring Provider: Treating Provider/Extender: Hartford Poli in Treatment: 8 Visit Information History Since Last Visit Added or deleted any medications: No Patient Arrived: Walker Any new allergies or adverse reactions: No Arrival Time: 09:48 Had a fall or experienced change in No Accompanied By: daughter activities of daily living that may affect Transfer Assistance: None risk of falls: Patient Identification Verified: Yes Signs or symptoms of abuse/neglect since last visito No Secondary Verification Process Completed: Yes Hospitalized since last visit: No Patient Requires Transmission-Based Precautions: No Implantable device outside of the clinic excluding No Patient Has Alerts: No cellular tissue based products placed in the center since last visit: Has Compression in Place as Prescribed: Yes Pain Present Now: No Electronic Signature(s) Signed: 12/15/2021 1:40:46 PM By: Erenest Blank Entered By: Erenest Blank on 12/15/2021 12:50:05 -------------------------------------------------------------------------------- Encounter Discharge Information Details Patient Name: Date of Service: Vicki Rhymes [name]. 12/15/2021 12:45 PM Medical Record Number: [id number] Patient Account Number: [id number] Date of Birth/Sex: Treating RN: [birth year] (88 y.o. Vicki [name] Primary Care Provider: Elenor Quinones Other Clinician: Referring Provider: Treating Provider/Extender: [place] Poli in Treatment: 8 Encounter Discharge Information  Items Post Procedure Vitals Discharge Condition: Stable Temperature (F): 97.9 Ambulatory Status: Walker Pulse (bpm): 64 Discharge Destination: Home Respiratory Rate (breaths/min): 18 Transportation: Private Auto Blood Pressure (mmHg): 149/71 Accompanied By: daughter Schedule Follow-up Appointment: Yes Clinical Summary of Care: Patient Declined Electronic Signature(s) Signed: 12/15/2021 1:44:39 PM By: Sharyn Creamer RN, BSN Entered By: Sharyn Creamer on 12/15/2021 13:28:30 [name] ([id number]) [id number] Page 2 of 8 -------------------------------------------------------------------------------- Lower Extremity Assessment Details Patient Name: Date of Service: Vicki [name], Vicki [name] 12/15/2021 12:45 PM Medical Record Number: [id number] Patient Account Number: [id number] Date of Birth/Sex: Treating RN: [birth year] (88 y.o. F) Primary Care Provider: Elenor Quinones Other Clinician: Referring Provider: Treating Provider/Extender: [place] Poli in Treatment: 8 Edema Assessment Assessed: [Left: No] [Right: No] Edema: [Left: N] [Right: o] Calf Left: Right: Point of Measurement: 28 cm From Medial Instep 33 cm Ankle Left: Right: Point of Measurement: 12 cm From Medial Instep 21 cm Vascular Assessment Pulses: Dorsalis Pedis Palpable: [Right:Yes] Electronic Signature(s) Signed: 12/15/2021 1:40:46 PM By: Erenest Blank Entered By: Erenest Blank on 12/15/2021 12:57:24 -------------------------------------------------------------------------------- Multi Wound Chart Details Patient Name: Date of Service: Vicki Rhymes [name]. 12/15/2021 12:45 PM Medical Record Number: [id number] Patient Account Number: [id number] Date of Birth/Sex: Treating RN: [birth year] (88 y.o. F) Primary Care Provider: Elenor Quinones Other Clinician: Referring Provider: Treating Provider/Extender: [place] Poli  in Treatment: 8 Vital Signs Height(in): 62 Pulse(bpm): 60 Weight(lbs): 142 Blood Pressure(mmHg): 149/71 Body Mass Index(BMI): 26 Temperature(F): 97.9 Respiratory Rate(breaths/min): 18 [1:Photos:] [N/A:N/A] Right, Lateral Lower Leg N/A N/A Wound Location: Trauma N/A N/A Wounding Event: Abrasion N/A N/A Primary Etiology: Lymphedema N/A N/A Secondary Etiology: Congestive Heart Failure, N/A N/A Comorbid History: Hypertension, Osteoarthritis, Neuropathy 08/04/2021 N/A N/A Date Acquired: 8 N/A N/A Weeks of Treatment: Open N/A N/A Wound Status: No N/A N/A Wound Recurrence: 10x3.4x0.1 N/A N/A Measurements L x W x D (cm) 26.704 N/A N/A A (cm) : rea 2.67 N/A N/A Volume (cm) : 50.00% N/A N/A % Reduction in A rea: 90.00%  N/A N/A % Reduction in Volume: Full Thickness Without Exposed N/A N/A Classification: Support Structures Medium N/A N/A Exudate A mount: Serosanguineous N/A N/A Exudate Type: red, brown N/A N/A Exudate Color: Distinct, outline attached N/A N/A Wound Margin: Large (67-100%) N/A N/A Granulation A mount: Red, Pink, Hyper-granulation N/A N/A Granulation Quality: Small (1-33%) N/A N/A Necrotic A mount: Fat Layer (Subcutaneous Tissue): Yes N/A N/A Exposed Structures: Fascia: No Tendon: No Muscle: No Joint: No Bone: No Medium (34-66%) N/A N/A Epithelialization: Debridement - Excisional N/A N/A Debridement: Pre-procedure Verification/Time Out 13:23 N/A N/A Taken: Lidocaine 5% topical ointment N/A N/A Pain Control: Subcutaneous, Slough N/A N/A Tissue Debrided: Skin/Subcutaneous Tissue N/A N/A Level: 34 N/A N/A Debridement A (sq cm): rea Curette N/A N/A Instrument: Minimum N/A N/A Bleeding: Pressure N/A N/A Hemostasis A chieved: 0 N/A N/A Procedural Pain: 0 N/A N/A Post Procedural Pain: Procedure was tolerated well N/A N/A Debridement Treatment Response: 10x3.4x0.1 N/A N/A Post Debridement Measurements L x W x D (cm) 2.67 N/A  N/A Post Debridement Volume: (cm) Excoriation: No N/A N/A Periwound Skin Texture: Induration: No Callus: No Crepitus: No Rash: No Scarring: No Maceration: No N/A N/A Periwound Skin Moisture: Dry/Scaly: No Hemosiderin Staining: Yes N/A N/A Periwound Skin Color: Atrophie Blanche: No Cyanosis: No Ecchymosis: No Erythema: No Mottled: No Pallor: No Rubor: No No Abnormality N/A N/A Temperature: Debridement N/A N/A Procedures Performed: Treatment Notes Wound #1 (Lower Leg) Wound Laterality: Right, Lateral Cleanser Soap and Water Discharge Instruction: May shower and wash wound with dial antibacterial soap and water prior to dressing change. Wound Cleanser Discharge Instruction: Cleanse the wound with wound cleanser prior to applying a clean dressing using gauze sponges, not tissue or cotton balls. Peri-Wound Care Zinc Oxide Ointment 30g tube Discharge Instruction: Apply Zinc Oxide to periwound with each dressing change Sween Lotion (Moisturizing lotion) Discharge Instruction: Apply moisturizing lotion as directed Vicki [name], Vicki [name] ([id number]) [id number] Page 4 of 8 Topical Gentamicin Discharge Instruction: add directly to wound bed. Primary Dressing PolyMem Silver Non-Adhesive Dressing, 4.25x4.25 in Discharge Instruction: Apply to wound bed as instructed Secondary Dressing Zetuvit Plus 4x8 in Discharge Instruction: Apply over primary dressing as directed. Secured With Compression Wrap Kerlix Roll 4.5x3.1 (in/yd) Discharge Instruction: Apply Kerlix and Coban compression as directed. Coban Self-Adherent Wrap 4x5 (in/yd) Discharge Instruction: Apply over Kerlix as directed. Compression Stockings Add-Ons Electronic Signature(s) Signed: 12/15/2021 3:56:07 PM By: Kalman Shan DO Entered By: Kalman Shan on 12/15/2021 13:28:28 -------------------------------------------------------------------------------- Multi-Disciplinary Care Plan  Details Patient Name: Date of Service: Vicki Rhymes [name]. 12/15/2021 12:45 PM Medical Record Number: [id number] Patient Account Number: [id number] Date of Birth/Sex: Treating RN: [birth year] (88 y.o. Vicki [name] Primary Care Provider: Elenor Quinones Other Clinician: Referring Provider: Treating Provider/Extender: [place] Poli in Treatment: 8 Active Inactive Nutrition Nursing Diagnoses: Potential for alteratiion in Nutrition/Potential for imbalanced nutrition Goals: Patient/caregiver agrees to and verbalizes understanding of need to obtain nutritional consultation Date Initiated: 10/20/2021 Target Resolution Date: 12/26/2021 Goal Status: Active Interventions: Provide education on nutrition Treatment Activities: Education provided on Nutrition : 11/24/2021 Patient referred to Primary Care Physician for further nutritional evaluation : 10/20/2021 Notes: Pain, Acute or Chronic Nursing Diagnoses: Pain, acute or chronic: actual or potential Potential alteration in comfort, pain Vicki [name], Vicki [name] ([id number]) [id number] Page 5 of 8 Goals: Patient will verbalize adequate pain control and receive pain control interventions during procedures as needed Date Initiated: 10/20/2021 Target Resolution Date: 12/26/2021 Goal Status: Active Patient/caregiver will verbalize comfort level met Date Initiated: 10/20/2021 Date  Inactivated: 11/04/2021 Target Resolution Date: 11/06/2021 Goal Status: Met Interventions: Assess comfort goal upon admission Provide education on pain management Treatment Activities: Administer pain control measures as ordered : 10/20/2021 Notes: Wound/Skin Impairment Nursing Diagnoses: Knowledge deficit related to ulceration/compromised skin integrity Goals: Patient/caregiver will verbalize understanding of skin care regimen Date Initiated: 10/20/2021 Target Resolution Date: 12/26/2021 Goal Status:  Active Interventions: Assess patient/caregiver ability to perform ulcer/skin care regimen upon admission and as needed Assess ulceration(s) every visit Provide education on ulcer and skin care Treatment Activities: Skin care regimen initiated : 10/20/2021 Topical wound management initiated : 10/20/2021 Notes: Electronic Signature(s) Signed: 12/15/2021 1:44:39 PM By: Sharyn Creamer RN, BSN Entered By: Sharyn Creamer on 12/15/2021 13:00:47 -------------------------------------------------------------------------------- Pain Assessment Details Patient Name: Date of Service: Vicki Rhymes [name]. 12/15/2021 12:45 PM Medical Record Number: [id number] Patient Account Number: [id number] Date of Birth/Sex: Treating RN: Aug 18, 1923 (88 y.o. F) Primary Care Provider: Elenor Quinones Other Clinician: Referring Provider: Treating Provider/Extender: [place] Poli in Treatment: 8 Active Problems Location of Pain Severity and Description of Pain Patient Has Paino No Site Locations [place], [place] [name] ([id number]) [id number] Page 6 of 8 Pain Management and Medication Current Pain Management: Electronic Signature(s) Signed: 12/15/2021 1:40:46 PM By: Erenest Blank Entered By: Erenest Blank on 12/15/2021 12:50:41 -------------------------------------------------------------------------------- Patient/Caregiver Education Details Patient Name: Date of Service: Vicki [name] 10/16/2023andnbsp12:45 PM Medical Record Number: [id number] Patient Account Number: [id number] Date of Birth/Gender: Treating RN: 06/16/1923 (88 y.o. Vicki [name] Primary Care Physician: Elenor Quinones Other Clinician: Referring Physician: Treating Physician/Extender: [place] Poli in Treatment: 8 Education Assessment Education Provided To: Patient Education Topics Provided Wound/Skin Impairment: Methods:  Explain/Verbal Responses: State content correctly Electronic Signature(s) Signed: 12/15/2021 1:44:39 PM By: Sharyn Creamer RN, BSN Entered By: Sharyn Creamer on 12/15/2021 13:01:16 -------------------------------------------------------------------------------- Wound Assessment Details Patient Name: Date of Service: Vicki Rhymes [name]. 12/15/2021 12:45 PM Medical Record Number: [id number] Patient Account Number: [id number] Date of Birth/Sex: Treating RN: Aug 24, 1923 (88 y.o. F) Primary Care Provider: Elenor Quinones Other Clinician: Referring Provider: Treating Provider/Extender: Carlena Sax, Daisy Floro ([id number]) [id number] Page 7 of 8 Weeks in Treatment: 8 Wound Status Wound Number: 1 Primary Etiology: Abrasion Wound Location: Right, Lateral Lower Leg Secondary Lymphedema Etiology: Wounding Event: Trauma Wound Status: Open Date Acquired: 08/04/2021 Comorbid Congestive Heart Failure, Hypertension, Osteoarthritis, Weeks Of Treatment: 8 History: Neuropathy Clustered Wound: No Photos Wound Measurements Length: (cm) 10 Width: (cm) 3.4 Depth: (cm) 0.1 Area: (cm) 26.704 Volume: (cm) 2.67 % Reduction in Area: 50% % Reduction in Volume: 90% Epithelialization: Medium (34-66%) Tunneling: No Undermining: No Wound Description Classification: Full Thickness Without Exposed Support Structures Wound Margin: Distinct, outline attached Exudate Amount: Medium Exudate Type: Serosanguineous Exudate Color: red, brown Foul Odor After Cleansing: No Slough/Fibrino Yes Wound Bed Granulation Amount: Large (67-100%) Exposed Structure Granulation Quality: Red, Pink, Hyper-granulation Fascia Exposed: No Necrotic Amount: Small (1-33%) Fat Layer (Subcutaneous Tissue) Exposed: Yes Necrotic Quality: Adherent Slough Tendon Exposed: No Muscle Exposed: No Joint Exposed: No Bone Exposed: No Periwound Skin Texture Texture Color No  Abnormalities Noted: No No Abnormalities Noted: No Callus: No Atrophie Blanche: No Crepitus: No Cyanosis: No Excoriation: No Ecchymosis: No Induration: No Erythema: No Rash: No Hemosiderin Staining: Yes Scarring: No Mottled: No Pallor: No Moisture Rubor: No No Abnormalities Noted: No Dry / Scaly: No Temperature / Pain Maceration: No Temperature: No Abnormality Treatment Notes Wound #1 (Lower Leg) Wound Laterality: Right, Lateral Cleanser Soap and Water Discharge Instruction: May  shower and wash wound with dial antibacterial soap and water prior to dressing change. Wound Cleanser Discharge Instruction: Cleanse the wound with wound cleanser prior to applying a clean dressing using gauze sponges, not tissue or cotton balls. Peri-Wound Care Zinc Oxide Ointment 30g tube Vicki [name], Vicki [name] ([id number]) [id number] Page 8 of 8 Discharge Instruction: Apply Zinc Oxide to periwound with each dressing change Sween Lotion (Moisturizing lotion) Discharge Instruction: Apply moisturizing lotion as directed Topical Gentamicin Discharge Instruction: add directly to wound bed. Primary Dressing PolyMem Silver Non-Adhesive Dressing, 4.25x4.25 in Discharge Instruction: Apply to wound bed as instructed Secondary Dressing Zetuvit Plus 4x8 in Discharge Instruction: Apply over primary dressing as directed. Secured With Compression Wrap Kerlix Roll 4.5x3.1 (in/yd) Discharge Instruction: Apply Kerlix and Coban compression as directed. Coban Self-Adherent Wrap 4x5 (in/yd) Discharge Instruction: Apply over Kerlix as directed. Compression Stockings Add-Ons Electronic Signature(s) Signed: 12/15/2021 1:44:39 PM By: Sharyn Creamer RN, BSN Entered By: Sharyn Creamer on 12/15/2021 13:03:16 -------------------------------------------------------------------------------- [place] Details Patient Name: Date of Service: Vicki Rhymes [name]. 12/15/2021 12:45 PM Medical Record  Number: [id number] Patient Account Number: [id number] Date of Birth/Sex: Treating RN: [birth year] (88 y.o. F) Primary Care Angelo Caroll: Elenor Quinones Other Clinician: Referring Brianna Esson: Treating Maliq Pilley/Extender: [place] Poli in Treatment: 8 Vital Signs Time Taken: 12:50 Temperature (F): 97.9 Height (in): 62 Pulse (bpm): 64 Weight (lbs): 142 Respiratory Rate (breaths/min): 18 Body Mass Index (BMI): 26 Blood Pressure (mmHg): 149/71 Reference Range: 80 - 120 mg / dl Electronic Signature(s) Signed: 12/15/2021 1:40:46 PM By: Erenest Blank Entered By: Erenest Blank on 12/15/2021 12:50:32

## 2021-12-15 NOTE — Progress Notes (Signed)
Vicki [name], Vicki [name] ([id number]) [id number] Page 1 of 9 Visit Report for 12/15/2021 Chief Complaint Document Details Patient Name: Date of Service: Vicki [name] LDERMA [name], [name]. 12/15/2021 12:45 PM Medical Record Number: [id number] Patient Account Number: [id number] Date of Birth/Sex: Treating RN: 27-Oct-1923 (88 y.o. F) Primary Care Provider: Pricilla Holmyter-Brown, Sherry Other Clinician: Referring Provider: Treating Provider/Extender: Elicia LampHoffman, Usbaldo Pannone Ryter-Brown, Sherry Weeks in Treatment: 8 Information Obtained from: Patient Chief Complaint 10/20/2021; right lower extremity wound status post trauma Electronic Signature(s) Signed: 12/15/2021 3:56:07 PM By: Geralyn CorwinHoffman, Clara Smolen DO Entered By: Geralyn CorwinHoffman, Mizuki Hoel on 12/15/2021 13:28:34 -------------------------------------------------------------------------------- Debridement Details Patient Name: Date of Service: Vicki [name]. 12/15/2021 12:45 PM Medical Record Number: [id number] Patient Account Number: [id number] Date of Birth/Sex: Treating RN: 27-Oct-1923 (88 y.o. Vicki GovernF) Palmer, Carrie Primary Care Provider: Pricilla Holmyter-Brown, Sherry Other Clinician: Referring Provider: Treating Provider/Extender: Elicia LampHoffman, [name] Ryter-Brown, Sherry Weeks in Treatment: 8 Debridement Performed for Assessment: Wound #1 Right,Lateral Lower Leg Performed By: Physician Geralyn CorwinHoffman, Zaydn Gutridge, DO Debridement Type: Debridement Level of Consciousness (Pre-procedure): Awake and Alert Pre-procedure Verification/Time Out Yes - 13:23 Taken: Start Time: 13:23 Pain Control: Lidocaine 5% topical ointment [name] Area Debrided (L x W): otal 10 (cm) x 3.4 (cm) = 34 (cm) Tissue and other material debrided: Non-Viable, Slough, Subcutaneous, Slough Level: Skin/Subcutaneous Tissue Debridement Description: Excisional Instrument: Curette Bleeding: Minimum Hemostasis Achieved: Silver Nitrate Procedural Pain: 0 Post Procedural Pain: 0 Response to Treatment: Procedure was  tolerated well Level of Consciousness (Post- Awake and Alert procedure): Post Debridement Measurements of Total Wound Length: (cm) 10 Width: (cm) 3.4 Depth: (cm) 0.1 Volume: (cm) 2.67 Character of Wound/Ulcer Post Debridement: Improved Post Procedure Diagnosis Same as Pre-procedure Vicki [name] ([id number]) [id number] Page 2 of 9 Electronic Signature(s) Signed: 12/15/2021 3:56:07 PM By: Geralyn CorwinHoffman, Tenna Lacko DO Signed: 12/15/2021 1:44:39 PM By: Redmond PullingPalmer, Carrie RN, BSN Entered By: Redmond PullingPalmer, Carrie on 12/15/2021 13:42:22 -------------------------------------------------------------------------------- HPI Details Patient Name: Date of Service: Vicki [name]. 12/15/2021 12:45 PM Medical Record Number: [id number] Patient Account Number: [id number] Date of Birth/Sex: Treating RN: 27-Oct-1923 (88 y.o. F) Primary Care Provider: Pricilla Holmyter-Brown, Sherry Other Clinician: Referring Provider: Treating Provider/Extender: Elicia LampHoffman, Trinady Milewski Ryter-Brown, Sherry Weeks in Treatment: 8 History of Present Illness HPI Description: Admission 10/20/2021 Vicki [name] is a 88 year old female with a past medical history of hypothyroidism and essential hypertension that presents to the clinic for a 209-month history of ulcer to the right lower extremity. On 08/15/2021 the patient experienced a mechanical fall and hit her right leg against her table. She developed a hematoma and she was evaluated in the ED for this issue. The hematoma was evacuated in the ED. She had x-rays of the tibia/fibula and ankle that showed no acute osseous abnormalities. She has been using silver alginate to the wound bed. She did have 2 rounds of doxycycline for this issue and recently completed her second course. She currently denies signs of infection. 8/28; patient presents for follow-up. She has no issues or complaints today. We have been doing Santyl and Hydrofera Blue under Kerlix/Coban. She has home  health that they start coming out once weekly. She denies signs of infection. 9/5; patient presents for follow-up. We have been doing Hydrofera Blue with antibiotic ointment under Kerlix/Coban. She has no issues or complaints today. 9/11; patient presents for follow-up. We have been doing Hydrofera Blue under Kerlix/Coban. Home health has been changing the dressing weekly. The wrap was too tight at the more proximal and cutting into the patient's skin. Other than that she has no issues or  complaints today. 9/18; patient presents for follow-up. We have been using PolyMem silver under Kerlix/Coban for the past week. She has no issues or complaints today. 9/25; patient presents for follow-up. We have been using PolyMem silver under Kerlix/Coban. She has no issues or complaints today. 10/2; patient presents for follow-up. We have been using PolyMem silver with antibiotic ointment under compression therapy. Patient has no issues or complaints today. 10/9; patient presents for follow-up. We have been using PolyMem silver under compression therapy. Patient has no issues or complaints today. She reports improvement in wound healing. 10/16; patient presents for follow-up. We have been using PolyMem silver with antibiotic ointment under compression therapy. Patient has no issues or complaints today. Electronic Signature(s) Signed: 12/15/2021 3:56:07 PM By: Geralyn Corwin DO Entered By: Geralyn Corwin on 12/15/2021 13:28:54 -------------------------------------------------------------------------------- Physical Exam Details Patient Name: Date of Service: Vicki [name] [name]. 12/15/2021 12:45 PM Medical Record Number: [id number] Patient Account Number: [id number] Date of Birth/Sex: Treating RN: 09-Jan-1924 (88 y.o. F) Primary Care Provider: Pricilla Holm Other Clinician: Referring Provider: Treating Provider/Extender: Elicia Lamp in Treatment:  8 Constitutional respirations regular, non-labored and within target range for patient.Thera Flake, Arnette Schaumann ([id number]) [id number] Page 3 of 9 Cardiovascular 2+ dorsalis pedis/posterior tibialis pulses. Psychiatric pleasant and cooperative. Notes Right lower extremity: [name] the anterior aspect there is a large open wound with granulation tissue, nonviable tissue and hypergranulated areas. No signs of o surrounding infection. Venous stasis dermatitis. Electronic Signature(s) Signed: 12/15/2021 3:56:07 PM By: Geralyn Corwin DO Entered By: Geralyn Corwin on 12/15/2021 13:29:26 -------------------------------------------------------------------------------- Physician Orders Details Patient Name: Date of Service: Vicki [name] [name]. 12/15/2021 12:45 PM Medical Record Number: [id number] Patient Account Number: [id number] Date of Birth/Sex: Treating RN: 12-Nov-1923 (88 y.o. Vicki [name] Primary Care Provider: Pricilla Holm Other Clinician: Referring Provider: Treating Provider/Extender: Elicia Lamp in Treatment: 8 Verbal / Phone Orders: No Diagnosis Coding Follow-up Appointments ppointment in 1 week. - Dr. Mikey Bussing and Yvonne Kendall, Room 8 Return A Anesthetic (In clinic) Topical Lidocaine 5% applied to wound bed Cellular or Tissue Based Products Wound #1 Right,Lateral Lower Leg Cellular or Tissue Based Product Type: - run insurance authorization for advance tissue products. Bathing/ Shower/ Hygiene May shower with protection but do not get wound dressing(s) wet. Edema Control - Lymphedema / SCD / Other Elevate legs to the level of the heart or above for 30 minutes daily and/or when sitting, a frequency of: - 3-4 times a day throughout the day. Avoid standing for long periods of time. Exercise regularly Wound Treatment Wound #1 - Lower Leg Wound Laterality: Right, Lateral Cleanser: Soap and Water 1 x Per Week/30  Days Discharge Instructions: May shower and wash wound with dial antibacterial soap and water prior to dressing change. Cleanser: Wound Cleanser 1 x Per Week/30 Days Discharge Instructions: Cleanse the wound with wound cleanser prior to applying a clean dressing using gauze sponges, not tissue or cotton balls. Peri-Wound Care: Zinc Oxide Ointment 30g tube 1 x Per Week/30 Days Discharge Instructions: Apply Zinc Oxide to periwound with each dressing change Peri-Wound Care: Sween Lotion (Moisturizing lotion) 1 x Per Week/30 Days Discharge Instructions: Apply moisturizing lotion as directed Topical: Gentamicin 1 x Per Week/30 Days Discharge Instructions: add directly to wound bed. Prim Dressing: PolyMem Silver Non-Adhesive Dressing, 4.25x4.25 in 1 x Per Week/30 Days ary Discharge Instructions: Apply to wound bed as instructed Secondary Dressing: Zetuvit Plus 4x8 in 1 x Per Week/30 Days Discharge Instructions: Apply over primary  dressing as directed. Compression Wrap: Kerlix Roll 4.5x3.1 (in/yd) [facility]) 1 x Per Week/30 Days Vicki [name], Vicki [name] ([id number]) [id number] Page 4 of 9 Discharge Instructions: Apply Kerlix and Coban compression as directed. Compression Wrap: Coban Self-Adherent Wrap 4x5 (in/yd) (Home Health) 1 x Per Week/30 Days Discharge Instructions: Apply over Kerlix as directed. Patient Medications llergies: penicillin, codeine, Sulfa (Sulfonamide Antibiotics), latex, penbutolol A Notifications Medication Indication Start End prior to debridement 12/15/2021 lidocaine DOSE topical 5 % ointment - ointment topical Electronic Signature(s) Signed: 12/15/2021 3:56:07 PM By: Geralyn Corwin DO Entered By: Geralyn Corwin on 12/15/2021 13:29:32 -------------------------------------------------------------------------------- Problem List Details Patient Name: Date of Service: Vicki [name] [name]. 12/15/2021 12:45 PM Medical Record Number:  [id number] Patient Account Number: [id number] Date of Birth/Sex: Treating RN: 1923-11-24 (88 y.o. F) Primary Care Provider: Pricilla Holm Other Clinician: Referring Provider: Treating Provider/Extender: Elicia Lamp in Treatment: 8 Active Problems ICD-10 Encounter Code Description Active Date MDM Diagnosis L97.812 Non-pressure chronic ulcer of other part of right lower leg with fat layer 10/20/2021 No Yes exposed T79.8XXA Other early complications of trauma, initial encounter 10/20/2021 No Yes E03.9 Hypothyroidism, unspecified 10/20/2021 No Yes I87.311 Chronic venous hypertension (idiopathic) with ulcer of right lower extremity 10/20/2021 No Yes Inactive Problems Resolved Problems Electronic Signature(s) Signed: 12/15/2021 3:56:07 PM By: Geralyn Corwin DO Entered By: Geralyn Corwin on 12/15/2021 95:28:41 Vicki Purpura ([id number]) [id number] Page 5 of 9 -------------------------------------------------------------------------------- Progress Note Details Patient Name: Date of Service: Vicki [name], Vicki [name] 12/15/2021 12:45 PM Medical Record Number: [id number] Patient Account Number: [id number] Date of Birth/Sex: Treating RN: 08/26/1923 (88 y.o. F) Primary Care Provider: Pricilla Holm Other Clinician: Referring Provider: Treating Provider/Extender: Elicia Lamp in Treatment: 8 Subjective Chief Complaint Information obtained from Patient 10/20/2021; right lower extremity wound status post trauma History of Present Illness (HPI) Admission 10/20/2021 Ms. Tehila Sokolow is a 88 year old female with a past medical history of hypothyroidism and essential hypertension that presents to the clinic for a 70-month history of ulcer to the right lower extremity. On 08/15/2021 the patient experienced a mechanical fall and hit her right leg against her table. She developed a hematoma and she was  evaluated in the ED for this issue. The hematoma was evacuated in the ED. She had x-rays of the tibia/fibula and ankle that showed no acute osseous abnormalities. She has been using silver alginate to the wound bed. She did have 2 rounds of doxycycline for this issue and recently completed her second course. She currently denies signs of infection. 8/28; patient presents for follow-up. She has no issues or complaints today. We have been doing Santyl and Hydrofera Blue under Kerlix/Coban. She has home health that they start coming out once weekly. She denies signs of infection. 9/5; patient presents for follow-up. We have been doing Hydrofera Blue with antibiotic ointment under Kerlix/Coban. She has no issues or complaints today. 9/11; patient presents for follow-up. We have been doing Hydrofera Blue under Kerlix/Coban. Home health has been changing the dressing weekly. The wrap was too tight at the more proximal and cutting into the patient's skin. Other than that she has no issues or complaints today. 9/18; patient presents for follow-up. We have been using PolyMem silver under Kerlix/Coban for the past week. She has no issues or complaints today. 9/25; patient presents for follow-up. We have been using PolyMem silver under Kerlix/Coban. She has no issues or complaints today. 10/2; patient presents for follow-up. We have been using PolyMem silver with antibiotic  ointment under compression therapy. Patient has no issues or complaints today. 10/9; patient presents for follow-up. We have been using PolyMem silver under compression therapy. Patient has no issues or complaints today. She reports improvement in wound healing. 10/16; patient presents for follow-up. We have been using PolyMem silver with antibiotic ointment under compression therapy. Patient has no issues or complaints today. Patient History Information obtained from Patient. Family History Diabetes - Child, Heart Disease -  Child,Mother,Father,Siblings, Hypertension - Father,Mother, No family history of Cancer, Hereditary Spherocytosis, Kidney Disease, Lung Disease, Seizures, Stroke, Thyroid Problems, Tuberculosis. Social History Never smoker, Marital Status - Widowed, Alcohol Use - Never, Drug Use - No History, Caffeine Use - Rarely. Medical History Eyes Denies history of Cataracts, Glaucoma, Optic Neuritis Ear/Nose/Mouth/Throat Denies history of Chronic sinus problems/congestion, Middle ear problems Hematologic/Lymphatic Denies history of Anemia, Hemophilia, Human Immunodeficiency Virus, Lymphedema, Sickle Cell Disease Respiratory Denies history of Aspiration, Asthma, Chronic Obstructive Pulmonary Disease (COPD), Pneumothorax, Sleep Apnea, Tuberculosis Cardiovascular Patient has history of Congestive Heart Failure, Hypertension Denies history of Angina, Arrhythmia, Coronary Artery Disease, Deep Vein Thrombosis, Hypotension, Myocardial Infarction, Peripheral Arterial Disease, Peripheral Venous Disease, Phlebitis, Vasculitis Gastrointestinal Denies history of Cirrhosis , Colitis, Crohnoos, Hepatitis A, Hepatitis B, Hepatitis C Endocrine Denies history of Type I Diabetes, Type II Diabetes Immunological Denies history of Lupus Erythematosus, Raynaudoos, Scleroderma Integumentary (Skin) Denies history of History of Burn Musculoskeletal Patient has history of Osteoarthritis Denies history of Gout, Rheumatoid Arthritis, Osteomyelitis Neurologic Patient has history of Neuropathy Vicki [name], Vicki [name] ([id number]) [id number] Page 6 of 9 Denies history of Dementia, Quadriplegia, Paraplegia, Seizure Disorder Psychiatric Denies history of Anorexia/bulimia, Confinement Anxiety Hospitalization/Surgery History - back surgery. - right hip surgery. - hysterotomy. Medical A Surgical History Notes nd Ear/Nose/Mouth/Throat hearing aides Genitourinary Stage  three Objective Constitutional respirations regular, non-labored and within target range for patient.. Vitals Time Taken: 12:50 PM, Height: 62 in, Weight: 142 lbs, BMI: 26, Temperature: 97.9 F, Pulse: 64 bpm, Respiratory Rate: 18 breaths/min, Blood Pressure: 149/71 mmHg. Cardiovascular 2+ dorsalis pedis/posterior tibialis pulses. Psychiatric pleasant and cooperative. General Notes: Right lower extremity: [name] the anterior aspect there is a large open wound with granulation tissue, nonviable tissue and hypergranulated areas. o No signs of surrounding infection. Venous stasis dermatitis. Integumentary (Hair, Skin) Wound #1 status is Open. Original cause of wound was Trauma. The date acquired was: 08/04/2021. The wound has been in treatment 8 weeks. The wound is located on the Right,Lateral Lower Leg. The wound measures 10cm length x 3.4cm width x 0.1cm depth; 26.704cm^2 area and 2.67cm^3 volume. There is Fat Layer (Subcutaneous Tissue) exposed. There is no tunneling or undermining noted. There is a medium amount of serosanguineous drainage noted. The wound margin is distinct with the outline attached to the wound base. There is large (67-100%) red, pink, hyper - granulation within the wound bed. There is a small (1- 33%) amount of necrotic tissue within the wound bed including Adherent Slough. The periwound skin appearance exhibited: Hemosiderin Staining. The periwound skin appearance did not exhibit: Callus, Crepitus, Excoriation, Induration, Rash, Scarring, Dry/Scaly, Maceration, Atrophie Blanche, Cyanosis, Ecchymosis, Mottled, Pallor, Rubor, Erythema. Periwound temperature was noted as No Abnormality. Assessment Active Problems ICD-10 Non-pressure chronic ulcer of other part of right lower leg with fat layer exposed Other early complications of trauma, initial encounter Hypothyroidism, unspecified Chronic venous hypertension (idiopathic) with ulcer of right lower extremity Patient's wound  has shown improvement in size and appearance since last clinic visit. I debrided nonviable tissue and used silver nitrate to the  hyper granulated areas. I recommended continuing the course with PolyMem silver and antibiotic ointment under Kerlix/Coban. Follow-up in 1 week. Procedures Wound #1 Pre-procedure diagnosis of Wound #1 is an Abrasion located on the Right,Lateral Lower Leg . There was a Excisional Skin/Subcutaneous Tissue Debridement with a total area of 34 sq cm performed by Geralyn Corwin, DO. With the following instrument(s): Curette to remove Non-Viable tissue/material. Material removed includes Subcutaneous Tissue and Slough and after achieving pain control using Lidocaine 5% topical ointment. No specimens were taken. A time out was conducted at 13:23, prior to the start of the procedure. A Minimum amount of bleeding was controlled with Pressure. The procedure was tolerated well with a pain level of 0 throughout and a pain level of 0 following the procedure. Post Debridement Measurements: 10cm length x 3.4cm width x 0.1cm depth; 2.67cm^3 volume. Character of Wound/Ulcer Post Debridement is improved. Post procedure Diagnosis Wound #1: Same as Pre-Procedure Vicki [name], Vicki [name] ([id number]) [id number] Page 7 of 9 Plan Follow-up Appointments: Return Appointment in 1 week. - Dr. Mikey Bussing and Yvonne Kendall, Room 8 Anesthetic: (In clinic) Topical Lidocaine 5% applied to wound bed Cellular or Tissue Based Products: Wound #1 Right,Lateral Lower Leg: Cellular or Tissue Based Product Type: - run insurance authorization for advance tissue products. Bathing/ Shower/ Hygiene: May shower with protection but do not get wound dressing(s) wet. Edema Control - Lymphedema / SCD / Other: Elevate legs to the level of the heart or above for 30 minutes daily and/or when sitting, a frequency of: - 3-4 times a day throughout the day. Avoid standing for long periods of time. Exercise  regularly The following medication(s) was prescribed: lidocaine topical 5 % ointment ointment topical for prior to debridement was prescribed at facility WOUND #1: - Lower Leg Wound Laterality: Right, Lateral Cleanser: Soap and Water 1 x Per Week/30 Days Discharge Instructions: May shower and wash wound with dial antibacterial soap and water prior to dressing change. Cleanser: Wound Cleanser 1 x Per Week/30 Days Discharge Instructions: Cleanse the wound with wound cleanser prior to applying a clean dressing using gauze sponges, not tissue or cotton balls. Peri-Wound Care: Zinc Oxide Ointment 30g tube 1 x Per Week/30 Days Discharge Instructions: Apply Zinc Oxide to periwound with each dressing change Peri-Wound Care: Sween Lotion (Moisturizing lotion) 1 x Per Week/30 Days Discharge Instructions: Apply moisturizing lotion as directed Topical: Gentamicin 1 x Per Week/30 Days Discharge Instructions: add directly to wound bed. Prim Dressing: PolyMem Silver Non-Adhesive Dressing, 4.25x4.25 in 1 x Per Week/30 Days ary Discharge Instructions: Apply to wound bed as instructed Secondary Dressing: Zetuvit Plus 4x8 in 1 x Per Week/30 Days Discharge Instructions: Apply over primary dressing as directed. Com pression Wrap: Kerlix Roll 4.5x3.1 (in/yd) (Home Health) 1 x Per Week/30 Days Discharge Instructions: Apply Kerlix and Coban compression as directed. Com pression Wrap: Coban Self-Adherent Wrap 4x5 (in/yd) (Home Health) 1 x Per Week/30 Days Discharge Instructions: Apply over Kerlix as directed. 1. In office sharp debridement 2. PolyMem silver with antibiotic ointment under Kerlix/Coban 3. Follow-up in 1 week Electronic Signature(s) Signed: 12/15/2021 3:56:07 PM By: Geralyn Corwin DO Entered By: Geralyn Corwin on 12/15/2021 13:30:10 -------------------------------------------------------------------------------- HxROS Details Patient Name: Date of Service: Vicki [name] [name]. 12/15/2021 12:45  PM Medical Record Number: [id number] Patient Account Number: [id number] Date of Birth/Sex: Treating RN: September 12, 1923 (88 y.o. F) Primary Care Provider: Pricilla Holm Other Clinician: Referring Provider: Treating Provider/Extender: Elicia Lamp in Treatment: 8 Information Obtained From Patient Eyes Medical History: Negative  for: Cataracts; Glaucoma; Optic Neuritis Ear/Nose/Mouth/Throat Medical History: Negative for: Chronic sinus problems/congestion; Middle ear problems Past Medical History Notes: hearing aides Hematologic/Lymphatic Vicki [name], Vicki [name] ([id number]) [id number] Page 8 of 9 Medical History: Negative for: Anemia; Hemophilia; Human Immunodeficiency Virus; Lymphedema; Sickle Cell Disease Respiratory Medical History: Negative for: Aspiration; Asthma; Chronic Obstructive Pulmonary Disease (COPD); Pneumothorax; Sleep Apnea; Tuberculosis Cardiovascular Medical History: Positive for: Congestive Heart Failure; Hypertension Negative for: Angina; Arrhythmia; Coronary Artery Disease; Deep Vein Thrombosis; Hypotension; Myocardial Infarction; Peripheral Arterial Disease; Peripheral Venous Disease; Phlebitis; Vasculitis Gastrointestinal Medical History: Negative for: Cirrhosis ; Colitis; Crohns; Hepatitis A; Hepatitis B; Hepatitis C Endocrine Medical History: Negative for: Type I Diabetes; Type II Diabetes Genitourinary Medical History: Past Medical History Notes: Stage three Immunological Medical History: Negative for: Lupus Erythematosus; Raynauds; Scleroderma Integumentary (Skin) Medical History: Negative for: History of Burn Musculoskeletal Medical History: Positive for: Osteoarthritis Negative for: Gout; Rheumatoid Arthritis; Osteomyelitis Neurologic Medical History: Positive for: Neuropathy Negative for: Dementia; Quadriplegia; Paraplegia; Seizure Disorder Psychiatric Medical History: Negative for:  Anorexia/bulimia; Confinement Anxiety Immunizations Pneumococcal Vaccine: Received Pneumococcal Vaccination: Yes Received Pneumococcal Vaccination On or After 60th Birthday: Yes Implantable Devices Yes Hospitalization / Surgery History Type of Hospitalization/Surgery back surgery right hip surgery hysterotomy Family and Social History Cancer: No; Diabetes: Yes - Child; Heart Disease: Yes - Child,Mother,Father,Siblings; Hereditary Spherocytosis: No; Hypertension: Yes - Father,Mother; Kidney Disease: No; Lung Disease: No; Seizures: No; Stroke: No; Thyroid Problems: No; Tuberculosis: No; Never smoker; Marital Status - Widowed; Alcohol Use: Never; Drug Use: No History; Caffeine Use: Rarely; Financial Concerns: No; Food, Clothing or Shelter Needs: No; Support System Lacking: No; Transportation Concerns: No Vicki [name], Vicki [name] ([id number]) [id number] Page 9 of 9 Electronic Signature(s) Signed: 12/15/2021 3:56:07 PM By: Geralyn Corwin DO Entered By: Geralyn Corwin on 12/15/2021 13:28:59 -------------------------------------------------------------------------------- SuperBill Details Patient Name: Date of Service: Vicki [name] [name]. 12/15/2021 Medical Record Number: [id number] Patient Account Number: [id number] Date of Birth/Sex: Treating RN: 07-Aug-1923 (88 y.o. F) Primary Care Provider: Pricilla Holm Other Clinician: Referring Provider: Treating Provider/Extender: Elicia Lamp in Treatment: 8 Diagnosis Coding ICD-10 Codes Code Description [phone] Non-pressure chronic ulcer of other part of right lower leg with fat layer exposed T79.8XXA Other early complications of trauma, initial encounter E03.9 Hypothyroidism, unspecified I87.311 Chronic venous hypertension (idiopathic) with ulcer of right lower extremity Facility Procedures : CPT4 Code: [id number] Description: 11042 - DEB SUBQ TISSUE 20 SQ CM/< ICD-10 Diagnosis  Description L97.812 Non-pressure chronic ulcer of other part of right lower leg with fat layer exp Modifier: osed Quantity: 1 : CPT4 Code: [id number] Description: 11045 - DEB SUBQ TISS EA ADDL 20CM ICD-10 Diagnosis Description L97.812 Non-pressure chronic ulcer of other part of right lower leg with fat layer exp Modifier: osed Quantity: 1 Physician Procedures : CPT4 Code Description Modifier [id number] 11042 - WC PHYS SUBQ TISS 20 SQ CM ICD-10 Diagnosis Description L97.812 Non-pressure chronic ulcer of other part of right lower leg with fat layer exposed Quantity: 1 : [id number] 11045 - WC PHYS SUBQ TISS EA ADDL 20 CM ICD-10 Diagnosis Description L97.812 Non-pressure chronic ulcer of other part of right lower leg with fat layer exposed Quantity: 1 Electronic Signature(s) Signed: 12/15/2021 3:56:07 PM By: Geralyn Corwin DO Entered By: Geralyn Corwin on 12/15/2021 13:30:20

## 2021-12-22 ENCOUNTER — Encounter (HOSPITAL_BASED_OUTPATIENT_CLINIC_OR_DEPARTMENT_OTHER): Payer: Medicare Other | Admitting: Internal Medicine

## 2021-12-22 DIAGNOSIS — L97812 Non-pressure chronic ulcer of other part of right lower leg with fat layer exposed: Secondary | ICD-10-CM

## 2021-12-22 DIAGNOSIS — I87311 Chronic venous hypertension (idiopathic) with ulcer of right lower extremity: Secondary | ICD-10-CM | POA: Diagnosis not present

## 2021-12-25 NOTE — Progress Notes (Signed)
SYERRA, ABDELRAHMAN ([id number]) [id number] Page 1 of 8 Visit Report for 12/22/2021 Arrival Information Details Patient Name: Date of Service: Vicki [name], Vicki [name] 12/22/2021 12:45 PM Medical Record Number: [id number] Patient Account Number: [id number] Date of Birth/Sex: Treating RN: [birth year] (88 y.o. F) Primary Care Vicki [name]: Vicki [name] Other Clinician: Referring Amara Justen: Treating Darol Cush/Extender: [place] Poli in Treatment: 9 Visit Information History Since Last Visit All ordered tests and consults were completed: No Patient Arrived: Vicki [name] Added or deleted any medications: No Arrival Time: 12:46 Any new allergies or adverse reactions: No Accompanied By: son Had a fall or experienced change in No Transfer Assistance: None activities of daily living that may affect Patient Identification Verified: Yes risk of falls: Secondary Verification Process Completed: Yes Signs or symptoms of abuse/neglect since last visito No Patient Requires Transmission-Based Precautions: No Hospitalized since last visit: No Patient Has Alerts: No Implantable device outside of the clinic excluding No cellular tissue based products placed in the center since last visit: Pain Present Now: Yes Electronic Signature(s) Signed: 12/22/2021 1:04:23 PM By: Vicki [name] Entered By: Vicki [name] on 12/22/2021 12:47:02 -------------------------------------------------------------------------------- Encounter Discharge Information Details Patient Name: Date of Service: Vicki Rhymes [name]. 12/22/2021 12:45 PM Medical Record Number: [id number] Patient Account Number: [id number] Date of Birth/Sex: Treating RN: Jul 23, 1923 (88 y.o. Vicki [name], Vicki [name] Primary Care Nycole Kawahara: Vicki [name] Other Clinician: Referring Rhilyn Battle: Treating Barak Bialecki/Extender: [place] Poli in Treatment: 9 Encounter Discharge Information  Items Post Procedure Vitals Discharge Condition: Stable Temperature (F): 98.7 Ambulatory Status: Ambulatory Pulse (bpm): 74 Discharge Destination: Home Respiratory Rate (breaths/min): 17 Transportation: Private Auto Blood Pressure (mmHg): 120/80 Accompanied By: self Schedule Follow-up Appointment: Yes Clinical Summary of Care: Patient Declined Electronic Signature(s) Signed: 12/25/2021 4:44:37 PM By: Rhae Hammock RN Entered By: Rhae Hammock on 12/22/2021 13:23:29 [name] ([id number]) [id number] Page 2 of 8 -------------------------------------------------------------------------------- Lower Extremity Assessment Details Patient Name: Date of Service: Vicki [name], Vicki [name] 12/22/2021 12:45 PM Medical Record Number: [id number] Patient Account Number: [id number] Date of Birth/Sex: Treating RN: Feb 22, 1924 (88 y.o. F) Primary Care Mattison Golay: Vicki [name] Other Clinician: Referring Itzamar Traynor: Treating Alcee Sipos/Extender: [place] Poli in Treatment: 9 Edema Assessment Assessed: [Left: No] [Right: No] Edema: [Left: N] [Right: o] Calf Left: Right: Point of Measurement: 28 cm From Medial Instep 35 cm Ankle Left: Right: Point of Measurement: 12 cm From Medial Instep 21.6 cm Vascular Assessment Pulses: Dorsalis Pedis Palpable: [Right:Yes] Electronic Signature(s) Signed: 12/22/2021 4:47:44 PM By: Erenest Blank Entered By: Erenest Blank on 12/22/2021 13:02:30 -------------------------------------------------------------------------------- Multi Wound Chart Details Patient Name: Date of Service: Vicki Rhymes [name]. 12/22/2021 12:45 PM Medical Record Number: [id number] Patient Account Number: [id number] Date of Birth/Sex: Treating RN: Jun 08, 1923 (88 y.o. F) Primary Care Shemekia Patane: Vicki [name] Other Clinician: Referring Markisha Meding: Treating Rafe Mackowski/Extender: [place] Poli in Treatment: 9 Vital Signs Height(in): 62 Pulse(bpm): 61 Weight(lbs): 142 Blood Pressure(mmHg): 121/78 Body Mass Index(BMI): 26 Temperature(F): 98.0 Respiratory Rate(breaths/min): 20 [1:Photos:] [N/A:N/A] Right, Lateral Lower Leg N/A N/A Wound Location: Trauma N/A N/A Wounding Event: Abrasion N/A N/A Primary Etiology: Lymphedema N/A N/A Secondary Etiology: Congestive Heart Failure, N/A N/A Comorbid History: Hypertension, Osteoarthritis, Neuropathy 08/04/2021 N/A N/A Date Acquired: 9 N/A N/A Weeks of Treatment: Open N/A N/A Wound Status: No N/A N/A Wound Recurrence: 10x2.8x0.1 N/A N/A Measurements L x W x D (cm) 21.991 N/A N/A A (cm) : rea 2.199 N/A N/A Volume (cm) : 58.80% N/A N/A % Reduction in A rea: 91.80%  N/A N/A % Reduction in Volume: Full Thickness Without Exposed N/A N/A Classification: Support Structures Medium N/A N/A Exudate A mount: Serosanguineous N/A N/A Exudate Type: red, brown N/A N/A Exudate Color: Distinct, outline attached N/A N/A Wound Margin: Large (67-100%) N/A N/A Granulation A mount: Red, Pink, Hyper-granulation N/A N/A Granulation Quality: Small (1-33%) N/A N/A Necrotic A mount: Fat Layer (Subcutaneous Tissue): Yes N/A N/A Exposed Structures: Fascia: No Tendon: No Muscle: No Joint: No Bone: No Medium (34-66%) N/A N/A Epithelialization: Debridement - Excisional N/A N/A Debridement: Pre-procedure Verification/Time Out 13:15 N/A N/A Taken: Lidocaine N/A N/A Pain Control: Subcutaneous, Slough N/A N/A Tissue Debrided: Skin/Subcutaneous Tissue N/A N/A Level: 28 N/A N/A Debridement A (sq cm): rea Curette N/A N/A Instrument: Minimum N/A N/A Bleeding: Pressure N/A N/A Hemostasis A chieved: 0 N/A N/A Procedural Pain: 0 N/A N/A Post Procedural Pain: Procedure was tolerated well N/A N/A Debridement Treatment Response: 10x2.8x0.1 N/A N/A Post Debridement Measurements L x W x D (cm) 2.199 N/A  N/A Post Debridement Volume: (cm) Excoriation: No N/A N/A Periwound Skin Texture: Induration: No Callus: No Crepitus: No Rash: No Scarring: No Maceration: No N/A N/A Periwound Skin Moisture: Dry/Scaly: No Hemosiderin Staining: Yes N/A N/A Periwound Skin Color: Atrophie Blanche: No Cyanosis: No Ecchymosis: No Erythema: No Mottled: No Pallor: No Rubor: No No Abnormality N/A N/A Temperature: Debridement N/A N/A Procedures Performed: Treatment Notes Electronic Signature(s) Signed: 12/22/2021 3:32:17 PM By: Kalman Shan DO Entered By: Kalman Shan on 12/22/2021 13:17:48 Vicki Romp [name] ([id number]) [id number] Page 4 of 8 -------------------------------------------------------------------------------- Multi-Disciplinary Care Plan Details Patient Name: Date of Service: Vicki [name], Vicki [name] 12/22/2021 12:45 PM Medical Record Number: [id number] Patient Account Number: [id number] Date of Birth/Sex: Treating RN: 1923-05-30 (88 y.o. Vicki [name], Vicki [name] Primary Care Island Dohmen: Vicki [name] Other Clinician: Referring Johnathyn Viscomi: Treating Arael Piccione/Extender: [place] Poli in Treatment: 9 Active Inactive Nutrition Nursing Diagnoses: Potential for alteratiion in Nutrition/Potential for imbalanced nutrition Goals: Patient/caregiver agrees to and verbalizes understanding of need to obtain nutritional consultation Date Initiated: 10/20/2021 Target Resolution Date: 12/26/2021 Goal Status: Active Interventions: Provide education on nutrition Treatment Activities: Education provided on Nutrition : 11/24/2021 Patient referred to Primary Care Physician for further nutritional evaluation : 10/20/2021 Notes: Pain, Acute or Chronic Nursing Diagnoses: Pain, acute or chronic: actual or potential Potential alteration in comfort, pain Goals: Patient will verbalize adequate pain control and receive pain control interventions  during procedures as needed Date Initiated: 10/20/2021 Target Resolution Date: 12/26/2021 Goal Status: Active Patient/caregiver will verbalize comfort level met Date Initiated: 10/20/2021 Date Inactivated: 11/04/2021 Target Resolution Date: 11/06/2021 Goal Status: Met Interventions: Assess comfort goal upon admission Provide education on pain management Treatment Activities: Administer pain control measures as ordered : 10/20/2021 Notes: Wound/Skin Impairment Nursing Diagnoses: Knowledge deficit related to ulceration/compromised skin integrity Goals: Patient/caregiver will verbalize understanding of skin care regimen Date Initiated: 10/20/2021 Target Resolution Date: 12/26/2021 Goal Status: Active Interventions: Assess patient/caregiver ability to perform ulcer/skin care regimen upon admission and as needed Assess ulceration(s) every visit Provide education on ulcer and skin care Treatment Activities: Skin care regimen initiated : 10/20/2021 Topical wound management initiated : 10/20/2021 Notes: Electronic Signature(s) Vicki [name], Vicki [name] ([id number]) [id number] Page 5 of 8 Signed: 12/25/2021 4:44:37 PM By: Rhae Hammock RN Entered By: Rhae Hammock on 12/22/2021 13:15:39 -------------------------------------------------------------------------------- Pain Assessment Details Patient Name: Date of Service: Vicki Rhymes [name]. 12/22/2021 12:45 PM Medical Record Number: [id number] Patient Account Number: [id number] Date of Birth/Sex: Treating RN: Jul 09, 1923 (88 y.o. F) Primary Care Dawsyn Zurn: Leonides Cave,  Judeen Hammans Other Clinician: Referring Semira Stoltzfus: Treating Mialynn Shelvin/Extender: [place] Poli in Treatment: 9 Active Problems Location of Pain Severity and Description of Pain Patient Has Paino Yes Site Locations Rate the pain. Current Pain Level: 0 Worst Pain Level: 10 Least Pain Level: 0 Tolerable Pain Level: 5 Character  of Pain Describe the Pain: Stabbing Pain Management and Medication Current Pain Management: Electronic Signature(s) Signed: 12/22/2021 1:04:23 PM By: Vicki [name] Entered By: Vicki [name] on 12/22/2021 12:48:17 -------------------------------------------------------------------------------- Patient/Caregiver Education Details Patient Name: Date of Service: Sheliah Hatch 10/23/2023andnbsp12:45 PM Medical Record Number: [id number] Patient Account Number: [id number] Date of Birth/Gender: Treating RN: May 18, 1923 (88 y.o. Vicki [name], Vicki [name] Primary Care Physician: Vicki [name] Other Clinician: Referring Physician: Treating Physician/Extender: [place] Poli in Treatment: [address] [place], [place] [name] ([id number]) [id number] Page 6 of 8 Education Provided To: Patient Education Topics Provided Nutrition: Methods: Explain/Verbal Responses: State content correctly Electronic Signature(s) Signed: 12/25/2021 4:44:37 PM By: Rhae Hammock RN Entered By: Rhae Hammock on 12/22/2021 13:15:54 -------------------------------------------------------------------------------- Wound Assessment Details Patient Name: Date of Service: Vicki Rhymes [name]. 12/22/2021 12:45 PM Medical Record Number: [id number] Patient Account Number: [id number] Date of Birth/Sex: Treating RN: 1924-01-23 (88 y.o. F) Primary Care Tanis Hensarling: Vicki [name] Other Clinician: Referring Arlie Posch: Treating Kirkland Figg/Extender: [place] Poli in Treatment: 9 Wound Status Wound Number: 1 Primary Etiology: Abrasion Wound Location: Right, Lateral Lower Leg Secondary Lymphedema Etiology: Wounding Event: Trauma Wound Status: Open Date Acquired: 08/04/2021 Comorbid Congestive Heart Failure, Hypertension, Osteoarthritis, Weeks Of Treatment: 9 History: Neuropathy Clustered Wound: No Photos Wound  Measurements Length: (cm) 10 Width: (cm) 2.8 Depth: (cm) 0.1 Area: (cm) 21.991 Volume: (cm) 2.199 % Reduction in Area: 58.8% % Reduction in Volume: 91.8% Epithelialization: Medium (34-66%) Tunneling: No Undermining: No Wound Description Classification: Full Thickness Without Exposed Support Structures Wound Margin: Distinct, outline attached Exudate Amount: Medium Exudate Type: Serosanguineous Exudate Color: red, brown Foul Odor After Cleansing: No Slough/Fibrino Yes Wound Bed Granulation Amount: Large (67-100%) Exposed Structure Granulation Quality: Red, Pink, Hyper-granulation Fascia Exposed: No Necrotic Amount: Small (1-33%) Fat Layer (Subcutaneous Tissue) Exposed: Yes Necrotic Quality: Adherent Slough Tendon Exposed: No Muscle Exposed: No Joint Exposed: No PENNELOPE, BASQUE [name] ([id number]) [id number] Page 7 of 8 Bone Exposed: No Periwound Skin Texture Texture Color No Abnormalities Noted: No No Abnormalities Noted: No Callus: No Atrophie Blanche: No Crepitus: No Cyanosis: No Excoriation: No Ecchymosis: No Induration: No Erythema: No Rash: No Hemosiderin Staining: Yes Scarring: No Mottled: No Pallor: No Moisture Rubor: No No Abnormalities Noted: No Dry / Scaly: No Temperature / Pain Maceration: No Temperature: No Abnormality Treatment Notes Wound #1 (Lower Leg) Wound Laterality: Right, Lateral Cleanser Soap and Water Discharge Instruction: May shower and wash wound with dial antibacterial soap and water prior to dressing change. Wound Cleanser Discharge Instruction: Cleanse the wound with wound cleanser prior to applying a clean dressing using gauze sponges, not tissue or cotton balls. Peri-Wound Care Zinc Oxide Ointment 30g tube Discharge Instruction: Apply Zinc Oxide to periwound with each dressing change Sween Lotion (Moisturizing lotion) Discharge Instruction: Apply moisturizing lotion as  directed Topical Gentamicin Discharge Instruction: add directly to wound bed. Primary Dressing PolyMem Silver Non-Adhesive Dressing, 4.25x4.25 in Discharge Instruction: Apply to wound bed as instructed Secondary Dressing Zetuvit Plus 4x8 in Discharge Instruction: Apply over primary dressing as directed. Secured With Compression Wrap Kerlix Roll 4.5x3.1 (in/yd) Discharge Instruction: Apply Kerlix and Coban compression as directed. Coban Self-Adherent Wrap 4x5 (in/yd) Discharge Instruction: Apply over Kerlix  as directed. Compression Stockings Add-Ons Electronic Signature(s) Signed: 12/22/2021 4:47:44 PM By: Erenest Blank Entered By: Erenest Blank on 12/22/2021 13:02:46 -------------------------------------------------------------------------------- Vitals Details Patient Name: Date of Service: Vicki Rhymes [name]. 12/22/2021 12:45 PM Medical Record Number: [id number] Patient Account Number: [id number] Date of Birth/Sex: Treating RN: 03-11-1923 (88 y.o. F) Primary Care Eloyse Causey: Vicki [name] Other Clinician: JAYLEANA, [name] ([id number]) [id number] Page 8 of 8 Referring Aston Lieske: Treating Amiera Herzberg/Extender: [place] Poli in Treatment: 9 Vital Signs Time Taken: 12:45 Temperature (F): 98.0 Height (in): 62 Pulse (bpm): 61 Weight (lbs): 142 Respiratory Rate (breaths/min): 20 Body Mass Index (BMI): 26 Blood Pressure (mmHg): 121/78 Reference Range: 80 - 120 mg / dl Electronic Signature(s) Signed: 12/22/2021 1:04:23 PM By: Vicki [name] Entered By: Vicki [name] on 12/22/2021 12:47:35

## 2021-12-25 NOTE — Progress Notes (Signed)
Vicki, [name] ([id number]) [id number] Page 1 of 9 Visit Report for 12/22/2021 Chief Complaint Document Details Patient Name: Date of Service: Vicki [name], Vicki [name] 12/22/2021 12:45 PM Medical Record Number: [id number] Patient Account Number: [id number] Date of Birth/Sex: Treating RN: [birth year] (88 y.o. F) Primary Care Provider: Pricilla Holm Other Clinician: Referring Provider: Treating Provider/Extender: Elicia Lamp in Treatment: 9 Information Obtained from: Patient Chief Complaint 10/20/2021; right lower extremity wound status post trauma Electronic Signature(s) Signed: 12/22/2021 3:32:17 PM By: Geralyn Corwin DO Entered By: Geralyn Corwin on 12/22/2021 13:17:57 -------------------------------------------------------------------------------- Debridement Details Patient Name: Date of Service: Vicki [name] 12/22/2021 12:45 PM Medical Record Number: [id number] Patient Account Number: [id number] Date of Birth/Sex: Treating RN: Dec 19, 1923 (88 y.o. Ardis Rowan, [name] Primary Care Provider: Pricilla Holm Other Clinician: Referring Provider: Treating Provider/Extender: Elicia Lamp in Treatment: 9 Debridement Performed for Assessment: Wound #1 Right,Lateral Lower Leg Performed By: Physician Geralyn Corwin, DO Debridement Type: Debridement Level of Consciousness (Pre-procedure): Awake and Alert Pre-procedure Verification/Time Out Yes - 13:15 Taken: Start Time: 13:15 Pain Control: Lidocaine T Area Debrided (L x W): otal 10 (cm) x 2.8 (cm) = 28 (cm) Tissue and other material debrided: Viable, Non-Viable, Slough, Subcutaneous, Slough Level: Skin/Subcutaneous Tissue Debridement Description: Excisional Instrument: Curette Bleeding: Minimum Hemostasis Achieved: Pressure End Time: 13:15 Procedural Pain: 0 Post Procedural Pain: 0 Response to Treatment: Procedure was  tolerated well Level of Consciousness (Post- Awake and Alert procedure): Post Debridement Measurements of Total Wound Length: (cm) 10 Width: (cm) 2.8 Depth: (cm) 0.1 Volume: (cm) 2.199 Character of Wound/Ulcer Post Debridement: Improved Post Procedure Diagnosis ZIKERIA, KEOUGH T ([id number]) [id number] Page 2 of 9 Same as Pre-procedure Electronic Signature(s) Signed: 12/22/2021 3:32:17 PM By: Geralyn Corwin DO Signed: 12/25/2021 4:44:37 PM By: Fonnie Mu RN Entered By: Fonnie Mu on 12/22/2021 13:14:57 -------------------------------------------------------------------------------- HPI Details Patient Name: Date of Service: Vicki [name] 12/22/2021 12:45 PM Medical Record Number: [id number] Patient Account Number: [id number] Date of Birth/Sex: Treating RN: 07-17-1923 (88 y.o. F) Primary Care Provider: Pricilla Holm Other Clinician: Referring Provider: Treating Provider/Extender: Elicia Lamp in Treatment: 9 History of Present Illness HPI Description: Admission 10/20/2021 [name] is a 88 year old female with a past medical history of hypothyroidism and essential hypertension that presents to the clinic for a 51-month history of ulcer to the right lower extremity. On 08/15/2021 the patient experienced a mechanical fall and hit her right leg against her table. She developed a hematoma and she was evaluated in the ED for this issue. The hematoma was evacuated in the ED. She had x-rays of the tibia/fibula and ankle that showed no acute osseous abnormalities. She has been using silver alginate to the wound bed. She did have 2 rounds of doxycycline for this issue and recently completed her second course. She currently denies signs of infection. 8/28; patient presents for follow-up. She has no issues or complaints today. We have been doing Santyl and Hydrofera Blue under Kerlix/Coban. She has home  health that they start coming out once weekly. She denies signs of infection. 9/5; patient presents for follow-up. We have been doing Hydrofera Blue with antibiotic ointment under Kerlix/Coban. She has no issues or complaints today. 9/11; patient presents for follow-up. We have been doing Hydrofera Blue under Kerlix/Coban. Home health has been changing the dressing weekly. The wrap was too tight at the more proximal and cutting into the patient's skin. Other than that she has no issues or complaints  today. 9/18; patient presents for follow-up. We have been using PolyMem silver under Kerlix/Coban for the past week. She has no issues or complaints today. 9/25; patient presents for follow-up. We have been using PolyMem silver under Kerlix/Coban. She has no issues or complaints today. 10/2; patient presents for follow-up. We have been using PolyMem silver with antibiotic ointment under compression therapy. Patient has no issues or complaints today. 10/9; patient presents for follow-up. We have been using PolyMem silver under compression therapy. Patient has no issues or complaints today. She reports improvement in wound healing. 10/16; patient presents for follow-up. We have been using PolyMem silver with antibiotic ointment under compression therapy. Patient has no issues or complaints today. 10/23; patient presents for follow-up. We have been using PolyMem silver with antibiotic ointment under Kerlix/Coban. She has no issues or complaints today. Electronic Signature(s) Signed: 12/22/2021 3:32:17 PM By: Geralyn CorwinHoffman, Bobette Leyh DO Entered By: Geralyn CorwinHoffman, Tameca Jerez on 12/22/2021 13:18:16 -------------------------------------------------------------------------------- Physical Exam Details Patient Name: Date of Service: Vicki MaesA LDERMA N, Dejha T. 12/22/2021 12:45 PM Medical Record Number: [id number] Patient Account Number: [id number] Date of Birth/Sex: Treating RN: 1923/04/02 (88 y.o. F) Primary Care Provider:  Pricilla Holmyter-Brown, Sherry Other Clinician: Referring Provider: Treating Provider/Extender: Elicia LampHoffman, Parris Cudworth Ryter-Brown, Sherry Weeks in Treatment: [address] Mctigue, Anniemae T ([id number]) [id number] Page 3 of 9 Constitutional respirations regular, non-labored and within target range for patient.. Cardiovascular 2+ dorsalis pedis/posterior tibialis pulses. Psychiatric pleasant and cooperative. Notes Right lower extremity: T the anterior aspect there is a large open wound with granulation tissue And nonviable tissue. No signs of surrounding infection. o Venous stasis dermatitis. Electronic Signature(s) Signed: 12/22/2021 3:32:17 PM By: Geralyn CorwinHoffman, Ashlynd Michna DO Entered By: Geralyn CorwinHoffman, Chriss Mannan on 12/22/2021 13:19:01 -------------------------------------------------------------------------------- Physician Orders Details Patient Name: Date of Service: Vicki PresumeA LDERMA N, Areta T. 12/22/2021 12:45 PM Medical Record Number: [id number] Patient Account Number: [id number] Date of Birth/Sex: Treating RN: 1923/04/02 (88 y.o. Ardis RowanF) Breedlove, [name] Primary Care Provider: Pricilla Holmyter-Brown, Sherry Other Clinician: Referring Provider: Treating Provider/Extender: Elicia LampHoffman, Margert Edsall Ryter-Brown, Sherry Weeks in Treatment: 9 Verbal / Phone Orders: No Diagnosis Coding Follow-up Appointments ppointment in 1 week. - Dr. Mikey BussingHoffman and Yvonne KendallBobbi, Room 8 Return A Anesthetic (In clinic) Topical Lidocaine 5% applied to wound bed Cellular or Tissue Based Products Wound #1 Right,Lateral Lower Leg Cellular or Tissue Based Product Type: - run insurance authorization for advance tissue products. Bathing/ Shower/ Hygiene May shower with protection but do not get wound dressing(s) wet. Edema Control - Lymphedema / SCD / Other Elevate legs to the level of the heart or above for 30 minutes daily and/or when sitting, a frequency of: - 3-4 times a day throughout the day. Avoid standing for long periods of time. Exercise  regularly Wound Treatment Wound #1 - Lower Leg Wound Laterality: Right, Lateral Cleanser: Soap and Water 1 x Per Week/30 Days Discharge Instructions: May shower and wash wound with dial antibacterial soap and water prior to dressing change. Cleanser: Wound Cleanser 1 x Per Week/30 Days Discharge Instructions: Cleanse the wound with wound cleanser prior to applying a clean dressing using gauze sponges, not tissue or cotton balls. Peri-Wound Care: Zinc Oxide Ointment 30g tube 1 x Per Week/30 Days Discharge Instructions: Apply Zinc Oxide to periwound with each dressing change Peri-Wound Care: Sween Lotion (Moisturizing lotion) 1 x Per Week/30 Days Discharge Instructions: Apply moisturizing lotion as directed Topical: Gentamicin 1 x Per Week/30 Days Discharge Instructions: add directly to wound bed. Prim Dressing: PolyMem Silver Non-Adhesive Dressing, 4.25x4.25 in 1 x Per Week/30 Days ary Discharge Instructions: Apply to  wound bed as instructed Secondary Dressing: Zetuvit Plus 4x8 in 1 x Per Week/30 Days NATORIA, ARCHIBALD ([id number]) [id number] Page 4 of 9 Discharge Instructions: Apply over primary dressing as directed. Compression Wrap: Kerlix Roll 4.5x3.1 (in/yd) (Home Health) 1 x Per Week/30 Days Discharge Instructions: Apply Kerlix and Coban compression as directed. Compression Wrap: Coban Self-Adherent Wrap 4x5 (in/yd) (Home Health) 1 x Per Week/30 Days Discharge Instructions: Apply over Kerlix as directed. Electronic Signature(s) Signed: 12/22/2021 3:32:17 PM By: Kalman Shan DO Entered By: Kalman Shan on 12/22/2021 13:19:08 -------------------------------------------------------------------------------- Problem List Details Patient Name: Date of Service: Seleta Rhymes T. 12/22/2021 12:45 PM Medical Record Number: [id number] Patient Account Number: [id number] Date of Birth/Sex: Treating RN: Aug 23, 1923 (88 y.o. F) Primary Care Provider:  Elenor Quinones Other Clinician: Referring Provider: Treating Provider/Extender: [place] Poli in Treatment: 9 Active Problems ICD-10 Encounter Code Description Active Date MDM Diagnosis L97.812 Non-pressure chronic ulcer of other part of right lower leg with fat layer 10/20/2021 No Yes exposed T79.8XXA Other early complications of trauma, initial encounter 10/20/2021 No Yes E03.9 Hypothyroidism, unspecified 10/20/2021 No Yes I87.311 Chronic venous hypertension (idiopathic) with ulcer of right lower extremity 10/20/2021 No Yes Inactive Problems Resolved Problems Electronic Signature(s) Signed: 12/22/2021 3:32:17 PM By: Kalman Shan DO Entered By: Kalman Shan on 12/22/2021 13:17:43 -------------------------------------------------------------------------------- Progress Note Details Patient Name: Date of Service: Seleta Rhymes T. 12/22/2021 12:45 PM Medical Record Number: [id number] Patient Account Number: [id number] ETTEL, ALBERGO ([id number]) [id number] Page 5 of 9 Date of Birth/Sex: Treating RN: [birth year] (88 y.o. F) Primary Care Provider: Other Clinician: Elenor Quinones Referring Provider: Treating Provider/Extender: [place] Poli in Treatment: 9 Subjective Chief Complaint Information obtained from Patient 10/20/2021; right lower extremity wound status post trauma History of Present Illness (HPI) Admission 10/20/2021 Ms. Mckenna Gamm is a 88 year old female with a past medical history of hypothyroidism and essential hypertension that presents to the clinic for a 50-month history of ulcer to the right lower extremity. On 08/15/2021 the patient experienced a mechanical fall and hit her right leg against her table. She developed a hematoma and she was evaluated in the ED for this issue. The hematoma was evacuated in the ED. She had x-rays of the tibia/fibula and ankle that  showed no acute osseous abnormalities. She has been using silver alginate to the wound bed. She did have 2 rounds of doxycycline for this issue and recently completed her second course. She currently denies signs of infection. 8/28; patient presents for follow-up. She has no issues or complaints today. We have been doing Santyl and Hydrofera Blue under Kerlix/Coban. She has home health that they start coming out once weekly. She denies signs of infection. 9/5; patient presents for follow-up. We have been doing Hydrofera Blue with antibiotic ointment under Kerlix/Coban. She has no issues or complaints today. 9/11; patient presents for follow-up. We have been doing Hydrofera Blue under Kerlix/Coban. Home health has been changing the dressing weekly. The wrap was too tight at the more proximal and cutting into the patient's skin. Other than that she has no issues or complaints today. 9/18; patient presents for follow-up. We have been using PolyMem silver under Kerlix/Coban for the past week. She has no issues or complaints today. 9/25; patient presents for follow-up. We have been using PolyMem silver under Kerlix/Coban. She has no issues or complaints today. 10/2; patient presents for follow-up. We have been using PolyMem silver with antibiotic ointment under compression therapy. Patient has no issues or  complaints today. 10/9; patient presents for follow-up. We have been using PolyMem silver under compression therapy. Patient has no issues or complaints today. She reports improvement in wound healing. 10/16; patient presents for follow-up. We have been using PolyMem silver with antibiotic ointment under compression therapy. Patient has no issues or complaints today. 10/23; patient presents for follow-up. We have been using PolyMem silver with antibiotic ointment under Kerlix/Coban. She has no issues or complaints today. Patient History Information obtained from Patient. Family History Diabetes -  Child, Heart Disease - Child,Mother,Father,Siblings, Hypertension - Father,Mother, No family history of Cancer, Hereditary Spherocytosis, Kidney Disease, Lung Disease, Seizures, Stroke, Thyroid Problems, Tuberculosis. Social History Never smoker, Marital Status - Widowed, Alcohol Use - Never, Drug Use - No History, Caffeine Use - Rarely. Medical History Eyes Denies history of Cataracts, Glaucoma, Optic Neuritis Ear/Nose/Mouth/Throat Denies history of Chronic sinus problems/congestion, Middle ear problems Hematologic/Lymphatic Denies history of Anemia, Hemophilia, Human Immunodeficiency Virus, Lymphedema, Sickle Cell Disease Respiratory Denies history of Aspiration, Asthma, Chronic Obstructive Pulmonary Disease (COPD), Pneumothorax, Sleep Apnea, Tuberculosis Cardiovascular Patient has history of Congestive Heart Failure, Hypertension Denies history of Angina, Arrhythmia, Coronary Artery Disease, Deep Vein Thrombosis, Hypotension, Myocardial Infarction, Peripheral Arterial Disease, Peripheral Venous Disease, Phlebitis, Vasculitis Gastrointestinal Denies history of Cirrhosis , Colitis, Crohnoos, Hepatitis A, Hepatitis B, Hepatitis C Endocrine Denies history of Type I Diabetes, Type II Diabetes Immunological Denies history of Lupus Erythematosus, Raynaudoos, Scleroderma Integumentary (Skin) Denies history of History of Burn Musculoskeletal Patient has history of Osteoarthritis Denies history of Gout, Rheumatoid Arthritis, Osteomyelitis Neurologic Patient has history of Neuropathy Denies history of Dementia, Quadriplegia, Paraplegia, Seizure Disorder Psychiatric Denies history of Anorexia/bulimia, Confinement Anxiety Hospitalization/Surgery History - back surgery. - right hip surgery. - hysterotomy. Medical A Surgical History Notes nd Ear/Nose/Mouth/Throat hearing aides Genitourinary RAMIYA, DELAHUNTY ([id number]) [id number] Page 6 of 9 Stage  three Objective Constitutional respirations regular, non-labored and within target range for patient.. Vitals Time Taken: 12:45 PM, Height: 62 in, Weight: 142 lbs, BMI: 26, Temperature: 98.0 F, Pulse: 61 bpm, Respiratory Rate: 20 breaths/min, Blood Pressure: 121/78 mmHg. Cardiovascular 2+ dorsalis pedis/posterior tibialis pulses. Psychiatric pleasant and cooperative. General Notes: Right lower extremity: T the anterior aspect there is a large open wound with granulation tissue And nonviable tissue. No signs of surrounding o infection. Venous stasis dermatitis. Integumentary (Hair, Skin) Wound #1 status is Open. Original cause of wound was Trauma. The date acquired was: 08/04/2021. The wound has been in treatment 9 weeks. The wound is located on the Right,Lateral Lower Leg. The wound measures 10cm length x 2.8cm width x 0.1cm depth; 21.991cm^2 area and 2.199cm^3 volume. There is Fat Layer (Subcutaneous Tissue) exposed. There is no tunneling or undermining noted. There is a medium amount of serosanguineous drainage noted. The wound margin is distinct with the outline attached to the wound base. There is large (67-100%) red, pink, hyper - granulation within the wound bed. There is a small (1- 33%) amount of necrotic tissue within the wound bed including Adherent Slough. The periwound skin appearance exhibited: Hemosiderin Staining. The periwound skin appearance did not exhibit: Callus, Crepitus, Excoriation, Induration, Rash, Scarring, Dry/Scaly, Maceration, Atrophie Blanche, Cyanosis, Ecchymosis, Mottled, Pallor, Rubor, Erythema. Periwound temperature was noted as No Abnormality. Assessment Active Problems ICD-10 Non-pressure chronic ulcer of other part of right lower leg with fat layer exposed Other early complications of trauma, initial encounter Hypothyroidism, unspecified Chronic venous hypertension (idiopathic) with ulcer of right lower extremity Patient's wound has shown improvement  in size and appearance since last  clinic visit. I debrided nonviable tissue. I recommended continuing the course with antibiotic ointment and PolyMem silver under Kerlix/Coban. Follow-up in 1 week. Procedures Wound #1 Pre-procedure diagnosis of Wound #1 is an Abrasion located on the Right,Lateral Lower Leg . There was a Excisional Skin/Subcutaneous Tissue Debridement with a total area of 28 sq cm performed by Geralyn Corwin, DO. With the following instrument(s): Curette to remove Viable and Non-Viable tissue/material. Material removed includes Subcutaneous Tissue and Slough and after achieving pain control using Lidocaine. No specimens were taken. A time out was conducted at 13:15, prior to the start of the procedure. A Minimum amount of bleeding was controlled with Pressure. The procedure was tolerated well with a pain level of 0 throughout and a pain level of 0 following the procedure. Post Debridement Measurements: 10cm length x 2.8cm width x 0.1cm depth; 2.199cm^3 volume. Character of Wound/Ulcer Post Debridement is improved. Post procedure Diagnosis Wound #1: Same as Pre-Procedure Plan Follow-up Appointments: Return Appointment in 1 week. - Dr. Mikey Bussing and Yvonne Kendall, Room 8 Anesthetic: (In clinic) Topical Lidocaine 5% applied to wound bed Cellular or Tissue Based Products: Wound #1 Right,Lateral Lower Leg: KAYSI, OURADA ([id number]) [id number] Page 7 of 9 Cellular or Tissue Based Product Type: - run insurance authorization for advance tissue products. Bathing/ Shower/ Hygiene: May shower with protection but do not get wound dressing(s) wet. Edema Control - Lymphedema / SCD / Other: Elevate legs to the level of the heart or above for 30 minutes daily and/or when sitting, a frequency of: - 3-4 times a day throughout the day. Avoid standing for long periods of time. Exercise regularly WOUND #1: - Lower Leg Wound Laterality: Right, Lateral Cleanser: Soap and  Water 1 x Per Week/30 Days Discharge Instructions: May shower and wash wound with dial antibacterial soap and water prior to dressing change. Cleanser: Wound Cleanser 1 x Per Week/30 Days Discharge Instructions: Cleanse the wound with wound cleanser prior to applying a clean dressing using gauze sponges, not tissue or cotton balls. Peri-Wound Care: Zinc Oxide Ointment 30g tube 1 x Per Week/30 Days Discharge Instructions: Apply Zinc Oxide to periwound with each dressing change Peri-Wound Care: Sween Lotion (Moisturizing lotion) 1 x Per Week/30 Days Discharge Instructions: Apply moisturizing lotion as directed Topical: Gentamicin 1 x Per Week/30 Days Discharge Instructions: add directly to wound bed. Prim Dressing: PolyMem Silver Non-Adhesive Dressing, 4.25x4.25 in 1 x Per Week/30 Days ary Discharge Instructions: Apply to wound bed as instructed Secondary Dressing: Zetuvit Plus 4x8 in 1 x Per Week/30 Days Discharge Instructions: Apply over primary dressing as directed. Com pression Wrap: Kerlix Roll 4.5x3.1 (in/yd) (Home Health) 1 x Per Week/30 Days Discharge Instructions: Apply Kerlix and Coban compression as directed. Com pression Wrap: Coban Self-Adherent Wrap 4x5 (in/yd) (Home Health) 1 x Per Week/30 Days Discharge Instructions: Apply over Kerlix as directed. 1. In office sharp debridement 2. PolyMem silver and antibiotic ointment under Kerlix/Coban 3. Follow-up in 1 week Electronic Signature(s) Signed: 12/22/2021 3:32:17 PM By: Geralyn Corwin DO Entered By: Geralyn Corwin on 12/22/2021 13:20:12 -------------------------------------------------------------------------------- HxROS Details Patient Name: Date of Service: Vicki [name] 12/22/2021 12:45 PM Medical Record Number: [id number] Patient Account Number: [id number] Date of Birth/Sex: Treating RN: March 02, 1924 (88 y.o. F) Primary Care Provider: Pricilla Holm Other Clinician: Referring Provider: Treating  Provider/Extender: Elicia Lamp in Treatment: 9 Information Obtained From Patient Eyes Medical History: Negative for: Cataracts; Glaucoma; Optic Neuritis Ear/Nose/Mouth/Throat Medical History: Negative for: Chronic sinus problems/congestion; Middle ear problems Past Medical  History Notes: hearing aides Hematologic/Lymphatic Medical History: Negative for: Anemia; Hemophilia; Human Immunodeficiency Virus; Lymphedema; Sickle Cell Disease Respiratory Medical History: Negative for: Aspiration; Asthma; Chronic Obstructive Pulmonary Disease (COPD); Pneumothorax; Sleep Apnea; Tuberculosis BROOKLEN, RUNQUIST ([id number]) [id number] Page 8 of 9 Cardiovascular Medical History: Positive for: Congestive Heart Failure; Hypertension Negative for: Angina; Arrhythmia; Coronary Artery Disease; Deep Vein Thrombosis; Hypotension; Myocardial Infarction; Peripheral Arterial Disease; Peripheral Venous Disease; Phlebitis; Vasculitis Gastrointestinal Medical History: Negative for: Cirrhosis ; Colitis; Crohns; Hepatitis A; Hepatitis B; Hepatitis C Endocrine Medical History: Negative for: Type I Diabetes; Type II Diabetes Genitourinary Medical History: Past Medical History Notes: Stage three Immunological Medical History: Negative for: Lupus Erythematosus; Raynauds; Scleroderma Integumentary (Skin) Medical History: Negative for: History of Burn Musculoskeletal Medical History: Positive for: Osteoarthritis Negative for: Gout; Rheumatoid Arthritis; Osteomyelitis Neurologic Medical History: Positive for: Neuropathy Negative for: Dementia; Quadriplegia; Paraplegia; Seizure Disorder Psychiatric Medical History: Negative for: Anorexia/bulimia; Confinement Anxiety Immunizations Pneumococcal Vaccine: Received Pneumococcal Vaccination: Yes Received Pneumococcal Vaccination On or After 60th Birthday: Yes Implantable  Devices Yes Hospitalization / Surgery History Type of Hospitalization/Surgery back surgery right hip surgery hysterotomy Family and Social History Cancer: No; Diabetes: Yes - Child; Heart Disease: Yes - Child,Mother,Father,Siblings; Hereditary Spherocytosis: No; Hypertension: Yes - Father,Mother; Kidney Disease: No; Lung Disease: No; Seizures: No; Stroke: No; Thyroid Problems: No; Tuberculosis: No; Never smoker; Marital Status - Widowed; Alcohol Use: Never; Drug Use: No History; Caffeine Use: Rarely; Financial Concerns: No; Food, Clothing or Shelter Needs: No; Support System Lacking: No; Transportation Concerns: No Electronic Signature(s) Signed: 12/22/2021 3:32:17 PM By: Geralyn Corwin DO Entered By: Geralyn Corwin on 12/22/2021 13:18:23 Ander Purpura ([id number]) [id number] Page 9 of 9 -------------------------------------------------------------------------------- SuperBill Details Patient Name: Date of Service: ZABRINA, BROTHERTON 12/22/2021 Medical Record Number: [id number] Patient Account Number: [id number] Date of Birth/Sex: Treating RN: 15-Feb-1924 (88 y.o. 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 Modifier: osed Quantity: 1 : CPT4 Code: [id number] Description: 11045 - DEB SUBQ TISS EA ADDL  20CM ICD-10 Diagnosis Description L97.812 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

## 2021-12-29 ENCOUNTER — Encounter (HOSPITAL_BASED_OUTPATIENT_CLINIC_OR_DEPARTMENT_OTHER): Payer: Medicare Other | Admitting: Internal Medicine

## 2021-12-29 DIAGNOSIS — I87311 Chronic venous hypertension (idiopathic) with ulcer of right lower extremity: Secondary | ICD-10-CM | POA: Diagnosis not present

## 2021-12-29 DIAGNOSIS — L97812 Non-pressure chronic ulcer of other part of right lower leg with fat layer exposed: Secondary | ICD-10-CM

## 2022-01-05 ENCOUNTER — Encounter (HOSPITAL_BASED_OUTPATIENT_CLINIC_OR_DEPARTMENT_OTHER): Payer: Medicare Other | Attending: General Surgery | Admitting: General Surgery

## 2022-01-05 DIAGNOSIS — I110 Hypertensive heart disease with heart failure: Secondary | ICD-10-CM | POA: Insufficient documentation

## 2022-01-05 DIAGNOSIS — I878 Other specified disorders of veins: Secondary | ICD-10-CM | POA: Insufficient documentation

## 2022-01-05 DIAGNOSIS — M1990 Unspecified osteoarthritis, unspecified site: Secondary | ICD-10-CM | POA: Diagnosis not present

## 2022-01-05 DIAGNOSIS — T798XXA Other early complications of trauma, initial encounter: Secondary | ICD-10-CM | POA: Diagnosis not present

## 2022-01-05 DIAGNOSIS — W19XXXA Unspecified fall, initial encounter: Secondary | ICD-10-CM | POA: Insufficient documentation

## 2022-01-05 DIAGNOSIS — E039 Hypothyroidism, unspecified: Secondary | ICD-10-CM | POA: Insufficient documentation

## 2022-01-05 DIAGNOSIS — G629 Polyneuropathy, unspecified: Secondary | ICD-10-CM | POA: Insufficient documentation

## 2022-01-05 DIAGNOSIS — L97812 Non-pressure chronic ulcer of other part of right lower leg with fat layer exposed: Secondary | ICD-10-CM | POA: Diagnosis present

## 2022-01-05 DIAGNOSIS — I509 Heart failure, unspecified: Secondary | ICD-10-CM | POA: Insufficient documentation

## 2022-01-05 DIAGNOSIS — I87311 Chronic venous hypertension (idiopathic) with ulcer of right lower extremity: Secondary | ICD-10-CM | POA: Diagnosis not present

## 2022-01-05 NOTE — Progress Notes (Signed)
Vicki [name], Vicki [name] ([id number]) [id number] Page 1 of 1 Visit Report for 01/05/2022 SuperBill Details Patient Name: Date of Service: Vicki [name], Vicki [name] 01/05/2022 Medical Record Number: [id number] Patient Account Number: [id number] Date of Birth/Sex: Treating RN: 09-12-1923 (88 y.o. F) Primary Care Provider: Elenor Quinones Other Clinician: Referring Provider: Treating Provider/Extender: Yvonne Kendall in Treatment: 11 Diagnosis Coding ICD-10 Codes Code Description [phone] Non-pressure chronic ulcer of other part of right lower leg with fat layer exposed T79.8XXA Other early complications of trauma, initial encounter E03.9 Hypothyroidism, unspecified I87.311 Chronic venous hypertension (idiopathic) with ulcer of right lower extremity Facility Procedures CPT4 Code Description Modifier Quantity [id number] - WOUND CARE VISIT-LEV 3 EST PT 1 Electronic Signature(s) Signed: 01/05/2022 2:00:07 PM By: Erenest Blank Signed: 01/05/2022 4:03:30 PM By: Fredirick Maudlin MD FACS Entered By: Erenest Blank on 01/05/2022 13:59:39

## 2022-01-05 NOTE — Progress Notes (Signed)
Vicki [name] ([id number]) [id number] Page 1 of 5 Visit Report for 01/05/2022 Arrival Information Details Patient Name: Date of Service: Vicki [name], Vicki T. 01/05/2022 12:00 PM Medical Record Number: [id number] Patient Account Number: [id number] Date of Birth/Sex: Treating RN: [birth year] (88 y.o. F) Primary Care Aanchal Cope: Pricilla Holm Other Clinician: Referring Gailene Youkhana: Treating Serenna Deroy/Extender: Theressa Stamps in Treatment: 11 Visit Information History Since Last Visit Added or deleted any medications: No Patient Arrived: Walker Any new allergies or adverse reactions: No Arrival Time: 12:45 Had a fall or experienced change in No Accompanied By: daughter activities of daily living that may affect Transfer Assistance: None risk of falls: Patient Identification Verified: Yes Signs or symptoms of abuse/neglect since last visito No Secondary Verification Process Completed: Yes Hospitalized since last visit: No Patient Requires Transmission-Based Precautions: No Implantable device outside of the clinic excluding No Patient Has Alerts: No cellular tissue based products placed in the center since last visit: Has Compression in Place as Prescribed: Yes Pain Present Now: No Electronic Signature(s) Signed: 01/05/2022 2:00:07 PM By: Thayer Dallas Entered By: Thayer Dallas on 01/05/2022 12:45:25 -------------------------------------------------------------------------------- Clinic Level of Care Assessment Details Patient Name: Date of Service: [name], [name] 01/05/2022 12:00 PM Medical Record Number: [id number] Patient Account Number: [id number] Date of Birth/Sex: Treating RN: Nov 09, 1923 (88 y.o. F) Primary Care Rontavious Albright: Pricilla Holm Other Clinician: Referring Asharia Lotter: Treating Amiree No/Extender: Theressa Stamps in Treatment: 11 Clinic Level of Care Assessment Items TOOL 4  Quantity Score X- 1 0 Use when only an EandM is performed on FOLLOW-UP visit ASSESSMENTS - Nursing Assessment / Reassessment X- 1 10 Reassessment of Co-morbidities (includes updates in patient status) X- 1 5 Reassessment of Adherence to Treatment Plan ASSESSMENTS - Wound and Skin A ssessment / Reassessment X - Simple Wound Assessment / Reassessment - one wound 1 5 []  - 0 Complex Wound Assessment / Reassessment - multiple wounds []  - 0 Dermatologic / Skin Assessment (not related to wound area) ASSESSMENTS - Focused Assessment []  - 0 Circumferential Edema Measurements - multi extremities []  - 0 Nutritional Assessment / Counseling / Intervention EMERSYN, [name] T ( ) [id number] Page 2 of 5 []  - 0 Lower Extremity Assessment (monofilament, tuning fork, pulses) []  - 0 Peripheral Arterial Disease Assessment (using hand held doppler) ASSESSMENTS - Ostomy and/or Continence Assessment and Care []  - 0 Incontinence Assessment and Management []  - 0 Ostomy Care Assessment and Management (repouching, etc.) PROCESS - Coordination of Care X - Simple Patient / Family Education for ongoing care 1 15 []  - 0 Complex (extensive) Patient / Family Education for ongoing care []  - 0 Staff obtains , Records, T Results / Process Orders est []  - 0 Staff telephones HHA, Nursing Homes / Clarify orders / etc []  - 0 Routine Transfer to another Facility (non-emergent condition) []  - 0 Routine Hospital Admission (non-emergent condition) []  - 0 New Admissions / Juanell Fairly / Ordering NPWT Apligraf, etc. , []  - 0 Emergency Hospital Admission (emergent condition) X- 1 10 Simple Discharge Coordination []  - 0 Complex (extensive) Discharge Coordination PROCESS - Special Needs []  - 0 Pediatric / Minor Patient Management []  - 0 Isolation Patient Management []  - 0 Hearing / Language / Visual special needs []  - 0 Assessment of Community assistance  (transportation, D/C planning, etc.) []  - 0 Additional assistance / Altered mentation []  - 0 Support Surface(s) Assessment (bed, cushion, seat, etc.) INTERVENTIONS - Wound Cleansing / Measurement X - Simple Wound Cleansing - one wound 1  5 []  - 0 Complex Wound Cleansing - multiple wounds []  - 0 Wound Imaging (photographs - any number of wounds) []  - 0 Wound Tracing (instead of photographs) []  - 0 Simple Wound Measurement - one wound []  - 0 Complex Wound Measurement - multiple wounds INTERVENTIONS - Wound Dressings []  - 0 Small Wound Dressing one or multiple wounds []  - 0 Medium Wound Dressing one or multiple wounds X- 1 20 Large Wound Dressing one or multiple wounds X- 1 5 Application of Medications - topical []  - 0 Application of Medications - injection INTERVENTIONS - Miscellaneous []  - 0 External ear exam []  - 0 Specimen Collection (cultures, biopsies, blood, body fluids, etc.) []  - 0 Specimen(s) / Culture(s) sent or taken to Lab for analysis []  - 0 Patient Transfer (multiple staff / Civil Service fast streamer / Similar devices) []  - 0 Simple Staple / Suture removal (25 or less) []  - 0 Complex Staple / Suture removal (26 or more) []  - 0 Hypo / Hyperglycemic Management (close monitor of Blood Glucose) CALEIGHA, ZALE T ([id number]) [id number] Page 3 of 5 []  - 0 Ankle / Brachial Index (ABI) - do not check if billed separately X- 1 5 Vital Signs Has the patient been seen at the hospital within the last three years: Yes Total Score: 80 Level Of Care: New/Established - Level 3 Electronic Signature(s) Signed: 01/05/2022 2:00:07 PM By: Erenest Blank Entered By: Erenest Blank on 01/05/2022 13:58:47 -------------------------------------------------------------------------------- Encounter Discharge Information Details Patient Name: Date of Service: Vicki [name], Vicki Holler T. 01/05/2022 12:00 PM Medical Record Number: [id number] Patient Account Number:  [id number] Date of Birth/Sex: Treating RN: [birth year] (88 y.o. F) Primary Care Amarion Portell: Elenor Quinones Other Clinician: Erenest Blank Referring Acelyn Basham: Treating Marzell Allemand/Extender: Yvonne Kendall in Treatment: 11 Encounter Discharge Information Items Discharge Condition: Stable Ambulatory Status: Walker Discharge Destination: Home Transportation: Private Auto Accompanied By: daughter Schedule Follow-up Appointment: Yes Clinical Summary of Care: Electronic Signature(s) Signed: 01/05/2022 2:00:07 PM By: Erenest Blank Entered By: Erenest Blank on 01/05/2022 13:59:25 -------------------------------------------------------------------------------- Patient/Caregiver Education Details Patient Name: Date of Service: Sheliah Hatch 11/6/2023andnbsp12:00 PM Medical Record Number: [id number] Patient Account Number: [id number] Date of Birth/Gender: Treating RN: [birth year] (88 y.o. F) Primary Care Physician: Elenor Quinones Other Clinician: Erenest Blank Referring Physician: Treating Physician/Extender: Yvonne Kendall in Treatment: 11 Education Assessment Education Provided To: Patient Education Topics Provided Electronic Signature(s) Signed: 01/05/2022 2:00:07 PM By: Erenest Blank Entered By: Erenest Blank on 01/05/2022 13:59:09 [name] ([id number]) [id number] Page 4 of 5 -------------------------------------------------------------------------------- Wound Assessment Details Patient Name: Date of Service: ROZELL, KETTLEWELL T. 01/05/2022 12:00 PM Medical Record Number: [id number] Patient Account Number: [id number] Date of Birth/Sex: Treating RN: 24-Feb-1924 (88 y.o. F) Primary Care Mardene Lessig: Elenor Quinones Other Clinician: Referring Yerick Eggebrecht: Treating Dosha Broshears/Extender: Yvonne Kendall in Treatment: 11 Wound Status Wound Number: 1 Primary  Etiology: Abrasion Wound Location: Right, Lateral Lower Leg Secondary Etiology: Lymphedema Wounding Event: Trauma Wound Status: Open Date Acquired: 08/04/2021 Weeks Of Treatment: 11 Clustered Wound: No Wound Measurements Length: (cm) 8 Width: (cm) 2 Depth: (cm) 0.1 Area: (cm) 12.566 Volume: (cm) 1.257 % Reduction in Area: 76.5% % Reduction in Volume: 95.3% Wound Description Classification: Full Thickness Without Exposed Suppor Exudate Amount: Medium Exudate Type: Serosanguineous Exudate Color: red, brown t Structures Periwound Skin Texture Texture Color No Abnormalities Noted: No No Abnormalities Noted: No Moisture No Abnormalities Noted: No Treatment Notes Wound #1 (Lower Leg) Wound Laterality: Right, Lateral Cleanser Soap and Water  Discharge Instruction: May shower and wash wound with dial antibacterial soap and water prior to dressing change. Wound Cleanser Discharge Instruction: Cleanse the wound with wound cleanser prior to applying a clean dressing using gauze sponges, not tissue or cotton balls. Peri-Wound Care Zinc Oxide Ointment 30g tube Discharge Instruction: Apply Zinc Oxide to periwound with each dressing change Sween Lotion (Moisturizing lotion) Discharge Instruction: Apply moisturizing lotion as directed Topical Gentamicin Discharge Instruction: add directly to wound bed. Primary Dressing PolyMem Silver Non-Adhesive Dressing, 4.25x4.25 in Discharge Instruction: Apply to wound bed as instructed Secondary Dressing Zetuvit Plus 4x8 in Discharge Instruction: Apply over primary dressing as directed. Secured With Ander Purpura ([id number]) [id number] Page 5 of 5 Compression Wrap Kerlix Roll 4.5x3.1 (in/yd) Discharge Instruction: Apply Kerlix and Coban compression as directed. Coban Self-Adherent Wrap 4x5 (in/yd) Discharge Instruction: Apply over Kerlix as directed. Compression Stockings Add-Ons Electronic  Signature(s) Signed: 01/05/2022 2:00:07 PM By: Thayer Dallas Entered By: Thayer Dallas on 01/05/2022 12:45:55 -------------------------------------------------------------------------------- Vitals Details Patient Name: Date of Service: Gaye Alken, Windell Moulding T. 01/05/2022 12:00 PM Medical Record Number: [id number] Patient Account Number: [id number] Date of Birth/Sex: Treating RN: 07-21-1923 (88 y.o. F) Primary Care Lavoris Sparling: Pricilla Holm Other Clinician: Referring Roanna Reaves: Treating Kendre Sires/Extender: Theressa Stamps in Treatment: 11 Vital Signs Time Taken: 12:45 Temperature (F): 98.3 Height (in): 62 Pulse (bpm): 65 Weight (lbs): 142 Respiratory Rate (breaths/min): 18 Body Mass Index (BMI): 26 Blood Pressure (mmHg): 140/68 Reference Range: 80 - 120 mg / dl Electronic Signature(s) Signed: 01/05/2022 2:00:07 PM By: Thayer Dallas Entered By: Thayer Dallas on 01/05/2022 12:45:46

## 2022-01-06 ENCOUNTER — Encounter (HOSPITAL_BASED_OUTPATIENT_CLINIC_OR_DEPARTMENT_OTHER): Payer: Medicare Other | Admitting: General Surgery

## 2022-01-09 ENCOUNTER — Encounter (HOSPITAL_BASED_OUTPATIENT_CLINIC_OR_DEPARTMENT_OTHER): Payer: Medicare Other | Admitting: Internal Medicine

## 2022-01-12 ENCOUNTER — Encounter (HOSPITAL_BASED_OUTPATIENT_CLINIC_OR_DEPARTMENT_OTHER): Payer: Medicare Other | Admitting: Internal Medicine

## 2022-01-12 DIAGNOSIS — L97812 Non-pressure chronic ulcer of other part of right lower leg with fat layer exposed: Secondary | ICD-10-CM | POA: Diagnosis not present

## 2022-01-12 NOTE — Progress Notes (Signed)
WINNELL, BENTO ([id number]) [id number] Page 1 of 9 Visit Report for 01/12/2022 Chief Complaint Document Details Patient Name: Date of Service: Vicki [name], Vicki [name]. 01/12/2022 12:30 PM Medical Record Number: [id number] Patient Account Number: [id number] Date of Birth/Sex: Treating RN: September 13, 1923 (88 y.o. F) Primary Care Provider: Pricilla Holm Other Clinician: Referring Provider: Treating Provider/Extender: Elicia Lamp in Treatment: 12 Information Obtained from: Patient Chief Complaint 10/20/2021; right lower extremity wound status post trauma Electronic Signature(s) Signed: 01/12/2022 1:13:56 PM By: Geralyn Corwin DO Entered By: Geralyn Corwin on 01/12/2022 13:06:34 -------------------------------------------------------------------------------- Debridement Details Patient Name: Date of Service: Vicki [name]. 01/12/2022 12:30 PM Medical Record Number: [id number] Patient Account Number: [id number] Date of Birth/Sex: Treating RN: [birth year] (88 y.o. Ardis Rowan, [name] Primary Care Provider: Pricilla Holm Other Clinician: Referring Provider: Treating Provider/Extender: Elicia Lamp in Treatment: 12 Debridement Performed for Assessment: Wound #1 Right,Lateral Lower Leg Performed By: Physician Geralyn Corwin, DO Debridement Type: Debridement Level of Consciousness (Pre-procedure): Awake and Alert Pre-procedure Verification/Time Out Yes - 13:00 Taken: Start Time: 13:00 Pain Control: Lidocaine [name] Area Debrided (L x W): otal 8 (cm) x 2.5 (cm) = 20 (cm) Tissue and other material debrided: Viable, Non-Viable, Slough, Subcutaneous, Slough Level: Skin/Subcutaneous Tissue Debridement Description: Excisional Instrument: Curette Bleeding: Minimum Hemostasis Achieved: Pressure End Time: 13:00 Procedural Pain: 0 Post Procedural Pain: 0 Response to Treatment: Procedure was  tolerated well Level of Consciousness (Post- Awake and Alert procedure): Post Debridement Measurements of Total Wound Length: (cm) 8 Width: (cm) 2.5 Depth: (cm) 0.1 Volume: (cm) 1.571 Character of Wound/Ulcer Post Debridement: Improved Post Procedure Diagnosis Vicki [name], Vicki [name] ([id number]) [id number] Page 2 of 9 Same as Pre-procedure Electronic Signature(s) Signed: 01/12/2022 1:13:56 PM By: Geralyn Corwin DO Signed: 01/12/2022 4:10:29 PM By: Fonnie Mu RN Entered By: Fonnie Mu on 01/12/2022 13:02:35 -------------------------------------------------------------------------------- HPI Details Patient Name: Date of Service: Vicki [name]. 01/12/2022 12:30 PM Medical Record Number: [id number] Patient Account Number: [id number] Date of Birth/Sex: Treating RN: 05/21/1923 (88 y.o. F) Primary Care Provider: Pricilla Holm Other Clinician: Referring Provider: Treating Provider/Extender: Elicia Lamp in Treatment: 12 History of Present Illness HPI Description: Admission 10/20/2021 Vicki [name] is a 88 year old female with a past medical history of hypothyroidism and essential hypertension that presents to the clinic for a 66-month history of ulcer to the right lower extremity. On 08/15/2021 the patient experienced a mechanical fall and hit her right leg against her table. She developed a hematoma and she was evaluated in the ED for this issue. The hematoma was evacuated in the ED. She had x-rays of the tibia/fibula and ankle that showed no acute osseous abnormalities. She has been using silver alginate to the wound bed. She did have 2 rounds of doxycycline for this issue and recently completed her second course. She currently denies signs of infection. 8/28; patient presents for follow-up. She has no issues or complaints today. We have been doing Santyl and Hydrofera Blue under Kerlix/Coban. She has home  health that they start coming out once weekly. She denies signs of infection. 9/5; patient presents for follow-up. We have been doing Hydrofera Blue with antibiotic ointment under Kerlix/Coban. She has no issues or complaints today. 9/11; patient presents for follow-up. We have been doing Hydrofera Blue under Kerlix/Coban. Home health has been changing the dressing weekly. The wrap was too tight at the more proximal and cutting into the patient's skin. Other than that she has no issues or complaints  today. 9/18; patient presents for follow-up. We have been using PolyMem silver under Kerlix/Coban for the past week. She has no issues or complaints today. 9/25; patient presents for follow-up. We have been using PolyMem silver under Kerlix/Coban. She has no issues or complaints today. 10/2; patient presents for follow-up. We have been using PolyMem silver with antibiotic ointment under compression therapy. Patient has no issues or complaints today. 10/9; patient presents for follow-up. We have been using PolyMem silver under compression therapy. Patient has no issues or complaints today. She reports improvement in wound healing. 10/16; patient presents for follow-up. We have been using PolyMem silver with antibiotic ointment under compression therapy. Patient has no issues or complaints today. 10/23; patient presents for follow-up. We have been using PolyMem silver with antibiotic ointment under Kerlix/Coban. She has no issues or complaints today. 10/30; patient presents for follow-up. We have been using PolyMem silver with antibiotic ointment under Kerlix/Coban. Her wound continues to show signs of healing. 11/13; patient presents for follow-up. We have been using PolyMem silver with antibiotic ointment under Kerlix/Coban. She has more hyper granulated tissue and nonviable tissue today. She denies signs of infection. Electronic Signature(s) Signed: 01/12/2022 1:13:56 PM By: Geralyn Corwin  DO Entered By: Geralyn Corwin on 01/12/2022 13:07:20 -------------------------------------------------------------------------------- Physical Exam Details Patient Name: Date of Service: Vicki [name]. 01/12/2022 12:30 PM Medical Record Number: [id number] Patient Account Number: [id number] Date of Birth/Sex: Treating RN: 03/24/1923 (88 y.o. Lorre Nick, Mairin [name] ([id number]) [id number] Page 3 of 9 Primary Care Provider: Pricilla Holm Other Clinician: Referring Provider: Treating Provider/Extender: Elicia Lamp in Treatment: 12 Constitutional respirations regular, non-labored and within target range for patient.. Cardiovascular 2+ dorsalis pedis/posterior tibialis pulses. Psychiatric pleasant and cooperative. Notes Right lower extremity: [name] the anterior aspect there is a large open wound with Hyper granulated tissue And nonviable tissue. No signs of surrounding infection. o Venous stasis dermatitis. Good edema control. Electronic Signature(s) Signed: 01/12/2022 1:13:56 PM By: Geralyn Corwin DO Entered By: Geralyn Corwin on 01/12/2022 13:08:38 -------------------------------------------------------------------------------- Physician Orders Details Patient Name: Date of Service: Vicki [name]. 01/12/2022 12:30 PM Medical Record Number: [id number] Patient Account Number: [id number] Date of Birth/Sex: Treating RN: January 20, 1924 (88 y.o. Ardis Rowan, [name] Primary Care Provider: Pricilla Holm Other Clinician: Referring Provider: Treating Provider/Extender: Elicia Lamp in Treatment: 12 Verbal / Phone Orders: No Diagnosis Coding Follow-up Appointments ppointment in 1 week. - w/ Dr. Mikey Bussing (already has appt.) Return A ppointment in 2 weeks. - w/ Dr. Mikey Bussing Return A Anesthetic (In clinic) Topical Lidocaine 5% applied to wound bed Cellular or Tissue Based  Products Wound #1 Right,Lateral Lower Leg Cellular or Tissue Based Product Type: - run insurance authorization for advance tissue products. Bathing/ Shower/ Hygiene May shower with protection but do not get wound dressing(s) wet. Edema Control - Lymphedema / SCD / Other Elevate legs to the level of the heart or above for 30 minutes daily and/or when sitting, a frequency of: - 3-4 times a day throughout the day. Avoid standing for long periods of time. Exercise regularly Wound Treatment Wound #1 - Lower Leg Wound Laterality: Right, Lateral Cleanser: Soap and Water 1 x Per Week/30 Days Discharge Instructions: May shower and wash wound with dial antibacterial soap and water prior to dressing change. Cleanser: Wound Cleanser 1 x Per Week/30 Days Discharge Instructions: Cleanse the wound with wound cleanser prior to applying a clean dressing using gauze sponges, not tissue or cotton balls. Peri-Wound Care: Zinc Oxide  Ointment 30g tube 1 x Per Week/30 Days Discharge Instructions: Apply Zinc Oxide to periwound with each dressing change Peri-Wound Care: Sween Lotion (Moisturizing lotion) 1 x Per Week/30 Days Discharge Instructions: Apply moisturizing lotion as directed Topical: Gentamicin 1 x Per Week/30 Days Vicki [name], Vicki [name] ([id number]) [id number] Page 4 of 9 Discharge Instructions: add directly to wound bed. Topical: Mupirocin Ointment 1 x Per Week/30 Days Discharge Instructions: Apply Mupirocin (Bactroban) as instructed Prim Dressing: Hydrofera Blue Ready Foam, 2.5 x2.5 in 1 x Per Week/30 Days ary Discharge Instructions: Apply to wound bed as instructed Secondary Dressing: Zetuvit Plus 4x8 in 1 x Per Week/30 Days Discharge Instructions: Apply over primary dressing as directed. Compression Wrap: Kerlix Roll 4.5x3.1 (in/yd) (Home Health) 1 x Per Week/30 Days Discharge Instructions: Apply Kerlix and Coban compression as directed. Compression Wrap: Coban  Self-Adherent Wrap 4x5 (in/yd) (Home Health) 1 x Per Week/30 Days Discharge Instructions: Apply over Kerlix as directed. Electronic Signature(s) Signed: 01/12/2022 1:13:56 PM By: Geralyn Corwin DO Entered By: Geralyn Corwin on 01/12/2022 13:08:48 -------------------------------------------------------------------------------- Problem List Details Patient Name: Date of Service: Vicki [name]. 01/12/2022 12:30 PM Medical Record Number: [id number] Patient Account Number: [id number] Date of Birth/Sex: Treating RN: 1923/05/10 (88 y.o. F) Primary Care Provider: Pricilla Holm Other Clinician: Referring Provider: Treating Provider/Extender: Elicia Lamp in Treatment: 12 Active Problems ICD-10 Encounter Code Description Active Date MDM Diagnosis L97.812 Non-pressure chronic ulcer of other part of right lower leg with fat layer 10/20/2021 No Yes exposed T79.8XXA Other early complications of trauma, initial encounter 10/20/2021 No Yes E03.9 Hypothyroidism, unspecified 10/20/2021 No Yes I87.311 Chronic venous hypertension (idiopathic) with ulcer of right lower extremity 10/20/2021 No Yes Inactive Problems Resolved Problems Electronic Signature(s) Signed: 01/12/2022 1:13:56 PM By: Geralyn Corwin DO Entered By: Geralyn Corwin on 01/12/2022 13:06:05 Vicki [name] ([id number]) [id number] Page 5 of 9 -------------------------------------------------------------------------------- Progress Note Details Patient Name: Date of Service: Vicki [name], Vicki [name] 01/12/2022 12:30 PM Medical Record Number: [id number] Patient Account Number: [id number] Date of Birth/Sex: Treating RN: [birth year] (88 y.o. F) Primary Care Provider: Pricilla Holm Other Clinician: Referring Provider: Treating Provider/Extender: Elicia Lamp in Treatment: 12 Subjective Chief Complaint Information obtained from  Patient 10/20/2021; right lower extremity wound status post trauma History of Present Illness (HPI) Admission 10/20/2021 Ms. Alexandrea Westergard is a 88 year old female with a past medical history of hypothyroidism and essential hypertension that presents to the clinic for a 71-month history of ulcer to the right lower extremity. On 08/15/2021 the patient experienced a mechanical fall and hit her right leg against her table. She developed a hematoma and she was evaluated in the ED for this issue. The hematoma was evacuated in the ED. She had x-rays of the tibia/fibula and ankle that showed no acute osseous abnormalities. She has been using silver alginate to the wound bed. She did have 2 rounds of doxycycline for this issue and recently completed her second course. She currently denies signs of infection. 8/28; patient presents for follow-up. She has no issues or complaints today. We have been doing Santyl and Hydrofera Blue under Kerlix/Coban. She has home health that they start coming out once weekly. She denies signs of infection. 9/5; patient presents for follow-up. We have been doing Hydrofera Blue with antibiotic ointment under Kerlix/Coban. She has no issues or complaints today. 9/11; patient presents for follow-up. We have been doing Hydrofera Blue under Kerlix/Coban. Home health has been changing the dressing weekly. The wrap was too tight at  the more proximal and cutting into the patient's skin. Other than that she has no issues or complaints today. 9/18; patient presents for follow-up. We have been using PolyMem silver under Kerlix/Coban for the past week. She has no issues or complaints today. 9/25; patient presents for follow-up. We have been using PolyMem silver under Kerlix/Coban. She has no issues or complaints today. 10/2; patient presents for follow-up. We have been using PolyMem silver with antibiotic ointment under compression therapy. Patient has no issues or complaints today. 10/9;  patient presents for follow-up. We have been using PolyMem silver under compression therapy. Patient has no issues or complaints today. She reports improvement in wound healing. 10/16; patient presents for follow-up. We have been using PolyMem silver with antibiotic ointment under compression therapy. Patient has no issues or complaints today. 10/23; patient presents for follow-up. We have been using PolyMem silver with antibiotic ointment under Kerlix/Coban. She has no issues or complaints today. 10/30; patient presents for follow-up. We have been using PolyMem silver with antibiotic ointment under Kerlix/Coban. Her wound continues to show signs of healing. 11/13; patient presents for follow-up. We have been using PolyMem silver with antibiotic ointment under Kerlix/Coban. She has more hyper granulated tissue and nonviable tissue today. She denies signs of infection. Patient History Information obtained from Patient. Family History Diabetes - Child, Heart Disease - Child,Mother,Father,Siblings, Hypertension - Father,Mother, No family history of Cancer, Hereditary Spherocytosis, Kidney Disease, Lung Disease, Seizures, Stroke, Thyroid Problems, Tuberculosis. Social History Never smoker, Marital Status - Widowed, Alcohol Use - Never, Drug Use - No History, Caffeine Use - Rarely. Medical History Eyes Denies history of Cataracts, Glaucoma, Optic Neuritis Ear/Nose/Mouth/Throat Denies history of Chronic sinus problems/congestion, Middle ear problems Hematologic/Lymphatic Denies history of Anemia, Hemophilia, Human Immunodeficiency Virus, Lymphedema, Sickle Cell Disease Respiratory Denies history of Aspiration, Asthma, Chronic Obstructive Pulmonary Disease (COPD), Pneumothorax, Sleep Apnea, Tuberculosis Cardiovascular Patient has history of Congestive Heart Failure, Hypertension Denies history of Angina, Arrhythmia, Coronary Artery Disease, Deep Vein Thrombosis, Hypotension, Myocardial  Infarction, Peripheral Arterial Disease, Peripheral Venous Disease, Phlebitis, Vasculitis Gastrointestinal Denies history of Cirrhosis , Colitis, Crohnoos, Hepatitis A, Hepatitis B, Hepatitis C Endocrine Denies history of Type I Diabetes, Type II Diabetes Immunological Vicki [name], Vicki [name] ([id number]) [id number] Page 6 of 9 Denies history of Lupus Erythematosus, Raynaudoos, Scleroderma Integumentary (Skin) Denies history of History of Burn Musculoskeletal Patient has history of Osteoarthritis Denies history of Gout, Rheumatoid Arthritis, Osteomyelitis Neurologic Patient has history of Neuropathy Denies history of Dementia, Quadriplegia, Paraplegia, Seizure Disorder Psychiatric Denies history of Anorexia/bulimia, Confinement Anxiety Hospitalization/Surgery History - back surgery. - right hip surgery. - hysterotomy. Medical A Surgical History Notes nd Ear/Nose/Mouth/Throat hearing aides Genitourinary Stage three Objective Constitutional respirations regular, non-labored and within target range for patient.. Vitals Time Taken: 12:50 PM, Height: 62 in, Weight: 142 lbs, BMI: 26, Temperature: 98.5 F, Pulse: 57 bpm, Respiratory Rate: 20 breaths/min, Blood Pressure: 134/75 mmHg. Cardiovascular 2+ dorsalis pedis/posterior tibialis pulses. Psychiatric pleasant and cooperative. General Notes: Right lower extremity: [name] the anterior aspect there is a large open wound with Hyper granulated tissue And nonviable tissue. No signs of o surrounding infection. Venous stasis dermatitis. Good edema control. Integumentary (Hair, Skin) Wound #1 status is Open. Original cause of wound was Trauma. The date acquired was: 08/04/2021. The wound has been in treatment 12 weeks. The wound is located on the Right,Lateral Lower Leg. The wound measures 8cm length x 2.5cm width x 0.1cm depth; 15.708cm^2 area and 1.571cm^3 volume. There is Fat Layer (Subcutaneous Tissue) exposed. There  is no tunneling or undermining noted. There is a medium amount of purulent drainage noted. The wound margin is distinct with the outline attached to the wound base. There is large (67-100%) red, pink, hyper - granulation within the wound bed. There is a small (1-33%) amount of necrotic tissue within the wound bed including Adherent Slough. The periwound skin appearance exhibited: Scarring, Maceration, Hemosiderin Staining. The periwound skin appearance did not exhibit: Callus, Crepitus, Excoriation, Induration, Rash, Dry/Scaly, Atrophie Blanche, Cyanosis, Ecchymosis, Mottled, Pallor, Rubor, Erythema. Assessment Active Problems ICD-10 Non-pressure chronic ulcer of other part of right lower leg with fat layer exposed Other early complications of trauma, initial encounter Hypothyroidism, unspecified Chronic venous hypertension (idiopathic) with ulcer of right lower extremity Patient's wound is stable. I debrided nonviable tissue and used silver nitrate to the hyper granulated areas. I recommended changing the dressing to [facility] and continuing antibiotic ointment and compression therapy. Follow-up in 1 week. Procedures Wound #1 Pre-procedure diagnosis of Wound #1 is an Abrasion located on the Right,Lateral Lower Leg . There was a Excisional Skin/Subcutaneous Tissue Debridement with a total area of 20 sq cm performed by Geralyn Corwin, DO. With the following instrument(s): Curette to remove Viable and Non-Viable tissue/material. Material removed includes Subcutaneous Tissue and Slough and after achieving pain control using Lidocaine. No specimens were taken. A time out was conducted at 13:00, prior to the start of the procedure. A Minimum amount of bleeding was controlled with Pressure. The procedure was tolerated well with a pain level of 0 throughout and a pain level of 0 following the procedure. Post Debridement Measurements: 8cm length x 2.5cm width x 0.1cm depth;  1.571cm^3 volume. Vicki [name], Vicki [name] ([id number]) [id number] Page 7 of 9 Character of Wound/Ulcer Post Debridement is improved. Post procedure Diagnosis Wound #1: Same as Pre-Procedure Plan Follow-up Appointments: Return Appointment in 1 week. - w/ Dr. Mikey Bussing (already has appt.) Return Appointment in 2 weeks. - w/ Dr. Mikey Bussing Anesthetic: (In clinic) Topical Lidocaine 5% applied to wound bed Cellular or Tissue Based Products: Wound #1 Right,Lateral Lower Leg: Cellular or Tissue Based Product Type: - run insurance authorization for advance tissue products. Bathing/ Shower/ Hygiene: May shower with protection but do not get wound dressing(s) wet. Edema Control - Lymphedema / SCD / Other: Elevate legs to the level of the heart or above for 30 minutes daily and/or when sitting, a frequency of: - 3-4 times a day throughout the day. Avoid standing for long periods of time. Exercise regularly WOUND #1: - Lower Leg Wound Laterality: Right, Lateral Cleanser: Soap and Water 1 x Per Week/30 Days Discharge Instructions: May shower and wash wound with dial antibacterial soap and water prior to dressing change. Cleanser: Wound Cleanser 1 x Per Week/30 Days Discharge Instructions: Cleanse the wound with wound cleanser prior to applying a clean dressing using gauze sponges, not tissue or cotton balls. Peri-Wound Care: Zinc Oxide Ointment 30g tube 1 x Per Week/30 Days Discharge Instructions: Apply Zinc Oxide to periwound with each dressing change Peri-Wound Care: Sween Lotion (Moisturizing lotion) 1 x Per Week/30 Days Discharge Instructions: Apply moisturizing lotion as directed Topical: Gentamicin 1 x Per Week/30 Days Discharge Instructions: add directly to wound bed. Topical: Mupirocin Ointment 1 x Per Week/30 Days Discharge Instructions: Apply Mupirocin (Bactroban) as instructed Prim Dressing: Hydrofera Blue Ready Foam, 2.5 x2.5 in 1 x Per Week/30 Days ary Discharge  Instructions: Apply to wound bed as instructed Secondary Dressing: Zetuvit Plus 4x8 in 1 x Per Week/30 Days Discharge Instructions: Apply over  primary dressing as directed. Com pression Wrap: Kerlix Roll 4.5x3.1 (in/yd) (Home Health) 1 x Per Week/30 Days Discharge Instructions: Apply Kerlix and Coban compression as directed. Com pression Wrap: Coban Self-Adherent Wrap 4x5 (in/yd) (Home Health) 1 x Per Week/30 Days Discharge Instructions: Apply over Kerlix as directed. 1. In office sharp debridement 2. Hydrofera Blue with antibiotic ointment under Kerlix/Coban 3. Follow-up in 1 week Electronic Signature(s) Signed: 01/12/2022 1:13:56 PM By: Geralyn Corwin DO Entered By: Geralyn Corwin on 01/12/2022 13:12:44 -------------------------------------------------------------------------------- HxROS Details Patient Name: Date of Service: Vicki [name]. 01/12/2022 12:30 PM Medical Record Number: [id number] Patient Account Number: [id number] Date of Birth/Sex: Treating RN: [birth year] (88 y.o. F) Primary Care Provider: Pricilla Holm Other Clinician: Referring Provider: Treating Provider/Extender: Elicia Lamp in Treatment: 12 Information Obtained From Patient Eyes Medical History: Negative for: Cataracts; Glaucoma; Optic Neuritis Vicki [name], Vicki [name] ([id number]) [id number] Page 8 of 9 Ear/Nose/Mouth/Throat Medical History: Negative for: Chronic sinus problems/congestion; Middle ear problems Past Medical History Notes: hearing aides Hematologic/Lymphatic Medical History: Negative for: Anemia; Hemophilia; Human Immunodeficiency Virus; Lymphedema; Sickle Cell Disease Respiratory Medical History: Negative for: Aspiration; Asthma; Chronic Obstructive Pulmonary Disease (COPD); Pneumothorax; Sleep Apnea; Tuberculosis Cardiovascular Medical History: Positive for: Congestive Heart Failure; Hypertension Negative for: Angina;  Arrhythmia; Coronary Artery Disease; Deep Vein Thrombosis; Hypotension; Myocardial Infarction; Peripheral Arterial Disease; Peripheral Venous Disease; Phlebitis; Vasculitis Gastrointestinal Medical History: Negative for: Cirrhosis ; Colitis; Crohns; Hepatitis A; Hepatitis B; Hepatitis C Endocrine Medical History: Negative for: Type I Diabetes; Type II Diabetes Genitourinary Medical History: Past Medical History Notes: Stage three Immunological Medical History: Negative for: Lupus Erythematosus; Raynauds; Scleroderma Integumentary (Skin) Medical History: Negative for: History of Burn Musculoskeletal Medical History: Positive for: Osteoarthritis Negative for: Gout; Rheumatoid Arthritis; Osteomyelitis Neurologic Medical History: Positive for: Neuropathy Negative for: Dementia; Quadriplegia; Paraplegia; Seizure Disorder Psychiatric Medical History: Negative for: Anorexia/bulimia; Confinement Anxiety Immunizations Pneumococcal Vaccine: Received Pneumococcal Vaccination: Yes Received Pneumococcal Vaccination On or After 60th Birthday: Yes Implantable Devices Yes Hospitalization / Surgery History Type of Hospitalization/Surgery back surgery GRADIE, BUTRICK ([id number]) [id number] Page 9 of 9 right hip surgery hysterotomy Family and Social History Cancer: No; Diabetes: Yes - Child; Heart Disease: Yes - Child,Mother,Father,Siblings; Hereditary Spherocytosis: No; Hypertension: Yes - Father,Mother; Kidney Disease: No; Lung Disease: No; Seizures: No; Stroke: No; Thyroid Problems: No; Tuberculosis: No; Never smoker; Marital Status - Widowed; Alcohol Use: Never; Drug Use: No History; Caffeine Use: Rarely; Financial Concerns: No; Food, Clothing or Shelter Needs: No; Support System Lacking: No; Transportation Concerns: No Electronic Signature(s) Signed: 01/12/2022 1:13:56 PM By: Geralyn Corwin DO Entered By: Geralyn Corwin on 01/12/2022  13:07:28 -------------------------------------------------------------------------------- SuperBill Details Patient Name: Date of Service: Vicki [name]. 01/12/2022 Medical Record Number: [id number] Patient Account Number: [id number] Date of Birth/Sex: Treating RN: [birth year] (88 y.o. Ardis Rowan, [name] Primary Care Provider: Pricilla Holm Other Clinician: Referring Provider: Treating Provider/Extender: Elicia Lamp in Treatment: 12 Diagnosis Coding ICD-10 Codes Code Description [phone] Non-pressure chronic ulcer of other part of right lower leg with fat layer exposed T79.8XXA Other early complications of trauma, initial encounter E03.9 Hypothyroidism, unspecified I87.311 Chronic venous hypertension (idiopathic) with ulcer of right lower extremity Facility Procedures : CPT4 Code: [id number] Description: 11042 - DEB SUBQ TISSUE 20 SQ CM/< ICD-10 Diagnosis Description L97.812 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

## 2022-01-14 NOTE — Progress Notes (Signed)
LENDY, DITTRICH ([id number]) [id number] Page 1 of 8 Visit Report for 01/12/2022 Arrival Information Details Patient Name: Date of Service: Vicki [name], Vicki [name]. 01/12/2022 12:30 PM Medical Record Number: [id number] Patient Account Number: [id number] Date of Birth/Sex: Treating RN: [birth year] (88 y.o. Vicki [name], Meta.Reding Primary Care Vicki [name]: Vicki [name] Other Clinician: Referring Vicki [name]: Treating Vicki [name]/Extender: Vicki [name] in Treatment: 12 Visit Information History Since Last Visit Added or deleted any medications: No Patient Arrived: Walker Any new allergies or adverse reactions: No Arrival Time: 12:42 Had a fall or experienced change in No Accompanied By: daughter in law activities of daily living that may affect Transfer Assistance: None risk of falls: Patient Identification Verified: Yes Signs or symptoms of abuse/neglect since last visito No Secondary Verification Process Completed: Yes Hospitalized since last visit: No Patient Requires Transmission-Based Precautions: No Implantable device outside of the clinic excluding No Patient Has Alerts: No cellular tissue based products placed in the center since last visit: Has Dressing in Place as Prescribed: Yes Has Compression in Place as Prescribed: Yes Pain Present Now: No Electronic Signature(s) Signed: 01/13/2022 5:53:09 PM By: Vicki Pilling RN, BSN Entered By: Vicki [name] on 01/12/2022 12:43:21 -------------------------------------------------------------------------------- Encounter Discharge Information Details Patient Name: Date of Service: Vicki Rhymes T. 01/12/2022 12:30 PM Medical Record Number: [id number] Patient Account Number: [id number] Date of Birth/Sex: Treating RN: October 27, 1923 (88 y.o. Vicki [name], Vicki [name] Primary Care Cinch Ormond: Vicki [name] Other Clinician: Referring Vicki [name]: Treating Vicki [name]/Extender: Vicki [name] in Treatment: 12 Encounter Discharge Information Items Post Procedure Vitals Discharge Condition: Stable Temperature (F): 98.7 Ambulatory Status: Ambulatory Pulse (bpm): 74 Discharge Destination: Home Respiratory Rate (breaths/min): 17 Transportation: Private Auto Blood Pressure (mmHg): 120/80 Accompanied By: daughter Schedule Follow-up Appointment: Yes Clinical Summary of Care: Patient Declined Electronic Signature(s) Signed: 01/12/2022 4:10:29 PM By: Vicki Hammock RN Entered By: Vicki [name] on 01/12/2022 13:08:28 Vicki [name] ([id number]) [id number] Page 2 of 8 -------------------------------------------------------------------------------- Lower Extremity Assessment Details Patient Name: Date of Service: Vicki [name], Vicki [name] 01/12/2022 12:30 PM Medical Record Number: [id number] Patient Account Number: [id number] Date of Birth/Sex: Treating RN: 1923/04/09 (88 y.o. Vicki [name], Vicki [name] Primary Care Penda Venturi: Vicki [name] Other Clinician: Referring Vicki [name]: Treating Vicki [name]/Extender: Vicki [name] in Treatment: 12 Edema Assessment Assessed: Vicki [name]: No] Vicki [name]: Yes] Edema: [Left: N] [Right: o] Calf Left: Right: Point of Measurement: 28 cm From Medial Instep 33 cm Ankle Left: Right: Point of Measurement: 12 cm From Medial Instep 21 cm Vascular Assessment Pulses: Dorsalis Pedis Palpable: [Right:Yes] Electronic Signature(s) Signed: 01/13/2022 5:53:09 PM By: Vicki Pilling RN, BSN Entered By: Vicki [name] on 01/12/2022 12:49:09 -------------------------------------------------------------------------------- Multi Wound Chart Details Patient Name: Date of Service: Vicki Rhymes T. 01/12/2022 12:30 PM Medical Record Number: [id number] Patient Account Number: [id number] Date of Birth/Sex: Treating RN: September 02, 1923 (88 y.o. F) Primary Care Mayfield Schoene: Vicki [name]  Other Clinician: Referring Vicki [name]: Treating Vicki [name]/Extender: Vicki [name] in Treatment: 12 Vital Signs Height(in): 62 Pulse(bpm): 24 Weight(lbs): 142 Blood Pressure(mmHg): 134/75 Body Mass Index(BMI): 26 Temperature(F): 98.5 Respiratory Rate(breaths/min): 20 [1:Photos:] [N/A:N/A] Right, Lateral Lower Leg N/A N/A Wound Location: Trauma N/A N/A Wounding Event: Abrasion N/A N/A Primary Etiology: Lymphedema N/A N/A Secondary Etiology: Congestive Heart Failure, N/A N/A Comorbid History: Hypertension, Osteoarthritis, Neuropathy 08/04/2021 N/A N/A Date Acquired: 12 N/A N/A Weeks of Treatment: Open N/A N/A Wound Status: No N/A N/A Wound Recurrence: 8x2.5x0.1 N/A N/A Measurements L x W x D (cm) 15.708 N/A N/A A (cm) :  rea 1.571 N/A N/A Volume (cm) : 70.60% N/A N/A % Reduction in A rea: 94.10% N/A N/A % Reduction in Volume: Full Thickness Without Exposed N/A N/A Classification: Support Structures Medium N/A N/A Exudate A mount: Purulent N/A N/A Exudate Type: yellow, brown, green N/A N/A Exudate Color: Distinct, outline attached N/A N/A Wound Margin: Large (67-100%) N/A N/A Granulation A mount: Red, Pink, Hyper-granulation N/A N/A Granulation Quality: Small (1-33%) N/A N/A Necrotic A mount: Fat Layer (Subcutaneous Tissue): Yes N/A N/A Exposed Structures: Fascia: No Tendon: No Muscle: No Joint: No Bone: No Debridement - Excisional N/A N/A Debridement: Pre-procedure Verification/Time Out 13:00 N/A N/A Taken: Lidocaine N/A N/A Pain Control: Subcutaneous, Slough N/A N/A Tissue Debrided: Skin/Subcutaneous Tissue N/A N/A Level: 20 N/A N/A Debridement A (sq cm): rea Curette N/A N/A Instrument: Minimum N/A N/A Bleeding: Pressure N/A N/A Hemostasis A chieved: 0 N/A N/A Procedural Pain: 0 N/A N/A Post Procedural Pain: Procedure was tolerated well N/A N/A Debridement Treatment Response: 8x2.5x0.1 N/A N/A Post  Debridement Measurements L x W x D (cm) 1.571 N/A N/A Post Debridement Volume: (cm) Scarring: Yes N/A N/A Periwound Skin Texture: Excoriation: No Induration: No Callus: No Crepitus: No Rash: No Maceration: Yes N/A N/A Periwound Skin Moisture: Dry/Scaly: No Hemosiderin Staining: Yes N/A N/A Periwound Skin Color: Atrophie Blanche: No Cyanosis: No Ecchymosis: No Erythema: No Mottled: No Pallor: No Rubor: No Debridement N/A N/A Procedures Performed: Treatment Notes Electronic Signature(s) Signed: 01/12/2022 1:13:56 PM By: Kalman Shan DO Entered By: Kalman Shan on 01/12/2022 13:06:11 -------------------------------------------------------------------------------- Multi-Disciplinary Care Plan Details Patient Name: Date of Service: Vicki [name], Vicki T. 01/12/2022 12:30 PM Vicki [name] ([id number]) [id number] Page 4 of 8 Medical Record Number: [id number] Patient Account Number: [id number] Date of Birth/Sex: Treating RN: 1923-10-21 (88 y.o. Vicki [name], Vicki [name] Primary Care Tillie Viverette: Vicki [name] Other Clinician: Referring Shekera Beavers: Treating Matalie Romberger/Extender: Vicki [name] in Treatment: 12 Active Inactive Nutrition Nursing Diagnoses: Potential for alteratiion in Nutrition/Potential for imbalanced nutrition Goals: Patient/caregiver agrees to and verbalizes understanding of need to obtain nutritional consultation Date Initiated: 10/20/2021 Target Resolution Date: 01/31/2022 Goal Status: Active Interventions: Provide education on nutrition Treatment Activities: Education provided on Nutrition : 12/22/2021 Patient referred to Primary Care Physician for further nutritional evaluation : 10/20/2021 Notes: Pain, Acute or Chronic Nursing Diagnoses: Pain, acute or chronic: actual or potential Potential alteration in comfort, pain Goals: Patient will verbalize adequate pain control and receive pain  control interventions during procedures as needed Date Initiated: 10/20/2021 Target Resolution Date: 01/31/2022 Goal Status: Active Patient/caregiver will verbalize comfort level met Date Initiated: 10/20/2021 Date Inactivated: 11/04/2021 Target Resolution Date: 11/06/2021 Goal Status: Met Interventions: Assess comfort goal upon admission Provide education on pain management Treatment Activities: Administer pain control measures as ordered : 10/20/2021 Notes: Wound/Skin Impairment Nursing Diagnoses: Knowledge deficit related to ulceration/compromised skin integrity Goals: Patient/caregiver will verbalize understanding of skin care regimen Date Initiated: 10/20/2021 Target Resolution Date: 01/31/2022 Goal Status: Active Interventions: Assess patient/caregiver ability to perform ulcer/skin care regimen upon admission and as needed Assess ulceration(s) every visit Provide education on ulcer and skin care Treatment Activities: Skin care regimen initiated : 10/20/2021 Topical wound management initiated : 10/20/2021 Notes: Electronic Signature(s) Signed: 01/12/2022 4:10:29 PM By: Vicki Hammock RN Entered By: Vicki [name] on 01/12/2022 13:01:35 Vicki [name] ([id number]) [id number] Page 5 of 8 -------------------------------------------------------------------------------- Pain Assessment Details Patient Name: Date of Service: Vicki [name], Vicki [name] 01/12/2022 12:30 PM Medical Record Number: [id number] Patient Account Number: [id number] Date of Birth/Sex: Treating RN: [birth year] (  88 y.o. [name] Primary Care Ginnifer Creelman: Vicki [name] Other Clinician: Referring Othell Diluzio: Treating Lizandro Spellman/Extender: Vicki [name] in Treatment: 12 Active Problems Location of Pain Severity and Description of Pain Patient Has Paino No Site Locations Rate the pain. Current Pain Level: 0 Pain Management and Medication Current Pain  Management: Medication: No Cold Application: No Rest: No Massage: No Activity: No LewisE.N.S.: No Heat Application: No Leg drop or elevation: No Is the Current Pain Management Adequate: Adequate How does your wound impact your activities of daily livingo Sleep: No Bathing: No Appetite: No Relationship With Others: No Bladder Continence: No Emotions: No Bowel Continence: No Work: No Toileting: No Drive: No Dressing: No Hobbies: No Engineer, maintenance) Signed: 01/13/2022 5:53:09 PM By: Vicki Pilling RN, BSN Entered By: Vicki [name] on 01/12/2022 12:43:38 -------------------------------------------------------------------------------- Patient/Caregiver Education Details Patient Name: Date of Service: Vicki [name] 11/13/2023andnbsp12:30 PM Medical Record Number: [id number] Patient Account Number: [id number] Date of Birth/Gender: Treating RN: 1923/07/05 (88 y.o. Vicki [name], Vicki [name] Primary Care Physician: Vicki [name] Other Clinician: Referring Physician: Treating Physician/Extender: Willaim Rayas [place], [place] T ([id number]) [id number] Page 6 of 8 Weeks in Treatment: 12 Education Assessment Education Provided To: Patient Education Topics Provided Nutrition: Methods: Explain/Verbal Responses: Reinforcements needed, State content correctly Pain: Methods: Explain/Verbal Responses: Reinforcements needed, State content correctly Wound/Skin Impairment: Methods: Explain/Verbal Responses: Reinforcements needed, State content correctly Electronic Signature(s) Signed: 01/12/2022 4:10:29 PM By: Vicki Hammock RN Entered By: Vicki [name] on 01/12/2022 13:03:01 -------------------------------------------------------------------------------- Wound Assessment Details Patient Name: Date of Service: Vicki Rhymes T. 01/12/2022 12:30 PM Medical Record Number: [id number] Patient Account Number:  [id number] Date of Birth/Sex: Treating RN: 05/16/1923 (88 y.o. Vicki [name], Vicki [name] Primary Care Zollie Clemence: Vicki [name] Other Clinician: Referring Stephie Xu: Treating Mariya Mottley/Extender: Vicki [name] in Treatment: 12 Wound Status Wound Number: 1 Primary Etiology: Abrasion Wound Location: Right, Lateral Lower Leg Secondary Lymphedema Etiology: Wounding Event: Trauma Wound Status: Open Date Acquired: 08/04/2021 Comorbid Congestive Heart Failure, Hypertension, Osteoarthritis, Weeks Of Treatment: 12 History: Neuropathy Clustered Wound: No Photos Wound Measurements Length: (cm) 8 Width: (cm) 2.5 Depth: (cm) 0.1 Area: (cm) 15.708 Volume: (cm) 1.571 % Reduction in Area: 70.6% % Reduction in Volume: 94.1% Tunneling: No Undermining: No Wound Description Vicki [name], Vicki [name] ([id number]) Classification: Full Thickness Without Exposed Support Structures Wound Margin: Distinct, outline attached Exudate Amount: Medium Exudate Type: Purulent Exudate Color: yellow, brown, green [id number] Page 7 of 8 Foul Odor After Cleansing: No Slough/Fibrino Yes Wound Bed Granulation Amount: Large (67-100%) Exposed Structure Granulation Quality: Red, Pink, Hyper-granulation Fascia Exposed: No Necrotic Amount: Small (1-33%) Fat Layer (Subcutaneous Tissue) Exposed: Yes Necrotic Quality: Adherent Slough Tendon Exposed: No Muscle Exposed: No Joint Exposed: No Bone Exposed: No Periwound Skin Texture Texture Color No Abnormalities Noted: No No Abnormalities Noted: No Callus: No Atrophie Blanche: No Crepitus: No Cyanosis: No Excoriation: No Ecchymosis: No Induration: No Erythema: No Rash: No Hemosiderin Staining: Yes Scarring: Yes Mottled: No Pallor: No Moisture Rubor: No No Abnormalities Noted: No Dry / Scaly: No Maceration: Yes Treatment Notes Wound #1 (Lower Leg) Wound Laterality: Right, Lateral Cleanser Soap and  Water Discharge Instruction: May shower and wash wound with dial antibacterial soap and water prior to dressing change. Wound Cleanser Discharge Instruction: Cleanse the wound with wound cleanser prior to applying a clean dressing using gauze sponges, not tissue or cotton balls. Peri-Wound Care Zinc Oxide Ointment 30g tube Discharge Instruction: Apply Zinc Oxide to periwound with each dressing change Sween Lotion (Moisturizing lotion)  Discharge Instruction: Apply moisturizing lotion as directed Topical Gentamicin Discharge Instruction: add directly to wound bed. Mupirocin Ointment Discharge Instruction: Apply Mupirocin (Bactroban) as instructed Primary Dressing Hydrofera Blue Ready Foam, 2.5 x2.5 in Discharge Instruction: Apply to wound bed as instructed Secondary Dressing Zetuvit Plus 4x8 in Discharge Instruction: Apply over primary dressing as directed. Secured With Compression Wrap Kerlix Roll 4.5x3.1 (in/yd) Discharge Instruction: Apply Kerlix and Coban compression as directed. Coban Self-Adherent Wrap 4x5 (in/yd) Discharge Instruction: Apply over Kerlix as directed. Compression Stockings Add-Ons Electronic Signature(s) Signed: 01/13/2022 5:53:09 PM By: Vicki Pilling RN, BSN Vicki [name], Vicki T ([id number]) PM By: Vicki Pilling RN, BSN [id number].pdf Page 8 of 8 Signed: 01/13/2022 5:53:09 Entered By: Vicki [name] on 01/12/2022 12:50:31 -------------------------------------------------------------------------------- Vitals Details Patient Name: Date of Service: Vicki Rhymes T. 01/12/2022 12:30 PM Medical Record Number: [id number] Patient Account Number: [id number] Date of Birth/Sex: Treating RN: 23-Feb-1924 (88 y.o. Vicki [name], Vicki [name] Primary Care Jovan Schickling: Vicki [name] Other Clinician: Referring Calee Nugent: Treating Olliver Boyadjian/Extender: Vicki [name] in Treatment: 12 Vital Signs Time Taken: 12:50 Temperature (F):  98.5 Height (in): 62 Pulse (bpm): 57 Weight (lbs): 142 Respiratory Rate (breaths/min): 20 Body Mass Index (BMI): 26 Blood Pressure (mmHg): 134/75 Reference Range: 80 - 120 mg / dl Electronic Signature(s) Signed: 01/13/2022 5:53:09 PM By: Vicki Pilling RN, BSN Entered By: Vicki [name] on 01/12/2022 12:52:55

## 2022-01-19 ENCOUNTER — Encounter (HOSPITAL_BASED_OUTPATIENT_CLINIC_OR_DEPARTMENT_OTHER): Payer: Medicare Other | Admitting: Internal Medicine

## 2022-01-19 DIAGNOSIS — L97812 Non-pressure chronic ulcer of other part of right lower leg with fat layer exposed: Secondary | ICD-10-CM | POA: Diagnosis not present

## 2022-01-19 NOTE — Progress Notes (Signed)
CHRISA, HASSAN ([id number]) [id number] Page 1 of 9 Visit Report for 01/19/2022 Chief Complaint Document Details Patient Name: Date of Service: Vicki [name], Vicki [name] 01/19/2022 12:30 PM Medical Record Number: [id number] Patient Account Number: [id number] Date of Birth/Sex: Treating RN: 1923/10/11 (88 y.o. F) Primary Care Provider: Pricilla Holm Other Clinician: Referring Provider: Treating Provider/Extender: Elicia Lamp in Treatment: 13 Information Obtained from: Patient Chief Complaint 10/20/2021; right lower extremity wound status post trauma Electronic Signature(s) Signed: 01/19/2022 1:38:49 PM By: Geralyn Corwin DO Entered By: Geralyn Corwin on 01/19/2022 13:21:42 -------------------------------------------------------------------------------- Debridement Details Patient Name: Date of Service: Vicki [name]. 01/19/2022 12:30 PM Medical Record Number: [id number] Patient Account Number: [id number] Date of Birth/Sex: Treating RN: 1923/12/30 (88 y.o. Ardis Rowan, [name] Primary Care Provider: Pricilla Holm Other Clinician: Referring Provider: Treating Provider/Extender: Elicia Lamp in Treatment: 13 Debridement Performed for Assessment: Wound #1 Right,Lateral Lower Leg Performed By: Physician Geralyn Corwin, DO Debridement Type: Debridement Level of Consciousness (Pre-procedure): Awake and Alert Pre-procedure Verification/Time Out Yes - 12:55 Taken: Start Time: 12:55 Pain Control: Lidocaine [name] Area Debrided (L x W): otal 7 (cm) x 2.5 (cm) = 17.5 (cm) Tissue and other material debrided: Viable, Non-Viable, Slough, Subcutaneous, Slough Level: Skin/Subcutaneous Tissue Debridement Description: Excisional Instrument: Curette Bleeding: Minimum Hemostasis Achieved: Pressure End Time: 12:55 Procedural Pain: 0 Post Procedural Pain: 0 Response to Treatment: Procedure  was tolerated well Level of Consciousness (Post- Awake and Alert procedure): Post Debridement Measurements of Total Wound Length: (cm) 7 Width: (cm) 2.5 Depth: (cm) 0.1 Volume: (cm) 1.374 Character of Wound/Ulcer Post Debridement: Improved Post Procedure Diagnosis Vicki [name], Vicki [name] ([id number]) [id number] Page 2 of 9 Same as Pre-procedure Electronic Signature(s) Signed: 01/19/2022 1:38:49 PM By: Geralyn Corwin DO Signed: 01/19/2022 4:45:06 PM By: Fonnie Mu RN Entered By: Fonnie Mu on 01/19/2022 13:11:05 -------------------------------------------------------------------------------- HPI Details Patient Name: Date of Service: Vicki [name]. 01/19/2022 12:30 PM Medical Record Number: [id number] Patient Account Number: [id number] Date of Birth/Sex: Treating RN: August 30, 1923 (88 y.o. F) Primary Care Provider: Pricilla Holm Other Clinician: Referring Provider: Treating Provider/Extender: Elicia Lamp in Treatment: 13 History of Present Illness HPI Description: Admission 10/20/2021 Vicki [name] is a 88 year old female with a past medical history of hypothyroidism and essential hypertension that presents to the clinic for a 70-month history of ulcer to the right lower extremity. On 08/15/2021 the patient experienced a mechanical fall and hit her right leg against her table. She developed a hematoma and she was evaluated in the ED for this issue. The hematoma was evacuated in the ED. She had x-rays of the tibia/fibula and ankle that showed no acute osseous abnormalities. She has been using silver alginate to the wound bed. She did have 2 rounds of doxycycline for this issue and recently completed her second course. She currently denies signs of infection. 8/28; patient presents for follow-up. She has no issues or complaints today. We have been doing Santyl and Hydrofera Blue under Kerlix/Coban. She  has home health that they start coming out once weekly. She denies signs of infection. 9/5; patient presents for follow-up. We have been doing Hydrofera Blue with antibiotic ointment under Kerlix/Coban. She has no issues or complaints today. 9/11; patient presents for follow-up. We have been doing Hydrofera Blue under Kerlix/Coban. Home health has been changing the dressing weekly. The wrap was too tight at the more proximal and cutting into the patient's skin. Other than that she has no issues or complaints  today. 9/18; patient presents for follow-up. We have been using PolyMem silver under Kerlix/Coban for the past week. She has no issues or complaints today. 9/25; patient presents for follow-up. We have been using PolyMem silver under Kerlix/Coban. She has no issues or complaints today. 10/2; patient presents for follow-up. We have been using PolyMem silver with antibiotic ointment under compression therapy. Patient has no issues or complaints today. 10/9; patient presents for follow-up. We have been using PolyMem silver under compression therapy. Patient has no issues or complaints today. She reports improvement in wound healing. 10/16; patient presents for follow-up. We have been using PolyMem silver with antibiotic ointment under compression therapy. Patient has no issues or complaints today. 10/23; patient presents for follow-up. We have been using PolyMem silver with antibiotic ointment under Kerlix/Coban. She has no issues or complaints today. 10/30; patient presents for follow-up. We have been using PolyMem silver with antibiotic ointment under Kerlix/Coban. Her wound continues to show signs of healing. 11/13; patient presents for follow-up. We have been using PolyMem silver with antibiotic ointment under Kerlix/Coban. She has more hyper granulated tissue and nonviable tissue today. She denies signs of infection. 11/20; patient presents for follow-up. We have been using Hydrofera Blue  with antibiotic ointment under compression therapy. Patient states she is tolerating this well. She denies signs of infection. Electronic Signature(s) Signed: 01/19/2022 1:38:49 PM By: Geralyn Corwin DO Entered By: Geralyn Corwin on 01/19/2022 13:22:22 Physical Exam Details -------------------------------------------------------------------------------- Vicki [name] ([id number]) [id number] Page 3 of 9 Patient Name: Date of Service: Vicki [name], Vicki [name] 01/19/2022 12:30 PM Medical Record Number: [id number] Patient Account Number: [id number] Date of Birth/Sex: Treating RN: Mar 31, 1923 (88 y.o. F) Primary Care Provider: Pricilla Holm Other Clinician: Referring Provider: Treating Provider/Extender: Elicia Lamp in Treatment: 13 Constitutional respirations regular, non-labored and within target range for patient.. Cardiovascular 2+ dorsalis pedis/posterior tibialis pulses. Psychiatric pleasant and cooperative. Notes Right lower extremity: [name] the anterior aspect there is an open wound with granulation tissue and Slough. No surrounding signs of infection. Venous stasis o dermatitis. Good edema control. Electronic Signature(s) Signed: 01/19/2022 1:38:49 PM By: Geralyn Corwin DO Entered By: Geralyn Corwin on 01/19/2022 13:23:05 -------------------------------------------------------------------------------- Physician Orders Details Patient Name: Date of Service: Vicki [name]. 01/19/2022 12:30 PM Medical Record Number: [id number] Patient Account Number: [id number] Date of Birth/Sex: Treating RN: 09/11/1923 (88 y.o. Ardis Rowan, [name] Primary Care Provider: Pricilla Holm Other Clinician: Referring Provider: Treating Provider/Extender: Elicia Lamp in Treatment: 47 Verbal / Phone Orders: No Diagnosis Coding Follow-up Appointments ppointment in 1 week. - w/ Dr. Mikey Bussing  (already has appt.) Return A Anesthetic (In clinic) Topical Lidocaine 5% applied to wound bed Cellular or Tissue Based Products Wound #1 Right,Lateral Lower Leg Cellular or Tissue Based Product Type: - run insurance authorization for advance tissue products. Bathing/ Shower/ Hygiene May shower with protection but do not get wound dressing(s) wet. Edema Control - Lymphedema / SCD / Other Elevate legs to the level of the heart or above for 30 minutes daily and/or when sitting, a frequency of: - 3-4 times a day throughout the day. Avoid standing for long periods of time. Exercise regularly Wound Treatment Wound #1 - Lower Leg Wound Laterality: Right, Lateral Cleanser: Soap and Water 1 x Per Week/30 Days Discharge Instructions: May shower and wash wound with dial antibacterial soap and water prior to dressing change. Cleanser: Wound Cleanser 1 x Per Week/30 Days Discharge Instructions: Cleanse the wound with wound cleanser prior to  applying a clean dressing using gauze sponges, not tissue or cotton balls. Peri-Wound Care: Zinc Oxide Ointment 30g tube 1 x Per Week/30 Days Discharge Instructions: Apply Zinc Oxide to periwound with each dressing change Peri-Wound Care: Sween Lotion (Moisturizing lotion) 1 x Per Week/30 Days Discharge Instructions: Apply moisturizing lotion as directed MOLLYE, GUINTA ([id number]) [id number] Page 4 of 9 Topical: Gentamicin 1 x Per Week/30 Days Discharge Instructions: add directly to wound bed. Topical: Mupirocin Ointment 1 x Per Week/30 Days Discharge Instructions: Apply Mupirocin (Bactroban) as instructed Prim Dressing: Hydrofera Blue Ready Foam, 2.5 x2.5 in 1 x Per Week/30 Days ary Discharge Instructions: Apply to wound bed as instructed Secondary Dressing: Zetuvit Plus 4x8 in 1 x Per Week/30 Days Discharge Instructions: Apply over primary dressing as directed. Compression Wrap: Kerlix Roll 4.5x3.1 (in/yd) (Home Health) 1 x Per  Week/30 Days Discharge Instructions: Apply Kerlix and Coban compression as directed. Compression Wrap: Coban Self-Adherent Wrap 4x5 (in/yd) (Home Health) 1 x Per Week/30 Days Discharge Instructions: Apply over Kerlix as directed. Electronic Signature(s) Signed: 01/19/2022 1:38:49 PM By: Geralyn Corwin DO Entered By: Geralyn Corwin on 01/19/2022 13:23:17 -------------------------------------------------------------------------------- Problem List Details Patient Name: Date of Service: Vicki [name]. 01/19/2022 12:30 PM Medical Record Number: [id number] Patient Account Number: [id number] Date of Birth/Sex: Treating RN: [birth year] (88 y.o. F) Primary Care Provider: Pricilla Holm Other Clinician: Referring Provider: Treating Provider/Extender: Elicia Lamp in Treatment: 13 Active Problems ICD-10 Encounter Code Description Active Date MDM Diagnosis L97.812 Non-pressure chronic ulcer of other part of right lower leg with fat layer 10/20/2021 No Yes exposed T79.8XXA Other early complications of trauma, initial encounter 10/20/2021 No Yes E03.9 Hypothyroidism, unspecified 10/20/2021 No Yes I87.311 Chronic venous hypertension (idiopathic) with ulcer of right lower extremity 10/20/2021 No Yes Inactive Problems Resolved Problems Electronic Signature(s) Signed: 01/19/2022 1:38:49 PM By: Geralyn Corwin DO Entered By: Geralyn Corwin on 01/19/2022 13:20:34 Vicki [name] ([id number]) [id number] Page 5 of 9 -------------------------------------------------------------------------------- Progress Note Details Patient Name: Date of Service: Vicki [name], Vicki [name] 01/19/2022 12:30 PM Medical Record Number: [id number] Patient Account Number: [id number] Date of Birth/Sex: Treating RN: Jan 05, 1924 (88 y.o. F) Primary Care Provider: Pricilla Holm Other Clinician: Referring Provider: Treating Provider/Extender: Elicia Lamp in Treatment: 13 Subjective Chief Complaint Information obtained from Patient 10/20/2021; right lower extremity wound status post trauma History of Present Illness (HPI) Admission 10/20/2021 Ms. Lakeva Hollon is a 88 year old female with a past medical history of hypothyroidism and essential hypertension that presents to the clinic for a 45-month history of ulcer to the right lower extremity. On 08/15/2021 the patient experienced a mechanical fall and hit her right leg against her table. She developed a hematoma and she was evaluated in the ED for this issue. The hematoma was evacuated in the ED. She had x-rays of the tibia/fibula and ankle that showed no acute osseous abnormalities. She has been using silver alginate to the wound bed. She did have 2 rounds of doxycycline for this issue and recently completed her second course. She currently denies signs of infection. 8/28; patient presents for follow-up. She has no issues or complaints today. We have been doing Santyl and Hydrofera Blue under Kerlix/Coban. She has home health that they start coming out once weekly. She denies signs of infection. 9/5; patient presents for follow-up. We have been doing Hydrofera Blue with antibiotic ointment under Kerlix/Coban. She has no issues or complaints today. 9/11; patient presents for follow-up. We have been doing [employer]	[place]  under Kerlix/Coban. Home health has been changing the dressing weekly. The wrap was too tight at the more proximal and cutting into the patient's skin. Other than that she has no issues or complaints today. 9/18; patient presents for follow-up. We have been using PolyMem silver under Kerlix/Coban for the past week. She has no issues or complaints today. 9/25; patient presents for follow-up. We have been using PolyMem silver under Kerlix/Coban. She has no issues or complaints today. 10/2; patient presents for follow-up. We have been using PolyMem  silver with antibiotic ointment under compression therapy. Patient has no issues or complaints today. 10/9; patient presents for follow-up. We have been using PolyMem silver under compression therapy. Patient has no issues or complaints today. She reports improvement in wound healing. 10/16; patient presents for follow-up. We have been using PolyMem silver with antibiotic ointment under compression therapy. Patient has no issues or complaints today. 10/23; patient presents for follow-up. We have been using PolyMem silver with antibiotic ointment under Kerlix/Coban. She has no issues or complaints today. 10/30; patient presents for follow-up. We have been using PolyMem silver with antibiotic ointment under Kerlix/Coban. Her wound continues to show signs of healing. 11/13; patient presents for follow-up. We have been using PolyMem silver with antibiotic ointment under Kerlix/Coban. She has more hyper granulated tissue and nonviable tissue today. She denies signs of infection. 11/20; patient presents for follow-up. We have been using Hydrofera Blue with antibiotic ointment under compression therapy. Patient states she is tolerating this well. She denies signs of infection. Patient History Information obtained from Patient. Family History Diabetes - Child, Heart Disease - Child,Mother,Father,Siblings, Hypertension - Father,Mother, No family history of Cancer, Hereditary Spherocytosis, Kidney Disease, Lung Disease, Seizures, Stroke, Thyroid Problems, Tuberculosis. Social History Never smoker, Marital Status - Widowed, Alcohol Use - Never, Drug Use - No History, Caffeine Use - Rarely. Medical History Eyes Denies history of Cataracts, Glaucoma, Optic Neuritis Ear/Nose/Mouth/Throat Denies history of Chronic sinus problems/congestion, Middle ear problems Hematologic/Lymphatic Denies history of Anemia, Hemophilia, Human Immunodeficiency Virus, Lymphedema, Sickle Cell Disease Respiratory Denies  history of Aspiration, Asthma, Chronic Obstructive Pulmonary Disease (COPD), Pneumothorax, Sleep Apnea, Tuberculosis Cardiovascular Patient has history of Congestive Heart Failure, Hypertension Denies history of Angina, Arrhythmia, Coronary Artery Disease, Deep Vein Thrombosis, Hypotension, Myocardial Infarction, Peripheral Arterial Disease, Peripheral Venous Disease, Phlebitis, Vasculitis Gastrointestinal Vicki [name], Vicki [name] ([id number]) [id number] Page 6 of 9 Denies history of Cirrhosis , Colitis, Crohnoos, Hepatitis A, Hepatitis B, Hepatitis C Endocrine Denies history of Type I Diabetes, Type II Diabetes Immunological Denies history of Lupus Erythematosus, Raynaudoos, Scleroderma Integumentary (Skin) Denies history of History of Burn Musculoskeletal Patient has history of Osteoarthritis Denies history of Gout, Rheumatoid Arthritis, Osteomyelitis Neurologic Patient has history of Neuropathy Denies history of Dementia, Quadriplegia, Paraplegia, Seizure Disorder Psychiatric Denies history of Anorexia/bulimia, Confinement Anxiety Hospitalization/Surgery History - back surgery. - right hip surgery. - hysterotomy. Medical A Surgical History Notes nd Ear/Nose/Mouth/Throat hearing aides Genitourinary Stage three Objective Constitutional respirations regular, non-labored and within target range for patient.. Vitals Time Taken: 12:50 PM, Height: 62 in, Weight: 142 lbs, BMI: 26, Temperature: 98.1 F, Pulse: 64 bpm, Respiratory Rate: 20 breaths/min, Blood Pressure: 156/68 mmHg. Cardiovascular 2+ dorsalis pedis/posterior tibialis pulses. Psychiatric pleasant and cooperative. General Notes: Right lower extremity: [name] the anterior aspect there is an open wound with granulation tissue and Slough. No surrounding signs of infection. o Venous stasis dermatitis. Good edema control. Integumentary (Hair, Skin) Wound #1 status is Open. Original cause of wound was  Trauma. The date acquired  was: 08/04/2021. The wound has been in treatment 13 weeks. The wound is located on the Right,Lateral Lower Leg. The wound measures 7cm length x 2.5cm width x 0.1cm depth; 13.744cm^2 area and 1.374cm^3 volume. There is Fat Layer (Subcutaneous Tissue) exposed. There is no tunneling or undermining noted. There is a medium amount of serosanguineous drainage noted. The wound margin is distinct with the outline attached to the wound base. There is medium (34-66%) red, pink, hyper - granulation within the wound bed. There is a small (1- 33%) amount of necrotic tissue within the wound bed including Adherent Slough. The periwound skin appearance exhibited: Scarring, Hemosiderin Staining. The periwound skin appearance did not exhibit: Callus, Crepitus, Excoriation, Induration, Rash, Dry/Scaly, Maceration, Atrophie Blanche, Cyanosis, Ecchymosis, Mottled, Pallor, Rubor, Erythema. Assessment Active Problems ICD-10 Non-pressure chronic ulcer of other part of right lower leg with fat layer exposed Other early complications of trauma, initial encounter Hypothyroidism, unspecified Chronic venous hypertension (idiopathic) with ulcer of right lower extremity Patient's wound has shown improvement in size appearance since last clinic visit. I debrided nonviable tissue. I recommended continuing the course with Hydrofera Blue and antibiotic ointment under compression therapy. Follow-up in 1 week. Procedures Wound #1 Pre-procedure diagnosis of Wound #1 is an Abrasion located on the Right,Lateral Lower Leg . There was a Excisional Skin/Subcutaneous Tissue Debridement FREDDI, FORSTER ([id number]) [id number] Page 7 of 9 with a total area of 17.5 sq cm performed by Geralyn Corwin, DO. With the following instrument(s): Curette to remove Viable and Non-Viable tissue/material. Material removed includes Subcutaneous Tissue and Slough and after achieving pain control using  Lidocaine. No specimens were taken. A time out was conducted at 12:55, prior to the start of the procedure. A Minimum amount of bleeding was controlled with Pressure. The procedure was tolerated well with a pain level of 0 throughout and a pain level of 0 following the procedure. Post Debridement Measurements: 7cm length x 2.5cm width x 0.1cm depth; 1.374cm^3 volume. Character of Wound/Ulcer Post Debridement is improved. Post procedure Diagnosis Wound #1: Same as Pre-Procedure Plan Follow-up Appointments: Return Appointment in 1 week. - w/ Dr. Mikey Bussing (already has appt.) Anesthetic: (In clinic) Topical Lidocaine 5% applied to wound bed Cellular or Tissue Based Products: Wound #1 Right,Lateral Lower Leg: Cellular or Tissue Based Product Type: - run insurance authorization for advance tissue products. Bathing/ Shower/ Hygiene: May shower with protection but do not get wound dressing(s) wet. Edema Control - Lymphedema / SCD / Other: Elevate legs to the level of the heart or above for 30 minutes daily and/or when sitting, a frequency of: - 3-4 times a day throughout the day. Avoid standing for long periods of time. Exercise regularly WOUND #1: - Lower Leg Wound Laterality: Right, Lateral Cleanser: Soap and Water 1 x Per Week/30 Days Discharge Instructions: May shower and wash wound with dial antibacterial soap and water prior to dressing change. Cleanser: Wound Cleanser 1 x Per Week/30 Days Discharge Instructions: Cleanse the wound with wound cleanser prior to applying a clean dressing using gauze sponges, not tissue or cotton balls. Peri-Wound Care: Zinc Oxide Ointment 30g tube 1 x Per Week/30 Days Discharge Instructions: Apply Zinc Oxide to periwound with each dressing change Peri-Wound Care: Sween Lotion (Moisturizing lotion) 1 x Per Week/30 Days Discharge Instructions: Apply moisturizing lotion as directed Topical: Gentamicin 1 x Per Week/30 Days Discharge Instructions: add directly to  wound bed. Topical: Mupirocin Ointment 1 x Per Week/30 Days Discharge Instructions: Apply Mupirocin (Bactroban) as instructed Prim Dressing: Hydrofera Blue Ready  Foam, 2.5 x2.5 in 1 x Per Week/30 Days ary Discharge Instructions: Apply to wound bed as instructed Secondary Dressing: Zetuvit Plus 4x8 in 1 x Per Week/30 Days Discharge Instructions: Apply over primary dressing as directed. Com pression Wrap: Kerlix Roll 4.5x3.1 (in/yd) (Home Health) 1 x Per Week/30 Days Discharge Instructions: Apply Kerlix and Coban compression as directed. Com pression Wrap: Coban Self-Adherent Wrap 4x5 (in/yd) (Home Health) 1 x Per Week/30 Days Discharge Instructions: Apply over Kerlix as directed. 1. In office sharp debridement 2. Hydrofera Blue and antibiotic ointment under Kerlix/Coban 3. Follow-up in 1 week Electronic Signature(s) Signed: 01/19/2022 1:38:49 PM By: Geralyn CorwinHoffman, Asal Teas DO Entered By: Geralyn CorwinHoffman, Derriona Branscom on 01/19/2022 13:24:20 -------------------------------------------------------------------------------- HxROS Details Patient Name: Date of Service: Vicki [name], Vicki [name]. 01/19/2022 12:30 PM Medical Record Number: [id number] Patient Account Number: [id number] Date of Birth/Sex: Treating RN: 09-Jul-1923 (88 y.o. F) Primary Care Provider: Pricilla Holmyter-Brown, Sherry Other Clinician: Referring Provider: Treating Provider/Extender: Elicia LampHoffman, Elecia Serafin Ryter-Brown, Sherry Weeks in Treatment: [address] Information Obtained From Patient Eyes Vicki PurpuraLDERMAN, Vicki [name] ([id number]) [id number] Page 8 of 9 Medical History: Negative for: Cataracts; Glaucoma; Optic Neuritis Ear/Nose/Mouth/Throat Medical History: Negative for: Chronic sinus problems/congestion; Middle ear problems Past Medical History Notes: hearing aides Hematologic/Lymphatic Medical History: Negative for: Anemia; Hemophilia; Human Immunodeficiency Virus; Lymphedema; Sickle Cell Disease Respiratory Medical History: Negative  for: Aspiration; Asthma; Chronic Obstructive Pulmonary Disease (COPD); Pneumothorax; Sleep Apnea; Tuberculosis Cardiovascular Medical History: Positive for: Congestive Heart Failure; Hypertension Negative for: Angina; Arrhythmia; Coronary Artery Disease; Deep Vein Thrombosis; Hypotension; Myocardial Infarction; Peripheral Arterial Disease; Peripheral Venous Disease; Phlebitis; Vasculitis Gastrointestinal Medical History: Negative for: Cirrhosis ; Colitis; Crohns; Hepatitis A; Hepatitis B; Hepatitis C Endocrine Medical History: Negative for: Type I Diabetes; Type II Diabetes Genitourinary Medical History: Past Medical History Notes: Stage three Immunological Medical History: Negative for: Lupus Erythematosus; Raynauds; Scleroderma Integumentary (Skin) Medical History: Negative for: History of Burn Musculoskeletal Medical History: Positive for: Osteoarthritis Negative for: Gout; Rheumatoid Arthritis; Osteomyelitis Neurologic Medical History: Positive for: Neuropathy Negative for: Dementia; Quadriplegia; Paraplegia; Seizure Disorder Psychiatric Medical History: Negative for: Anorexia/bulimia; Confinement Anxiety Immunizations Pneumococcal Vaccine: Received Pneumococcal Vaccination: Yes Received Pneumococcal Vaccination On or After 60th Birthday: Yes Implantable Devices Yes Vicki PurpuraLDERMAN, Vicki [name] ([id number]) [id number] Page 9 of 9 Hospitalization / Surgery History Type of Hospitalization/Surgery back surgery right hip surgery hysterotomy Family and Social History Cancer: No; Diabetes: Yes - Child; Heart Disease: Yes - Child,Mother,Father,Siblings; Hereditary Spherocytosis: No; Hypertension: Yes - Father,Mother; Kidney Disease: No; Lung Disease: No; Seizures: No; Stroke: No; Thyroid Problems: No; Tuberculosis: No; Never smoker; Marital Status - Widowed; Alcohol Use: Never; Drug Use: No History; Caffeine Use: Rarely; Financial Concerns: No; Food,  Clothing or Shelter Needs: No; Support System Lacking: No; Transportation Concerns: No Electronic Signature(s) Signed: 01/19/2022 1:38:49 PM By: Geralyn CorwinHoffman, Vega Withrow DO Entered By: Geralyn CorwinHoffman, [place] Severs on 01/19/2022 13:22:29 -------------------------------------------------------------------------------- SuperBill Details Patient Name: Date of Service: Vicki [name], Troyce [name]. 01/19/2022 Medical Record Number: [id number] Patient Account Number: [id number] Date of Birth/Sex: Treating RN: 09-Jul-1923 (88 y.o. 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 T79.8XXA Other early complications of trauma, initial encounter E03.9 Hypothyroidism, unspecified I87.311 Chronic venous hypertension (idiopathic) with ulcer of right lower extremity Facility Procedures : CPT4 Code: [id number] Description: 11042 - DEB SUBQ TISSUE 20 SQ CM/< ICD-10 Diagnosis Description L97.812 Non-pressure chronic ulcer  of other part of right lower leg with fat layer exp Modifier: osed Quantity: 1 Physician Procedures : CPT4 Code Description [id number] 11042 - WC PHYS SUBQ TISS 20 SQ CM ICD-10 Diagnosis Description L97.812 Non-pressure chronic ulcer of other part of right lower leg with fat layer exposed Quantity: 1 Electronic Signature(s) Signed: 01/19/2022 1:38:49 PM By: Geralyn Corwin DO Entered By: Geralyn Corwin on 01/19/2022 13:24:28

## 2022-01-19 NOTE — Progress Notes (Signed)
Vicki [name], Vicki [name] ([id number]) [id number] Page 1 of 9 Visit Report for 01/19/2022 Arrival Information Details Patient Name: Date of Service: Vicki [name], Vicki [name] 01/19/2022 12:30 PM Medical Record Number: [id number] Patient Account Number: [id number] Date of Birth/Sex: Treating RN: September 01, 1923 (88 y.o. [name], [name] Primary Care Magnolia Mattila: Elenor Quinones Other Clinician: Referring Shahara Hartsfield: Treating Ellamay Fors/Extender: [place] Poli in Treatment: 13 Visit Information History Since Last Visit Added or deleted any medications: No Patient Arrived: Walker Any new allergies or adverse reactions: No Arrival Time: 12:50 Had a fall or experienced change in No Accompanied By: son activities of daily living that may affect Transfer Assistance: None risk of falls: Patient Identification Verified: Yes Signs or symptoms of abuse/neglect since last visito No Secondary Verification Process Completed: Yes Hospitalized since last visit: No Patient Requires Transmission-Based Precautions: No Implantable device outside of the clinic excluding No Patient Has Alerts: No cellular tissue based products placed in the center since last visit: Has Dressing in Place as Prescribed: Yes Has Compression in Place as Prescribed: Yes Pain Present Now: No Electronic Signature(s) Signed: 01/19/2022 4:27:51 PM By: Deon Pilling RN, BSN Entered By: Deon Pilling on 01/19/2022 12:55:17 -------------------------------------------------------------------------------- Encounter Discharge Information Details Patient Name: Date of Service: Vicki Rhymes [name]. 01/19/2022 12:30 PM Medical Record Number: [id number] Patient Account Number: [id number] Date of Birth/Sex: Treating RN: Oct 26, 1923 (88 y.o. [name], [name] Primary Care Kaleel Schmieder: Elenor Quinones Other Clinician: Referring Jakhi Dishman: Treating Maxen Rowland/Extender: [place] Poli in Treatment: 13 Encounter Discharge Information Items Post Procedure Vitals Discharge Condition: Stable Temperature (F): 98 Ambulatory Status: Ambulatory Pulse (bpm): 74 Discharge Destination: Home Respiratory Rate (breaths/min): 17 Transportation: Private Auto Blood Pressure (mmHg): 134/74 Accompanied By: son Schedule Follow-up Appointment: Yes Clinical Summary of Care: Patient Declined Electronic Signature(s) Signed: 01/19/2022 4:45:06 PM By: Rhae Hammock RN Entered By: Rhae Hammock on 01/19/2022 13:13:17 Huffaker, Airel [name] ([id number]) [id number] Page 2 of 9 -------------------------------------------------------------------------------- Lower Extremity Assessment Details Patient Name: Date of Service: Vicki [name], Vicki [name] 01/19/2022 12:30 PM Medical Record Number: [id number] Patient Account Number: [id number] Date of Birth/Sex: Treating RN: 06-Dec-1923 (88 y.o. [name], [name] Primary Care Sri Clegg: Elenor Quinones Other Clinician: Referring Shyanna Klingel: Treating Torre Pikus/Extender: [place] Poli in Treatment: 13 Edema Assessment Assessed: Shirlyn Goltz: No] Patrice Paradise: Yes] Edema: [Left: N] [Right: o] Calf Left: Right: Point of Measurement: 28 cm From Medial Instep 34 cm Ankle Left: Right: Point of Measurement: 12 cm From Medial Instep 20 cm Vascular Assessment Pulses: Dorsalis Pedis Palpable: [Right:Yes] Electronic Signature(s) Signed: 01/19/2022 4:27:51 PM By: Deon Pilling RN, BSN Entered By: Deon Pilling on 01/19/2022 12:56:02 -------------------------------------------------------------------------------- Multi Wound Chart Details Patient Name: Date of Service: Vicki Rhymes [name]. 01/19/2022 12:30 PM Medical Record Number: [id number] Patient Account Number: [id number] Date of Birth/Sex: Treating RN: 1923/05/23 (88 y.o. F) Primary Care Chanika Byland: Elenor Quinones Other Clinician: Referring  Westley Blass: Treating Calixto Pavel/Extender: [place] Poli in Treatment: 13 Vital Signs Height(in): 62 Pulse(bpm): 76 Weight(lbs): 142 Blood Pressure(mmHg): 156/68 Body Mass Index(BMI): 26 Temperature(F): 98.1 Respiratory Rate(breaths/min): 20 [1:Photos:] [N/A:N/A] Right, Lateral Lower Leg N/A N/A Wound Location: Trauma N/A N/A Wounding Event: Abrasion N/A N/A Primary Etiology: Lymphedema N/A N/A Secondary Etiology: Congestive Heart Failure, N/A N/A Comorbid History: Hypertension, Osteoarthritis, Neuropathy 08/04/2021 N/A N/A Date Acquired: 13 N/A N/A Weeks of Treatment: Open N/A N/A Wound Status: No N/A N/A Wound Recurrence: 7x2.5x0.1 N/A N/A Measurements L x W x D (cm) 13.744 N/A N/A A (cm) : rea 1.374  N/A N/A Volume (cm) : 74.30% N/A N/A % Reduction in A rea: 94.90% N/A N/A % Reduction in Volume: Full Thickness Without Exposed N/A N/A Classification: Support Structures Medium N/A N/A Exudate A mount: Serosanguineous N/A N/A Exudate Type: red, brown N/A N/A Exudate Color: Distinct, outline attached N/A N/A Wound Margin: Medium (34-66%) N/A N/A Granulation A mount: Red, Pink, Hyper-granulation N/A N/A Granulation Quality: Small (1-33%) N/A N/A Necrotic A mount: Fat Layer (Subcutaneous Tissue): Yes N/A N/A Exposed Structures: Fascia: No Tendon: No Muscle: No Joint: No Bone: No Large (67-100%) N/A N/A Epithelialization: Debridement - Excisional N/A N/A Debridement: Pre-procedure Verification/Time Out 12:55 N/A N/A Taken: Lidocaine N/A N/A Pain Control: Subcutaneous, Slough N/A N/A Tissue Debrided: Skin/Subcutaneous Tissue N/A N/A Level: 17.5 N/A N/A Debridement A (sq cm): rea Curette N/A N/A Instrument: Minimum N/A N/A Bleeding: Pressure N/A N/A Hemostasis A chieved: 0 N/A N/A Procedural Pain: 0 N/A N/A Post Procedural Pain: Procedure was tolerated well N/A N/A Debridement Treatment  Response: 7x2.5x0.1 N/A N/A Post Debridement Measurements L x W x D (cm) 1.374 N/A N/A Post Debridement Volume: (cm) Scarring: Yes N/A N/A Periwound Skin Texture: Excoriation: No Induration: No Callus: No Crepitus: No Rash: No Maceration: No N/A N/A Periwound Skin Moisture: Dry/Scaly: No Hemosiderin Staining: Yes N/A N/A Periwound Skin Color: Atrophie Blanche: No Cyanosis: No Ecchymosis: No Erythema: No Mottled: No Pallor: No Rubor: No Debridement N/A N/A Procedures Performed: Treatment Notes Wound #1 (Lower Leg) Wound Laterality: Right, Lateral Cleanser Soap and Water Discharge Instruction: May shower and wash wound with dial antibacterial soap and water prior to dressing change. Wound Cleanser Discharge Instruction: Cleanse the wound with wound cleanser prior to applying a clean dressing using gauze sponges, not tissue or cotton balls. Peri-Wound Care Zinc Oxide Ointment 30g tube Discharge Instruction: Apply Zinc Oxide to periwound with each dressing change Sween Lotion (Moisturizing lotion) Discharge Instruction: Apply moisturizing lotion as directed TAMYIA, MINICH ([id number]) [id number] Page 4 of 9 Topical Gentamicin Discharge Instruction: add directly to wound bed. Mupirocin Ointment Discharge Instruction: Apply Mupirocin (Bactroban) as instructed Primary Dressing Hydrofera Blue Ready Foam, 2.5 x2.5 in Discharge Instruction: Apply to wound bed as instructed Secondary Dressing Zetuvit Plus 4x8 in Discharge Instruction: Apply over primary dressing as directed. Secured With Compression Wrap Kerlix Roll 4.5x3.1 (in/yd) Discharge Instruction: Apply Kerlix and Coban compression as directed. Coban Self-Adherent Wrap 4x5 (in/yd) Discharge Instruction: Apply over Kerlix as directed. Compression Stockings Add-Ons Electronic Signature(s) Signed: 01/19/2022 1:38:49 PM By: Kalman Shan DO Entered By: Kalman Shan on 01/19/2022  13:20:40 -------------------------------------------------------------------------------- Multi-Disciplinary Care Plan Details Patient Name: Date of Service: Vicki Rhymes [name]. 01/19/2022 12:30 PM Medical Record Number: [id number] Patient Account Number: [id number] Date of Birth/Sex: Treating RN: [birth year] (88 y.o. [name], [name] Primary Care Darnella Zeiter: Elenor Quinones Other Clinician: Referring Tinna Kolker: Treating Nozomi Mettler/Extender: [place] Poli in Treatment: 13 Active Inactive Nutrition Nursing Diagnoses: Potential for alteratiion in Nutrition/Potential for imbalanced nutrition Goals: Patient/caregiver agrees to and verbalizes understanding of need to obtain nutritional consultation Date Initiated: 10/20/2021 Target Resolution Date: 01/31/2022 Goal Status: Active Interventions: Provide education on nutrition Treatment Activities: Education provided on Nutrition : 01/12/2022 Patient referred to Primary Care Physician for further nutritional evaluation : 10/20/2021 Notes: Pain, Acute or Chronic Nursing Diagnoses: Pain, acute or chronic: actual or potential Vicki [name], Vicki [name] ([id number]) [id number] Page 5 of 9 Potential alteration in comfort, pain Goals: Patient will verbalize adequate pain control and receive pain control interventions during procedures as needed Date Initiated: 10/20/2021 Target  Resolution Date: 01/31/2022 Goal Status: Active Patient/caregiver will verbalize comfort level met Date Initiated: 10/20/2021 Date Inactivated: 11/04/2021 Target Resolution Date: 11/06/2021 Goal Status: Met Interventions: Assess comfort goal upon admission Provide education on pain management Treatment Activities: Administer pain control measures as ordered : 10/20/2021 Notes: Wound/Skin Impairment Nursing Diagnoses: Knowledge deficit related to ulceration/compromised skin integrity Goals: Patient/caregiver will  verbalize understanding of skin care regimen Date Initiated: 10/20/2021 Target Resolution Date: 01/31/2022 Goal Status: Active Interventions: Assess patient/caregiver ability to perform ulcer/skin care regimen upon admission and as needed Assess ulceration(s) every visit Provide education on ulcer and skin care Treatment Activities: Skin care regimen initiated : 10/20/2021 Topical wound management initiated : 10/20/2021 Notes: Electronic Signature(s) Signed: 01/19/2022 4:45:06 PM By: Rhae Hammock RN Entered By: Rhae Hammock on 01/19/2022 13:03:13 -------------------------------------------------------------------------------- Pain Assessment Details Patient Name: Date of Service: Vicki Rhymes [name]. 01/19/2022 12:30 PM Medical Record Number: [id number] Patient Account Number: [id number] Date of Birth/Sex: Treating RN: 10/30/1923 (88 y.o. [name] Primary Care Lorry Anastasi: Elenor Quinones Other Clinician: Referring Nicholle Falzon: Treating Zevin Nevares/Extender: [place] Poli in Treatment: 13 Active Problems Location of Pain Severity and Description of Pain Patient Has Paino No Site Locations [place], [place] [name] ([id number]) [id number] Page 6 of 9 Pain Management and Medication Current Pain Management: Medication: No Cold Application: No Rest: No Massage: No Activity: No LewisE.N.S.: No Heat Application: No Leg drop or elevation: No Is the Current Pain Management Adequate: Adequate How does your wound impact your activities of daily livingo Sleep: No Bathing: No Appetite: No Relationship With Others: No Bladder Continence: No Emotions: No Bowel Continence: No Work: No Toileting: No Drive: No Dressing: No Hobbies: No Notes pain at night this past week. Electronic Signature(s) Signed: 01/19/2022 4:27:51 PM By: Deon Pilling RN, BSN Entered By: Deon Pilling on 01/19/2022  12:55:50 -------------------------------------------------------------------------------- Patient/Caregiver Education Details Patient Name: Date of Service: Vicki [name] 11/20/2023andnbsp12:30 PM Medical Record Number: [id number] Patient Account Number: [id number] Date of Birth/Gender: Treating RN: 1923-12-09 (88 y.o. Benjaman Lobe Primary Care Physician: Elenor Quinones Other Clinician: Referring Physician: Treating Physician/Extender: [place] Poli in Treatment: 13 Education Assessment Education Provided To: Patient Education Topics Provided Nutrition: Methods: Explain/Verbal Responses: State content correctly Electronic Signature(s) Signed: 01/19/2022 4:45:06 PM By: Rhae Hammock RN Entered By: Rhae Hammock on 01/19/2022 13:03:28 Vicki [name] ([id number]) [id number] Page 7 of 9 -------------------------------------------------------------------------------- Wound Assessment Details Patient Name: Date of Service: Vicki [name], Vicki [name] 01/19/2022 12:30 PM Medical Record Number: [id number] Patient Account Number: [id number] Date of Birth/Sex: Treating RN: [birth year] (88 y.o. [name], [name] Primary Care Hendrix Yurkovich: Elenor Quinones Other Clinician: Referring Ranie Chinchilla: Treating Xiara Knisley/Extender: [place] Poli in Treatment: 13 Wound Status Wound Number: 1 Primary Etiology: Abrasion Wound Location: Right, Lateral Lower Leg Secondary Lymphedema Etiology: Wounding Event: Trauma Wound Status: Open Date Acquired: 08/04/2021 Comorbid Congestive Heart Failure, Hypertension, Osteoarthritis, Weeks Of Treatment: 13 History: Neuropathy Clustered Wound: No Photos Wound Measurements Length: (cm) 7 Width: (cm) 2.5 Depth: (cm) 0.1 Area: (cm) 13.744 Volume: (cm) 1.374 % Reduction in Area: 74.3% % Reduction in Volume: 94.9% Epithelialization: Large  (67-100%) Tunneling: No Undermining: No Wound Description Classification: Full Thickness Without Exposed Suppor Wound Margin: Distinct, outline attached Exudate Amount: Medium Exudate Type: Serosanguineous Exudate Color: red, brown [name] Structures Foul Odor After Cleansing: No Slough/Fibrino Yes Wound Bed Granulation Amount: Medium (34-66%) Exposed Structure Granulation Quality: Red, Pink, Hyper-granulation Fascia Exposed: No Necrotic Amount: Small (1-33%) Fat Layer (Subcutaneous Tissue) Exposed: Yes  Necrotic Quality: Adherent Slough Tendon Exposed: No Muscle Exposed: No Joint Exposed: No Bone Exposed: No Periwound Skin Texture Texture Color No Abnormalities Noted: No No Abnormalities Noted: No Callus: No Atrophie Blanche: No Crepitus: No Cyanosis: No Excoriation: No Ecchymosis: No Induration: No Erythema: No Rash: No Hemosiderin Staining: Yes Scarring: Yes Mottled: No Pallor: No Moisture Rubor: No No Abnormalities Noted: No Vicki [name], Vicki [name] ([id number]) [id number] Page 8 of 9 Dry / Scaly: No Maceration: No Treatment Notes Wound #1 (Lower Leg) Wound Laterality: Right, Lateral Cleanser Soap and Water Discharge Instruction: May shower and wash wound with dial antibacterial soap and water prior to dressing change. Wound Cleanser Discharge Instruction: Cleanse the wound with wound cleanser prior to applying a clean dressing using gauze sponges, not tissue or cotton balls. Peri-Wound Care Zinc Oxide Ointment 30g tube Discharge Instruction: Apply Zinc Oxide to periwound with each dressing change Sween Lotion (Moisturizing lotion) Discharge Instruction: Apply moisturizing lotion as directed Topical Gentamicin Discharge Instruction: add directly to wound bed. Mupirocin Ointment Discharge Instruction: Apply Mupirocin (Bactroban) as instructed Primary Dressing Hydrofera Blue Ready Foam, 2.5 x2.5 in Discharge Instruction: Apply to wound bed  as instructed Secondary Dressing Zetuvit Plus 4x8 in Discharge Instruction: Apply over primary dressing as directed. Secured With Compression Wrap Kerlix Roll 4.5x3.1 (in/yd) Discharge Instruction: Apply Kerlix and Coban compression as directed. Coban Self-Adherent Wrap 4x5 (in/yd) Discharge Instruction: Apply over Kerlix as directed. Compression Stockings Add-Ons Electronic Signature(s) Signed: 01/19/2022 4:27:51 PM By: Deon Pilling RN, BSN Entered By: Deon Pilling on 01/19/2022 12:56:47 -------------------------------------------------------------------------------- Vitals Details Patient Name: Date of Service: Vicki Rhymes [name]. 01/19/2022 12:30 PM Medical Record Number: [id number] Patient Account Number: [id number] Date of Birth/Sex: Treating RN: 06-26-1923 (88 y.o. [name], [name] Primary Care Kvion Shapley: Elenor Quinones Other Clinician: Referring Donye Dauenhauer: Treating Abron Neddo/Extender: [place] Poli in Treatment: 13 Vital Signs Time Taken: 12:50 Temperature (F): 98.1 Height (in): 62 Pulse (bpm): 64 Weight (lbs): 142 Respiratory Rate (breaths/min): 20 Body Mass Index (BMI): 26 Blood Pressure (mmHg): 156/68 Reference Range: 80 - 120 mg / dl Vicki [name], Vicki [name] ([id number]) [id number] Page 9 of 9 Electronic Signature(s) Signed: 01/19/2022 4:27:51 PM By: Deon Pilling RN, BSN Entered By: Deon Pilling on 01/19/2022 12:55:34

## 2022-01-26 ENCOUNTER — Encounter (HOSPITAL_BASED_OUTPATIENT_CLINIC_OR_DEPARTMENT_OTHER): Payer: Medicare Other | Admitting: Internal Medicine

## 2022-01-26 DIAGNOSIS — L97812 Non-pressure chronic ulcer of other part of right lower leg with fat layer exposed: Secondary | ICD-10-CM | POA: Diagnosis not present

## 2022-01-27 NOTE — Progress Notes (Signed)
Vicki [name], Vicki [name] ([id number]) [id number] Page 1 of 9 Visit Report for 01/26/2022 Chief Complaint Document Details Patient Name: Date of Service: Vicki [name], Vicki [name]. 01/26/2022 1:00 PM Medical Record Number: [id number] Patient Account Number: [id number] Date of Birth/Sex: Treating RN: 08-11-1923 (88 y.o. F) Primary Care Provider: Elenor Quinones Other Clinician: Referring Provider: Treating Provider/Extender: Hartford Poli in Treatment: 14 Information Obtained from: Patient Chief Complaint 10/20/2021; right lower extremity wound status post trauma Electronic Signature(s) Signed: 01/26/2022 2:58:01 PM By: Kalman Shan DO Entered By: Kalman Shan on 01/26/2022 13:23:37 -------------------------------------------------------------------------------- Debridement Details Patient Name: Date of Service: Vicki [name], Vicki [name]. 01/26/2022 1:00 PM Medical Record Number: [id number] Patient Account Number: [id number] Date of Birth/Sex: Treating RN: January 24, 1924 (88 y.o. Vicki [name], Vicki [name] Primary Care Provider: Elenor Quinones Other Clinician: Referring Provider: Treating Provider/Extender: Hartford Poli in Treatment: 14 Debridement Performed for Assessment: Wound #1 Right,Lateral Lower Leg Performed By: Physician Kalman Shan, DO Debridement Type: Debridement Level of Consciousness (Pre-procedure): Awake and Alert Pre-procedure Verification/Time Out Yes - 13:00 Taken: Start Time: 13:01 Pain Control: Lidocaine 4% [name] Area Debrided (L x W): otal 5.8 (cm) x 1.7 (cm) = 9.86 (cm) Tissue and other material debrided: Viable, Non-Viable, Slough, Subcutaneous, Skin: Dermis , Skin: Epidermis, Slough Level: Skin/Subcutaneous Tissue Debridement Description: Excisional Instrument: Curette Bleeding: Minimum Hemostasis Achieved: Pressure End Time: 13:06 Procedural Pain: 0 Post  Procedural Pain: 0 Response to Treatment: Procedure was tolerated well Level of Consciousness (Post- Awake and Alert procedure): Post Debridement Measurements of Total Wound Length: (cm) 5.8 Width: (cm) 1.7 Depth: (cm) 0.1 Volume: (cm) 0.774 Character of Wound/Ulcer Post Debridement: Improved Post Procedure Diagnosis Vicki [name] [name] ([id number]) [id number] Page 2 of 9 Same as Pre-procedure Electronic Signature(s) Signed: 01/26/2022 2:58:01 PM By: Kalman Shan DO Signed: 01/26/2022 6:45:57 PM By: Deon Pilling RN, BSN Entered By: Deon Pilling on 01/26/2022 13:06:10 -------------------------------------------------------------------------------- HPI Details Patient Name: Date of Service: Vicki [name], Vicki [name]. 01/26/2022 1:00 PM Medical Record Number: [id number] Patient Account Number: [id number] Date of Birth/Sex: Treating RN: [birth year] (88 y.o. F) Primary Care Provider: Elenor Quinones Other Clinician: Referring Provider: Treating Provider/Extender: Hartford Poli in Treatment: 14 History of Present Illness HPI Description: Admission 10/20/2021 [name] is a 88 year old female with a past medical history of hypothyroidism and essential hypertension that presents to the clinic for a 43-month history of ulcer to the right lower extremity. On 08/15/2021 the patient experienced a mechanical fall and hit her right leg against her table. She developed a hematoma and she was evaluated in the ED for this issue. The hematoma was evacuated in the ED. She had x-rays of the tibia/fibula and ankle that showed no acute osseous abnormalities. She has been using silver alginate to the wound bed. She did have 2 rounds of doxycycline for this issue and recently completed her second course. She currently denies signs of infection. 8/28; patient presents for follow-up. She has no issues or complaints today. We have been doing  Santyl and Hydrofera Blue under Kerlix/Coban. She has home health that they start coming out once weekly. She denies signs of infection. 9/5; patient presents for follow-up. We have been doing Hydrofera Blue with antibiotic ointment under Kerlix/Coban. She has no issues or complaints today. 9/11; patient presents for follow-up. We have been doing Hydrofera Blue under Kerlix/Coban. Home health has been changing the dressing weekly. The wrap was too tight at the more proximal and cutting into the patient's  skin. Other than that she has no issues or complaints today. 9/18; patient presents for follow-up. We have been using PolyMem silver under Kerlix/Coban for the past week. She has no issues or complaints today. 9/25; patient presents for follow-up. We have been using PolyMem silver under Kerlix/Coban. She has no issues or complaints today. 10/2; patient presents for follow-up. We have been using PolyMem silver with antibiotic ointment under compression therapy. Patient has no issues or complaints today. 10/9; patient presents for follow-up. We have been using PolyMem silver under compression therapy. Patient has no issues or complaints today. She reports improvement in wound healing. 10/16; patient presents for follow-up. We have been using PolyMem silver with antibiotic ointment under compression therapy. Patient has no issues or complaints today. 10/23; patient presents for follow-up. We have been using PolyMem silver with antibiotic ointment under Kerlix/Coban. She has no issues or complaints today. 10/30; patient presents for follow-up. We have been using PolyMem silver with antibiotic ointment under Kerlix/Coban. Her wound continues to show signs of healing. 11/13; patient presents for follow-up. We have been using PolyMem silver with antibiotic ointment under Kerlix/Coban. She has more hyper granulated tissue and nonviable tissue today. She denies signs of infection. 11/20; patient presents  for follow-up. We have been using Hydrofera Blue with antibiotic ointment under compression therapy. Patient states she is tolerating this well. She denies signs of infection. 11/27; patient presents for follow-up. We have been using Hydrofera Blue with antibiotic ointment under compression therapy. She has made significant improvement in wound healing over the past week. She has no issues or complaints today. Electronic Signature(s) Signed: 01/26/2022 2:58:01 PM By: Kalman Shan DO Entered By: Kalman Shan on 01/26/2022 13:24:21 [name] ([id number]) [id number] Page 3 of 9 -------------------------------------------------------------------------------- Physical Exam Details Patient Name: Date of Service: Vicki [name], Vicki [name]. 01/26/2022 1:00 PM Medical Record Number: [id number] Patient Account Number: [id number] Date of Birth/Sex: Treating RN: 07/14/1923 (88 y.o. F) Primary Care Provider: Elenor Quinones Other Clinician: Referring Provider: Treating Provider/Extender: Hartford Poli in Treatment: 14 Constitutional respirations regular, non-labored and within target range for patient.. Cardiovascular 2+ dorsalis pedis/posterior tibialis pulses. Psychiatric pleasant and cooperative. Notes Right lower extremity: [name] the anterior aspect there is an open wound with granulation tissue and Slough. No surrounding signs of infection. Venous stasis o dermatitis. Good edema control. Electronic Signature(s) Signed: 01/26/2022 2:58:01 PM By: Kalman Shan DO Entered By: Kalman Shan on 01/26/2022 13:24:48 -------------------------------------------------------------------------------- Physician Orders Details Patient Name: Date of Service: Vicki Rhymes [name]. 01/26/2022 1:00 PM Medical Record Number: [id number] Patient Account Number: [id number] Date of Birth/Sex: Treating RN: 1923-11-25 (88 y.o. [name] Primary Care Provider: Elenor Quinones Other Clinician: Referring Provider: Treating Provider/Extender: Hartford Poli in Treatment: 14 Verbal / Phone Orders: No Diagnosis Coding ICD-10 Coding Code Description [phone] Non-pressure chronic ulcer of other part of right lower leg with fat layer exposed T79.8XXA Other early complications of trauma, initial encounter E03.9 Hypothyroidism, unspecified I87.311 Chronic venous hypertension (idiopathic) with ulcer of right lower extremity Follow-up Appointments ppointment in 1 week. - w/ Dr. Heber [place] Return A ppointment in 2 weeks. - Dr. Heber Rich Return A Anesthetic (In clinic) Topical Lidocaine 5% applied to wound bed (In clinic) Topical Lidocaine 4% applied to wound bed Bathing/ Shower/ Hygiene May shower with protection but do not get wound dressing(s) wet. Edema Control - Lymphedema / SCD / Other Elevate legs to the level of the heart or above for 30 minutes daily  and/or when sitting, a frequency of: - 3-4 times a day throughout the day. Avoid standing for long periods of time. Exercise regularly Wound Treatment Wound #1 - Lower Leg Wound Laterality: Right, Lateral Cleanser: Soap and Water 1 x Per Week/30 Days Discharge Instructions: May shower and wash wound with dial antibacterial soap and water prior to dressing change. Vicki [name], Vicki [name] ([id number]) [id number] Page 4 of 9 Cleanser: Wound Cleanser 1 x Per Week/30 Days Discharge Instructions: Cleanse the wound with wound cleanser prior to applying a clean dressing using gauze sponges, not tissue or cotton balls. Peri-Wound Care: Zinc Oxide Ointment 30g tube 1 x Per Week/30 Days Discharge Instructions: Apply Zinc Oxide to periwound lightly around the periwound. Peri-Wound Care: Sween Lotion (Moisturizing lotion) 1 x Per Week/30 Days Discharge Instructions: Apply moisturizing lotion as directed Topical: Gentamicin 1 x Per  Week/30 Days Discharge Instructions: add directly to wound bed. Topical: Mupirocin Ointment 1 x Per Week/30 Days Discharge Instructions: Apply Mupirocin (Bactroban) as instructed Prim Dressing: Hydrofera Blue Ready Foam, 2.5 x2.5 in 1 x Per Week/30 Days ary Discharge Instructions: Apply to wound bed as instructed Secondary Dressing: Zetuvit Plus 4x8 in 1 x Per Week/30 Days Discharge Instructions: Apply over primary dressing as directed. Compression Wrap: Kerlix Roll 4.5x3.1 (in/yd) (Home Health) 1 x Per Week/30 Days Discharge Instructions: Apply Kerlix and Coban compression as directed. Compression Wrap: Coban Self-Adherent Wrap 4x5 (in/yd) (Home Health) 1 x Per Week/30 Days Discharge Instructions: Apply over Kerlix as directed. Electronic Signature(s) Signed: 01/26/2022 2:58:01 PM By: Kalman Shan DO Entered By: Kalman Shan on 01/26/2022 13:24:55 -------------------------------------------------------------------------------- Problem List Details Patient Name: Date of Service: Vicki Rhymes [name]. 01/26/2022 1:00 PM Medical Record Number: [id number] Patient Account Number: [id number] Date of Birth/Sex: Treating RN: Sep 19, 1923 (88 y.o. Vicki [name], Vicki [name] Primary Care Provider: Elenor Quinones Other Clinician: Referring Provider: Treating Provider/Extender: Hartford Poli in Treatment: 14 Active Problems ICD-10 Encounter Code Description Active Date MDM Diagnosis [phone] Non-pressure chronic ulcer of other part of right lower leg with fat layer 10/20/2021 No Yes exposed T79.8XXA Other early complications of trauma, initial encounter 10/20/2021 No Yes E03.9 Hypothyroidism, unspecified 10/20/2021 No Yes I87.311 Chronic venous hypertension (idiopathic) with ulcer of right lower extremity 10/20/2021 No Yes Inactive Problems NAOME, MCBURNIE ([id number]) [id number] Page 5 of 9 Resolved Problems Electronic  Signature(s) Signed: 01/26/2022 2:58:01 PM By: Kalman Shan DO Entered By: Kalman Shan on 01/26/2022 13:23:19 -------------------------------------------------------------------------------- Progress Note Details Patient Name: Date of Service: Vicki Rhymes [name]. 01/26/2022 1:00 PM Medical Record Number: [id number] Patient Account Number: [id number] Date of Birth/Sex: Treating RN: 1923-11-16 (88 y.o. F) Primary Care Provider: Elenor Quinones Other Clinician: Referring Provider: Treating Provider/Extender: Hartford Poli in Treatment: 14 Subjective Chief Complaint Information obtained from Patient 10/20/2021; right lower extremity wound status post trauma History of Present Illness (HPI) Admission 10/20/2021 Ms. Alisse Twardzik is a 88 year old female with a past medical history of hypothyroidism and essential hypertension that presents to the clinic for a 39-month history of ulcer to the right lower extremity. On 08/15/2021 the patient experienced a mechanical fall and hit her right leg against her table. She developed a hematoma and she was evaluated in the ED for this issue. The hematoma was evacuated in the ED. She had x-rays of the tibia/fibula and ankle that showed no acute osseous abnormalities. She has been using silver alginate to the wound bed. She did have 2 rounds of doxycycline for this issue and recently completed her  second course. She currently denies signs of infection. 8/28; patient presents for follow-up. She has no issues or complaints today. We have been doing Santyl and Hydrofera Blue under Kerlix/Coban. She has home health that they start coming out once weekly. She denies signs of infection. 9/5; patient presents for follow-up. We have been doing Hydrofera Blue with antibiotic ointment under Kerlix/Coban. She has no issues or complaints today. 9/11; patient presents for follow-up. We have been doing Hydrofera Blue under  Kerlix/Coban. Home health has been changing the dressing weekly. The wrap was too tight at the more proximal and cutting into the patient's skin. Other than that she has no issues or complaints today. 9/18; patient presents for follow-up. We have been using PolyMem silver under Kerlix/Coban for the past week. She has no issues or complaints today. 9/25; patient presents for follow-up. We have been using PolyMem silver under Kerlix/Coban. She has no issues or complaints today. 10/2; patient presents for follow-up. We have been using PolyMem silver with antibiotic ointment under compression therapy. Patient has no issues or complaints today. 10/9; patient presents for follow-up. We have been using PolyMem silver under compression therapy. Patient has no issues or complaints today. She reports improvement in wound healing. 10/16; patient presents for follow-up. We have been using PolyMem silver with antibiotic ointment under compression therapy. Patient has no issues or complaints today. 10/23; patient presents for follow-up. We have been using PolyMem silver with antibiotic ointment under Kerlix/Coban. She has no issues or complaints today. 10/30; patient presents for follow-up. We have been using PolyMem silver with antibiotic ointment under Kerlix/Coban. Her wound continues to show signs of healing. 11/13; patient presents for follow-up. We have been using PolyMem silver with antibiotic ointment under Kerlix/Coban. She has more hyper granulated tissue and nonviable tissue today. She denies signs of infection. 11/20; patient presents for follow-up. We have been using Hydrofera Blue with antibiotic ointment under compression therapy. Patient states she is tolerating this well. She denies signs of infection. 11/27; patient presents for follow-up. We have been using Hydrofera Blue with antibiotic ointment under compression therapy. She has made significant improvement in wound healing over the past  week. She has no issues or complaints today. Patient History Information obtained from Patient. Family History Diabetes - Child, Heart Disease - Child,Mother,Father,Siblings, Hypertension - Father,Mother, No family history of Cancer, Hereditary Spherocytosis, Kidney Disease, Lung Disease, Seizures, Stroke, Thyroid Problems, Tuberculosis. Social History Never smoker, Marital Status - Widowed, Alcohol Use - Never, Drug Use - No History, Caffeine Use - Rarely. Medical History Vicki [name], Vicki [name] ([id number]) [id number] Page 6 of 9 Eyes Denies history of Cataracts, Glaucoma, Optic Neuritis Ear/Nose/Mouth/Throat Denies history of Chronic sinus problems/congestion, Middle ear problems Hematologic/Lymphatic Denies history of Anemia, Hemophilia, Human Immunodeficiency Virus, Lymphedema, Sickle Cell Disease Respiratory Denies history of Aspiration, Asthma, Chronic Obstructive Pulmonary Disease (COPD), Pneumothorax, Sleep Apnea, Tuberculosis Cardiovascular Patient has history of Congestive Heart Failure, Hypertension Denies history of Angina, Arrhythmia, Coronary Artery Disease, Deep Vein Thrombosis, Hypotension, Myocardial Infarction, Peripheral Arterial Disease, Peripheral Venous Disease, Phlebitis, Vasculitis Gastrointestinal Denies history of Cirrhosis , Colitis, Crohnoos, Hepatitis A, Hepatitis B, Hepatitis C Endocrine Denies history of Type I Diabetes, Type II Diabetes Immunological Denies history of Lupus Erythematosus, Raynaudoos, Scleroderma Integumentary (Skin) Denies history of History of Burn Musculoskeletal Patient has history of Osteoarthritis Denies history of Gout, Rheumatoid Arthritis, Osteomyelitis Neurologic Patient has history of Neuropathy Denies history of Dementia, Quadriplegia, Paraplegia, Seizure Disorder Psychiatric Denies history of Anorexia/bulimia, Confinement Anxiety Hospitalization/Surgery History - back  surgery. - right hip  surgery. - hysterotomy. Medical A Surgical History Notes nd Ear/Nose/Mouth/Throat hearing aides Genitourinary Stage three Objective Constitutional respirations regular, non-labored and within target range for patient.. Vitals Time Taken: 12:53 PM, Height: 62 in, Weight: 142 lbs, BMI: 26, Temperature: 98.1 F, Pulse: 60 bpm, Respiratory Rate: 20 breaths/min, Blood Pressure: 137/75 mmHg. Cardiovascular 2+ dorsalis pedis/posterior tibialis pulses. Psychiatric pleasant and cooperative. General Notes: Right lower extremity: [name] the anterior aspect there is an open wound with granulation tissue and Slough. No surrounding signs of infection. o Venous stasis dermatitis. Good edema control. Integumentary (Hair, Skin) Wound #1 status is Open. Original cause of wound was Trauma. The date acquired was: 08/04/2021. The wound has been in treatment 14 weeks. The wound is located on the Right,Lateral Lower Leg. The wound measures 5.8cm length x 1.7cm width x 0.1cm depth; 7.744cm^2 area and 0.774cm^3 volume. There is Fat Layer (Subcutaneous Tissue) exposed. There is no tunneling or undermining noted. There is a medium amount of serosanguineous drainage noted. The wound margin is distinct with the outline attached to the wound base. There is small (1-33%) red, pink granulation within the wound bed. There is a small (1-33%) amount of necrotic tissue within the wound bed including Adherent Slough. The periwound skin appearance exhibited: Scarring, Hemosiderin Staining. The periwound skin appearance did not exhibit: Callus, Crepitus, Excoriation, Induration, Rash, Dry/Scaly, Maceration, Atrophie Blanche, Cyanosis, Ecchymosis, Mottled, Pallor, Rubor, Erythema. Assessment Active Problems ICD-10 Non-pressure chronic ulcer of other part of right lower leg with fat layer exposed Other early complications of trauma, initial encounter Hypothyroidism, unspecified Chronic venous hypertension (idiopathic) with ulcer  of right lower extremity Vicki [name], Vicki [name] ([id number]) [id number] Page 7 of 9 Patient's wound has improved in size in appearance since last clinic visit. I debrided nonviable tissue. I recommended continuing the course with Hydrofera Blue and antibiotic ointment under compression therapy. Follow-up in 1 week. Procedures Wound #1 Pre-procedure diagnosis of Wound #1 is an Abrasion located on the Right,Lateral Lower Leg . There was a Excisional Skin/Subcutaneous Tissue Debridement with a total area of 9.86 sq cm performed by Kalman Shan, DO. With the following instrument(s): Curette to remove Viable and Non-Viable tissue/material. Material removed includes Subcutaneous Tissue, Slough, Skin: Dermis, and Skin: Epidermis after achieving pain control using Lidocaine 4% [name] opical Solution. A time out was conducted at 13:00, prior to the start of the procedure. A Minimum amount of bleeding was controlled with Pressure. The procedure was tolerated well with a pain level of 0 throughout and a pain level of 0 following the procedure. Post Debridement Measurements: 5.8cm length x 1.7cm width x 0.1cm depth; 0.774cm^3 volume. Character of Wound/Ulcer Post Debridement is improved. Post procedure Diagnosis Wound #1: Same as Pre-Procedure Plan Follow-up Appointments: Return Appointment in 1 week. - w/ Dr. Heber [place] Return Appointment in 2 weeks. - Dr. Heber [place] Anesthetic: (In clinic) Topical Lidocaine 5% applied to wound bed (In clinic) Topical Lidocaine 4% applied to wound bed Bathing/ Shower/ Hygiene: May shower with protection but do not get wound dressing(s) wet. Edema Control - Lymphedema / SCD / Other: Elevate legs to the level of the heart or above for 30 minutes daily and/or when sitting, a frequency of: - 3-4 times a day throughout the day. Avoid standing for long periods of time. Exercise regularly WOUND #1: - Lower Leg Wound Laterality: Right, Lateral Cleanser: Soap  and Water 1 x Per Week/30 Days Discharge Instructions: May shower and wash wound with dial antibacterial soap and water prior to dressing  change. Cleanser: Wound Cleanser 1 x Per Week/30 Days Discharge Instructions: Cleanse the wound with wound cleanser prior to applying a clean dressing using gauze sponges, not tissue or cotton balls. Peri-Wound Care: Zinc Oxide Ointment 30g tube 1 x Per Week/30 Days Discharge Instructions: Apply Zinc Oxide to periwound lightly around the periwound. Peri-Wound Care: Sween Lotion (Moisturizing lotion) 1 x Per Week/30 Days Discharge Instructions: Apply moisturizing lotion as directed Topical: Gentamicin 1 x Per Week/30 Days Discharge Instructions: add directly to wound bed. Topical: Mupirocin Ointment 1 x Per Week/30 Days Discharge Instructions: Apply Mupirocin (Bactroban) as instructed Prim Dressing: Hydrofera Blue Ready Foam, 2.5 x2.5 in 1 x Per Week/30 Days ary Discharge Instructions: Apply to wound bed as instructed Secondary Dressing: Zetuvit Plus 4x8 in 1 x Per Week/30 Days Discharge Instructions: Apply over primary dressing as directed. Com pression Wrap: Kerlix Roll 4.5x3.1 (in/yd) (Home Health) 1 x Per Week/30 Days Discharge Instructions: Apply Kerlix and Coban compression as directed. Com pression Wrap: Coban Self-Adherent Wrap 4x5 (in/yd) (Home Health) 1 x Per Week/30 Days Discharge Instructions: Apply over Kerlix as directed. 1. In office sharp debridement 2. Hydrofera Blue and antibiotic ointment under compression therapy 3. Follow-up in 1 week Electronic Signature(s) Signed: 01/26/2022 2:58:01 PM By: Kalman Shan DO Entered By: Kalman Shan on 01/26/2022 13:26:04 -------------------------------------------------------------------------------- HxROS Details Patient Name: Date of Service: Vicki [name], Vicki [name]. 01/26/2022 1:00 PM Medical Record Number: [id number] Patient Account Number: [id number] Date of Birth/Sex: Treating  RN: 10-12-1923 (88 y.o. Anson Crofts, Vicki [name] ([id number]) [id number] Page 8 of 9 Primary Care Provider: Elenor Quinones Other Clinician: Referring Provider: Treating Provider/Extender: Hartford Poli in Treatment: 14 Information Obtained From Patient Eyes Medical History: Negative for: Cataracts; Glaucoma; Optic Neuritis Ear/Nose/Mouth/Throat Medical History: Negative for: Chronic sinus problems/congestion; Middle ear problems Past Medical History Notes: hearing aides Hematologic/Lymphatic Medical History: Negative for: Anemia; Hemophilia; Human Immunodeficiency Virus; Lymphedema; Sickle Cell Disease Respiratory Medical History: Negative for: Aspiration; Asthma; Chronic Obstructive Pulmonary Disease (COPD); Pneumothorax; Sleep Apnea; Tuberculosis Cardiovascular Medical History: Positive for: Congestive Heart Failure; Hypertension Negative for: Angina; Arrhythmia; Coronary Artery Disease; Deep Vein Thrombosis; Hypotension; Myocardial Infarction; Peripheral Arterial Disease; Peripheral Venous Disease; Phlebitis; Vasculitis Gastrointestinal Medical History: Negative for: Cirrhosis ; Colitis; Crohns; Hepatitis A; Hepatitis B; Hepatitis C Endocrine Medical History: Negative for: Type I Diabetes; Type II Diabetes Genitourinary Medical History: Past Medical History Notes: Stage three Immunological Medical History: Negative for: Lupus Erythematosus; Raynauds; Scleroderma Integumentary (Skin) Medical History: Negative for: History of Burn Musculoskeletal Medical History: Positive for: Osteoarthritis Negative for: Gout; Rheumatoid Arthritis; Osteomyelitis Neurologic Medical History: Positive for: Neuropathy Negative for: Dementia; Quadriplegia; Paraplegia; Seizure Disorder Psychiatric Medical History: Negative for: Pollyann Savoy KERRIE, COLLUMS [name] ([id number])  [id number] Page 9 of 9 Immunizations Pneumococcal Vaccine: Received Pneumococcal Vaccination: Yes Received Pneumococcal Vaccination On or After 60th Birthday: Yes Implantable Devices Yes Hospitalization / Surgery History Type of Hospitalization/Surgery back surgery right hip surgery hysterotomy Family and Social History Cancer: No; Diabetes: Yes - Child; Heart Disease: Yes - Child,Mother,Father,Siblings; Hereditary Spherocytosis: No; Hypertension: Yes - Father,Mother; Kidney Disease: No; Lung Disease: No; Seizures: No; Stroke: No; Thyroid Problems: No; Tuberculosis: No; Never smoker; Marital Status - Widowed; Alcohol Use: Never; Drug Use: No History; Caffeine Use: Rarely; Financial Concerns: No; Food, Clothing or Shelter Needs: No; Support System Lacking: No; Transportation Concerns: No Electronic Signature(s) Signed: 01/26/2022 2:58:01 PM By: Kalman Shan DO Entered By: Kalman Shan on 01/26/2022 13:24:26 -------------------------------------------------------------------------------- SuperBill Details Patient Name: Date of Service: Vicki [name]  AZARI, DRUSCHEL [name]. 01/26/2022 Medical Record Number: [id number] Patient Account Number: [id number] Date of Birth/Sex: Treating RN: 1923/12/16 (88 y.o. [name] Primary Care Provider: Elenor Quinones Other Clinician: Referring Provider: Treating Provider/Extender: Hartford Poli in Treatment: 14 Diagnosis Coding ICD-10 Codes Code Description [phone] Non-pressure chronic ulcer of other part of right lower leg with fat layer exposed T79.8XXA Other early complications of trauma, initial encounter E03.9 Hypothyroidism, unspecified I87.311 Chronic venous hypertension (idiopathic) with ulcer of right lower extremity Facility Procedures : CPT4 Code: [id number] Description: [id number] - DEB SUBQ TISSUE 20 SQ CM/< ICD-10 Diagnosis Description L97.812 Non-pressure chronic ulcer of other  part of right lower leg with fat layer exp Modifier: osed Quantity: 1 Physician Procedures : CPT4 Code Description Modifier [id number] - WC PHYS SUBQ TISS 20 SQ CM ICD-10 Diagnosis Description [id number] Non-pressure chronic ulcer of other part of right lower leg with fat layer exposed Quantity: 1 Electronic Signature(s) Signed: 01/26/2022 2:58:01 PM By: Kalman Shan DO Entered By: Kalman Shan on 01/26/2022 13:26:11

## 2022-01-27 NOTE — Progress Notes (Signed)
Vicki [name] ([id number]) [id number] Page 1 of 9 Visit Report for 01/26/2022 Arrival Information Details Patient Name: Date of Service: Vicki [name], Vicki [name]. 01/26/2022 1:00 PM Medical Record Number: [id number] Patient Account Number: [id number] Date of Birth/Sex: Treating RN: 1924-02-23 (88 y.o. [name], Meta.Reding Primary Care Jimia Gentles: Elenor Quinones Other Clinician: Referring Deja Pisarski: Treating Joene Gelder/Extender: Hartford Poli in Treatment: 14 Visit Information History Since Last Visit Added or deleted any medications: No Patient Arrived: Walker Any new allergies or adverse reactions: No Arrival Time: 12:51 Had a fall or experienced change in No Accompanied By: daughter in law activities of daily living that may affect Transfer Assistance: None risk of falls: Patient Identification Verified: Yes Signs or symptoms of abuse/neglect since last visito No Secondary Verification Process Completed: Yes Hospitalized since last visit: No Patient Requires Transmission-Based Precautions: No Implantable device outside of the clinic excluding No Patient Has Alerts: No cellular tissue based products placed in the center since last visit: Has Dressing in Place as Prescribed: Yes Has Compression in Place as Prescribed: Yes Pain Present Now: No Electronic Signature(s) Signed: 01/26/2022 6:45:57 PM By: Deon Pilling RN, BSN Entered By: Deon Pilling on 01/26/2022 12:52:21 -------------------------------------------------------------------------------- Encounter Discharge Information Details Patient Name: Date of Service: Vicki [name], Vicki [name]. 01/26/2022 1:00 PM Medical Record Number: [id number] Patient Account Number: [id number] Date of Birth/Sex: Treating RN: 1923/12/19 (88 y.o. [name] Primary Care Keenan Trefry: Elenor Quinones Other Clinician: Referring Mouhamadou Gittleman: Treating Madge Therrien/Extender: Hartford Poli in Treatment: 14 Encounter Discharge Information Items Post Procedure Vitals Discharge Condition: Stable Temperature (F): 98.1 Ambulatory Status: Walker Pulse (bpm): 60 Discharge Destination: Home Respiratory Rate (breaths/min): 20 Transportation: Private Auto Blood Pressure (mmHg): 137/75 Accompanied By: daughter in law Schedule Follow-up Appointment: Yes Clinical Summary of Care: Electronic Signature(s) Signed: 01/26/2022 6:45:57 PM By: Deon Pilling RN, BSN Entered By: Deon Pilling on 01/26/2022 13:07:02 [name] ([id number]) [id number] Page 2 of 9 -------------------------------------------------------------------------------- Lower Extremity Assessment Details Patient Name: Date of Service: Vicki [name], Vicki [name]. 01/26/2022 1:00 PM Medical Record Number: [id number] Patient Account Number: [id number] Date of Birth/Sex: Treating RN: [birth year] (88 y.o. [name], [name] Primary Care Dejane Scheibe: Elenor Quinones Other Clinician: Referring Madolin Twaddle: Treating Heaven Meeker/Extender: Hartford Poli in Treatment: 14 Edema Assessment Assessed: Shirlyn Goltz: No] Patrice Paradise: Yes] Edema: [Left: N] [Right: o] Calf Left: Right: Point of Measurement: 28 cm From Medial Instep 33 cm Ankle Left: Right: Point of Measurement: 12 cm From Medial Instep 23 cm Vascular Assessment Pulses: Dorsalis Pedis Palpable: [Right:Yes] Electronic Signature(s) Signed: 01/26/2022 6:45:57 PM By: Deon Pilling RN, BSN Entered By: Deon Pilling on 01/26/2022 89:21:19 -------------------------------------------------------------------------------- Multi Wound Chart Details Patient Name: Date of Service: Vicki [name], Vicki [name]. 01/26/2022 1:00 PM Medical Record Number: [id number] Patient Account Number: [id number] Date of Birth/Sex: Treating RN: [birth year] (88 y.o. F) Primary Care Zaydin Billey: Elenor Quinones Other  Clinician: Referring Sender Rueb: Treating Alejo Beamer/Extender: Hartford Poli in Treatment: 14 Vital Signs Height(in): 62 Pulse(bpm): 60 Weight(lbs): 142 Blood Pressure(mmHg): 137/75 Body Mass Index(BMI): 26 Temperature(F): 98.1 Respiratory Rate(breaths/min): 20 [1:Photos:] [N/A:N/A] Right, Lateral Lower Leg N/A N/A Wound Location: Trauma N/A N/A Wounding Event: Abrasion N/A N/A Primary Etiology: Lymphedema N/A N/A Secondary Etiology: Congestive Heart Failure, N/A N/A Comorbid History: Hypertension, Osteoarthritis, Neuropathy 08/04/2021 N/A N/A Date Acquired: 14 N/A N/A Weeks of Treatment: Open N/A N/A Wound Status: No N/A N/A Wound Recurrence: 5.8x1.7x0.1 N/A N/A Measurements L x W x D (cm) 7.744 N/A N/A A (cm) :  rea 0.774 N/A N/A Volume (cm) : 85.50% N/A N/A % Reduction in A rea: 97.10% N/A N/A % Reduction in Volume: Full Thickness Without Exposed N/A N/A Classification: Support Structures Medium N/A N/A Exudate A mount: Serosanguineous N/A N/A Exudate Type: red, brown N/A N/A Exudate Color: Distinct, outline attached N/A N/A Wound Margin: Small (1-33%) N/A N/A Granulation A mount: Red, Pink N/A N/A Granulation Quality: Small (1-33%) N/A N/A Necrotic A mount: Fat Layer (Subcutaneous Tissue): Yes N/A N/A Exposed Structures: Fascia: No Tendon: No Muscle: No Joint: No Bone: No Large (67-100%) N/A N/A Epithelialization: Debridement - Excisional N/A N/A Debridement: Pre-procedure Verification/Time Out 13:00 N/A N/A Taken: Lidocaine 4% Topical Solution N/A N/A Pain Control: Subcutaneous, Slough N/A N/A Tissue Debrided: Skin/Subcutaneous Tissue N/A N/A Level: 9.86 N/A N/A Debridement A (sq cm): rea Curette N/A N/A Instrument: Minimum N/A N/A Bleeding: Pressure N/A N/A Hemostasis A chieved: 0 N/A N/A Procedural Pain: 0 N/A N/A Post Procedural Pain: Procedure was tolerated well N/A N/A Debridement  Treatment Response: 5.8x1.7x0.1 N/A N/A Post Debridement Measurements L x W x D (cm) 0.774 N/A N/A Post Debridement Volume: (cm) Scarring: Yes N/A N/A Periwound Skin Texture: Excoriation: No Induration: No Callus: No Crepitus: No Rash: No Maceration: No N/A N/A Periwound Skin Moisture: Dry/Scaly: No Hemosiderin Staining: Yes N/A N/A Periwound Skin Color: Atrophie Blanche: No Cyanosis: No Ecchymosis: No Erythema: No Mottled: No Pallor: No Rubor: No Debridement N/A N/A Procedures Performed: Treatment Notes Wound #1 (Lower Leg) Wound Laterality: Right, Lateral Cleanser Soap and Water Discharge Instruction: May shower and wash wound with dial antibacterial soap and water prior to dressing change. Wound Cleanser Discharge Instruction: Cleanse the wound with wound cleanser prior to applying a clean dressing using gauze sponges, not tissue or cotton balls. Peri-Wound Care Zinc Oxide Ointment 30g tube Discharge Instruction: Apply Zinc Oxide to periwound lightly around the periwound. Sween Lotion (Moisturizing lotion) Discharge Instruction: Apply moisturizing lotion as directed Vicki [name], Vicki [name] ([id number]) [id number] Page 4 of 9 Topical Gentamicin Discharge Instruction: add directly to wound bed. Mupirocin Ointment Discharge Instruction: Apply Mupirocin (Bactroban) as instructed Primary Dressing Hydrofera Blue Ready Foam, 2.5 x2.5 in Discharge Instruction: Apply to wound bed as instructed Secondary Dressing Zetuvit Plus 4x8 in Discharge Instruction: Apply over primary dressing as directed. Secured With Compression Wrap Kerlix Roll 4.5x3.1 (in/yd) Discharge Instruction: Apply Kerlix and Coban compression as directed. Coban Self-Adherent Wrap 4x5 (in/yd) Discharge Instruction: Apply over Kerlix as directed. Compression Stockings Add-Ons Electronic Signature(s) Signed: 01/26/2022 2:58:01 PM By: Kalman Shan DO Entered By: Kalman Shan on 01/26/2022 13:23:25 -------------------------------------------------------------------------------- Multi-Disciplinary Care Plan Details Patient Name: Date of Service: Vicki Rhymes [name]. 01/26/2022 1:00 PM Medical Record Number: [id number] Patient Account Number: [id number] Date of Birth/Sex: Treating RN: 08-15-1923 (88 y.o. [name], [name] Primary Care Rommie Dunn: Elenor Quinones Other Clinician: Referring Venna Berberich: Treating Dakotah Heiman/Extender: Hartford Poli in Treatment: 14 Active Inactive Nutrition Nursing Diagnoses: Potential for alteratiion in Nutrition/Potential for imbalanced nutrition Goals: Patient/caregiver agrees to and verbalizes understanding of need to obtain nutritional consultation Date Initiated: 10/20/2021 Target Resolution Date: 02/27/2022 Goal Status: Active Interventions: Provide education on nutrition Treatment Activities: Education provided on Nutrition : 01/19/2022 Patient referred to Primary Care Physician for further nutritional evaluation : 10/20/2021 Notes: Pain, Acute or Chronic Nursing Diagnoses: Pain, acute or chronic: actual or potential Vicki [name], Vicki [name] ([id number]) [id number] Page 5 of 9 Potential alteration in comfort, pain Goals: Patient will verbalize adequate pain control and receive pain control interventions during procedures as needed  Date Initiated: 10/20/2021 Target Resolution Date: 02/27/2022 Goal Status: Active Patient/caregiver will verbalize comfort level met Date Initiated: 10/20/2021 Date Inactivated: 11/04/2021 Target Resolution Date: 11/06/2021 Goal Status: Met Interventions: Assess comfort goal upon admission Provide education on pain management Treatment Activities: Administer pain control measures as ordered : 10/20/2021 Notes: Wound/Skin Impairment Nursing Diagnoses: Knowledge deficit related to ulceration/compromised skin  integrity Goals: Patient/caregiver will verbalize understanding of skin care regimen Date Initiated: 10/20/2021 Target Resolution Date: 02/27/2022 Goal Status: Active Interventions: Assess patient/caregiver ability to perform ulcer/skin care regimen upon admission and as needed Assess ulceration(s) every visit Provide education on ulcer and skin care Treatment Activities: Skin care regimen initiated : 10/20/2021 Topical wound management initiated : 10/20/2021 Notes: Electronic Signature(s) Signed: 01/26/2022 6:45:57 PM By: Deon Pilling RN, BSN Entered By: Deon Pilling on 01/26/2022 13:03:00 -------------------------------------------------------------------------------- Pain Assessment Details Patient Name: Date of Service: Vicki Rhymes [name]. 01/26/2022 1:00 PM Medical Record Number: [id number] Patient Account Number: [id number] Date of Birth/Sex: Treating RN: Jul 10, 1923 (88 y.o. [name] Primary Care Deondray Ospina: Elenor Quinones Other Clinician: Referring Heron Pitcock: Treating Constance Hackenberg/Extender: [place] Poli in Treatment: 14 Active Problems Location of Pain Severity and Description of Pain Patient Has Paino No Site Locations Rate the pain. Vicki [name], Vicki [name] ([id number]) [id number] Page 6 of 9 Rate the pain. Current Pain Level: 0 Pain Management and Medication Current Pain Management: Medication: No Cold Application: No Rest: No Massage: No Activity: No LewisE.N.S.: No Heat Application: No Leg drop or elevation: No Is the Current Pain Management Adequate: Adequate How does your wound impact your activities of daily livingo Sleep: No Bathing: No Appetite: No Relationship With Others: No Bladder Continence: No Emotions: No Bowel Continence: No Work: No Toileting: No Drive: No Dressing: No Hobbies: No Engineer, maintenance) Signed: 01/26/2022 6:45:57 PM By: Deon Pilling RN, BSN Entered By: Deon Pilling on 01/26/2022 12:53:50 -------------------------------------------------------------------------------- Patient/Caregiver Education Details Patient Name: Date of Service: Vicki [name] 11/27/2023andnbsp1:00 PM Medical Record Number: [id number] Patient Account Number: [id number] Date of Birth/Gender: Treating RN: Jul 18, 1923 (88 y.o. [name] Primary Care Physician: Elenor Quinones Other Clinician: Referring Physician: Treating Physician/Extender: [place] Poli in Treatment: 14 Education Assessment Education Provided To: Patient Education Topics Provided Wound/Skin Impairment: Handouts: Skin Care Do's and Dont's Methods: Explain/Verbal Responses: Reinforcements needed Electronic Signature(s) Signed: 01/26/2022 6:45:57 PM By: Deon Pilling RN, BSN Entered By: Deon Pilling on 01/26/2022 13:03:10 [name] ([id number]) [id number] Page 7 of 9 -------------------------------------------------------------------------------- Wound Assessment Details Patient Name: Date of Service: Vicki [name], Vicki [name]. 01/26/2022 1:00 PM Medical Record Number: [id number] Patient Account Number: [id number] Date of Birth/Sex: Treating RN: 1923/06/02 (88 y.o. [name], [name] Primary Care Gini Caputo: Elenor Quinones Other Clinician: Referring Tayja Manzer: Treating Luretta Everly/Extender: [place] Poli in Treatment: 14 Wound Status Wound Number: 1 Primary Etiology: Abrasion Wound Location: Right, Lateral Lower Leg Secondary Lymphedema Etiology: Wounding Event: Trauma Wound Status: Open Date Acquired: 08/04/2021 Comorbid Congestive Heart Failure, Hypertension, Osteoarthritis, Weeks Of Treatment: 14 History: Neuropathy Clustered Wound: No Photos Wound Measurements Length: (cm) 5 Width: (cm) 1 Depth: (cm) 0 Area: (cm) Volume: (cm) .8 % Reduction in Area: 85.5% .7 % Reduction in Volume:  97.1% .1 Epithelialization: Large (67-100%) 7.744 Tunneling: No 0.774 Undermining: No Wound Description Classification: Full Thickness Without Exposed Suppor Wound Margin: Distinct, outline attached Exudate Amount: Medium Exudate Type: Serosanguineous Exudate Color: red, brown [name] Structures Foul Odor After Cleansing: No Slough/Fibrino Yes Wound Bed Granulation Amount: Small (1-33%) Exposed Structure  Granulation Quality: Red, Pink Fascia Exposed: No Necrotic Amount: Small (1-33%) Fat Layer (Subcutaneous Tissue) Exposed: Yes Necrotic Quality: Adherent Slough Tendon Exposed: No Muscle Exposed: No Joint Exposed: No Bone Exposed: No Periwound Skin Texture Texture Color No Abnormalities Noted: No No Abnormalities Noted: No Callus: No Atrophie Blanche: No Crepitus: No Cyanosis: No Excoriation: No Ecchymosis: No Induration: No Erythema: No Rash: No Hemosiderin Staining: Yes Scarring: Yes Mottled: No Pallor: No Moisture Rubor: No No Abnormalities Noted: No Dry / Scaly: No Maceration: No CONTINA, Vicki [name] ([id number]) [id number] Page 8 of 9 Treatment Notes Wound #1 (Lower Leg) Wound Laterality: Right, Lateral Cleanser Soap and Water Discharge Instruction: May shower and wash wound with dial antibacterial soap and water prior to dressing change. Wound Cleanser Discharge Instruction: Cleanse the wound with wound cleanser prior to applying a clean dressing using gauze sponges, not tissue or cotton balls. Peri-Wound Care Zinc Oxide Ointment 30g tube Discharge Instruction: Apply Zinc Oxide to periwound lightly around the periwound. Sween Lotion (Moisturizing lotion) Discharge Instruction: Apply moisturizing lotion as directed Topical Gentamicin Discharge Instruction: add directly to wound bed. Mupirocin Ointment Discharge Instruction: Apply Mupirocin (Bactroban) as instructed Primary Dressing Hydrofera Blue Ready Foam, 2.5 x2.5 in Discharge  Instruction: Apply to wound bed as instructed Secondary Dressing Zetuvit Plus 4x8 in Discharge Instruction: Apply over primary dressing as directed. Secured With Compression Wrap Kerlix Roll 4.5x3.1 (in/yd) Discharge Instruction: Apply Kerlix and Coban compression as directed. Coban Self-Adherent Wrap 4x5 (in/yd) Discharge Instruction: Apply over Kerlix as directed. Compression Stockings Add-Ons Electronic Signature(s) Signed: 01/26/2022 6:45:57 PM By: Deon Pilling RN, BSN Entered By: Deon Pilling on 01/26/2022 12:58:10 -------------------------------------------------------------------------------- Vitals Details Patient Name: Date of Service: Vicki [name], Vicki Holler [name]. 01/26/2022 1:00 PM Medical Record Number: [id number] Patient Account Number: [id number] Date of Birth/Sex: Treating RN: 07-24-1923 (88 y.o. [name], [name] Primary Care Riona Lahti: Elenor Quinones Other Clinician: Referring Annie Saephan: Treating Rosanne Wohlfarth/Extender: [place] Poli in Treatment: 14 Vital Signs Time Taken: 12:53 Temperature (F): 98.1 Height (in): 62 Pulse (bpm): 60 Weight (lbs): 142 Respiratory Rate (breaths/min): 20 Body Mass Index (BMI): 26 Blood Pressure (mmHg): 137/75 Reference Range: 80 - 120 mg / dl MOET, MIKULSKI [name] ([id number]) [id number] Page 9 of 9 Electronic Signature(s) Signed: 01/26/2022 6:45:57 PM By: Deon Pilling RN, BSN Entered By: Deon Pilling on 01/26/2022 12:53:43

## 2022-02-02 ENCOUNTER — Encounter (HOSPITAL_BASED_OUTPATIENT_CLINIC_OR_DEPARTMENT_OTHER): Payer: Medicare Other | Attending: Internal Medicine | Admitting: Internal Medicine

## 2022-02-02 DIAGNOSIS — I872 Venous insufficiency (chronic) (peripheral): Secondary | ICD-10-CM | POA: Diagnosis not present

## 2022-02-02 DIAGNOSIS — I110 Hypertensive heart disease with heart failure: Secondary | ICD-10-CM | POA: Diagnosis not present

## 2022-02-02 DIAGNOSIS — I509 Heart failure, unspecified: Secondary | ICD-10-CM | POA: Diagnosis not present

## 2022-02-02 DIAGNOSIS — G629 Polyneuropathy, unspecified: Secondary | ICD-10-CM | POA: Insufficient documentation

## 2022-02-02 DIAGNOSIS — I87311 Chronic venous hypertension (idiopathic) with ulcer of right lower extremity: Secondary | ICD-10-CM | POA: Insufficient documentation

## 2022-02-02 DIAGNOSIS — M1990 Unspecified osteoarthritis, unspecified site: Secondary | ICD-10-CM | POA: Diagnosis not present

## 2022-02-02 DIAGNOSIS — L97812 Non-pressure chronic ulcer of other part of right lower leg with fat layer exposed: Secondary | ICD-10-CM | POA: Diagnosis present

## 2022-02-02 NOTE — Progress Notes (Signed)
Vicki [name] ([id number]) [id number] Page 1 of 10 Visit Report for 02/02/2022 Chief Complaint Document Details Patient Name: Date of Service: Vicki [name], Vicki [name]. 02/02/2022 1:00 PM Medical Record Number: [id number] Patient Account Number: [id number] Date of Birth/Sex: Treating RN: December 01, 1923 (87 y.o. F) Primary Care Provider: Pricilla Holm Other Clinician: Referring Provider: Treating Provider/Extender: Elicia Lamp in Treatment: 15 Information Obtained from: Patient Chief Complaint 10/20/2021; right lower extremity wound status post trauma Electronic Signature(s) Signed: 02/02/2022 3:20:41 PM By: Geralyn Corwin DO Entered By: Geralyn Corwin on 02/02/2022 13:21:50 -------------------------------------------------------------------------------- Debridement Details Patient Name: Date of Service: Vicki [name], Vicki [name]. 02/02/2022 1:00 PM Medical Record Number: [id number] Patient Account Number: [id number] Date of Birth/Sex: Treating RN: [birth year] (88 y.o. Vicki [name], [name] Primary Care Provider: Pricilla Holm Other Clinician: Referring Provider: Treating Provider/Extender: Elicia Lamp in Treatment: 15 Debridement Performed for Assessment: Wound #1 Right,Lateral Lower Leg Performed By: Physician Geralyn Corwin, DO Debridement Type: Debridement Level of Consciousness (Pre-procedure): Awake and Alert Pre-procedure Verification/Time Out Yes - 13:18 Taken: Start Time: 13:18 Pain Control: Lidocaine [name] Area Debrided (L x W): otal 5 (cm) x 1.2 (cm) = 6 (cm) Tissue and other material debrided: Viable, Non-Viable, Slough, Subcutaneous, Slough Level: Skin/Subcutaneous Tissue Debridement Description: Excisional Instrument: Curette Bleeding: Minimum Hemostasis Achieved: Pressure End Time: 13:18 Procedural Pain: 0 Post Procedural Pain: 0 Response to Treatment: Procedure was  tolerated well Level of Consciousness (Post- Awake and Alert procedure): Post Debridement Measurements of Total Wound Length: (cm) 5 Width: (cm) 1.2 Depth: (cm) 0.1 Volume: (cm) 0.471 Character of Wound/Ulcer Post Debridement: Improved Post Procedure Diagnosis Vicki [name], Vicki [name] ([id number]) [id number] Page 2 of 10 Same as Pre-procedure Electronic Signature(s) Signed: 02/02/2022 3:20:41 PM By: Geralyn Corwin DO Signed: 02/02/2022 3:46:46 PM By: Fonnie Mu RN Entered By: Fonnie Mu on 02/02/2022 13:18:46 -------------------------------------------------------------------------------- HPI Details Patient Name: Date of Service: Vicki [name], Vicki [name]. 02/02/2022 1:00 PM Medical Record Number: [id number] Patient Account Number: [id number] Date of Birth/Sex: Treating RN: 1923/09/17 (88 y.o. F) Primary Care Provider: Pricilla Holm Other Clinician: Referring Provider: Treating Provider/Extender: Elicia Lamp in Treatment: 15 History of Present Illness HPI Description: Admission 10/20/2021 [name] is a 88 year old female with a past medical history of hypothyroidism and essential hypertension that presents to the clinic for a 78-month history of ulcer to the right lower extremity. On 08/15/2021 the patient experienced a mechanical fall and hit her right leg against her table. She developed a hematoma and she was evaluated in the ED for this issue. The hematoma was evacuated in the ED. She had x-rays of the tibia/fibula and ankle that showed no acute osseous abnormalities. She has been using silver alginate to the wound bed. She did have 2 rounds of doxycycline for this issue and recently completed her second course. She currently denies signs of infection. 8/28; patient presents for follow-up. She has no issues or complaints today. We have been doing Santyl and Hydrofera Blue under Kerlix/Coban. She has home  health that they start coming out once weekly. She denies signs of infection. 9/5; patient presents for follow-up. We have been doing Hydrofera Blue with antibiotic ointment under Kerlix/Coban. She has no issues or complaints today. 9/11; patient presents for follow-up. We have been doing Hydrofera Blue under Kerlix/Coban. Home health has been changing the dressing weekly. The wrap was too tight at the more proximal and cutting into the patient's skin. Other than that she has no issues or complaints  today. 9/18; patient presents for follow-up. We have been using PolyMem silver under Kerlix/Coban for the past week. She has no issues or complaints today. 9/25; patient presents for follow-up. We have been using PolyMem silver under Kerlix/Coban. She has no issues or complaints today. 10/2; patient presents for follow-up. We have been using PolyMem silver with antibiotic ointment under compression therapy. Patient has no issues or complaints today. 10/9; patient presents for follow-up. We have been using PolyMem silver under compression therapy. Patient has no issues or complaints today. She reports improvement in wound healing. 10/16; patient presents for follow-up. We have been using PolyMem silver with antibiotic ointment under compression therapy. Patient has no issues or complaints today. 10/23; patient presents for follow-up. We have been using PolyMem silver with antibiotic ointment under Kerlix/Coban. She has no issues or complaints today. 10/30; patient presents for follow-up. We have been using PolyMem silver with antibiotic ointment under Kerlix/Coban. Her wound continues to show signs of healing. 11/13; patient presents for follow-up. We have been using PolyMem silver with antibiotic ointment under Kerlix/Coban. She has more hyper granulated tissue and nonviable tissue today. She denies signs of infection. 11/20; patient presents for follow-up. We have been using Hydrofera Blue with  antibiotic ointment under compression therapy. Patient states she is tolerating this well. She denies signs of infection. 11/27; patient presents for follow-up. We have been using Hydrofera Blue with antibiotic ointment under compression therapy. She has made significant improvement in wound healing over the past week. She has no issues or complaints today. 12/4; patient presents for follow-up. We have been using Hydrofera Blue with antibiotic under compression therapy. She continues to improve and her wound healing. She has no issues or complaints today. Electronic Signature(s) Signed: 02/02/2022 3:20:41 PM By: Geralyn Corwin DO Entered By: Geralyn Corwin on 02/02/2022 13:22:21 Vicki [name] ([id number]) [id number] Page 3 of 10 -------------------------------------------------------------------------------- Physical Exam Details Patient Name: Date of Service: ZULEYMA, SCHARF [name]. 02/02/2022 1:00 PM Medical Record Number: [id number] Patient Account Number: [id number] Date of Birth/Sex: Treating RN: 1923/05/30 (88 y.o. F) Primary Care Provider: Pricilla Holm Other Clinician: Referring Provider: Treating Provider/Extender: Elicia Lamp in Treatment: 15 Constitutional respirations regular, non-labored and within target range for patient.. Cardiovascular 2+ dorsalis pedis/posterior tibialis pulses. Psychiatric pleasant and cooperative. Notes Right lower extremity: [name] the anterior aspect there is an open wound with granulation tissue and Slough. No surrounding signs of infection. Venous stasis o dermatitis. Good edema control. Electronic Signature(s) Signed: 02/02/2022 3:20:41 PM By: Geralyn Corwin DO Entered By: Geralyn Corwin on 02/02/2022 13:22:49 -------------------------------------------------------------------------------- Physician Orders Details Patient Name: Date of Service: Vicki Presume [name]. 02/02/2022  1:00 PM Medical Record Number: [id number] Patient Account Number: [id number] Date of Birth/Sex: Treating RN: [birth year] (88 y.o. Vicki [name], [name] Primary Care Provider: Pricilla Holm Other Clinician: Referring Provider: Treating Provider/Extender: Elicia Lamp in Treatment: 15 Verbal / Phone Orders: No Diagnosis Coding Follow-up Appointments ppointment in 1 week. - w/ Dr. Mikey Bussing Return A ppointment in 2 weeks. - Dr. Mikey Bussing Return A Anesthetic (In clinic) Topical Lidocaine 5% applied to wound bed (In clinic) Topical Lidocaine 4% applied to wound bed Bathing/ Shower/ Hygiene May shower with protection but do not get wound dressing(s) wet. Edema Control - Lymphedema / SCD / Other Elevate legs to the level of the heart or above for 30 minutes daily and/or when sitting, a frequency of: - 3-4 times a day throughout the day. Avoid standing for long periods of  time. Exercise regularly Non Wound Condition Other Non Wound Condition Orders/Instructions: - Put xeroform to small skin tear right above wound for protection Wound Treatment Wound #1 - Lower Leg Wound Laterality: Right, Lateral Cleanser: Soap and Water 1 x Per Week/30 Days Discharge Instructions: May shower and wash wound with dial antibacterial soap and water prior to dressing change. ENYA, BUREAU ([id number]) [id number] Page 4 of 10 Cleanser: Wound Cleanser 1 x Per Week/30 Days Discharge Instructions: Cleanse the wound with wound cleanser prior to applying a clean dressing using gauze sponges, not tissue or cotton balls. Peri-Wound Care: Zinc Oxide Ointment 30g tube 1 x Per Week/30 Days Discharge Instructions: Apply Zinc Oxide to periwound lightly around the periwound. Peri-Wound Care: Sween Lotion (Moisturizing lotion) 1 x Per Week/30 Days Discharge Instructions: Apply moisturizing lotion as directed Topical: Gentamicin 1 x Per Week/30 Days Discharge  Instructions: add directly to wound bed. Topical: Mupirocin Ointment 1 x Per Week/30 Days Discharge Instructions: Apply Mupirocin (Bactroban) as instructed Prim Dressing: Hydrofera Blue Ready Foam, 2.5 x2.5 in 1 x Per Week/30 Days ary Discharge Instructions: Apply to wound bed as instructed Secondary Dressing: Zetuvit Plus 4x8 in 1 x Per Week/30 Days Discharge Instructions: Apply over primary dressing as directed. Compression Wrap: Kerlix Roll 4.5x3.1 (in/yd) (Home Health) 1 x Per Week/30 Days Discharge Instructions: Apply Kerlix and Coban compression as directed. Compression Wrap: Coban Self-Adherent Wrap 4x5 (in/yd) (Home Health) 1 x Per Week/30 Days Discharge Instructions: Apply over Kerlix as directed. Electronic Signature(s) Signed: 02/02/2022 3:20:41 PM By: Geralyn Corwin DO Entered By: Geralyn Corwin on 02/02/2022 13:22:57 -------------------------------------------------------------------------------- Problem List Details Patient Name: Date of Service: Vicki Presume [name]. 02/02/2022 1:00 PM Medical Record Number: [id number] Patient Account Number: [id number] Date of Birth/Sex: Treating RN: [birth year] (88 y.o. 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 02/02/2022 1:00 PM Medical Record Number: [id number] Patient Account Number: [id number] Date of Birth/Sex: Treating RN: 26-Aug-1923 (88 y.o. F) Primary Care Provider: Pricilla Holm Other Clinician: Referring Provider: Treating Provider/Extender: Elicia Lamp in Treatment: 15 Subjective Chief Complaint Information obtained from Patient 10/20/2021; right lower extremity wound status post trauma History of Present Illness (HPI) Admission 10/20/2021 Ms. Ellanore Vanhook is a 88 year old female with a past medical history of hypothyroidism and essential hypertension that presents to the clinic for a 40-month history of ulcer to the right lower extremity. On 08/15/2021 the patient experienced a mechanical fall and hit her right leg against her table. She developed a hematoma and she was evaluated in the ED for this issue. The hematoma was evacuated in the ED. She had x-rays of the tibia/fibula and ankle that showed no acute osseous abnormalities. She has been using silver alginate to the wound bed. She did have 2 rounds of doxycycline for this issue and recently completed her second course.  She currently denies signs of infection. 8/28; patient presents for follow-up. She has no issues or complaints today. We have been doing Santyl and Hydrofera Blue under Kerlix/Coban. She has home health that they start coming out once weekly. She denies signs of infection. 9/5; patient presents for follow-up. We have been doing Hydrofera Blue with antibiotic ointment under Kerlix/Coban. She has no issues or complaints today. 9/11; patient presents for follow-up. We have been doing Hydrofera Blue under Kerlix/Coban. Home health has been changing the dressing weekly. The  wrap was too tight at the more proximal and cutting into the patient's skin. Other than that she has no issues or complaints today. 9/18; patient presents for follow-up. We have been using PolyMem silver under Kerlix/Coban for the past week. She has no issues or complaints today. 9/25; patient presents for follow-up. We have been using PolyMem silver under Kerlix/Coban. She has no issues or complaints today. 10/2; patient presents for follow-up. We have been using PolyMem silver with antibiotic ointment under compression therapy. Patient has no issues or complaints today. 10/9; patient presents for follow-up. We have been using PolyMem silver under compression therapy. Patient has no issues or complaints today. She reports improvement in wound healing. 10/16; patient presents for follow-up. We have been using PolyMem silver with antibiotic ointment under compression therapy. Patient has no issues or complaints today. 10/23; patient presents for follow-up. We have been using PolyMem silver with antibiotic ointment under Kerlix/Coban. She has no issues or complaints today. 10/30; patient presents for follow-up. We have been using PolyMem silver with antibiotic ointment under Kerlix/Coban. Her wound continues to show signs of healing. 11/13; patient presents for follow-up. We have been using PolyMem silver with antibiotic ointment under Kerlix/Coban. She has more hyper granulated tissue and nonviable tissue today. She denies signs of infection. 11/20; patient presents for follow-up. We have been using Hydrofera Blue with antibiotic ointment under compression therapy. Patient states she is tolerating this well. She denies signs of infection. 11/27; patient presents for follow-up. We have been using Hydrofera Blue with antibiotic ointment under compression therapy. She has made significant improvement in wound healing over the past week. She has no issues or complaints today. 12/4; patient presents for  follow-up. We have been using Hydrofera Blue with antibiotic under compression therapy. She continues to improve and her wound healing. She has no issues or complaints today. Patient History Information obtained from Patient. Family History Diabetes - Child, Heart Disease - Child,Mother,Father,Siblings, Hypertension - Father,Mother, No family history of Cancer, Hereditary Spherocytosis, Kidney Disease, Lung Disease, Seizures, Stroke, Thyroid Problems, Tuberculosis. Social History FELISA, ZECHMAN ([id number]) [id number] Page 6 of 10 Never smoker, Marital Status - Widowed, Alcohol Use - Never, Drug Use - No History, Caffeine Use - Rarely. Medical History Eyes Denies history of Cataracts, Glaucoma, Optic Neuritis Ear/Nose/Mouth/Throat Denies history of Chronic sinus problems/congestion, Middle ear problems Hematologic/Lymphatic Denies history of Anemia, Hemophilia, Human Immunodeficiency Virus, Lymphedema, Sickle Cell Disease Respiratory Denies history of Aspiration, Asthma, Chronic Obstructive Pulmonary Disease (COPD), Pneumothorax, Sleep Apnea, Tuberculosis Cardiovascular Patient has history of Congestive Heart Failure, Hypertension Denies history of Angina, Arrhythmia, Coronary Artery Disease, Deep Vein Thrombosis, Hypotension, Myocardial Infarction, Peripheral Arterial Disease, Peripheral Venous Disease, Phlebitis, Vasculitis Gastrointestinal Denies history of Cirrhosis , Colitis, Crohnoos, Hepatitis A, Hepatitis B, Hepatitis C Endocrine Denies history of Type I Diabetes, Type II Diabetes Immunological Denies history of Lupus Erythematosus, Raynaudoos, Scleroderma Integumentary (Skin) Denies history of History of Burn Musculoskeletal Patient has history of Osteoarthritis Denies history of  Gout, Rheumatoid Arthritis, Osteomyelitis Neurologic Patient has history of Neuropathy Denies history of Dementia, Quadriplegia, Paraplegia, Seizure  Disorder Psychiatric Denies history of Anorexia/bulimia, Confinement Anxiety Hospitalization/Surgery History - back surgery. - right hip surgery. - hysterotomy. Medical A Surgical History Notes nd Ear/Nose/Mouth/Throat hearing aides Genitourinary Stage three Objective Constitutional respirations regular, non-labored and within target range for patient.. Vitals Time Taken: 1:11 PM, Height: 62 in, Weight: 142 lbs, BMI: 26, Temperature: 97.9 F, Pulse: 71 bpm, Respiratory Rate: 20 breaths/min, Blood Pressure: 128/81 mmHg. Cardiovascular 2+ dorsalis pedis/posterior tibialis pulses. Psychiatric pleasant and cooperative. General Notes: Right lower extremity: [name] the anterior aspect there is an open wound with granulation tissue and Slough. No surrounding signs of infection. o Venous stasis dermatitis. Good edema control. Integumentary (Hair, Skin) Wound #1 status is Open. Original cause of wound was Trauma. The date acquired was: 08/04/2021. The wound has been in treatment 15 weeks. The wound is located on the Right,Lateral Lower Leg. The wound measures 5cm length x 1.2cm width x 0.1cm depth; 4.712cm^2 area and 0.471cm^3 volume. There is Fat Layer (Subcutaneous Tissue) exposed. There is no tunneling or undermining noted. There is a medium amount of serosanguineous drainage noted. The wound margin is distinct with the outline attached to the wound base. There is large (67-100%) red, pink granulation within the wound bed. There is a small (1-33%) amount of necrotic tissue within the wound bed including Adherent Slough. The periwound skin appearance exhibited: Scarring, Hemosiderin Staining. The periwound skin appearance did not exhibit: Callus, Crepitus, Excoriation, Induration, Rash, Dry/Scaly, Maceration, Atrophie Blanche, Cyanosis, Ecchymosis, Mottled, Pallor, Rubor, Erythema. Assessment Active Problems ICD-10 Non-pressure chronic ulcer of other part of right lower leg with fat layer  exposed Other early complications of trauma, initial encounter Hypothyroidism, unspecified Chronic venous hypertension (idiopathic) with ulcer of right lower extremity Vicki [name], Vicki [name] ([id number]) [id number] Page 7 of 10 Patient's wound has shown improvement in size and appearance since last clinic visit. I debrided nonviable tissue. I recommended continuing with antibiotic ointment and Hydrofera Blue under compression therapy. Follow-up in 1 week. Procedures Wound #1 Pre-procedure diagnosis of Wound #1 is an Abrasion located on the Right,Lateral Lower Leg . There was a Excisional Skin/Subcutaneous Tissue Debridement with a total area of 6 sq cm performed by Geralyn CorwinHoffman, Anabelen Kaminsky, DO. With the following instrument(s): Curette to remove Viable and Non-Viable tissue/material. Material removed includes Subcutaneous Tissue and Slough and after achieving pain control using Lidocaine. No specimens were taken. A time out was conducted at 13:18, prior to the start of the procedure. A Minimum amount of bleeding was controlled with Pressure. The procedure was tolerated well with a pain level of 0 throughout and a pain level of 0 following the procedure. Post Debridement Measurements: 5cm length x 1.2cm width x 0.1cm depth; 0.471cm^3 volume. Character of Wound/Ulcer Post Debridement is improved. Post procedure Diagnosis Wound #1: Same as Pre-Procedure Plan Follow-up Appointments: Return Appointment in 1 week. - w/ Dr. Mikey BussingHoffman Return Appointment in 2 weeks. - Dr. Mikey BussingHoffman Anesthetic: (In clinic) Topical Lidocaine 5% applied to wound bed (In clinic) Topical Lidocaine 4% applied to wound bed Bathing/ Shower/ Hygiene: May shower with protection but do not get wound dressing(s) wet. Edema Control - Lymphedema / SCD / Other: Elevate legs to the level of the heart or above for 30 minutes daily and/or when sitting, a frequency of: - 3-4 times a day throughout the day. Avoid standing  for long periods of time. Exercise regularly Non Wound Condition: Other Non Wound Condition Orders/Instructions: -  Put xeroform to small skin tear right above wound for protection WOUND #1: - Lower Leg Wound Laterality: Right, Lateral Cleanser: Soap and Water 1 x Per Week/30 Days Discharge Instructions: May shower and wash wound with dial antibacterial soap and water prior to dressing change. Cleanser: Wound Cleanser 1 x Per Week/30 Days Discharge Instructions: Cleanse the wound with wound cleanser prior to applying a clean dressing using gauze sponges, not tissue or cotton balls. Peri-Wound Care: Zinc Oxide Ointment 30g tube 1 x Per Week/30 Days Discharge Instructions: Apply Zinc Oxide to periwound lightly around the periwound. Peri-Wound Care: Sween Lotion (Moisturizing lotion) 1 x Per Week/30 Days Discharge Instructions: Apply moisturizing lotion as directed Topical: Gentamicin 1 x Per Week/30 Days Discharge Instructions: add directly to wound bed. Topical: Mupirocin Ointment 1 x Per Week/30 Days Discharge Instructions: Apply Mupirocin (Bactroban) as instructed Prim Dressing: Hydrofera Blue Ready Foam, 2.5 x2.5 in 1 x Per Week/30 Days ary Discharge Instructions: Apply to wound bed as instructed Secondary Dressing: Zetuvit Plus 4x8 in 1 x Per Week/30 Days Discharge Instructions: Apply over primary dressing as directed. Com pression Wrap: Kerlix Roll 4.5x3.1 (in/yd) (Home Health) 1 x Per Week/30 Days Discharge Instructions: Apply Kerlix and Coban compression as directed. Com pression Wrap: Coban Self-Adherent Wrap 4x5 (in/yd) (Home Health) 1 x Per Week/30 Days Discharge Instructions: Apply over Kerlix as directed. 1. In office sharp debridement 2. Antibiotic ointment with Hydrofera Blue under Kerlix/Coban 3. Follow-up in 1 week Electronic Signature(s) Signed: 02/02/2022 3:20:41 PM By: Geralyn Corwin DO Entered By: Geralyn Corwin on 02/02/2022 13:23:50 Vicki [name] ([id number])  [id number] Page 8 of 10 -------------------------------------------------------------------------------- HxROS Details Patient Name: Date of Service: Vicki [name], RHYMES. 02/02/2022 1:00 PM Medical Record Number: [id number] Patient Account Number: [id number] Date of Birth/Sex: Treating RN: August 07, 1923 (88 y.o. F) Primary Care Provider: Pricilla Holm Other Clinician: Referring Provider: Treating Provider/Extender: Elicia Lamp in Treatment: 15 Information Obtained From Patient Eyes Medical History: Negative for: Cataracts; Glaucoma; Optic Neuritis Ear/Nose/Mouth/Throat Medical History: Negative for: Chronic sinus problems/congestion; Middle ear problems Past Medical History Notes: hearing aides Hematologic/Lymphatic Medical History: Negative for: Anemia; Hemophilia; Human Immunodeficiency Virus; Lymphedema; Sickle Cell Disease Respiratory Medical History: Negative for: Aspiration; Asthma; Chronic Obstructive Pulmonary Disease (COPD); Pneumothorax; Sleep Apnea; Tuberculosis Cardiovascular Medical History: Positive for: Congestive Heart Failure; Hypertension Negative for: Angina; Arrhythmia; Coronary Artery Disease; Deep Vein Thrombosis; Hypotension; Myocardial Infarction; Peripheral Arterial Disease; Peripheral Venous Disease; Phlebitis; Vasculitis Gastrointestinal Medical History: Negative for: Cirrhosis ; Colitis; Crohns; Hepatitis A; Hepatitis B; Hepatitis C Endocrine Medical History: Negative for: Type I Diabetes; Type II Diabetes Genitourinary Medical History: Past Medical History Notes: Stage three Immunological Medical History: Negative for: Lupus Erythematosus; Raynauds; Scleroderma Integumentary (Skin) Medical History: Negative for: History of Burn Musculoskeletal Medical History: Positive for: Osteoarthritis Negative for: Gout; Rheumatoid Arthritis; Osteomyelitis Neurologic Medical  History: Positive for: Neuropathy Negative for: Dementia; Quadriplegia; Paraplegia; Seizure Disorder Vicki [name], Vicki [name] ([id number]) [id number] Page 9 of 10 Psychiatric Medical History: Negative for: Anorexia/bulimia; Confinement Anxiety Immunizations Pneumococcal Vaccine: Received Pneumococcal Vaccination: Yes Received Pneumococcal Vaccination On or After 60th Birthday: Yes Implantable Devices Yes Hospitalization / Surgery History Type of Hospitalization/Surgery back surgery right hip surgery hysterotomy Family and Social History Cancer: No; Diabetes: Yes - Child; Heart Disease: Yes - Child,Mother,Father,Siblings; Hereditary Spherocytosis: No; Hypertension: Yes - Father,Mother; Kidney Disease: No; Lung Disease: No; Seizures: No; Stroke: No; Thyroid Problems: No; Tuberculosis: No; Never smoker; Marital Status - Widowed; Alcohol Use: Never; Drug Use: No History; Caffeine Use:  Rarely; Financial Concerns: No; Food, Clothing or Shelter Needs: No; Support System Lacking: No; Transportation Concerns: No Electronic Signature(s) Signed: 02/02/2022 3:20:41 PM By: Geralyn Corwin DO Entered By: Geralyn Corwin on 02/02/2022 13:22:27 -------------------------------------------------------------------------------- SuperBill Details Patient Name: Date of Service: Vicki Presume [name]. 02/02/2022 Medical Record Number: [id number] Patient Account Number: [id number] Date of Birth/Sex: Treating RN: 08-12-1923 (88 y.o. Vicki [name], [name] Primary Care Provider: Pricilla Holm Other Clinician: Referring Provider: Treating Provider/Extender: Elicia Lamp in Treatment: 15 Diagnosis Coding ICD-10 Codes Code Description [phone] Non-pressure chronic ulcer of other part of right lower leg with fat layer exposed T79.8XXA Other early complications of trauma, initial encounter E03.9 Hypothyroidism, unspecified I87.311 Chronic venous  hypertension (idiopathic) with ulcer of right lower extremity Facility Procedures : CPT4 Code: [id number] Description: 11042 - DEB SUBQ TISSUE 20 SQ CM/< ICD-10 Diagnosis Description L97.812 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

## 2022-02-02 NOTE — Progress Notes (Signed)
Vicki, [name] ([id number]) [id number] Page 1 of 9 Visit Report for 02/02/2022 Arrival Information Details Patient Name: Date of Service: Vicki [name], Vicki T. 02/02/2022 1:00 PM Medical Record Number: [id number] Patient Account Number: [id number] Date of Birth/Sex: Treating RN: Sep 08, 1923 (88 y.o. [name], [name] Primary Care Kanishk Stroebel: Elenor Quinones Other Clinician: Referring Shrinika Blatz: Treating Alannis Hsia/Extender: [place] Poli in Treatment: 15 Visit Information History Since Last Visit Added or deleted any medications: No Patient Arrived: Walker Any new allergies or adverse reactions: No Arrival Time: 12:55 Had a fall or experienced change in No Accompanied By: daughter in law activities of daily living that may affect Transfer Assistance: None risk of falls: Patient Identification Verified: Yes Signs or symptoms of abuse/neglect since last visito No Secondary Verification Process Completed: Yes Hospitalized since last visit: No Patient Requires Transmission-Based Precautions: No Implantable device outside of the clinic excluding No Patient Has Alerts: No cellular tissue based products placed in the center since last visit: Has Dressing in Place as Prescribed: Yes Has Compression in Place as Prescribed: Yes Pain Present Now: No Electronic Signature(s) Signed: 02/02/2022 4:59:17 PM By: Deon Pilling RN, BSN Entered By: Deon Pilling on 02/02/2022 13:04:49 -------------------------------------------------------------------------------- Encounter Discharge Information Details Patient Name: Date of Service: Vicki [name], Vicki Holler T. 02/02/2022 1:00 PM Medical Record Number: [id number] Patient Account Number: [id number] Date of Birth/Sex: Treating RN: January 30, 1924 (88 y.o. [name], [name] Primary Care [name]: Elenor Quinones Other Clinician: Referring [name]: Treating Mekai Wilkinson/Extender: [place] Poli in Treatment: 15 Encounter Discharge Information Items Post Procedure Vitals Discharge Condition: Stable Temperature (F): 98.7 Ambulatory Status: Ambulatory Pulse (bpm): 74 Discharge Destination: Home Respiratory Rate (breaths/min): 17 Transportation: Private Auto Blood Pressure (mmHg): 124/74 Accompanied By: daughter Schedule Follow-up Appointment: Yes Clinical Summary of Care: Patient Declined Electronic Signature(s) Signed: 02/02/2022 3:46:46 PM By: Rhae Hammock RN Entered By: Rhae Hammock on 02/02/2022 13:20:26 [name] ([id number]) [id number] Page 2 of 9 -------------------------------------------------------------------------------- Lower Extremity Assessment Details Patient Name: Date of Service: Vicki, [name] 02/02/2022 1:00 PM Medical Record Number: [id number] Patient Account Number: [id number] Date of Birth/Sex: Treating RN: [birth year] (88 y.o. [name], [name] Primary Care Mayjor Ager: Elenor Quinones Other Clinician: Referring Tykel Badie: Treating Elsie Baynes/Extender: [place] Poli in Treatment: 15 Edema Assessment Assessed: Shirlyn Goltz: No] Patrice Paradise: Yes] Edema: [Left: N] [Right: o] Calf Left: Right: Point of Measurement: 28 cm From Medial Instep 33 cm Ankle Left: Right: Point of Measurement: 12 cm From Medial Instep 21 cm Vascular Assessment Pulses: Dorsalis Pedis Palpable: [Right:Yes] Electronic Signature(s) Signed: 02/02/2022 4:59:17 PM By: Deon Pilling RN, BSN Entered By: Deon Pilling on 02/02/2022 13:05:42 -------------------------------------------------------------------------------- Multi Wound Chart Details Patient Name: Date of Service: Vicki [name], Vicki Holler T. 02/02/2022 1:00 PM Medical Record Number: [id number] Patient Account Number: [id number] Date of Birth/Sex: Treating RN: 05-11-1923 (88 y.o. F) Primary Care [name]: Elenor Quinones Other  Clinician: Referring [name]: Treating [name]/Extender: [place] Poli in Treatment: 15 Vital Signs Height(in): 62 Pulse(bpm): 71 Weight(lbs): 142 Blood Pressure(mmHg): 128/81 Body Mass Index(BMI): 26 Temperature(F): 97.9 Respiratory Rate(breaths/min): 20 [1:Photos:] [N/A:N/A] Right, Lateral Lower Leg N/A N/A Wound Location: Trauma N/A N/A Wounding Event: Abrasion N/A N/A Primary Etiology: Lymphedema N/A N/A Secondary Etiology: Congestive Heart Failure, N/A N/A Comorbid History: Hypertension, Osteoarthritis, Neuropathy 08/04/2021 N/A N/A Date Acquired: 15 N/A N/A Weeks of Treatment: Open N/A N/A Wound Status: No N/A N/A Wound Recurrence: 5x1.2x0.1 N/A N/A Measurements L x W x D (cm) 4.712 N/A N/A A (cm) :  rea 0.471 N/A N/A Volume (cm) : 91.20% N/A N/A % Reduction in A rea: 98.20% N/A N/A % Reduction in Volume: Full Thickness Without Exposed N/A N/A Classification: Support Structures Medium N/A N/A Exudate A mount: Serosanguineous N/A N/A Exudate Type: red, brown N/A N/A Exudate Color: Distinct, outline attached N/A N/A Wound Margin: Large (67-100%) N/A N/A Granulation A mount: Red, Pink N/A N/A Granulation Quality: Small (1-33%) N/A N/A Necrotic A mount: Fat Layer (Subcutaneous Tissue): Yes N/A N/A Exposed Structures: Fascia: No Tendon: No Muscle: No Joint: No Bone: No Large (67-100%) N/A N/A Epithelialization: Debridement - Excisional N/A N/A Debridement: Pre-procedure Verification/Time Out 13:18 N/A N/A Taken: Lidocaine N/A N/A Pain Control: Subcutaneous, Slough N/A N/A Tissue Debrided: Skin/Subcutaneous Tissue N/A N/A Level: 6 N/A N/A Debridement A (sq cm): rea Curette N/A N/A Instrument: Minimum N/A N/A Bleeding: Pressure N/A N/A Hemostasis A chieved: 0 N/A N/A Procedural Pain: 0 N/A N/A Post Procedural Pain: Procedure was tolerated well N/A N/A Debridement Treatment  Response: 5x1.2x0.1 N/A N/A Post Debridement Measurements L x W x D (cm) 0.471 N/A N/A Post Debridement Volume: (cm) Scarring: Yes N/A N/A Periwound Skin Texture: Excoriation: No Induration: No Callus: No Crepitus: No Rash: No Maceration: No N/A N/A Periwound Skin Moisture: Dry/Scaly: No Hemosiderin Staining: Yes N/A N/A Periwound Skin Color: Atrophie Blanche: No Cyanosis: No Ecchymosis: No Erythema: No Mottled: No Pallor: No Rubor: No Debridement N/A N/A Procedures Performed: Treatment Notes Wound #1 (Lower Leg) Wound Laterality: Right, Lateral Cleanser Soap and Water Discharge Instruction: May shower and wash wound with dial antibacterial soap and water prior to dressing change. Wound Cleanser Discharge Instruction: Cleanse the wound with wound cleanser prior to applying a clean dressing using gauze sponges, not tissue or cotton balls. Peri-Wound Care Zinc Oxide Ointment 30g tube Discharge Instruction: Apply Zinc Oxide to periwound lightly around the periwound. Sween Lotion (Moisturizing lotion) Discharge Instruction: Apply moisturizing lotion as directed KALLA, WATSON ([id number]) [id number] Page 4 of 9 Topical Gentamicin Discharge Instruction: add directly to wound bed. Mupirocin Ointment Discharge Instruction: Apply Mupirocin (Bactroban) as instructed Primary Dressing Hydrofera Blue Ready Foam, 2.5 x2.5 in Discharge Instruction: Apply to wound bed as instructed Secondary Dressing Zetuvit Plus 4x8 in Discharge Instruction: Apply over primary dressing as directed. Secured With Compression Wrap Kerlix Roll 4.5x3.1 (in/yd) Discharge Instruction: Apply Kerlix and Coban compression as directed. Coban Self-Adherent Wrap 4x5 (in/yd) Discharge Instruction: Apply over Kerlix as directed. Compression Stockings Add-Ons Electronic Signature(s) Signed: 02/02/2022 3:20:41 PM By: Kalman Shan DO Entered By: Kalman Shan on  02/02/2022 13:21:41 -------------------------------------------------------------------------------- Multi-Disciplinary Care Plan Details Patient Name: Date of Service: Seleta Rhymes T. 02/02/2022 1:00 PM Medical Record Number: [id number] Patient Account Number: [id number] Date of Birth/Sex: Treating RN: October 24, 1923 (88 y.o. [name], [name] Primary Care Allix Blomquist: Elenor Quinones Other Clinician: Referring Tayonna Bacha: Treating Kailan Carmen/Extender: [place] Poli in Treatment: 15 Active Inactive Nutrition Nursing Diagnoses: Potential for alteratiion in Nutrition/Potential for imbalanced nutrition Goals: Patient/caregiver agrees to and verbalizes understanding of need to obtain nutritional consultation Date Initiated: 10/20/2021 Target Resolution Date: 02/27/2022 Goal Status: Active Interventions: Provide education on nutrition Treatment Activities: Education provided on Nutrition : 01/12/2022 Patient referred to Primary Care Physician for further nutritional evaluation : 10/20/2021 Notes: Pain, Acute or Chronic Nursing Diagnoses: Pain, acute or chronic: actual or potential ADAIRA, CENTOLA ([id number]) [id number] Page 5 of 9 Potential alteration in comfort, pain Goals: Patient will verbalize adequate pain control and receive pain control interventions during procedures as needed Date Initiated: 10/20/2021  Target Resolution Date: 02/27/2022 Goal Status: Active Patient/caregiver will verbalize comfort level met Date Initiated: 10/20/2021 Date Inactivated: 11/04/2021 Target Resolution Date: 11/06/2021 Goal Status: Met Interventions: Assess comfort goal upon admission Provide education on pain management Treatment Activities: Administer pain control measures as ordered : 10/20/2021 Notes: Wound/Skin Impairment Nursing Diagnoses: Knowledge deficit related to ulceration/compromised skin integrity Goals: Patient/caregiver  will verbalize understanding of skin care regimen Date Initiated: 10/20/2021 Target Resolution Date: 02/27/2022 Goal Status: Active Interventions: Assess patient/caregiver ability to perform ulcer/skin care regimen upon admission and as needed Assess ulceration(s) every visit Provide education on ulcer and skin care Treatment Activities: Skin care regimen initiated : 10/20/2021 Topical wound management initiated : 10/20/2021 Notes: Electronic Signature(s) Signed: 02/02/2022 3:46:46 PM By: Rhae Hammock RN Entered By: Rhae Hammock on 02/02/2022 13:08:00 -------------------------------------------------------------------------------- Pain Assessment Details Patient Name: Date of Service: Seleta Rhymes T. 02/02/2022 1:00 PM Medical Record Number: [id number] Patient Account Number: [id number] Date of Birth/Sex: Treating RN: 03/23/1923 (88 y.o. [name] Primary Care Mickey Hebel: Elenor Quinones Other Clinician: Referring Kearstyn Avitia: Treating Bindi Klomp/Extender: [place] Poli in Treatment: 15 Active Problems Location of Pain Severity and Description of Pain Patient Has Paino No Site Locations Rate the pain. BLEN, RANSOME ([id number]) [id number] Page 6 of 9 Rate the pain. Current Pain Level: 0 Pain Management and Medication Current Pain Management: Medication: No Cold Application: No Rest: No Massage: No Activity: No LewisE.N.S.: No Heat Application: No Leg drop or elevation: No Is the Current Pain Management Adequate: Adequate How does your wound impact your activities of daily livingo Sleep: No Bathing: No Appetite: No Relationship With Others: No Bladder Continence: No Emotions: No Bowel Continence: No Work: No Toileting: No Drive: No Dressing: No Hobbies: No Engineer, maintenance) Signed: 02/02/2022 4:59:17 PM By: Deon Pilling RN, BSN Entered By: Deon Pilling on 02/02/2022  13:05:25 -------------------------------------------------------------------------------- Patient/Caregiver Education Details Patient Name: Date of Service: Sheliah Hatch 12/4/2023andnbsp1:00 PM Medical Record Number: [id number] Patient Account Number: [id number] Date of Birth/Gender: Treating RN: 1924-01-11 (88 y.o. Benjaman Lobe Primary Care Physician: Elenor Quinones Other Clinician: Referring Physician: Treating Physician/Extender: [place] Poli in Treatment: 15 Education Assessment Education Provided To: Patient Education Topics Provided Nutrition: Methods: Explain/Verbal Responses: State content correctly Electronic Signature(s) Signed: 02/02/2022 3:46:46 PM By: Rhae Hammock RN Entered By: Rhae Hammock on 02/02/2022 13:08:12 [name] ([id number]) [id number] Page 7 of 9 -------------------------------------------------------------------------------- Wound Assessment Details Patient Name: Date of Service: MEGHANA, TULLO. 02/02/2022 1:00 PM Medical Record Number: [id number] Patient Account Number: [id number] Date of Birth/Sex: Treating RN: January 17, 1924 (88 y.o. [name], [name] Primary Care Ashira Kirsten: Elenor Quinones Other Clinician: Referring Raenell Mensing: Treating Averleigh Savary/Extender: [place] Poli in Treatment: 15 Wound Status Wound Number: 1 Primary Etiology: Abrasion Wound Location: Right, Lateral Lower Leg Secondary Lymphedema Etiology: Wounding Event: Trauma Wound Status: Open Date Acquired: 08/04/2021 Comorbid Congestive Heart Failure, Hypertension, Osteoarthritis, Weeks Of Treatment: 15 History: Neuropathy Clustered Wound: No Photos Wound Measurements Length: (cm) 5 Width: (cm) 1 Depth: (cm) 0 Area: (cm) Volume: (cm) % Reduction in Area: 91.2% .2 % Reduction in Volume: 98.2% .1 Epithelialization: Large (67-100%) 4.712 Tunneling:  No 0.471 Undermining: No Wound Description Classification: Full Thickness Without Exposed Suppor Wound Margin: Distinct, outline attached Exudate Amount: Medium Exudate Type: Serosanguineous Exudate Color: red, brown t Structures Foul Odor After Cleansing: No Slough/Fibrino Yes Wound Bed Granulation Amount: Large (67-100%) Exposed Structure Granulation Quality: Red, Pink Fascia Exposed: No Necrotic Amount: Small (1-33%) Fat  Layer (Subcutaneous Tissue) Exposed: Yes Necrotic Quality: Adherent Slough Tendon Exposed: No Muscle Exposed: No Joint Exposed: No Bone Exposed: No Periwound Skin Texture Texture Color No Abnormalities Noted: No No Abnormalities Noted: No Callus: No Atrophie Blanche: No Crepitus: No Cyanosis: No Excoriation: No Ecchymosis: No Induration: No Erythema: No Rash: No Hemosiderin Staining: Yes Scarring: Yes Mottled: No Pallor: No Moisture Rubor: No No Abnormalities Noted: No Dry / Scaly: No Maceration: No DAYA, DUTT ([id number]) [id number] Page 8 of 9 Treatment Notes Wound #1 (Lower Leg) Wound Laterality: Right, Lateral Cleanser Soap and Water Discharge Instruction: May shower and wash wound with dial antibacterial soap and water prior to dressing change. Wound Cleanser Discharge Instruction: Cleanse the wound with wound cleanser prior to applying a clean dressing using gauze sponges, not tissue or cotton balls. Peri-Wound Care Zinc Oxide Ointment 30g tube Discharge Instruction: Apply Zinc Oxide to periwound lightly around the periwound. Sween Lotion (Moisturizing lotion) Discharge Instruction: Apply moisturizing lotion as directed Topical Gentamicin Discharge Instruction: add directly to wound bed. Mupirocin Ointment Discharge Instruction: Apply Mupirocin (Bactroban) as instructed Primary Dressing Hydrofera Blue Ready Foam, 2.5 x2.5 in Discharge Instruction: Apply to wound bed as instructed Secondary  Dressing Zetuvit Plus 4x8 in Discharge Instruction: Apply over primary dressing as directed. Secured With Compression Wrap Kerlix Roll 4.5x3.1 (in/yd) Discharge Instruction: Apply Kerlix and Coban compression as directed. Coban Self-Adherent Wrap 4x5 (in/yd) Discharge Instruction: Apply over Kerlix as directed. Compression Stockings Add-Ons Electronic Signature(s) Signed: 02/02/2022 4:59:17 PM By: Deon Pilling RN, BSN Entered By: Deon Pilling on 02/02/2022 13:06:19 -------------------------------------------------------------------------------- Vitals Details Patient Name: Date of Service: Vicki [name], Vicki Holler T. 02/02/2022 1:00 PM Medical Record Number: [id number] Patient Account Number: [id number] Date of Birth/Sex: Treating RN: 11/25/1923 (88 y.o. [name], [name] Primary Care Kypton Eltringham: Elenor Quinones Other Clinician: Referring Esaias Cleavenger: Treating Suresh Audi/Extender: [place] Poli in Treatment: 15 Vital Signs Time Taken: 13:11 Temperature (F): 97.9 Height (in): 62 Pulse (bpm): 71 Weight (lbs): 142 Respiratory Rate (breaths/min): 20 Body Mass Index (BMI): 26 Blood Pressure (mmHg): 128/81 Reference Range: 80 - 120 mg / dl Electronic Signature(s) DONETTA, ISAZA T ([id number]) [id number] Page 9 of 9 Signed: 02/02/2022 4:59:17 PM By: Deon Pilling RN, BSN Entered By: Deon Pilling on 02/02/2022 13:11:55

## 2022-02-09 ENCOUNTER — Encounter (HOSPITAL_BASED_OUTPATIENT_CLINIC_OR_DEPARTMENT_OTHER): Payer: Medicare Other | Admitting: Internal Medicine

## 2022-02-09 DIAGNOSIS — T798XXA Other early complications of trauma, initial encounter: Secondary | ICD-10-CM | POA: Diagnosis not present

## 2022-02-09 DIAGNOSIS — I87311 Chronic venous hypertension (idiopathic) with ulcer of right lower extremity: Secondary | ICD-10-CM | POA: Diagnosis not present

## 2022-02-09 DIAGNOSIS — E039 Hypothyroidism, unspecified: Secondary | ICD-10-CM | POA: Diagnosis not present

## 2022-02-09 DIAGNOSIS — L97812 Non-pressure chronic ulcer of other part of right lower leg with fat layer exposed: Secondary | ICD-10-CM | POA: Diagnosis not present

## 2022-02-09 NOTE — Progress Notes (Addendum)
Vicki [name] ([id number]) [id number] Page 1 of 9 Visit Report for 02/09/2022 Chief Complaint Document Details Patient Name: Date of Service: Vicki [name], Vicki [name] 02/09/2022 1:15 PM Medical Record Number: [id number] Patient Account Number: [id number] Date of Birth/Sex: Treating RN: 1923-12-15 (88 y.o. F) Primary Care Provider: Elenor [name] Other Clinician: Referring Provider: Treating Provider/Extender: Hartford Poli in Treatment: 16 Information Obtained from: Patient Chief Complaint 10/20/2021; right lower extremity wound status post trauma Electronic Signature(s) Signed: 02/09/2022 3:53:46 PM By: Kalman Shan DO Entered By: Kalman Shan on 02/09/2022 14:38:00 -------------------------------------------------------------------------------- HPI Details Patient Name: Date of Service: Vicki [name]. 02/09/2022 1:15 PM Medical Record Number: [id number] Patient Account Number: [id number] Date of Birth/Sex: Treating RN: [birth year] (88 y.o. F) Primary Care Provider: Elenor [name] Other Clinician: Referring Provider: Treating Provider/Extender: Hartford Poli in Treatment: 16 History of Present Illness HPI Description: Admission 10/20/2021 [name] is a 88 year old female with a past medical history of hypothyroidism and essential hypertension that presents to the clinic for a 18-monthhistory of ulcer to the right lower extremity. On 08/15/2021 the patient experienced a mechanical fall and hit her right leg against her table. She developed a hematoma and she was evaluated in the ED for this issue. The hematoma was evacuated in the ED. She had x-rays of the tibia/fibula and ankle that showed no acute osseous abnormalities. She has been using silver alginate to the wound bed. She did have 2 rounds of doxycycline for this issue and recently completed her second course. She  currently denies signs of infection. 8/28; patient presents for follow-up. She has no issues or complaints today. We have been doing Santyl and Hydrofera Blue under Kerlix/Coban. She has home health that they start coming out once weekly. She denies signs of infection. 9/5; patient presents for follow-up. We have been doing Hydrofera Blue with antibiotic ointment under Kerlix/Coban. She has no issues or complaints today. 9/11; patient presents for follow-up. We have been doing Hydrofera Blue under Kerlix/Coban. Home health has been changing the dressing weekly. The wrap was too tight at the more proximal and cutting into the patient's skin. Other than that she has no issues or complaints today. 9/18; patient presents for follow-up. We have been using PolyMem silver under Kerlix/Coban for the past week. She has no issues or complaints today. 9/25; patient presents for follow-up. We have been using PolyMem silver under Kerlix/Coban. She has no issues or complaints today. 10/2; patient presents for follow-up. We have been using PolyMem silver with antibiotic ointment under compression therapy. Patient has no issues or complaints today. 10/9; patient presents for follow-up. We have been using PolyMem silver under compression therapy. Patient has no issues or complaints today. She reports improvement in wound healing. 10/16; patient presents for follow-up. We have been using PolyMem silver with antibiotic ointment under compression therapy. Patient has no issues or complaints today. 10/23; patient presents for follow-up. We have been using PolyMem silver with antibiotic ointment under Kerlix/Coban. She has no issues or complaints today. 10/30; patient presents for follow-up. We have been using PolyMem silver with antibiotic ointment under Kerlix/Coban. Her wound continues to show signs of AQUIARA, KORZENIEWSKI([id number] Page 2 of 9 healing. 11/13; patient presents  for follow-up. We have been using PolyMem silver with antibiotic ointment under Kerlix/Coban. She has more hyper granulated tissue and nonviable tissue today. She denies signs of infection. 11/20; patient presents for follow-up. We have been using Hydrofera Blue with  antibiotic ointment under compression therapy. Patient states she is tolerating this well. She denies signs of infection. 11/27; patient presents for follow-up. We have been using Hydrofera Blue with antibiotic ointment under compression therapy. She has made significant improvement in wound healing over the past week. She has no issues or complaints today. 12/4; patient presents for follow-up. We have been using Hydrofera Blue with antibiotic under compression therapy. She continues to improve and her wound healing. She has no issues or complaints today. 12/11; patient presents for follow-up. We have been using Hydrofera Blue with antibiotic ointment under compression therapy. Her wound is smaller today. She has no issues or complaints. Electronic Signature(s) Signed: 02/09/2022 3:53:46 PM By: Kalman Shan DO Entered By: Kalman Shan on 02/09/2022 14:39:05 -------------------------------------------------------------------------------- Physical Exam Details Patient Name: Date of Service: Vicki [name]. 02/09/2022 1:15 PM Medical Record Number: [id number] Patient Account Number: [id number] Date of Birth/Sex: Treating RN: 11-09-1923 (88 y.o. F) Primary Care Provider: Elenor [name] Other Clinician: Referring Provider: Treating Provider/Extender: Hartford Poli in Treatment: 16 Constitutional respirations regular, non-labored and within target range for patient.. Cardiovascular 2+ dorsalis pedis/posterior tibialis pulses. Psychiatric pleasant and cooperative. Notes Right lower extremity: [name] the anterior aspect there is an open wound with granulation tissue. Good edema  control. o Electronic Signature(s) Signed: 02/09/2022 3:53:46 PM By: Kalman Shan DO Entered By: Kalman Shan on 02/09/2022 14:40:36 -------------------------------------------------------------------------------- Physician Orders Details Patient Name: Date of Service: Vicki [name]. 02/09/2022 1:15 PM Medical Record Number: [id number] Patient Account Number: [id number] Date of Birth/Sex: Treating RN: [birth year] (87 y.o. [name], [name] Primary Care Provider: Elenor [name] Other Clinician: Referring Provider: Treating Provider/Extender: Hartford Poli in Treatment: [phone] Verbal / Phone Orders: No Diagnosis Coding Follow-up Appointments ppointment in 1 week. - w/ Dr. Heber [place] Return A [place], Natalia [name] ([id number]) [id number] Page 3 of 9 ppointment in 2 weeks. - Dr. Heber [place] Return A Anesthetic (In clinic) Topical Lidocaine 5% applied to wound bed (In clinic) Topical Lidocaine 4% applied to wound bed Bathing/ Shower/ Hygiene May shower with protection but do not get wound dressing(s) wet. Edema Control - Lymphedema / SCD / Other Elevate legs to the level of the heart or above for 30 minutes daily and/or when sitting, a frequency of: - 3-4 times a day throughout the day. Avoid standing for long periods of time. Exercise regularly Non Wound Condition Other Non Wound Condition Orders/Instructions: - Put xeroform to small skin tear right above wound for protection Wound Treatment Wound #1 - Lower Leg Wound Laterality: Right, Lateral Cleanser: Soap and Water 1 x Per Week/30 Days Discharge Instructions: May shower and wash wound with dial antibacterial soap and water prior to dressing change. Cleanser: Wound Cleanser 1 x Per Week/30 Days Discharge Instructions: Cleanse the wound with wound cleanser prior to applying a clean dressing using gauze sponges, not tissue or cotton balls. Peri-Wound Care: Zinc Oxide  Ointment 30g tube 1 x Per Week/30 Days Discharge Instructions: Apply Zinc Oxide to periwound lightly around the periwound. Peri-Wound Care: Sween Lotion (Moisturizing lotion) 1 x Per Week/30 Days Discharge Instructions: Apply moisturizing lotion as directed Prim Dressing: Promogran Prisma Matrix, 4.34 (sq in) (silver collagen) 1 x Per Week/30 Days ary Discharge Instructions: Moisten collagen with saline or hydrogel Secondary Dressing: Zetuvit Plus 4x8 in 1 x Per Week/30 Days Discharge Instructions: Apply over primary dressing as directed. Compression Wrap: Kerlix Roll 4.5x3.1 (in/yd) (Home Health) 1 x Per Week/30 Days Discharge Instructions: Apply Kerlix and  Coban compression as directed. Compression Wrap: Coban Self-Adherent Wrap 4x5 (in/yd) (Home Health) 1 x Per Week/30 Days Discharge Instructions: Apply over Kerlix as directed. Electronic Signature(s) Signed: 02/09/2022 3:53:46 PM By: Kalman Shan DO Entered By: Kalman Shan on 02/09/2022 14:40:46 -------------------------------------------------------------------------------- Problem List Details Patient Name: Date of Service: Vicki [name]. 02/09/2022 1:15 PM Medical Record Number: [id number] Patient Account Number: [id number] Date of Birth/Sex: Treating RN: May 20, 1923 (88 y.o. F) Primary Care Provider: Elenor [name] Other Clinician: Referring Provider: Treating Provider/Extender: Hartford Poli in Treatment: 16 Active Problems ICD-10 Encounter Code Description Active Date MDM Diagnosis L97.812 Non-pressure chronic ulcer of other part of right lower leg with fat layer 10/20/2021 No Yes exposed Vicki [name], Vicki [name] ([id number]) [id number] Page 4 of 9 T79.8XXA Other early complications of trauma, initial encounter 10/20/2021 No Yes E03.9 Hypothyroidism, unspecified 10/20/2021 No Yes I87.311 Chronic venous hypertension (idiopathic) with ulcer of right lower  extremity 10/20/2021 No Yes Inactive Problems Resolved Problems Electronic Signature(s) Signed: 02/09/2022 3:53:46 PM By: Kalman Shan DO Entered By: Kalman Shan on 02/09/2022 14:37:34 -------------------------------------------------------------------------------- Progress Note Details Patient Name: Date of Service: Vicki [name]. 02/09/2022 1:15 PM Medical Record Number: [id number] Patient Account Number: [id number] Date of Birth/Sex: Treating RN: 06-20-1923 (88 y.o. F) Primary Care Provider: Elenor [name] Other Clinician: Referring Provider: Treating Provider/Extender: Hartford Poli in Treatment: 16 Subjective Chief Complaint Information obtained from Patient 10/20/2021; right lower extremity wound status post trauma History of Present Illness (HPI) Admission 10/20/2021 [name] is a 88 year old female with a past medical history of hypothyroidism and essential hypertension that presents to the clinic for a 56-monthhistory of ulcer to the right lower extremity. On 08/15/2021 the patient experienced a mechanical fall and hit her right leg against her table. She developed a hematoma and she was evaluated in the ED for this issue. The hematoma was evacuated in the ED. She had x-rays of the tibia/fibula and ankle that showed no acute osseous abnormalities. She has been using silver alginate to the wound bed. She did have 2 rounds of doxycycline for this issue and recently completed her second course. She currently denies signs of infection. 8/28; patient presents for follow-up. She has no issues or complaints today. We have been doing Santyl and Hydrofera Blue under Kerlix/Coban. She has home health that they start coming out once weekly. She denies signs of infection. 9/5; patient presents for follow-up. We have been doing Hydrofera Blue with antibiotic ointment under Kerlix/Coban. She has no issues or complaints today. 9/11;  patient presents for follow-up. We have been doing Hydrofera Blue under Kerlix/Coban. Home health has been changing the dressing weekly. The wrap was too tight at the more proximal and cutting into the patient's skin. Other than that she has no issues or complaints today. 9/18; patient presents for follow-up. We have been using PolyMem silver under Kerlix/Coban for the past week. She has no issues or complaints today. 9/25; patient presents for follow-up. We have been using PolyMem silver under Kerlix/Coban. She has no issues or complaints today. 10/2; patient presents for follow-up. We have been using PolyMem silver with antibiotic ointment under compression therapy. Patient has no issues or complaints today. 10/9; patient presents for follow-up. We have been using PolyMem silver under compression therapy. Patient has no issues or complaints today. She reports improvement in wound healing. 10/16; patient presents for follow-up. We have been using PolyMem silver with antibiotic ointment under compression therapy. Patient has no issues  or complaints today. 10/23; patient presents for follow-up. We have been using PolyMem silver with antibiotic ointment under Kerlix/Coban. She has no issues or complaints today. 10/30; patient presents for follow-up. We have been using PolyMem silver with antibiotic ointment under Kerlix/Coban. Her wound continues to show signs of healing. 11/13; patient presents for follow-up. We have been using PolyMem silver with antibiotic ointment under Kerlix/Coban. She has more hyper granulated tissue and nonviable tissue today. She denies signs of infection. Vicki [name], Vicki [name] ([id number]) [id number] Page 5 of 9 11/20; patient presents for follow-up. We have been using Hydrofera Blue with antibiotic ointment under compression therapy. Patient states she is tolerating this well. She denies signs of infection. 11/27; patient presents for follow-up. We  have been using Hydrofera Blue with antibiotic ointment under compression therapy. She has made significant improvement in wound healing over the past week. She has no issues or complaints today. 12/4; patient presents for follow-up. We have been using Hydrofera Blue with antibiotic under compression therapy. She continues to improve and her wound healing. She has no issues or complaints today. 12/11; patient presents for follow-up. We have been using Hydrofera Blue with antibiotic ointment under compression therapy. Her wound is smaller today. She has no issues or complaints. Patient History Information obtained from Patient. Family History Diabetes - Child, Heart Disease - Child,Mother,Father,Siblings, Hypertension - Father,Mother, No family history of Cancer, Hereditary Spherocytosis, Kidney Disease, Lung Disease, Seizures, Stroke, Thyroid Problems, Tuberculosis. Social History Never smoker, Marital Status - Widowed, Alcohol Use - Never, Drug Use - No History, Caffeine Use - Rarely. Medical History Eyes Denies history of Cataracts, Glaucoma, Optic Neuritis Ear/Nose/Mouth/Throat Denies history of Chronic sinus problems/congestion, Middle ear problems Hematologic/Lymphatic Denies history of Anemia, Hemophilia, Human Immunodeficiency Virus, Lymphedema, Sickle Cell Disease Respiratory Denies history of Aspiration, Asthma, Chronic Obstructive Pulmonary Disease (COPD), Pneumothorax, Sleep Apnea, Tuberculosis Cardiovascular Patient has history of Congestive Heart Failure, Hypertension Denies history of Angina, Arrhythmia, Coronary Artery Disease, Deep Vein Thrombosis, Hypotension, Myocardial Infarction, Peripheral Arterial Disease, Peripheral Venous Disease, Phlebitis, Vasculitis Gastrointestinal Denies history of Cirrhosis , Colitis, Crohnoos, Hepatitis A, Hepatitis B, Hepatitis C Endocrine Denies history of Type I Diabetes, Type II Diabetes Immunological Denies history of Lupus  Erythematosus, Raynaudoos, Scleroderma Integumentary (Skin) Denies history of History of Burn Musculoskeletal Patient has history of Osteoarthritis Denies history of Gout, Rheumatoid Arthritis, Osteomyelitis Neurologic Patient has history of Neuropathy Denies history of Dementia, Quadriplegia, Paraplegia, Seizure Disorder Psychiatric Denies history of Anorexia/bulimia, Confinement Anxiety Hospitalization/Surgery History - back surgery. - right hip surgery. - hysterotomy. Medical A Surgical History Notes nd Ear/Nose/Mouth/Throat hearing aides Genitourinary Stage three Objective Constitutional respirations regular, non-labored and within target range for patient.. Vitals Time Taken: 1:55 PM, Height: 62 in, Weight: 142 lbs, BMI: 26, Temperature: 98.4 F, Pulse: 65 bpm, Respiratory Rate: 20 breaths/min, Blood Pressure: 140/77 mmHg. Cardiovascular 2+ dorsalis pedis/posterior tibialis pulses. Psychiatric pleasant and cooperative. General Notes: Right lower extremity: [name] the anterior aspect there is an open wound with granulation tissue. Good edema control. o Integumentary (Hair, Skin) Wound #1 status is Open. Original cause of wound was Trauma. The date acquired was: 08/04/2021. The wound has been in treatment 16 weeks. The wound is Vicki [name], Vicki [name] ([id number]) [id number] Page 6 of 9 located on the Right,Lateral Lower Leg. The wound measures 2.7cm length x 0.8cm width x 0.1cm depth; 1.696cm^2 area and 0.17cm^3 volume. There is Fat Layer (Subcutaneous Tissue) exposed. There is no tunneling or undermining noted. There is a medium amount of serosanguineous drainage noted.  The wound margin is distinct with the outline attached to the wound base. There is large (67-100%) red, pink granulation within the wound bed. There is no necrotic tissue within the wound bed. The periwound skin appearance exhibited: Scarring, Hemosiderin Staining. The periwound skin appearance  did not exhibit: Callus, Crepitus, Excoriation, Induration, Rash, Dry/Scaly, Maceration, Atrophie Blanche, Cyanosis, Ecchymosis, Mottled, Pallor, Rubor, Erythema. Assessment Active Problems ICD-10 Non-pressure chronic ulcer of other part of right lower leg with fat layer exposed Other early complications of trauma, initial encounter Hypothyroidism, unspecified Chronic venous hypertension (idiopathic) with ulcer of right lower extremity Patient's wound has shown improvement in size and appearance since last clinic visit. I recommended switching the dressing to collagen and continuing compression therapy. Follow-up in 1 week. Plan Follow-up Appointments: Return Appointment in 1 week. - w/ Dr. Heber [place] Return Appointment in 2 weeks. - Dr. Heber  Anesthetic: (In clinic) Topical Lidocaine 5% applied to wound bed (In clinic) Topical Lidocaine 4% applied to wound bed Bathing/ Shower/ Hygiene: May shower with protection but do not get wound dressing(s) wet. Edema Control - Lymphedema / SCD / Other: Elevate legs to the level of the heart or above for 30 minutes daily and/or when sitting, a frequency of: - 3-4 times a day throughout the day. Avoid standing for long periods of time. Exercise regularly Non Wound Condition: Other Non Wound Condition Orders/Instructions: - Put xeroform to small skin tear right above wound for protection WOUND #1: - Lower Leg Wound Laterality: Right, Lateral Cleanser: Soap and Water 1 x Per Week/30 Days Discharge Instructions: May shower and wash wound with dial antibacterial soap and water prior to dressing change. Cleanser: Wound Cleanser 1 x Per Week/30 Days Discharge Instructions: Cleanse the wound with wound cleanser prior to applying a clean dressing using gauze sponges, not tissue or cotton balls. Peri-Wound Care: Zinc Oxide Ointment 30g tube 1 x Per Week/30 Days Discharge Instructions: Apply Zinc Oxide to periwound lightly around the periwound. Peri-Wound  Care: Sween Lotion (Moisturizing lotion) 1 x Per Week/30 Days Discharge Instructions: Apply moisturizing lotion as directed Prim Dressing: Promogran Prisma Matrix, 4.34 (sq in) (silver collagen) 1 x Per Week/30 Days ary Discharge Instructions: Moisten collagen with saline or hydrogel Secondary Dressing: Zetuvit Plus 4x8 in 1 x Per Week/30 Days Discharge Instructions: Apply over primary dressing as directed. Com pression Wrap: Kerlix Roll 4.5x3.1 (in/yd) (Home Health) 1 x Per Week/30 Days Discharge Instructions: Apply Kerlix and Coban compression as directed. Com pression Wrap: Coban Self-Adherent Wrap 4x5 (in/yd) (Home Health) 1 x Per Week/30 Days Discharge Instructions: Apply over Kerlix as directed. 1. Collagen under Kerlix/Coban 2. Follow-up in 1 week Electronic Signature(s) Signed: 02/09/2022 3:53:46 PM By: Kalman Shan DO Entered By: Kalman Shan on 02/09/2022 14:41:29 -------------------------------------------------------------------------------- HxROS Details Patient Name: Date of Service: Vicki [name]. 02/09/2022 1:15 PM Medical Record Number: [id number] Patient Account Number: [id number] Vicki [name], Vicki [name] ([id number]) [id number] Page 7 of 9 Date of Birth/Sex: Treating RN: [birth year] (88 y.o. F) Primary Care Provider: Other Clinician: Elenor [name] Referring Provider: Treating Provider/Extender: Hartford Poli in Treatment: 16 Information Obtained From Patient Eyes Medical History: Negative for: Cataracts; Glaucoma; Optic Neuritis Ear/Nose/Mouth/Throat Medical History: Negative for: Chronic sinus problems/congestion; Middle ear problems Past Medical History Notes: hearing aides Hematologic/Lymphatic Medical History: Negative for: Anemia; Hemophilia; Human Immunodeficiency Virus; Lymphedema; Sickle Cell Disease Respiratory Medical History: Negative for: Aspiration; Asthma; Chronic  Obstructive Pulmonary Disease (COPD); Pneumothorax; Sleep Apnea; Tuberculosis Cardiovascular Medical History: Positive for: Congestive Heart Failure; Hypertension Negative  for: Angina; Arrhythmia; Coronary Artery Disease; Deep Vein Thrombosis; Hypotension; Myocardial Infarction; Peripheral Arterial Disease; Peripheral Venous Disease; Phlebitis; Vasculitis Gastrointestinal Medical History: Negative for: Cirrhosis ; Colitis; Crohns; Hepatitis A; Hepatitis B; Hepatitis C Endocrine Medical History: Negative for: Type I Diabetes; Type II Diabetes Genitourinary Medical History: Past Medical History Notes: Stage three Immunological Medical History: Negative for: Lupus Erythematosus; Raynauds; Scleroderma Integumentary (Skin) Medical History: Negative for: History of Burn Musculoskeletal Medical History: Positive for: Osteoarthritis Negative for: Gout; Rheumatoid Arthritis; Osteomyelitis Neurologic Medical History: Positive for: Neuropathy Negative for: Dementia; Quadriplegia; Paraplegia; Seizure Disorder Psychiatric Medical History: Negative for: Vicki [name], Vicki [name] ([id number]) [id number] Page 8 of 9 Immunizations Pneumococcal Vaccine: Received Pneumococcal Vaccination: Yes Received Pneumococcal Vaccination On or After 60th Birthday: Yes Implantable Devices Yes Hospitalization / Surgery History Type of Hospitalization/Surgery back surgery right hip surgery hysterotomy Family and Social History Cancer: No; Diabetes: Yes - Child; Heart Disease: Yes - Child,Mother,Father,Siblings; Hereditary Spherocytosis: No; Hypertension: Yes - Father,Mother; Kidney Disease: No; Lung Disease: No; Seizures: No; Stroke: No; Thyroid Problems: No; Tuberculosis: No; Never smoker; Marital Status - Widowed; Alcohol Use: Never; Drug Use: No History; Caffeine Use: Rarely; Financial Concerns: No; Food, Clothing or Shelter Needs: No; Support  System Lacking: No; Transportation Concerns: No Electronic Signature(s) Signed: 02/09/2022 3:53:46 PM By: Kalman Shan DO Entered By: Kalman Shan on 02/09/2022 14:39:22 -------------------------------------------------------------------------------- SuperBill Details Patient Name: Date of Service: Vicki [name]. 02/09/2022 Medical Record Number: [id number] Patient Account Number: [id number] Date of Birth/Sex: Treating RN: Feb 09, 1924 (88 y.o. [name], [name] Primary Care Provider: Elenor [name] Other Clinician: Referring Provider: Treating Provider/Extender: Hartford Poli in Treatment: 16 Diagnosis Coding ICD-10 Codes Code Description [phone] Non-pressure chronic ulcer of other part of right lower leg with fat layer exposed T79.8XXA Other early complications of trauma, initial encounter E03.9 Hypothyroidism, unspecified I87.311 Chronic venous hypertension (idiopathic) with ulcer of right lower extremity Facility Procedures : CPT4 Code: [id number] Description: 99213 - WOUND CARE VISIT-LEV 3 EST PT Modifier: Quantity: 1 Physician Procedures : CPT4 Code Description Modifier [id number] - WC PHYS LEVEL 3 - EST PT ICD-10 Diagnosis Description [id number] Non-pressure chronic ulcer of other part of right lower leg with fat layer exposed T79.8XXA Other early complications of trauma, initial  encounter E03.9 Hypothyroidism, unspecified 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

## 2022-02-12 NOTE — Progress Notes (Signed)
CYENNA, REBELLO ([id number]) [id number] Page 1 of 10 Visit Report for 02/09/2022 Arrival Information Details Patient Name: Date of Service: KAYANN, MAJ 02/09/2022 1:15 PM Medical Record Number: [id number] Patient Account Number: [id number] Date of Birth/Sex: Treating RN: [birth year] (88 y.o. [name], Meta.Reding Primary Care Provider: Elenor Quinones Other Clinician: Referring Provider: Treating Provider/Extender: Hartford Poli in Treatment: 16 Visit Information History Since Last Visit Added or deleted any medications: No Patient Arrived: Walker Any new allergies or adverse reactions: No Arrival Time: 13:55 Had a fall or experienced change in No Accompanied By: daughter in law activities of daily living that may affect Transfer Assistance: None risk of falls: Patient Identification Verified: Yes Signs or symptoms of abuse/neglect since last visito No Secondary Verification Process Completed: Yes Hospitalized since last visit: No Patient Requires Transmission-Based Precautions: No Implantable device outside of the clinic excluding No Patient Has Alerts: No cellular tissue based products placed in the center since last visit: Has Dressing in Place as Prescribed: Yes Has Compression in Place as Prescribed: Yes Pain Present Now: No Electronic Signature(s) Signed: 02/09/2022 4:58:54 PM By: Deon Pilling RN, BSN Entered By: Deon Pilling on 02/09/2022 14:01:16 -------------------------------------------------------------------------------- Clinic Level of Care Assessment Details Patient Name: Date of Service: ESTALEE, MCCANDLISH 02/09/2022 1:15 PM Medical Record Number: [id number] Patient Account Number: [id number] Date of Birth/Sex: Treating RN: [birth year] (88 y.o. [name], [name] Primary Care Provider: Elenor Quinones Other Clinician: Referring Provider: Treating Provider/Extender: [place] Poli in Treatment: 16 Clinic Level of Care Assessment Items TOOL 4 Quantity Score X- 1 0 Use when only an EandM is performed on FOLLOW-UP visit ASSESSMENTS - Nursing Assessment / Reassessment X- 1 10 Reassessment of Co-morbidities (includes updates in patient status) X- 1 5 Reassessment of Adherence to Treatment Plan ASSESSMENTS - Wound and Skin A ssessment / Reassessment X - Simple Wound Assessment / Reassessment - one wound 1 5 [] - 0 Complex Wound Assessment / Reassessment - multiple wounds [] - 0 Dermatologic / Skin Assessment (not related to wound area) ASSESSMENTS - Focused Assessment X- 1 5 Circumferential Edema Measurements - multi extremities [] - 0 Nutritional Assessment / Counseling / Intervention JALYN, DUTTA T ([id number]) [id number] Page 2 of 10 [] - 0 Lower Extremity Assessment (monofilament, tuning fork, pulses) [] - 0 Peripheral Arterial Disease Assessment (using hand held doppler) ASSESSMENTS - Ostomy and/or Continence Assessment and Care [] - 0 Incontinence Assessment and Management [] - 0 Ostomy Care Assessment and Management (repouching, etc.) PROCESS - Coordination of Care X - Simple Patient / Family Education for ongoing care 1 15 [] - 0 Complex (extensive) Patient / Family Education for ongoing care X- 1 10 Staff obtains Programmer, systems, Records, T Results / Process Orders est [] - 0 Staff telephones HHA, Nursing Homes / Clarify orders / etc [] - 0 Routine Transfer to another Facility (non-emergent condition) [] - 0 Routine Hospital Admission (non-emergent condition) [] - 0 New Admissions / Biomedical engineer / Ordering NPWT Apligraf, etc. , [] - 0 Emergency Hospital Admission (emergent condition) X- 1 10 Simple Discharge Coordination [] - 0 Complex (extensive) Discharge Coordination PROCESS - Special Needs [] - 0 Pediatric / Minor Patient Management [] - 0 Isolation Patient  Management [] - 0 Hearing / Language / Visual special needs [] - 0 Assessment of Community assistance (transportation, D/C planning, etc.) [] - 0 Additional assistance / Altered mentation [] - 0 Support Surface(s) Assessment (bed, cushion, seat, etc.)  INTERVENTIONS - Wound Cleansing / Measurement X - Simple Wound Cleansing - one wound 1 5 [] - 0 Complex Wound Cleansing - multiple wounds X- 1 5 Wound Imaging (photographs - any number of wounds) [] - 0 Wound Tracing (instead of photographs) X- 1 5 Simple Wound Measurement - one wound [] - 0 Complex Wound Measurement - multiple wounds INTERVENTIONS - Wound Dressings [] - 0 Small Wound Dressing one or multiple wounds X- 1 15 Medium Wound Dressing one or multiple wounds [] - 0 Large Wound Dressing one or multiple wounds X- 1 5 Application of Medications - topical [] - 0 Application of Medications - injection INTERVENTIONS - Miscellaneous [] - 0 External ear exam [] - 0 Specimen Collection (cultures, biopsies, blood, body fluids, etc.) [] - 0 Specimen(s) / Culture(s) sent or taken to Lab for analysis [] - 0 Patient Transfer (multiple staff / Civil Service fast streamer / Similar devices) [] - 0 Simple Staple / Suture removal (25 or less) [] - 0 Complex Staple / Suture removal (26 or more) [] - 0 Hypo / Hyperglycemic Management (close monitor of Blood Glucose) STEFFANY, SCHOENFELDER T ([id number]) [id number] Page 3 of 10 [] - 0 Ankle / Brachial Index (ABI) - do not check if billed separately X- 1 5 Vital Signs Has the patient been seen at the hospital within the last three years: Yes Total Score: 100 Level Of Care: New/Established - Level 3 Electronic Signature(s) Signed: 02/12/2022 5:16:09 PM By: Rhae Hammock RN Entered By: Rhae Hammock on 02/09/2022 14:31:02 -------------------------------------------------------------------------------- Encounter Discharge Information Details Patient Name: Date of  Service: Seleta Rhymes T. 02/09/2022 1:15 PM Medical Record Number: [id number] Patient Account Number: [id number] Date of Birth/Sex: Treating RN: 11/19/1923 (88 y.o. [name], [name] Primary Care Provider: Elenor Quinones Other Clinician: Referring Provider: Treating Provider/Extender: [place] Poli in Treatment: 16 Encounter Discharge Information Items Discharge Condition: Stable Ambulatory Status: Walker Discharge Destination: Home Transportation: Private Auto Accompanied By: self Schedule Follow-up Appointment: Yes Clinical Summary of Care: Patient Declined Electronic Signature(s) Signed: 02/12/2022 5:16:09 PM By: Rhae Hammock RN Entered By: Rhae Hammock on 02/09/2022 14:31:35 -------------------------------------------------------------------------------- Lower Extremity Assessment Details Patient Name: Date of Service: Seleta Rhymes T. 02/09/2022 1:15 PM Medical Record Number: [id number] Patient Account Number: [id number] Date of Birth/Sex: Treating RN: Jun 08, 1923 (88 y.o. [name] Primary Care Provider: Elenor Quinones Other Clinician: Referring Provider: Treating Provider/Extender: [place] Poli in Treatment: 16 Edema Assessment Assessed: Shirlyn Goltz: No] Patrice Paradise: Yes] Edema: [Left: N] [Right: o] Calf Left: Right: Point of Measurement: 28 cm From Medial Instep 34 cm Ankle Left: Right: Point of Measurement: 12 cm From Medial Instep 21 cm Vascular Assessment Left: [[id number] Page 4 of 10Right:] Pulses: Dorsalis Pedis Palpable: [[id number] Page 4 of 10Yes] Electronic Signature(s) Signed: 02/09/2022 4:58:54 PM By: Deon Pilling RN, BSN Entered By: Deon Pilling on 02/09/2022 14:01:56 -------------------------------------------------------------------------------- Multi Wound Chart Details Patient Name: Date of Service: Seleta Rhymes T. 02/09/2022 1:15 PM Medical Record Number: [id number] Patient Account Number: [id number] Date of Birth/Sex: Treating RN: 12/05/1923 (88 y.o. F) Primary Care Provider: Elenor Quinones Other Clinician: Referring Provider: Treating Provider/Extender: [place] Poli in Treatment: 16 Vital Signs Height(in): 62 Pulse(bpm): 65 Weight(lbs): 142 Blood Pressure(mmHg): 140/77 Body Mass Index(BMI): 26 Temperature(F): 98.4 Respiratory Rate(breaths/min): 20 [1:Photos:] [N/A:N/A] Right, Lateral Lower Leg N/A N/A Wound Location: Trauma N/A N/A Wounding Event: Abrasion N/A N/A Primary Etiology: Lymphedema N/A N/A Secondary Etiology: Congestive Heart Failure, N/A N/A Comorbid  History: Hypertension, Osteoarthritis, Neuropathy 08/04/2021 N/A N/A Date Acquired: 16 N/A N/A Weeks of Treatment: Open N/A N/A Wound Status: No N/A N/A Wound Recurrence: 2.7x0.8x0.1 N/A N/A Measurements L x W x D (cm) 1.696 N/A N/A A (cm) : rea 0.17 N/A N/A Volume (cm) : 96.80% N/A N/A % Reduction in Area: 99.40% N/A N/A % Reduction in Volume: Full Thickness Without Exposed N/A N/A Classification: Support Structures Medium N/A N/A Exudate Amount: Serosanguineous N/A N/A Exudate Type: red, brown N/A N/A Exudate Color: Distinct, outline attached N/A N/A Wound Margin: Large (67-100%) N/A N/A Granulation Amount: Red, Pink N/A N/A Granulation Quality: None Present (0%) N/A N/A Necrotic Amount: Fat Layer (Subcutaneous Tissue): Yes N/A N/A Exposed Structures: Fascia: No Tendon: No Muscle: No Joint: No Bone: No Large (67-100%) N/A N/A Epithelialization: Scarring: Yes N/A N/A Periwound Skin Texture: Excoriation: No SHAMEL, GALYEAN ([id number]) [id number] Page 5 of 10 Induration: No Callus: No Crepitus: No Rash: No Maceration: No N/A N/A Periwound Skin Moisture: Dry/Scaly: No Hemosiderin Staining: Yes N/A  N/A Periwound Skin Color: Atrophie Blanche: No Cyanosis: No Ecchymosis: No Erythema: No Mottled: No Pallor: No Rubor: No Treatment Notes Wound #1 (Lower Leg) Wound Laterality: Right, Lateral Cleanser Soap and Water Discharge Instruction: May shower and wash wound with dial antibacterial soap and water prior to dressing change. Wound Cleanser Discharge Instruction: Cleanse the wound with wound cleanser prior to applying a clean dressing using gauze sponges, not tissue or cotton balls. Peri-Wound Care Zinc Oxide Ointment 30g tube Discharge Instruction: Apply Zinc Oxide to periwound lightly around the periwound. Sween Lotion (Moisturizing lotion) Discharge Instruction: Apply moisturizing lotion as directed Topical Primary Dressing Promogran Prisma Matrix, 4.34 (sq in) (silver collagen) Discharge Instruction: Moisten collagen with saline or hydrogel Secondary Dressing Zetuvit Plus 4x8 in Discharge Instruction: Apply over primary dressing as directed. Secured With Compression Wrap Kerlix Roll 4.5x3.1 (in/yd) Discharge Instruction: Apply Kerlix and Coban compression as directed. Coban Self-Adherent Wrap 4x5 (in/yd) Discharge Instruction: Apply over Kerlix as directed. Compression Stockings Add-Ons Electronic Signature(s) Signed: 02/09/2022 3:53:46 PM By: Kalman Shan DO Entered By: Kalman Shan on 02/09/2022 14:37:39 -------------------------------------------------------------------------------- Multi-Disciplinary Care Plan Details Patient Name: Date of Service: Seleta Rhymes T. 02/09/2022 1:15 PM Medical Record Number: [id number] Patient Account Number: [id number] Date of Birth/Sex: Treating RN: 1923/03/13 (88 y.o. [name], [name] Primary Care Tashica Provencio: Elenor Quinones Other Clinician: Referring Isabel Freese: Treating Dillian Feig/Extender: [place] Poli in Treatment: [address], [place] T ([id number])  [id number] Page 6 of 10 Active Inactive Nutrition Nursing Diagnoses: Potential for alteratiion in Nutrition/Potential for imbalanced nutrition Goals: Patient/caregiver agrees to and verbalizes understanding of need to obtain nutritional consultation Date Initiated: 10/20/2021 Target Resolution Date: 02/27/2022 Goal Status: Active Interventions: Provide education on nutrition Treatment Activities: Education provided on Nutrition : 02/02/2022 Patient referred to Primary Care Physician for further nutritional evaluation : 10/20/2021 Notes: Pain, Acute or Chronic Nursing Diagnoses: Pain, acute or chronic: actual or potential Potential alteration in comfort, pain Goals: Patient will verbalize adequate pain control and receive pain control interventions during procedures as needed Date Initiated: 10/20/2021 Target Resolution Date: 02/27/2022 Goal Status: Active Patient/caregiver will verbalize comfort level met Date Initiated: 10/20/2021 Date Inactivated: 11/04/2021 Target Resolution Date: 11/06/2021 Goal Status: Met Interventions: Assess comfort goal upon admission Provide education on pain management Treatment Activities: Administer pain control measures as ordered : 10/20/2021 Notes: Wound/Skin Impairment Nursing Diagnoses: Knowledge deficit related to ulceration/compromised skin integrity Goals: Patient/caregiver will verbalize understanding of skin care regimen Date Initiated: 10/20/2021 Target Resolution  Date: 02/27/2022 Goal Status: Active Interventions: Assess patient/caregiver ability to perform ulcer/skin care regimen upon admission and as needed Assess ulceration(s) every visit Provide education on ulcer and skin care Treatment Activities: Skin care regimen initiated : 10/20/2021 Topical wound management initiated : 10/20/2021 Notes: Electronic Signature(s) Signed: 02/12/2022 5:16:09 PM By: Rhae Hammock RN Entered By: Rhae Hammock on  02/09/2022 14:15:16 [place], Mylisa T ([id number]) [id number] Page 7 of 10 -------------------------------------------------------------------------------- Pain Assessment Details Patient Name: Date of Service: LALLA, LAHAM 02/09/2022 1:15 PM Medical Record Number: [id number] Patient Account Number: [id number] Date of Birth/Sex: Treating RN: 1923/05/07 (88 y.o. [name] Primary Care Jonh Mcqueary: Elenor Quinones Other Clinician: Referring Rayann Jolley: Treating Serigne Kubicek/Extender: [place] Poli in Treatment: 16 Active Problems Location of Pain Severity and Description of Pain Patient Has Paino No Site Locations Rate the pain. Current Pain Level: 0 Pain Management and Medication Current Pain Management: Medication: No Cold Application: No Rest: No Massage: No Activity: No T.E.N.S.: No Heat Application: No Leg drop or elevation: No Is the Current Pain Management Adequate: Adequate How does your wound impact your activities of daily livingo Sleep: No Bathing: No Appetite: No Relationship With Others: No Bladder Continence: No Emotions: No Bowel Continence: No Work: No Toileting: No Drive: No Dressing: No Hobbies: No Engineer, maintenance) Signed: 02/09/2022 4:58:54 PM By: Deon Pilling RN, BSN Entered By: Deon Pilling on 02/09/2022 14:01:40 -------------------------------------------------------------------------------- Patient/Caregiver Education Details Patient Name: Date of Service: Sheliah Hatch 12/11/2023andnbsp1:15 PM Medical Record Number: [id number] Patient Account Number: [id number] Date of Birth/Gender: Treating RN: 07/16/1923 (88 y.o. [name], [name] Primary Care Physician: Elenor Quinones Other Clinician: Referring Physician: Treating Physician/Extender: [place] Poli in Treatment: 16 Education Assessment Education Provided To: [name] ([id number]) [id number] Page 8 of 10 Patient Education Topics Provided Wound/Skin Impairment: Methods: Explain/Verbal Responses: Reinforcements needed, State content correctly Electronic Signature(s) Signed: 02/12/2022 5:16:09 PM By: Rhae Hammock RN Entered By: Rhae Hammock on 02/09/2022 14:15:35 -------------------------------------------------------------------------------- Wound Assessment Details Patient Name: Date of Service: Seleta Rhymes T. 02/09/2022 1:15 PM Medical Record Number: [id number] Patient Account Number: [id number] Date of Birth/Sex: Treating RN: 1923/05/03 (88 y.o. [name], [name] Primary Care Evo Aderman: Elenor Quinones Other Clinician: Referring Duff Pozzi: Treating Syreeta Figler/Extender: [place] Poli in Treatment: 16 Wound Status Wound Number: 1 Primary Etiology: Abrasion Wound Location: Right, Lateral Lower Leg Secondary Lymphedema Etiology: Wounding Event: Trauma Wound Status: Open Date Acquired: 08/04/2021 Comorbid Congestive Heart Failure, Hypertension, Osteoarthritis, Weeks Of Treatment: 16 History: Neuropathy Clustered Wound: No Photos Wound Measurements Length: (cm) 2.7 Width: (cm) 0.8 Depth: (cm) 0.1 Area: (cm) 1.696 Volume: (cm) 0.17 % Reduction in Area: 96.8% % Reduction in Volume: 99.4% Epithelialization: Large (67-100%) Tunneling: No Undermining: No Wound Description Classification: Full Thickness Without Exposed Support Structures Wound Margin: Distinct, outline attached Exudate Amount: Medium Exudate Type: Serosanguineous Exudate Color: red, brown Foul Odor After Cleansing: No Slough/Fibrino No Wound Bed Granulation Amount: Large (67-100%) Exposed Structure Granulation Quality: Red, Pink Fascia Exposed: No Necrotic Amount: None Present (0%) Fat Layer (Subcutaneous Tissue) Exposed: Yes Tendon Exposed: No Muscle Exposed: No Joint Exposed: No Bone  Exposed: No YEVETTE, KNUST T ([id number]) [id number] Page 9 of 10 Periwound Skin Texture Texture Color No Abnormalities Noted: No No Abnormalities Noted: No Callus: No Atrophie Blanche: No Crepitus: No Cyanosis: No Excoriation: No Ecchymosis: No Induration: No Erythema: No Rash: No Hemosiderin Staining: Yes Scarring: Yes Mottled: No Pallor: No Moisture Rubor: No No Abnormalities Noted: No Dry /  Scaly: No Maceration: No Treatment Notes Wound #1 (Lower Leg) Wound Laterality: Right, Lateral Cleanser Soap and Water Discharge Instruction: May shower and wash wound with dial antibacterial soap and water prior to dressing change. Wound Cleanser Discharge Instruction: Cleanse the wound with wound cleanser prior to applying a clean dressing using gauze sponges, not tissue or cotton balls. Peri-Wound Care Zinc Oxide Ointment 30g tube Discharge Instruction: Apply Zinc Oxide to periwound lightly around the periwound. Sween Lotion (Moisturizing lotion) Discharge Instruction: Apply moisturizing lotion as directed Topical Primary Dressing Promogran Prisma Matrix, 4.34 (sq in) (silver collagen) Discharge Instruction: Moisten collagen with saline or hydrogel Secondary Dressing Zetuvit Plus 4x8 in Discharge Instruction: Apply over primary dressing as directed. Secured With Compression Wrap Kerlix Roll 4.5x3.1 (in/yd) Discharge Instruction: Apply Kerlix and Coban compression as directed. Coban Self-Adherent Wrap 4x5 (in/yd) Discharge Instruction: Apply over Kerlix as directed. Compression Stockings Add-Ons Electronic Signature(s) Signed: 02/09/2022 4:58:54 PM By: Deon Pilling RN, BSN Entered By: Deon Pilling on 02/09/2022 14:04:14 -------------------------------------------------------------------------------- Vitals Details Patient Name: Date of Service: Seleta Rhymes T. 02/09/2022 1:15 PM Medical Record Number: [id number] Patient Account Number:  [id number] Date of Birth/Sex: Treating RN: 1923-08-18 (88 y.o. [name], [name] Primary Care Shakera Ebrahimi: Elenor Quinones Other Clinician: Referring Shakinah Navis: Treating Zera Markwardt/Extender: [place] Poli in Treatment: [address], [place] T ([id number]) [id number] Page 10 of 10 Vital Signs Time Taken: 13:55 Temperature (F): 98.4 Height (in): 62 Pulse (bpm): 65 Weight (lbs): 142 Respiratory Rate (breaths/min): 20 Body Mass Index (BMI): 26 Blood Pressure (mmHg): 140/77 Reference Range: 80 - 120 mg / dl Electronic Signature(s) Signed: 02/09/2022 4:58:54 PM By: Deon Pilling RN, BSN Entered By: Deon Pilling on 02/09/2022 14:03:20

## 2022-02-16 ENCOUNTER — Encounter (HOSPITAL_BASED_OUTPATIENT_CLINIC_OR_DEPARTMENT_OTHER): Payer: Medicare Other | Admitting: Internal Medicine

## 2022-02-16 DIAGNOSIS — L97812 Non-pressure chronic ulcer of other part of right lower leg with fat layer exposed: Secondary | ICD-10-CM | POA: Diagnosis not present

## 2022-02-19 NOTE — Progress Notes (Signed)
NEOLA, Vicki [name] ([id number]) [id number] Page 1 of 7 Visit Report for 02/16/2022 Arrival Information Details Patient Name: Date of Service: Vicki [name], BERROA. 02/16/2022 11:00 A M Medical Record Number: [id number] Patient Account Number: [id number] Date of Birth/Sex: Treating RN: Jul 16, 1923 (88 y.o. F) Primary Care Lamorris Knoblock: Elenor Quinones Other Clinician: Referring Willis Holquin: Treating Arvo Ealy/Extender: [place] Poli in Treatment: 79 Visit Information History Since Last Visit Added or deleted any medications: No Patient Arrived: Walker Any new allergies or adverse reactions: No Arrival Time: 11:26 Had a fall or experienced change in No Accompanied By: son activities of daily living that may affect Transfer Assistance: None risk of falls: Patient Identification Verified: Yes Signs or symptoms of abuse/neglect since last visito No Secondary Verification Process Completed: Yes Hospitalized since last visit: No Patient Requires Transmission-Based Precautions: No Implantable device outside of the clinic excluding No Patient Has Alerts: No cellular tissue based products placed in the center since last visit: Has Compression in Place as Prescribed: Yes Pain Present Now: No Electronic Signature(s) Signed: 02/16/2022 4:19:27 PM By: Erenest Blank Entered By: Erenest Blank on 02/16/2022 11:28:20 -------------------------------------------------------------------------------- Encounter Discharge Information Details Patient Name: Date of Service: Vicki Rhymes T. 02/16/2022 11:00 A M Medical Record Number: [id number] Patient Account Number: [id number] Date of Birth/Sex: Treating RN: 07/27/1923 (88 y.o. [name], [name] Primary Care Mychal Durio: Elenor Quinones Other Clinician: Referring Genaro Bekker: Treating Stephanee Barcomb/Extender: [place] Poli in Treatment: 17 Encounter Discharge Information  Items Post Procedure Vitals Discharge Condition: Stable Temperature (F): 98.7 Ambulatory Status: Ambulatory Pulse (bpm): 74 Discharge Destination: Home Respiratory Rate (breaths/min): 17 Transportation: Private Auto Blood Pressure (mmHg): 134/74 Accompanied By: son Schedule Follow-up Appointment: Yes Clinical Summary of Care: Patient Declined Electronic Signature(s) Signed: 02/18/2022 5:39:23 PM By: Rhae Hammock RN Entered By: Rhae Hammock on 02/16/2022 12:01:39 [name] ([id number]) [id number] Page 2 of 7 -------------------------------------------------------------------------------- Lower Extremity Assessment Details Patient Name: Date of Service: Vicki [name], BLACKSON 02/16/2022 11:00 A M Medical Record Number: [id number] Patient Account Number: [id number] Date of Birth/Sex: Treating RN: [birth year] (88 y.o. F) Primary Care Luke Falero: Elenor Quinones Other Clinician: Referring Mylo Driskill: Treating Rayssa Atha/Extender: [place] Poli in Treatment: 17 Edema Assessment Assessed: [Left: No] Patrice Paradise: No] Edema: [Left: N] [Right: o] Calf Left: Right: Point of Measurement: 28 cm From Medial Instep 34.6 cm Ankle Left: Right: Point of Measurement: 12 cm From Medial Instep 21.1 cm Electronic Signature(s) Signed: 02/16/2022 4:19:27 PM By: Erenest Blank Entered By: Erenest Blank on 02/16/2022 11:40:09 -------------------------------------------------------------------------------- Multi Wound Chart Details Patient Name: Date of Service: Vicki Rhymes T. 02/16/2022 11:00 A M Medical Record Number: [id number] Patient Account Number: [id number] Date of Birth/Sex: Treating RN: February 08, 1924 (88 y.o. F) Primary Care Sedonia Kitner: Elenor Quinones Other Clinician: Referring Danese Dorsainvil: Treating Jannifer Fischler/Extender: [place] Poli in Treatment: 17 Vital Signs Height(in): 62 Pulse(bpm):  60 Weight(lbs): 142 Blood Pressure(mmHg): 133/75 Body Mass Index(BMI): 26 Temperature(F): 97.6 Respiratory Rate(breaths/min): 18 [1:Photos:] [N/A:N/A] Right, Lateral Lower Leg N/A N/A Wound Location: Trauma N/A N/A Wounding Event: Abrasion N/A N/A Primary Etiology: Lymphedema N/A N/A Secondary Etiology: Congestive Heart Failure, N/A N/A Comorbid History: Hypertension, Osteoarthritis, ERUM, CERCONE T ([id number]) [id number] Page 3 of 7 Neuropathy 08/04/2021 N/A N/A Date Acquired: 37 N/A N/A Weeks of Treatment: Open N/A N/A Wound Status: No N/A N/A Wound Recurrence: 2.5x0.4x0.1 N/A N/A Measurements L x W x D (cm) 0.785 N/A N/A A (cm) : rea 0.079 N/A N/A Volume (cm) : 98.50% N/A N/A %  Reduction in Area: 99.70% N/A N/A % Reduction in Volume: Full Thickness Without Exposed N/A N/A Classification: Support Structures Medium N/A N/A Exudate Amount: Serosanguineous N/A N/A Exudate Type: red, brown N/A N/A Exudate Color: Distinct, outline attached N/A N/A Wound Margin: None Present (0%) N/A N/A Granulation Amount: Large (67-100%) N/A N/A Necrotic Amount: Fat Layer (Subcutaneous Tissue): Yes N/A N/A Exposed Structures: Fascia: No Tendon: No Muscle: No Joint: No Bone: No Large (67-100%) N/A N/A Epithelialization: Scarring: Yes N/A N/A Periwound Skin Texture: Excoriation: No Induration: No Callus: No Crepitus: No Rash: No Maceration: No N/A N/A Periwound Skin Moisture: Dry/Scaly: No Hemosiderin Staining: Yes N/A N/A Periwound Skin Color: Atrophie Blanche: No Cyanosis: No Ecchymosis: No Erythema: No Mottled: No Pallor: No Rubor: No Treatment Notes Electronic Signature(s) Signed: 02/16/2022 12:14:42 PM By: Kalman Shan DO Entered By: Kalman Shan on 02/16/2022 12:00:50 -------------------------------------------------------------------------------- Multi-Disciplinary Care Plan Details Patient Name: Date of  Service: Vicki Rhymes T. 02/16/2022 11:00 A M Medical Record Number: [id number] Patient Account Number: [id number] Date of Birth/Sex: Treating RN: [birth year] (88 y.o. [name], [name] Primary Care Chera Slivka: Elenor Quinones Other Clinician: Referring Laci Frenkel: Treating Lusine Corlett/Extender: [place] Poli in Treatment: 17 Active Inactive Nutrition Nursing Diagnoses: Potential for alteratiion in Nutrition/Potential for imbalanced nutrition Goals: Patient/caregiver agrees to and verbalizes understanding of need to obtain nutritional consultation Date Initiated: 10/20/2021 Target Resolution Date: 02/27/2022 Goal Status: Active Interventions: AURORAH, SCHLACHTER ([id number]) [id number] Page 4 of 7 Provide education on nutrition Treatment Activities: Education provided on Nutrition : 01/19/2022 Patient referred to Primary Care Physician for further nutritional evaluation : 10/20/2021 Notes: Pain, Acute or Chronic Nursing Diagnoses: Pain, acute or chronic: actual or potential Potential alteration in comfort, pain Goals: Patient will verbalize adequate pain control and receive pain control interventions during procedures as needed Date Initiated: 10/20/2021 Target Resolution Date: 02/27/2022 Goal Status: Active Patient/caregiver will verbalize comfort level met Date Initiated: 10/20/2021 Date Inactivated: 11/04/2021 Target Resolution Date: 11/06/2021 Goal Status: Met Interventions: Assess comfort goal upon admission Provide education on pain management Treatment Activities: Administer pain control measures as ordered : 10/20/2021 Notes: Wound/Skin Impairment Nursing Diagnoses: Knowledge deficit related to ulceration/compromised skin integrity Goals: Patient/caregiver will verbalize understanding of skin care regimen Date Initiated: 10/20/2021 Target Resolution Date: 02/27/2022 Goal Status: Active Interventions: Assess  patient/caregiver ability to perform ulcer/skin care regimen upon admission and as needed Assess ulceration(s) every visit Provide education on ulcer and skin care Treatment Activities: Skin care regimen initiated : 10/20/2021 Topical wound management initiated : 10/20/2021 Notes: Electronic Signature(s) Signed: 02/18/2022 5:39:23 PM By: Rhae Hammock RN Entered By: Rhae Hammock on 02/16/2022 11:59:00 -------------------------------------------------------------------------------- Pain Assessment Details Patient Name: Date of Service: Vicki Rhymes T. 02/16/2022 11:00 A M Medical Record Number: [id number] Patient Account Number: [id number] Date of Birth/Sex: Treating RN: 07/06/1923 (88 y.o. F) Primary Care Jon Lall: Elenor Quinones Other Clinician: Referring Gerrod Maule: Treating Jolene Guyett/Extender: [place] Poli in Treatment: 17 Active Problems Location of Pain Severity and Description of Pain Patient Has Paino No THEA, HOLSHOUSER ([id number]) [id number] Page 5 of 7 Patient Has Paino No Site Locations Pain Management and Medication Current Pain Management: Electronic Signature(s) Signed: 02/16/2022 4:19:27 PM By: Erenest Blank Entered By: Erenest Blank on 02/16/2022 11:28:47 -------------------------------------------------------------------------------- Patient/Caregiver Education Details Patient Name: Date of Service: Vicki [name] 12/18/2023andnbsp11:00 A M Medical Record Number: [id number] Patient Account Number: [id number] Date of Birth/Gender: Treating RN: November 14, 1923 (88 y.o. Benjaman Lobe Primary Care Physician: Elenor Quinones Other Clinician: Referring Physician:  Treating Physician/Extender: [place] Poli in Treatment: 17 Education Assessment Education Provided To: Patient Education Topics Provided Wound/Skin Impairment: Methods:  Explain/Verbal Responses: Reinforcements needed, State content correctly [employer]) Signed: 02/18/2022 5:39:23 PM By: Rhae Hammock RN Entered By: Rhae Hammock on 02/16/2022 11:59:16 -------------------------------------------------------------------------------- Wound Assessment Details Patient Name: Date of Service: Vicki Rhymes T. 02/16/2022 11:00 A M Medical Record Number: [id number] Patient Account Number: [id number] Date of Birth/Sex: Treating RN: Nov 17, 1923 (88 y.o. Vicki [name], Caitlynn T ([id number]) [id number] Page 6 of 7 Primary Care Toy Eisemann: Elenor Quinones Other Clinician: Referring Garlin Batdorf: Treating Moe Graca/Extender: [place] Poli in Treatment: 17 Wound Status Wound Number: 1 Primary Etiology: Abrasion Wound Location: Right, Lateral Lower Leg Secondary Lymphedema Etiology: Wounding Event: Trauma Wound Status: Open Date Acquired: 08/04/2021 Comorbid Congestive Heart Failure, Hypertension, Osteoarthritis, Weeks Of Treatment: 17 History: Neuropathy Clustered Wound: No Photos Wound Measurements Length: (cm) 2.5 Width: (cm) 0.4 Depth: (cm) 0.1 Area: (cm) 0.785 Volume: (cm) 0.079 % Reduction in Area: 98.5% % Reduction in Volume: 99.7% Epithelialization: Large (67-100%) Tunneling: No Undermining: No Wound Description Classification: Full Thickness Without Exposed Support Structures Wound Margin: Distinct, outline attached Exudate Amount: Medium Exudate Type: Serosanguineous Exudate Color: red, brown Foul Odor After Cleansing: No Slough/Fibrino No Wound Bed Granulation Amount: None Present (0%) Exposed Structure Necrotic Amount: Large (67-100%) Fascia Exposed: No Necrotic Quality: Adherent Slough Fat Layer (Subcutaneous Tissue) Exposed: Yes Tendon Exposed: No Muscle Exposed: No Joint Exposed: No Bone Exposed: No Periwound Skin Texture Texture Color No Abnormalities  Noted: No No Abnormalities Noted: No Callus: No Atrophie Blanche: No Crepitus: No Cyanosis: No Excoriation: No Ecchymosis: No Induration: No Erythema: No Rash: No Hemosiderin Staining: Yes Scarring: Yes Mottled: No Pallor: No Moisture Rubor: No No Abnormalities Noted: No Dry / Scaly: No Maceration: No Treatment Notes Wound #1 (Lower Leg) Wound Laterality: Right, Lateral Cleanser Soap and Water Discharge Instruction: May shower and wash wound with dial antibacterial soap and water prior to dressing change. Wound Cleanser Discharge Instruction: Cleanse the wound with wound cleanser prior to applying a clean dressing using gauze sponges, not tissue or cotton balls. SHARNAY, CASHION ([id number]) [id number] Page 7 of 7 Peri-Wound Care Zinc Oxide Ointment 30g tube Discharge Instruction: Apply Zinc Oxide to periwound lightly around the periwound. Sween Lotion (Moisturizing lotion) Discharge Instruction: Apply moisturizing lotion as directed Topical Primary Dressing Promogran Prisma Matrix, 4.34 (sq in) (silver collagen) Discharge Instruction: Moisten collagen with saline or hydrogel Secondary Dressing Zetuvit Plus 4x8 in Discharge Instruction: Apply over primary dressing as directed. Secured With Compression Wrap Kerlix Roll 4.5x3.1 (in/yd) Discharge Instruction: Apply Kerlix and Coban compression as directed. Coban Self-Adherent Wrap 4x5 (in/yd) Discharge Instruction: Apply over Kerlix as directed. Compression Stockings Add-Ons Electronic Signature(s) Signed: 02/16/2022 4:19:27 PM By: Erenest Blank Entered By: Erenest Blank on 02/16/2022 11:41:30 -------------------------------------------------------------------------------- Vitals Details Patient Name: Date of Service: Vicki Rhymes T. 02/16/2022 11:00 A M Medical Record Number: [id number] Patient Account Number: [id number] Date of Birth/Sex: Treating RN: 04/13/1923 (88 y.o.  F) Primary Care Oather Muilenburg: Elenor Quinones Other Clinician: Referring Syd Newsome: Treating Ina Poupard/Extender: [place] Poli in Treatment: 17 Vital Signs Time Taken: 11:28 Temperature (F): 97.6 Height (in): 62 Pulse (bpm): 60 Weight (lbs): 142 Respiratory Rate (breaths/min): 18 Body Mass Index (BMI): 26 Blood Pressure (mmHg): 133/75 Reference Range: 80 - 120 mg / dl Electronic Signature(s) Signed: 02/16/2022 4:19:27 PM By: Erenest Blank Entered By: Erenest Blank on 02/16/2022 11:28:41

## 2022-02-19 NOTE — Progress Notes (Signed)
JAELIE, AGUILERA ([id number]) [id number] Page 1 of 10 Visit Report for 02/16/2022 Chief Complaint Document Details Patient Name: Date of Service: Vicki [name], Vicki [name]. 02/16/2022 11:00 A M Medical Record Number: [id number] Patient Account Number: [id number] Date of Birth/Sex: Treating RN: 11/04/1923 (88 y.o. F) Primary Care Provider: Pricilla Holm Other Clinician: Referring Provider: Treating Provider/Extender: Elicia Lamp in Treatment: 17 Information Obtained from: Patient Chief Complaint 10/20/2021; right lower extremity wound status post trauma Electronic Signature(s) Signed: 02/16/2022 12:14:42 PM By: Geralyn Corwin DO Entered By: Geralyn Corwin on 02/16/2022 12:00:57 -------------------------------------------------------------------------------- Debridement Details Patient Name: Date of Service: Vicki [name] 02/16/2022 11:00 A M Medical Record Number: [id number] Patient Account Number: [id number] Date of Birth/Sex: Treating RN: 05/13/1923 (88 y.o. Ardis Rowan, [name] Primary Care Provider: Pricilla Holm Other Clinician: Referring Provider: Treating Provider/Extender: Elicia Lamp in Treatment: 17 Debridement Performed for Assessment: Wound #1 Right,Lateral Lower Leg Performed By: Physician Geralyn Corwin, DO Debridement Type: Debridement Level of Consciousness (Pre-procedure): Awake and Alert Pre-procedure Verification/Time Out Yes - 12:00 Taken: Start Time: 12:00 Pain Control: Lidocaine T Area Debrided (L x W): otal 2.5 (cm) x 0.4 (cm) = 1 (cm) Tissue and other material debrided: Viable, Non-Viable, Slough, Subcutaneous, Slough Level: Skin/Subcutaneous Tissue Debridement Description: Excisional Instrument: Curette Bleeding: Minimum Hemostasis Achieved: Pressure End Time: 12:00 Procedural Pain: 0 Post Procedural Pain: 0 Response to Treatment: Procedure  was tolerated well Level of Consciousness (Post- Awake and Alert procedure): Post Debridement Measurements of Total Wound Length: (cm) 2.5 Width: (cm) 0.4 Depth: (cm) 0.1 Volume: (cm) 0.079 Character of Wound/Ulcer Post Debridement: Improved Post Procedure Diagnosis KAARIN, PARDY T ([id number]) [id number] Page 2 of 10 Same as Pre-procedure Electronic Signature(s) Signed: 02/16/2022 12:14:42 PM By: Geralyn Corwin DO Signed: 02/18/2022 5:39:23 PM By: Fonnie Mu RN Entered By: Fonnie Mu on 02/16/2022 12:00:55 -------------------------------------------------------------------------------- HPI Details Patient Name: Date of Service: Vicki [name] 02/16/2022 11:00 A M Medical Record Number: [id number] Patient Account Number: [id number] Date of Birth/Sex: Treating RN: [birth year] (88 y.o. F) Primary Care Provider: Pricilla Holm Other Clinician: Referring Provider: Treating Provider/Extender: Elicia Lamp in Treatment: 17 History of Present Illness HPI Description: Admission 10/20/2021 Vicki [name] is a 88 year old female with a past medical history of hypothyroidism and essential hypertension that presents to the clinic for a 62-month history of ulcer to the right lower extremity. On 08/15/2021 the patient experienced a mechanical fall and hit her right leg against her table. She developed a hematoma and she was evaluated in the ED for this issue. The hematoma was evacuated in the ED. She had x-rays of the tibia/fibula and ankle that showed no acute osseous abnormalities. She has been using silver alginate to the wound bed. She did have 2 rounds of doxycycline for this issue and recently completed her second course. She currently denies signs of infection. 8/28; patient presents for follow-up. She has no issues or complaints today. We have been doing Santyl and Hydrofera Blue under Kerlix/Coban. She  has home health that they start coming out once weekly. She denies signs of infection. 9/5; patient presents for follow-up. We have been doing Hydrofera Blue with antibiotic ointment under Kerlix/Coban. She has no issues or complaints today. 9/11; patient presents for follow-up. We have been doing Hydrofera Blue under Kerlix/Coban. Home health has been changing the dressing weekly. The wrap was too tight at the more proximal and cutting into the patient's skin. Other than that she has no  issues or complaints today. 9/18; patient presents for follow-up. We have been using PolyMem silver under Kerlix/Coban for the past week. She has no issues or complaints today. 9/25; patient presents for follow-up. We have been using PolyMem silver under Kerlix/Coban. She has no issues or complaints today. 10/2; patient presents for follow-up. We have been using PolyMem silver with antibiotic ointment under compression therapy. Patient has no issues or complaints today. 10/9; patient presents for follow-up. We have been using PolyMem silver under compression therapy. Patient has no issues or complaints today. She reports improvement in wound healing. 10/16; patient presents for follow-up. We have been using PolyMem silver with antibiotic ointment under compression therapy. Patient has no issues or complaints today. 10/23; patient presents for follow-up. We have been using PolyMem silver with antibiotic ointment under Kerlix/Coban. She has no issues or complaints today. 10/30; patient presents for follow-up. We have been using PolyMem silver with antibiotic ointment under Kerlix/Coban. Her wound continues to show signs of healing. 11/13; patient presents for follow-up. We have been using PolyMem silver with antibiotic ointment under Kerlix/Coban. She has more hyper granulated tissue and nonviable tissue today. She denies signs of infection. 11/20; patient presents for follow-up. We have been using Hydrofera Blue  with antibiotic ointment under compression therapy. Patient states she is tolerating this well. She denies signs of infection. 11/27; patient presents for follow-up. We have been using Hydrofera Blue with antibiotic ointment under compression therapy. She has made significant improvement in wound healing over the past week. She has no issues or complaints today. 12/4; patient presents for follow-up. We have been using Hydrofera Blue with antibiotic under compression therapy. She continues to improve and her wound healing. She has no issues or complaints today. 12/11; patient presents for follow-up. We have been using Hydrofera Blue with antibiotic ointment under compression therapy. Her wound is smaller today. She has no issues or complaints. 12/18; patient presents for follow-up. We have been using collagen under Kerlix/Coban. She has no issues or complaints today. There is been improvement in wound healing. Electronic Signature(s) Signed: 02/16/2022 12:14:42 PM By: Geralyn Corwin DO Entered By: Geralyn Corwin on 02/16/2022 12:02:08 Ander Purpura ([id number]) [id number] Page 3 of 10 -------------------------------------------------------------------------------- Physical Exam Details Patient Name: Date of Service: ELLIOT, SIMONEAUX. 02/16/2022 11:00 A M Medical Record Number: [id number] Patient Account Number: [id number] Date of Birth/Sex: Treating RN: September 07, 1923 (88 y.o. F) Primary Care Provider: Pricilla Holm Other Clinician: Referring Provider: Treating Provider/Extender: Elicia Lamp in Treatment: 17 Constitutional respirations regular, non-labored and within target range for patient.. Cardiovascular 2+ dorsalis pedis/posterior tibialis pulses. Psychiatric pleasant and cooperative. Notes Right lower extremity: T the anterior aspect there is an open wound with granulation tissue and non viable tissue. Good  edema control. Venous stasis o dermatitis. Electronic Signature(s) Signed: 02/16/2022 12:14:42 PM By: Geralyn Corwin DO Entered By: Geralyn Corwin on 02/16/2022 12:02:50 -------------------------------------------------------------------------------- Physician Orders Details Patient Name: Date of Service: Vicki [name] 02/16/2022 11:00 A M Medical Record Number: [id number] Patient Account Number: [id number] Date of Birth/Sex: Treating RN: 01/05/1924 (88 y.o. Ardis Rowan, [name] Primary Care Provider: Pricilla Holm Other Clinician: Referring Provider: Treating Provider/Extender: Elicia Lamp in Treatment: [phone] Verbal / Phone Orders: No Diagnosis Coding ICD-10 Coding Code Description [phone] Non-pressure chronic ulcer of other part of right lower leg with fat layer exposed T79.8XXA Other early complications of trauma, initial encounter E03.9 Hypothyroidism, unspecified I87.311 Chronic venous hypertension (idiopathic) with ulcer of right lower  extremity Follow-up Appointments ppointment in 1 week. - w/ Dr. Mikey Bussing Return A ppointment in 2 weeks. - Dr. Mikey Bussing Return A Anesthetic (In clinic) Topical Lidocaine 5% applied to wound bed (In clinic) Topical Lidocaine 4% applied to wound bed Bathing/ Shower/ Hygiene May shower with protection but do not get wound dressing(s) wet. Edema Control - Lymphedema / SCD / Other Elevate legs to the level of the heart or above for 30 minutes daily and/or when sitting, a frequency of: - 3-4 times a day throughout the day. LENNETTE, FADER ([id number]) [id number] Page 4 of 10 Avoid standing for long periods of time. Exercise regularly Non Wound Condition Other Non Wound Condition Orders/Instructions: - Put xeroform to small skin tear right above wound for protection Wound Treatment Wound #1 - Lower Leg Wound Laterality: Right, Lateral Cleanser: Soap and Water 1 x Per  Week/30 Days Discharge Instructions: May shower and wash wound with dial antibacterial soap and water prior to dressing change. Cleanser: Wound Cleanser 1 x Per Week/30 Days Discharge Instructions: Cleanse the wound with wound cleanser prior to applying a clean dressing using gauze sponges, not tissue or cotton balls. Peri-Wound Care: Zinc Oxide Ointment 30g tube 1 x Per Week/30 Days Discharge Instructions: Apply Zinc Oxide to periwound lightly around the periwound. Peri-Wound Care: Sween Lotion (Moisturizing lotion) 1 x Per Week/30 Days Discharge Instructions: Apply moisturizing lotion as directed Prim Dressing: Promogran Prisma Matrix, 4.34 (sq in) (silver collagen) 1 x Per Week/30 Days ary Discharge Instructions: Moisten collagen with saline or hydrogel Secondary Dressing: Zetuvit Plus 4x8 in 1 x Per Week/30 Days Discharge Instructions: Apply over primary dressing as directed. Compression Wrap: Kerlix Roll 4.5x3.1 (in/yd) (Home Health) 1 x Per Week/30 Days Discharge Instructions: Apply Kerlix and Coban compression as directed. Compression Wrap: Coban Self-Adherent Wrap 4x5 (in/yd) (Home Health) 1 x Per Week/30 Days Discharge Instructions: Apply over Kerlix as directed. Electronic Signature(s) Signed: 02/16/2022 12:14:42 PM By: Geralyn Corwin DO Entered By: Geralyn Corwin on 02/16/2022 12:02:57 -------------------------------------------------------------------------------- Problem List Details Patient Name: Date of Service: Vicki [name] 02/16/2022 11:00 A M Medical Record Number: [id number] Patient Account Number: [id number] Date of Birth/Sex: Treating RN: February 03, 1924 (88 y.o. F) Primary Care Provider: Pricilla Holm Other Clinician: Referring Provider: Treating Provider/Extender: Elicia Lamp in Treatment: 17 Active Problems ICD-10 Encounter Code Description Active Date MDM Diagnosis L97.812 Non-pressure chronic ulcer of other part  of right lower leg with fat layer 10/20/2021 No Yes exposed T79.8XXA Other early complications of trauma, initial encounter 10/20/2021 No Yes E03.9 Hypothyroidism, unspecified 10/20/2021 No Yes I87.311 Chronic venous hypertension (idiopathic) with ulcer of right lower extremity 10/20/2021 No Yes AHSLEY, ATTWOOD ([id number]) [id number] Page 5 of 10 Inactive Problems Resolved Problems Electronic Signature(s) Signed: 02/16/2022 12:14:42 PM By: Geralyn Corwin DO Entered By: Geralyn Corwin on 02/16/2022 12:00:45 -------------------------------------------------------------------------------- Progress Note Details Patient Name: Date of Service: Vicki [name] 02/16/2022 11:00 A M Medical Record Number: [id number] Patient Account Number: [id number] Date of Birth/Sex: Treating RN: 02-04-1924 (88 y.o. F) Primary Care Provider: Pricilla Holm Other Clinician: Referring Provider: Treating Provider/Extender: Elicia Lamp in Treatment: 17 Subjective Chief Complaint Information obtained from Patient 10/20/2021; right lower extremity wound status post trauma History of Present Illness (HPI) Admission 10/20/2021 Ms. Juliet Vasbinder is a 88 year old female with a past medical history of hypothyroidism and essential hypertension that presents to the clinic for a 42-month history of ulcer to the right lower extremity. On 08/15/2021 the patient experienced a  mechanical fall and hit her right leg against her table. She developed a hematoma and she was evaluated in the ED for this issue. The hematoma was evacuated in the ED. She had x-rays of the tibia/fibula and ankle that showed no acute osseous abnormalities. She has been using silver alginate to the wound bed. She did have 2 rounds of doxycycline for this issue and recently completed her second course. She currently denies signs of infection. 8/28; patient presents for follow-up. She has  no issues or complaints today. We have been doing Santyl and Hydrofera Blue under Kerlix/Coban. She has home health that they start coming out once weekly. She denies signs of infection. 9/5; patient presents for follow-up. We have been doing Hydrofera Blue with antibiotic ointment under Kerlix/Coban. She has no issues or complaints today. 9/11; patient presents for follow-up. We have been doing Hydrofera Blue under Kerlix/Coban. Home health has been changing the dressing weekly. The wrap was too tight at the more proximal and cutting into the patient's skin. Other than that she has no issues or complaints today. 9/18; patient presents for follow-up. We have been using PolyMem silver under Kerlix/Coban for the past week. She has no issues or complaints today. 9/25; patient presents for follow-up. We have been using PolyMem silver under Kerlix/Coban. She has no issues or complaints today. 10/2; patient presents for follow-up. We have been using PolyMem silver with antibiotic ointment under compression therapy. Patient has no issues or complaints today. 10/9; patient presents for follow-up. We have been using PolyMem silver under compression therapy. Patient has no issues or complaints today. She reports improvement in wound healing. 10/16; patient presents for follow-up. We have been using PolyMem silver with antibiotic ointment under compression therapy. Patient has no issues or complaints today. 10/23; patient presents for follow-up. We have been using PolyMem silver with antibiotic ointment under Kerlix/Coban. She has no issues or complaints today. 10/30; patient presents for follow-up. We have been using PolyMem silver with antibiotic ointment under Kerlix/Coban. Her wound continues to show signs of healing. 11/13; patient presents for follow-up. We have been using PolyMem silver with antibiotic ointment under Kerlix/Coban. She has more hyper granulated tissue and nonviable tissue today. She  denies signs of infection. 11/20; patient presents for follow-up. We have been using Hydrofera Blue with antibiotic ointment under compression therapy. Patient states she is tolerating this well. She denies signs of infection. 11/27; patient presents for follow-up. We have been using Hydrofera Blue with antibiotic ointment under compression therapy. She has made significant improvement in wound healing over the past week. She has no issues or complaints today. 12/4; patient presents for follow-up. We have been using Hydrofera Blue with antibiotic under compression therapy. She continues to improve and her wound healing. She has no issues or complaints today. 12/11; patient presents for follow-up. We have been using Hydrofera Blue with antibiotic ointment under compression therapy. Her wound is smaller today. She has no issues or complaints. 12/18; patient presents for follow-up. We have been using collagen under Kerlix/Coban. She has no issues or complaints today. There is been improvement in [place], [place] T ([id number]) [id number] Page 6 of 10 wound healing. Patient History Information obtained from Patient. Family History Diabetes - Child, Heart Disease - Child,Mother,Father,Siblings, Hypertension - Father,Mother, No family history of Cancer, Hereditary Spherocytosis, Kidney Disease, Lung Disease, Seizures, Stroke, Thyroid Problems, Tuberculosis. Social History Never smoker, Marital Status - Widowed, Alcohol Use - Never, Drug Use - No History, Caffeine Use - Rarely. Medical History Eyes  Denies history of Cataracts, Glaucoma, Optic Neuritis Ear/Nose/Mouth/Throat Denies history of Chronic sinus problems/congestion, Middle ear problems Hematologic/Lymphatic Denies history of Anemia, Hemophilia, Human Immunodeficiency Virus, Lymphedema, Sickle Cell Disease Respiratory Denies history of Aspiration, Asthma, Chronic Obstructive Pulmonary Disease (COPD),  Pneumothorax, Sleep Apnea, Tuberculosis Cardiovascular Patient has history of Congestive Heart Failure, Hypertension Denies history of Angina, Arrhythmia, Coronary Artery Disease, Deep Vein Thrombosis, Hypotension, Myocardial Infarction, Peripheral Arterial Disease, Peripheral Venous Disease, Phlebitis, Vasculitis Gastrointestinal Denies history of Cirrhosis , Colitis, Crohnoos, Hepatitis A, Hepatitis B, Hepatitis C Endocrine Denies history of Type I Diabetes, Type II Diabetes Immunological Denies history of Lupus Erythematosus, Raynaudoos, Scleroderma Integumentary (Skin) Denies history of History of Burn Musculoskeletal Patient has history of Osteoarthritis Denies history of Gout, Rheumatoid Arthritis, Osteomyelitis Neurologic Patient has history of Neuropathy Denies history of Dementia, Quadriplegia, Paraplegia, Seizure Disorder Psychiatric Denies history of Anorexia/bulimia, Confinement Anxiety Hospitalization/Surgery History - back surgery. - right hip surgery. - hysterotomy. Medical A Surgical History Notes nd Ear/Nose/Mouth/Throat hearing aides Genitourinary Stage three Objective Constitutional respirations regular, non-labored and within target range for patient.. Vitals Time Taken: 11:28 AM, Height: 62 in, Weight: 142 lbs, BMI: 26, Temperature: 97.6 F, Pulse: 60 bpm, Respiratory Rate: 18 breaths/min, Blood Pressure: 133/75 mmHg. Cardiovascular 2+ dorsalis pedis/posterior tibialis pulses. Psychiatric pleasant and cooperative. General Notes: Right lower extremity: T the anterior aspect there is an open wound with granulation tissue and non viable tissue. Good edema control. Venous o stasis dermatitis. Integumentary (Hair, Skin) Wound #1 status is Open. Original cause of wound was Trauma. The date acquired was: 08/04/2021. The wound has been in treatment 17 weeks. The wound is located on the Right,Lateral Lower Leg. The wound measures 2.5cm length x 0.4cm width x  0.1cm depth; 0.785cm^2 area and 0.079cm^3 volume. There is Fat Layer (Subcutaneous Tissue) exposed. There is no tunneling or undermining noted. There is a medium amount of serosanguineous drainage noted. The wound margin is distinct with the outline attached to the wound base. There is no granulation within the wound bed. There is a large (67-100%) amount of necrotic tissue within the wound bed including Adherent Slough. The periwound skin appearance exhibited: Scarring, Hemosiderin Staining. The periwound skin appearance did not exhibit: Callus, Crepitus, Excoriation, Induration, Rash, Dry/Scaly, Maceration, Atrophie Blanche, Cyanosis, Ecchymosis, Mottled, Pallor, Rubor, Erythema. JANIEL, DERHAMMER ([id number]) [id number] Page 7 of 10 Assessment Active Problems ICD-10 Non-pressure chronic ulcer of other part of right lower leg with fat layer exposed Other early complications of trauma, initial encounter Hypothyroidism, unspecified Chronic venous hypertension (idiopathic) with ulcer of right lower extremity Patient's wound has shown improvement in size appearance since last clinic visit. I debrided nonviable tissue. I recommended continue the course with collagen under Kerlix/Coban. Follow-up in 1 week. Procedures Wound #1 Pre-procedure diagnosis of Wound #1 is an Abrasion located on the Right,Lateral Lower Leg . There was a Excisional Skin/Subcutaneous Tissue Debridement with a total area of 1 sq cm performed by Geralyn Corwin, DO. With the following instrument(s): Curette to remove Viable and Non-Viable tissue/material. Material removed includes Subcutaneous Tissue and Slough and after achieving pain control using Lidocaine. No specimens were taken. A time out was conducted at 12:00, prior to the start of the procedure. A Minimum amount of bleeding was controlled with Pressure. The procedure was tolerated well with a pain level of 0 throughout and a pain level of 0  following the procedure. Post Debridement Measurements: 2.5cm length x 0.4cm width x 0.1cm depth; 0.079cm^3 volume. Character of Wound/Ulcer Post Debridement is improved.  Post procedure Diagnosis Wound #1: Same as Pre-Procedure Plan Follow-up Appointments: Return Appointment in 1 week. - w/ Dr. Mikey Bussing Return Appointment in 2 weeks. - Dr. Mikey Bussing Anesthetic: (In clinic) Topical Lidocaine 5% applied to wound bed (In clinic) Topical Lidocaine 4% applied to wound bed Bathing/ Shower/ Hygiene: May shower with protection but do not get wound dressing(s) wet. Edema Control - Lymphedema / SCD / Other: Elevate legs to the level of the heart or above for 30 minutes daily and/or when sitting, a frequency of: - 3-4 times a day throughout the day. Avoid standing for long periods of time. Exercise regularly Non Wound Condition: Other Non Wound Condition Orders/Instructions: - Put xeroform to small skin tear right above wound for protection WOUND #1: - Lower Leg Wound Laterality: Right, Lateral Cleanser: Soap and Water 1 x Per Week/30 Days Discharge Instructions: May shower and wash wound with dial antibacterial soap and water prior to dressing change. Cleanser: Wound Cleanser 1 x Per Week/30 Days Discharge Instructions: Cleanse the wound with wound cleanser prior to applying a clean dressing using gauze sponges, not tissue or cotton balls. Peri-Wound Care: Zinc Oxide Ointment 30g tube 1 x Per Week/30 Days Discharge Instructions: Apply Zinc Oxide to periwound lightly around the periwound. Peri-Wound Care: Sween Lotion (Moisturizing lotion) 1 x Per Week/30 Days Discharge Instructions: Apply moisturizing lotion as directed Prim Dressing: Promogran Prisma Matrix, 4.34 (sq in) (silver collagen) 1 x Per Week/30 Days ary Discharge Instructions: Moisten collagen with saline or hydrogel Secondary Dressing: Zetuvit Plus 4x8 in 1 x Per Week/30 Days Discharge Instructions: Apply over primary dressing as  directed. Com pression Wrap: Kerlix Roll 4.5x3.1 (in/yd) (Home Health) 1 x Per Week/30 Days Discharge Instructions: Apply Kerlix and Coban compression as directed. Com pression Wrap: Coban Self-Adherent Wrap 4x5 (in/yd) (Home Health) 1 x Per Week/30 Days Discharge Instructions: Apply over Kerlix as directed. 1. In office sharp debridement 2. Collagen under Kerlix/Cobanooright lower extremity 3. Follow-up in 1 week Electronic Signature(s) Signed: 02/16/2022 12:14:42 PM By: Geralyn Corwin DO Entered By: Geralyn Corwin on 02/16/2022 12:03:35 Ander Purpura ([id number]) [id number] Page 8 of 10 -------------------------------------------------------------------------------- HxROS Details Patient Name: Date of Service: WALDINE, ZENZ. 02/16/2022 11:00 A M Medical Record Number: [id number] Patient Account Number: [id number] Date of Birth/Sex: Treating RN: [birth year] (88 y.o. F) Primary Care Provider: Pricilla Holm Other Clinician: Referring Provider: Treating Provider/Extender: Elicia Lamp in Treatment: 17 Information Obtained From Patient Eyes Medical History: Negative for: Cataracts; Glaucoma; Optic Neuritis Ear/Nose/Mouth/Throat Medical History: Negative for: Chronic sinus problems/congestion; Middle ear problems Past Medical History Notes: hearing aides Hematologic/Lymphatic Medical History: Negative for: Anemia; Hemophilia; Human Immunodeficiency Virus; Lymphedema; Sickle Cell Disease Respiratory Medical History: Negative for: Aspiration; Asthma; Chronic Obstructive Pulmonary Disease (COPD); Pneumothorax; Sleep Apnea; Tuberculosis Cardiovascular Medical History: Positive for: Congestive Heart Failure; Hypertension Negative for: Angina; Arrhythmia; Coronary Artery Disease; Deep Vein Thrombosis; Hypotension; Myocardial Infarction; Peripheral Arterial Disease; Peripheral Venous Disease; Phlebitis;  Vasculitis Gastrointestinal Medical History: Negative for: Cirrhosis ; Colitis; Crohns; Hepatitis A; Hepatitis B; Hepatitis C Endocrine Medical History: Negative for: Type I Diabetes; Type II Diabetes Genitourinary Medical History: Past Medical History Notes: Stage three Immunological Medical History: Negative for: Lupus Erythematosus; Raynauds; Scleroderma Integumentary (Skin) Medical History: Negative for: History of Burn Musculoskeletal Medical History: Positive for: Osteoarthritis Negative for: Gout; Rheumatoid Arthritis; Osteomyelitis SHAYNNA, HUSBY T ([id number]) [id number] Page 9 of 10 Neurologic Medical History: Positive for: Neuropathy Negative for: Dementia; Quadriplegia; Paraplegia; Seizure Disorder Psychiatric Medical History: Negative for: Anorexia/bulimia;  Confinement Anxiety Immunizations Pneumococcal Vaccine: Received Pneumococcal Vaccination: Yes Received Pneumococcal Vaccination On or After 60th Birthday: Yes Implantable Devices Yes Hospitalization / Surgery History Type of Hospitalization/Surgery back surgery right hip surgery hysterotomy Family and Social History Cancer: No; Diabetes: Yes - Child; Heart Disease: Yes - Child,Mother,Father,Siblings; Hereditary Spherocytosis: No; Hypertension: Yes - Father,Mother; Kidney Disease: No; Lung Disease: No; Seizures: No; Stroke: No; Thyroid Problems: No; Tuberculosis: No; Never smoker; Marital Status - Widowed; Alcohol Use: Never; Drug Use: No History; Caffeine Use: Rarely; Financial Concerns: No; Food, Clothing or Shelter Needs: No; Support System Lacking: No; Transportation Concerns: No Electronic Signature(s) Signed: 02/16/2022 12:14:42 PM By: Geralyn CorwinHoffman, Lulubelle Simcoe DO Entered By: Geralyn CorwinHoffman, Breaunna Gottlieb on 02/16/2022 12:02:14 -------------------------------------------------------------------------------- SuperBill Details Patient Name: Date of Service: Vicki PresumeA LDERMA N, Holleigh T.  02/16/2022 Medical Record Number: [id number] Patient Account Number: [id number] Date of Birth/Sex: Treating RN: [birth year] (88 y.o. 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 Facility Procedures : CPT4 Code: [id number] Description: 11042 - DEB SUBQ TISSUE 20 SQ CM/< ICD-10 Diagnosis Description L97.812 Non-pressure chronic ulcer of other part of right lower leg with fat layer exp Modifier: osed Quantity: 1 Physician Procedures : CPT4 Code Description Modifier [id number] 11042 - WC PHYS SUBQ TISS 20 SQ CM Ander PurpuraLDERMAN, Aradhana T ([id number]) [id number] ICD-10 Diagnosis Description L97.812 Non-pressure chronic ulcer of other part of right lower leg with fat layer  exposed Quantity: 1 7.pdf Page 10 of 10 Electronic Signature(s) Signed: 02/16/2022 12:14:42 PM By: Geralyn CorwinHoffman, Juwaun Inskeep DO Entered By: Geralyn CorwinHoffman, Emmarie Sannes on 02/16/2022 12:03:41

## 2022-02-27 ENCOUNTER — Encounter (HOSPITAL_BASED_OUTPATIENT_CLINIC_OR_DEPARTMENT_OTHER): Payer: Medicare Other | Admitting: Internal Medicine

## 2022-02-27 DIAGNOSIS — E039 Hypothyroidism, unspecified: Secondary | ICD-10-CM | POA: Diagnosis not present

## 2022-02-27 DIAGNOSIS — I87311 Chronic venous hypertension (idiopathic) with ulcer of right lower extremity: Secondary | ICD-10-CM | POA: Diagnosis not present

## 2022-02-27 DIAGNOSIS — L97812 Non-pressure chronic ulcer of other part of right lower leg with fat layer exposed: Secondary | ICD-10-CM | POA: Diagnosis not present

## 2022-02-27 DIAGNOSIS — T798XXA Other early complications of trauma, initial encounter: Secondary | ICD-10-CM | POA: Diagnosis not present

## 2022-02-27 NOTE — Progress Notes (Signed)
NOELA, BROTHERS ([id number]) [id number] Page 1 of 8 Visit Report for 02/27/2022 Arrival Information Details Patient Name: Date of Service: Vicki [name], Vicki [name]. 02/27/2022 11:15 A M Medical Record Number: [id number] Patient Account Number: [id number] Date of Birth/Sex: Treating RN: 1923-10-15 (88 y.o. F) Primary Care Lasandra Batley: Pricilla Holm Other Clinician: Referring Linsay Vogt: Treating Keona Bilyeu/Extender: Elicia Lamp in Treatment: 18 Visit Information History Since Last Visit Added or deleted any medications: No Patient Arrived: Walker Any new allergies or adverse reactions: No Arrival Time: 11:13 Had a fall or experienced change in No Accompanied By: son and daughter in law activities of daily living that may affect Transfer Assistance: None risk of falls: Patient Identification Verified: Yes Signs or symptoms of abuse/neglect since last visito No Secondary Verification Process Completed: Yes Hospitalized since last visit: No Patient Requires Transmission-Based Precautions: No Implantable device outside of the clinic excluding No Patient Has Alerts: No cellular tissue based products placed in the center since last visit: Has Compression in Place as Prescribed: Yes Pain Present Now: No Electronic Signature(s) Signed: 02/27/2022 1:42:35 PM By: Thayer Dallas Entered By: Thayer Dallas on 02/27/2022 11:14:18 -------------------------------------------------------------------------------- Clinic Level of Care Assessment Details Patient Name: Date of Service: Vicki [name], Vicki [name] 02/27/2022 11:15 A M Medical Record Number: [id number] Patient Account Number: [id number] Date of Birth/Sex: Treating RN: March 13, 1923 (88 y.o. [name] Primary Care Makendra Vigeant: Pricilla Holm Other Clinician: Referring Evangela Heffler: Treating Darrie Macmillan/Extender: Elicia Lamp in Treatment: 18 Clinic Level of  Care Assessment Items TOOL 4 Quantity Score X- 1 0 Use when only an EandM is performed on FOLLOW-UP visit ASSESSMENTS - Nursing Assessment / Reassessment X- 1 10 Reassessment of Co-morbidities (includes updates in patient status) X- 1 5 Reassessment of Adherence to Treatment Plan ASSESSMENTS - Wound and Skin A ssessment / Reassessment X - Simple Wound Assessment / Reassessment - one wound 1 5 []  - 0 Complex Wound Assessment / Reassessment - multiple wounds X- 1 10 Dermatologic / Skin Assessment (not related to wound area) ASSESSMENTS - Focused Assessment X- 1 5 Circumferential Edema Measurements - multi extremities []  - 0 Nutritional Assessment / Counseling / Intervention BRAYLYN, EYE [name] ( ) [id number] Page 2 of 8 []  - 0 Lower Extremity Assessment (monofilament, tuning fork, pulses) []  - 0 Peripheral Arterial Disease Assessment (using hand held doppler) ASSESSMENTS - Ostomy and/or Continence Assessment and Care []  - 0 Incontinence Assessment and Management []  - 0 Ostomy Care Assessment and Management (repouching, etc.) PROCESS - Coordination of Care X - Simple Patient / Family Education for ongoing care 1 15 []  - 0 Complex (extensive) Patient / Family Education for ongoing care X- 1 10 Staff obtains [id number], Records, [name] Results / Process Orders est []  - 0 Staff telephones HHA, Nursing Homes / Clarify orders / etc []  - 0 Routine Transfer to another Facility (non-emergent condition) []  - 0 Routine Hospital Admission (non-emergent condition) []  - 0 New Admissions / 02-12-2004 / Ordering NPWT Apligraf, etc. , []  - 0 Emergency Hospital Admission (emergent condition) X- 1 10 Simple Discharge Coordination []  - 0 Complex (extensive) Discharge Coordination PROCESS - Special Needs []  - 0 Pediatric / Minor Patient Management []  - 0 Isolation Patient Management []  - 0 Hearing / Language / Visual special needs []  -  0 Assessment of Community assistance (transportation, D/C planning, etc.) []  - 0 Additional assistance / Altered mentation []  - 0 Support Surface(s) Assessment (bed, cushion, seat, etc.) INTERVENTIONS - Wound Cleansing / Measurement X -  Simple Wound Cleansing - one wound 1 5 []  - 0 Complex Wound Cleansing - multiple wounds X- 1 5 Wound Imaging (photographs - any number of wounds) []  - 0 Wound Tracing (instead of photographs) X- 1 5 Simple Wound Measurement - one wound []  - 0 Complex Wound Measurement - multiple wounds INTERVENTIONS - Wound Dressings X - Small Wound Dressing one or multiple wounds 1 10 []  - 0 Medium Wound Dressing one or multiple wounds []  - 0 Large Wound Dressing one or multiple wounds []  - 0 Application of Medications - topical []  - 0 Application of Medications - injection INTERVENTIONS - Miscellaneous []  - 0 External ear exam []  - 0 Specimen Collection (cultures, biopsies, blood, body fluids, etc.) []  - 0 Specimen(s) / Culture(s) sent or taken to Lab for analysis []  - 0 Patient Transfer (multiple staff / / Similar devices) []  - 0 Simple Staple / Suture removal (25 or less) []  - 0 Complex Staple / Suture removal (26 or more) []  - 0 Hypo / Hyperglycemic Management (close monitor of Blood Glucose) Vicki [name], Vicki [name] ( ) [id number] Page 3 of 8 []  - 0 Ankle / Brachial Index (ABI) - do not check if billed separately X- 1 5 Vital Signs Has the patient been seen at the hospital within the last three years: Yes Total Score: 100 Level Of Care: New/Established - Level 3 Electronic Signature(s) Signed: 02/27/2022 3:41:35 PM By: RN, BSN Entered By: on 02/27/2022 12:47:38 -------------------------------------------------------------------------------- Encounter Discharge Information Details Patient Name: Date of Service: [name]. 02/27/2022 11:15 A M Medical Record Number:  Patient Account Number: Nurse, adult Date of Birth/Sex: Treating RN: 02-22-1924 (88 y.o. Primary Care Beda Dula: Juanell Fairly Other Clinician: Referring Kaymen Adrian: Treating Brandan Glauber/Extender: [id number] in Treatment: 18 Encounter Discharge Information Items Discharge Condition: Stable Ambulatory Status: Walker Discharge Destination: Home Transportation: Private Auto Accompanied By: daughter Schedule Follow-up Appointment: Yes Clinical Summary of Care: Electronic Signature(s) Signed: 02/27/2022 3:41:35 PM By: 03/01/2022 RN, BSN Entered By: Shawn Stall on 02/27/2022 12:48:08 -------------------------------------------------------------------------------- Lower Extremity Assessment Details Patient Name: Date of Service: 03/01/2022 [name]. 02/27/2022 11:15 A M Medical Record Number: 03/01/2022 Patient Account Number: [id number] Date of Birth/Sex: Treating RN: 12-27-1923 (88 y.o. F) Primary Care Javohn Basey: 94 Other Clinician: Referring Toshika Parrow: Treating Lucky Alverson/Extender: [name] in Treatment: 18 Edema Assessment Assessed: [Left: No] [Right: No] Edema: [Left: N] [Right: o] Calf Left: Right: Point of Measurement: 28 cm From Medial Instep 34.2 cm Ankle Left: Right: Point of Measurement: 12 cm From Medial Instep 21 cm Electronic Signature(s) Signed: 02/27/2022 1:42:35 PM By: Elicia Lamp, 19 (03/01/2022) PM By: Shawn Stall [phone].pdf Page 4 of 8 Signed: 02/27/2022 1:42:35 Entered By: Vicki Presume on 02/27/2022 11:26:06 -------------------------------------------------------------------------------- Multi Wound Chart Details Patient Name: Date of Service: Vicki [name], Vicki [name] 02/27/2022 11:15 A M Medical Record Number: 01/28/1924 Patient Account Number: [id number] Date of Birth/Sex: Treating RN: February 09, 1924 (88 y.o.  F) Primary Care Syrai Gladwin: 03/01/2022 Other Clinician: Referring Myrakle Wingler: Treating Kesleigh Morson/Extender: Fredda Hammed in Treatment: 18 Vital Signs Height(in): 62 Pulse(bpm): 52 Weight(lbs): 142 Blood Pressure(mmHg): 165/63 Body Mass Index(BMI): 26 Temperature(F): 98.1 Respiratory Rate(breaths/min): 17 [1:Photos:] [N/A:N/A] Right, Lateral Lower Leg N/A N/A Wound Location: Trauma N/A N/A Wounding Event: Abrasion N/A N/A Primary Etiology: Lymphedema N/A N/A Secondary Etiology: Congestive Heart Failure, N/A N/A Comorbid History: Hypertension, Osteoarthritis, Neuropathy 08/04/2021 N/A N/A Date Acquired:  18 N/A N/A Weeks of Treatment: Healed - Epithelialized N/A N/A Wound Status: No N/A N/A Wound Recurrence: 0x0x0 N/A N/A Measurements L x W x D (cm) 0 N/A N/A A (cm) : rea 0 N/A N/A Volume (cm) : 100.00% N/A N/A % Reduction in Area: 100.00% N/A N/A % Reduction in Volume: Full Thickness Without Exposed N/A N/A Classification: Support Structures Medium N/A N/A Exudate Amount: Serosanguineous N/A N/A Exudate Type: red, brown N/A N/A Exudate Color: Distinct, outline attached N/A N/A Wound Margin: None Present (0%) N/A N/A Granulation Amount: Large (67-100%) N/A N/A Necrotic Amount: Fat Layer (Subcutaneous Tissue): Yes N/A N/A Exposed Structures: Fascia: No Tendon: No Muscle: No Joint: No Bone: No Large (67-100%) N/A N/A Epithelialization: Scarring: Yes N/A N/A Periwound Skin Texture: Excoriation: No Induration: No Callus: No Crepitus: No Rash: No Maceration: No N/A N/A Periwound Skin Moisture: Dry/Scaly: No Hemosiderin Staining: Yes N/A N/A Periwound Skin Color: Atrophie Blanche: No Cyanosis: No Ecchymosis: No Vicki [name], Vicki [name] ([id number]) [id number] Page 5 of 8 Erythema: No Mottled: No Pallor: No Rubor: No Treatment Notes Electronic Signature(s) Signed: 02/27/2022 2:07:33  PM By: Geralyn Corwin DO Entered By: Geralyn Corwin on 02/27/2022 13:08:24 -------------------------------------------------------------------------------- Multi-Disciplinary Care Plan Details Patient Name: Date of Service: Vicki Presume [name]. 02/27/2022 11:15 A M Medical Record Number: [id number] Patient Account Number: [id number] Date of Birth/Sex: Treating RN: 05-Nov-1923 (88 y.o. [name] Primary Care Tahje Borawski: Pricilla Holm Other Clinician: Referring Michell Kader: Treating Teniyah Seivert/Extender: Elicia Lamp in Treatment: 104 Active Inactive Electronic Signature(s) Signed: 02/27/2022 3:41:35 PM By: Shawn Stall RN, BSN Entered By: Shawn Stall on 02/27/2022 12:46:53 -------------------------------------------------------------------------------- Pain Assessment Details Patient Name: Date of Service: Vicki Presume [name]. 02/27/2022 11:15 A M Medical Record Number: [id number] Patient Account Number: [id number] Date of Birth/Sex: Treating RN: 03-01-1924 (88 y.o. F) Primary Care Elon Eoff: Pricilla Holm Other Clinician: Referring Bird Tailor: Treating Jacquelynne Guedes/Extender: Elicia Lamp in Treatment: 18 Active Problems Location of Pain Severity and Description of Pain Patient Has Paino No Site Locations [place], [place] [name] ([id number]) [id number] Page 6 of 8 Pain Management and Medication Current Pain Management: Electronic Signature(s) Signed: 02/27/2022 1:42:35 PM By: Thayer Dallas Entered By: Thayer Dallas on 02/27/2022 11:25:47 -------------------------------------------------------------------------------- Patient/Caregiver Education Details Patient Name: Date of Service: Vicki [name] 12/29/2023andnbsp11:15 A M Medical Record Number: [id number] Patient Account Number: [id number] Date of Birth/Gender: Treating RN: Mar 01, 1924 (88 y.o. [name] Primary Care  Physician: Pricilla Holm Other Clinician: Referring Physician: Treating Physician/Extender: Elicia Lamp in Treatment: 18 Education Assessment Education Provided To: Patient Education Topics Provided Wound/Skin Impairment: Handouts: Caring for Your Ulcer Methods: Explain/Verbal Responses: Reinforcements needed Electronic Signature(s) Signed: 02/27/2022 3:41:35 PM By: Shawn Stall RN, BSN Entered By: Shawn Stall on 02/27/2022 11:12:07 -------------------------------------------------------------------------------- Wound Assessment Details Patient Name: Date of Service: Vicki Presume [name]. 02/27/2022 11:15 A M Medical Record Number: [id number] Patient Account Number: [id number] Date of Birth/Sex: Treating RN: 09-Dec-1923 (88 y.o. [name] Primary Care Yena Tisby: Pricilla Holm Other Clinician: Ander Purpura ([id number]) [id number] Page 7 of 8 Referring Pachia Strum: Treating Doyne Micke/Extender: Elicia Lamp in Treatment: 18 Wound Status Wound Number: 1 Primary Etiology: Abrasion Wound Location: Right, Lateral Lower Leg Secondary Lymphedema Etiology: Wounding Event: Trauma Wound Status: Healed - Epithelialized Date Acquired: 08/04/2021 Comorbid Congestive Heart Failure, Hypertension, Osteoarthritis, Weeks Of Treatment: 18 History: Neuropathy Clustered Wound: No Photos Wound Measurements Length: (cm) Width: (cm) Depth: (cm) Area: (cm) Volume: (cm) 0 % Reduction  in Area: 100% 0 % Reduction in Volume: 100% 0 Epithelialization: Large (67-100%) 0 Tunneling: No 0 Undermining: No Wound Description Classification: Full Thickness Without Exposed Support Structures Wound Margin: Distinct, outline attached Exudate Amount: Medium Exudate Type: Serosanguineous Exudate Color: red, brown Foul Odor After Cleansing: No Slough/Fibrino No Wound Bed Granulation Amount: None  Present (0%) Exposed Structure Necrotic Amount: Large (67-100%) Fascia Exposed: No Fat Layer (Subcutaneous Tissue) Exposed: Yes Tendon Exposed: No Muscle Exposed: No Joint Exposed: No Bone Exposed: No Periwound Skin Texture Texture Color No Abnormalities Noted: No No Abnormalities Noted: No Callus: No Atrophie Blanche: No Crepitus: No Cyanosis: No Excoriation: No Ecchymosis: No Induration: No Erythema: No Rash: No Hemosiderin Staining: Yes Scarring: Yes Mottled: No Pallor: No Moisture Rubor: No No Abnormalities Noted: No Dry / Scaly: No Maceration: No Electronic Signature(s) Signed: 02/27/2022 3:41:35 PM By: Shawn Stalleaton, Bobbi RN, BSN Entered By: Shawn Stalleaton, Bobbi on 02/27/2022 12:46:40 Ander PurpuraALDERMAN, Addasyn [name] ([id number]) [id number] Page 8 of 8 -------------------------------------------------------------------------------- Vitals Details Patient Name: Date of Service: Vicki [name] LDERMA N, Mishael [name]. 02/27/2022 11:15 A M Medical Record Number: [id number] Patient Account Number: [id number] Date of Birth/Sex: Treating RN: 28-Apr-1923 (88 y.o. F) Primary Care Briauna Gilmartin: Pricilla Holmyter-Brown, Sherry Other Clinician: Referring Jaquala Fuller: Treating Harbor Paster/Extender: Elicia LampHoffman, Jessica Ryter-Brown, Sherry Weeks in Treatment: 18 Vital Signs Time Taken: 11:14 Temperature (F): 98.1 Height (in): 62 Pulse (bpm): 52 Weight (lbs): 142 Respiratory Rate (breaths/min): 17 Body Mass Index (BMI): 26 Blood Pressure (mmHg): 165/63 Reference Range: 80 - 120 mg / dl Electronic Signature(s) Signed: 02/27/2022 1:42:35 PM By: Thayer Dallasick, Kimberly Entered By: Thayer Dallasick, Kimberly on 02/27/2022 11:25:39

## 2022-02-27 NOTE — Progress Notes (Signed)
Vicki [name], Vicki [name] ([id number]) [id number] Page 1 of 8 Visit Report for 02/27/2022 Chief Complaint Document Details Patient Name: Date of Service: Vicki [name]. 02/27/2022 11:15 A M Medical Record Number: [id number] Patient Account Number: [id number] Date of Birth/Sex: Treating RN: [birth year] (88 y.o. F) Primary Care Provider: Elenor [name] Other Clinician: Referring Provider: Treating Provider/Extender: Vicki [name] in Treatment: 18 Information Obtained from: Patient Chief Complaint 10/20/2021; right lower extremity wound status post trauma Electronic Signature(s) Signed: 02/27/2022 2:07:33 PM By: Vicki Shan DO Entered By: Vicki [name] on 02/27/2022 13:08:32 -------------------------------------------------------------------------------- HPI Details Patient Name: Date of Service: Vicki Rhymes T. 02/27/2022 11:15 A M Medical Record Number: [id number] Patient Account Number: [id number] Date of Birth/Sex: Treating RN: 03-13-1923 (88 y.o. F) Primary Care Provider: Elenor [name] Other Clinician: Referring Provider: Treating Provider/Extender: Vicki [name] in Treatment: 18 History of Present Illness HPI Description: Admission 10/20/2021 Vicki [name] is a 88 year old female with a past medical history of hypothyroidism and essential hypertension that presents to the clinic for a 75-month history of ulcer to the right lower extremity. On 08/15/2021 the patient experienced a mechanical fall and hit her right leg against her table. She developed a hematoma and she was evaluated in the ED for this issue. The hematoma was evacuated in the ED. She had x-rays of the tibia/fibula and ankle that showed no acute osseous abnormalities. She has been using silver alginate to the wound bed. She did have 2 rounds of doxycycline for this issue and recently completed her second course.  She currently denies signs of infection. 8/28; patient presents for follow-up. She has no issues or complaints today. We have been doing Santyl and Hydrofera Blue under Kerlix/Coban. She has home health that they start coming out once weekly. She denies signs of infection. 9/5; patient presents for follow-up. We have been doing Hydrofera Blue with antibiotic ointment under Kerlix/Coban. She has no issues or complaints today. 9/11; patient presents for follow-up. We have been doing Hydrofera Blue under Kerlix/Coban. Home health has been changing the dressing weekly. The wrap was too tight at the more proximal and cutting into the patient's skin. Other than that she has no issues or complaints today. 9/18; patient presents for follow-up. We have been using PolyMem silver under Kerlix/Coban for the past week. She has no issues or complaints today. 9/25; patient presents for follow-up. We have been using PolyMem silver under Kerlix/Coban. She has no issues or complaints today. 10/2; patient presents for follow-up. We have been using PolyMem silver with antibiotic ointment under compression therapy. Patient has no issues or complaints today. 10/9; patient presents for follow-up. We have been using PolyMem silver under compression therapy. Patient has no issues or complaints today. She reports improvement in wound healing. 10/16; patient presents for follow-up. We have been using PolyMem silver with antibiotic ointment under compression therapy. Patient has no issues or complaints today. 10/23; patient presents for follow-up. We have been using PolyMem silver with antibiotic ointment under Kerlix/Coban. She has no issues or complaints today. 10/30; patient presents for follow-up. We have been using PolyMem silver with antibiotic ointment under Kerlix/Coban. Her wound continues to show signs of Vicki [name] ([id number]) [id number] Page 2 of 8 healing. 11/13; patient  presents for follow-up. We have been using PolyMem silver with antibiotic ointment under Kerlix/Coban. She has more hyper granulated tissue and nonviable tissue today. She denies signs of infection. 11/20; patient presents for follow-up. We have been using Hydrofera  Blue with antibiotic ointment under compression therapy. Patient Vicki [name] she is tolerating this well. She denies signs of infection. 11/27; patient presents for follow-up. We have been using Hydrofera Blue with antibiotic ointment under compression therapy. She has made significant improvement in wound healing over the past week. She has no issues or complaints today. 12/4; patient presents for follow-up. We have been using Hydrofera Blue with antibiotic under compression therapy. She continues to improve and her wound healing. She has no issues or complaints today. 12/11; patient presents for follow-up. We have been using Hydrofera Blue with antibiotic ointment under compression therapy. Her wound is smaller today. She has no issues or complaints. 12/18; patient presents for follow-up. We have been using collagen under Kerlix/Coban. She has no issues or complaints today. There is been improvement in wound healing. 12/29; patient presents for follow-up. We have been using collagen under Kerlix/Coban. The wound has healed. When removing the dressing today part of the dressing was stuck creating a small area of skin breakdown. Electronic Signature(s) Signed: 02/27/2022 2:07:33 PM By: Vicki Shan DO Entered By: Vicki [name] on 02/27/2022 13:10:02 -------------------------------------------------------------------------------- Physical Exam Details Patient Name: Date of Service: Vicki Rhymes T. 02/27/2022 11:15 A M Medical Record Number: [id number] Patient Account Number: [id number] Date of Birth/Sex: Treating RN: 1923-06-01 (88 y.o. F) Primary Care Provider: Elenor [name] Other Clinician: Referring  Provider: Treating Provider/Extender: Vicki [name] in Treatment: 18 Constitutional respirations regular, non-labored and within target range for patient.. Cardiovascular 2+ dorsalis pedis/posterior tibialis pulses. Psychiatric pleasant and cooperative. Notes Right lower extremity: T the anterior aspect there is epithelization to the previous wound site. Small area of skin breakdown adjacent to this. Good edema o control. Venous stasis dermatitis. Electronic Signature(s) Signed: 02/27/2022 2:07:33 PM By: Vicki Shan DO Entered By: Vicki [name] on 02/27/2022 13:10:39 -------------------------------------------------------------------------------- Physician Orders Details Patient Name: Date of Service: Vicki Rhymes T. 02/27/2022 11:15 A M Medical Record Number: [id number] Patient Account Number: [id number] Date of Birth/Sex: Treating RN: Apr 03, 1923 (88 y.o. Vicki [name], Vicki [name] Primary Care Provider: Elenor [name] Other Clinician: Referring Provider: Treating Provider/Extender: Vicki [name] in Treatment: [address], [place] T ([id number]) [id number] Page 3 of 8 Verbal / Phone Orders: No Diagnosis Coding ICD-10 Coding Code Description [id number] Non-pressure chronic ulcer of other part of right lower leg with fat layer exposed T79.8XXA Other early complications of trauma, initial encounter E03.9 Hypothyroidism, unspecified I87.311 Chronic venous hypertension (idiopathic) with ulcer of right lower extremity Follow-up Appointments ppointment in 2 weeks. - Dr. Heber [place] Return A Other: - Apply xeroform and bordered foam over the closed area. Anesthetic (In clinic) Topical Lidocaine 4% applied to wound bed Bathing/ Shower/ Hygiene May shower and wash wound with soap and water. Edema Control - Lymphedema / SCD / Other Elevate legs to the level of the heart or above for 30 minutes daily  and/or when sitting for 3-4 times a day throughout the day. Avoid standing for long periods of time. Exercise regularly Moisturize legs daily. - every night before bed. Other Edema Control Orders/Instructions: - once you are home may use the compression stocking in place of tubigrip. Electronic Signature(s) Signed: 02/27/2022 2:07:33 PM By: Vicki Shan DO Entered By: Vicki [name] on 02/27/2022 13:10:45 -------------------------------------------------------------------------------- Problem List Details Patient Name: Date of Service: Vicki Rhymes T. 02/27/2022 11:15 A M Medical Record Number: [id number] Patient Account Number: [id number] Date of Birth/Sex: Treating RN: 02-02-1924 (88 y.o. [name] Primary Care Provider: Elenor [name] Other  Clinician: Referring Provider: Treating Provider/Extender: Vicki [name] in Treatment: 18 Active Problems ICD-10 Encounter Code Description Active Date MDM Diagnosis L97.812 Non-pressure chronic ulcer of other part of right lower leg with fat layer 10/20/2021 No Yes exposed T79.8XXA Other early complications of trauma, initial encounter 10/20/2021 No Yes E03.9 Hypothyroidism, unspecified 10/20/2021 No Yes I87.311 Chronic venous hypertension (idiopathic) with ulcer of right lower extremity 10/20/2021 No Yes Inactive Problems CHEISEA, MURRAH ([id number]) [phone].pdf Page 4 of 8 Resolved Problems Electronic Signature(s) Signed: 02/27/2022 2:07:33 PM By: Vicki Shan DO Entered By: Vicki [name] on 02/27/2022 13:08:19 -------------------------------------------------------------------------------- Progress Note Details Patient Name: Date of Service: Vicki Rhymes T. 02/27/2022 11:15 A M Medical Record Number: [id number] Patient Account Number: [id number] Date of Birth/Sex: Treating RN: [birth year] (88 y.o. F) Primary Care Provider: Elenor [name] Other  Clinician: Referring Provider: Treating Provider/Extender: Vicki [name] in Treatment: 18 Subjective Chief Complaint Information obtained from Patient 10/20/2021; right lower extremity wound status post trauma History of Present Illness (HPI) Admission 10/20/2021 Ms. Teirra Behnken is a 88 year old female with a past medical history of hypothyroidism and essential hypertension that presents to the clinic for a 60-month history of ulcer to the right lower extremity. On 08/15/2021 the patient experienced a mechanical fall and hit her right leg against her table. She developed a hematoma and she was evaluated in the ED for this issue. The hematoma was evacuated in the ED. She had x-rays of the tibia/fibula and ankle that showed no acute osseous abnormalities. She has been using silver alginate to the wound bed. She did have 2 rounds of doxycycline for this issue and recently completed her second course. She currently denies signs of infection. 8/28; patient presents for follow-up. She has no issues or complaints today. We have been doing Santyl and Hydrofera Blue under Kerlix/Coban. She has home health that they start coming out once weekly. She denies signs of infection. 9/5; patient presents for follow-up. We have been doing Hydrofera Blue with antibiotic ointment under Kerlix/Coban. She has no issues or complaints today. 9/11; patient presents for follow-up. We have been doing Hydrofera Blue under Kerlix/Coban. Home health has been changing the dressing weekly. The wrap was too tight at the more proximal and cutting into the patient's skin. Other than that she has no issues or complaints today. 9/18; patient presents for follow-up. We have been using PolyMem silver under Kerlix/Coban for the past week. She has no issues or complaints today. 9/25; patient presents for follow-up. We have been using PolyMem silver under Kerlix/Coban. She has no issues or complaints  today. 10/2; patient presents for follow-up. We have been using PolyMem silver with antibiotic ointment under compression therapy. Patient has no issues or complaints today. 10/9; patient presents for follow-up. We have been using PolyMem silver under compression therapy. Patient has no issues or complaints today. She reports improvement in wound healing. 10/16; patient presents for follow-up. We have been using PolyMem silver with antibiotic ointment under compression therapy. Patient has no issues or complaints today. 10/23; patient presents for follow-up. We have been using PolyMem silver with antibiotic ointment under Kerlix/Coban. She has no issues or complaints today. 10/30; patient presents for follow-up. We have been using PolyMem silver with antibiotic ointment under Kerlix/Coban. Her wound continues to show signs of healing. 11/13; patient presents for follow-up. We have been using PolyMem silver with antibiotic ointment under Kerlix/Coban. She has more hyper granulated tissue and nonviable tissue today. She denies signs of infection.  11/20; patient presents for follow-up. We have been using Hydrofera Blue with antibiotic ointment under compression therapy. Patient Vicki [name] she is tolerating this well. She denies signs of infection. 11/27; patient presents for follow-up. We have been using Hydrofera Blue with antibiotic ointment under compression therapy. She has made significant improvement in wound healing over the past week. She has no issues or complaints today. 12/4; patient presents for follow-up. We have been using Hydrofera Blue with antibiotic under compression therapy. She continues to improve and her wound healing. She has no issues or complaints today. 12/11; patient presents for follow-up. We have been using Hydrofera Blue with antibiotic ointment under compression therapy. Her wound is smaller today. She has no issues or complaints. 12/18; patient presents for follow-up. We  have been using collagen under Kerlix/Coban. She has no issues or complaints today. There is been improvement in wound healing. 12/29; patient presents for follow-up. We have been using collagen under Kerlix/Coban. The wound has healed. When removing the dressing today part of the dressing was stuck creating a small area of skin breakdown. Vicki [name], Vicki [name] ([id number]) [id number] Page 5 of 8 Patient History Information obtained from Patient. Family History Diabetes - Child, Heart Disease - Child,Mother,Father,Siblings, Hypertension - Father,Mother, No family history of Cancer, Hereditary Spherocytosis, Kidney Disease, Lung Disease, Seizures, Stroke, Thyroid Problems, Tuberculosis. Social History Never smoker, Marital Status - Widowed, Alcohol Use - Never, Drug Use - No History, Caffeine Use - Rarely. Medical History Eyes Denies history of Cataracts, Glaucoma, Optic Neuritis Ear/Nose/Mouth/Throat Denies history of Chronic sinus problems/congestion, Middle ear problems Hematologic/Lymphatic Denies history of Anemia, Hemophilia, Human Immunodeficiency Virus, Lymphedema, Sickle Cell Disease Respiratory Denies history of Aspiration, Asthma, Chronic Obstructive Pulmonary Disease (COPD), Pneumothorax, Sleep Apnea, Tuberculosis Cardiovascular Patient has history of Congestive Heart Failure, Hypertension Denies history of Angina, Arrhythmia, Coronary Artery Disease, Deep Vein Thrombosis, Hypotension, Myocardial Infarction, Peripheral Arterial Disease, Peripheral Venous Disease, Phlebitis, Vasculitis Gastrointestinal Denies history of Cirrhosis , Colitis, Crohnoos, Hepatitis A, Hepatitis B, Hepatitis C Endocrine Denies history of Type I Diabetes, Type II Diabetes Immunological Denies history of Lupus Erythematosus, Raynaudoos, Scleroderma Integumentary (Skin) Denies history of History of Burn Musculoskeletal Patient has history of Osteoarthritis Denies history  of Gout, Rheumatoid Arthritis, Osteomyelitis Neurologic Patient has history of Neuropathy Denies history of Dementia, Quadriplegia, Paraplegia, Seizure Disorder Psychiatric Denies history of Anorexia/bulimia, Confinement Anxiety Hospitalization/Surgery History - back surgery. - right hip surgery. - hysterotomy. Medical A Surgical History Notes nd Ear/Nose/Mouth/Throat hearing aides Genitourinary Stage three Objective Constitutional respirations regular, non-labored and within target range for patient.. Vitals Time Taken: 11:14 AM, Height: 62 in, Weight: 142 lbs, BMI: 26, Temperature: 98.1 F, Pulse: 52 bpm, Respiratory Rate: 17 breaths/min, Blood Pressure: 165/63 mmHg. Cardiovascular 2+ dorsalis pedis/posterior tibialis pulses. Psychiatric pleasant and cooperative. General Notes: Right lower extremity: T the anterior aspect there is epithelization to the previous wound site. Small area of skin breakdown adjacent to this. o Good edema control. Venous stasis dermatitis. Integumentary (Hair, Skin) Wound #1 status is Healed - Epithelialized. Original cause of wound was Trauma. The date acquired was: 08/04/2021. The wound has been in treatment 18 weeks. The wound is located on the Right,Lateral Lower Leg. The wound measures 0cm length x 0cm width x 0cm depth; 0cm^2 area and 0cm^3 volume. There is Fat Layer (Subcutaneous Tissue) exposed. There is no tunneling or undermining noted. There is a medium amount of serosanguineous drainage noted. The wound margin is distinct with the outline attached to the wound base. There is no granulation  within the wound bed. There is a large (67-100%) amount of necrotic tissue within the wound bed. The periwound skin appearance exhibited: Scarring, Hemosiderin Staining. The periwound skin appearance did not exhibit: Callus, Crepitus, Excoriation, Induration, Rash, Dry/Scaly, Maceration, Atrophie Blanche, Cyanosis, Ecchymosis, Mottled, Pallor, Rubor,  Erythema. Assessment Vicki [name], Vicki [name] ([id number]) [id number] Page 6 of 8 Active Problems ICD-10 Non-pressure chronic ulcer of other part of right lower leg with fat layer exposed Other early complications of trauma, initial encounter Hypothyroidism, unspecified Chronic venous hypertension (idiopathic) with ulcer of right lower extremity Patient's original wound has healed. She has done well with collagen under compression therapy. She does have an area of skin breakdown and I offered to do another compression wrap to fully heal this. She declined. At this time I recommended Xeroform to this area and use her own compression stocking. We gave her Tubigrip in office since she did not have her compression stocking with her. Follow-up in 2 weeks or patient can cancel if doing well and wound has healed. Plan Follow-up Appointments: Return Appointment in 2 weeks. - Dr. Mikey Bussing Other: - Apply xeroform and bordered foam over the closed area. Anesthetic: (In clinic) Topical Lidocaine 4% applied to wound bed Bathing/ Shower/ Hygiene: May shower and wash wound with soap and water. Edema Control - Lymphedema / SCD / Other: Elevate legs to the level of the heart or above for 30 minutes daily and/or when sitting for 3-4 times a day throughout the day. Avoid standing for long periods of time. Exercise regularly Moisturize legs daily. - every night before bed. Other Edema Control Orders/Instructions: - once you are home may use the compression stocking in place of tubigrip. 1. Xeroform 2. Compression stockings daily 3. Follow-up in 2 weeks or if area is healed can call and cancel Electronic Signature(s) Signed: 02/27/2022 2:07:33 PM By: Geralyn Corwin DO Entered By: Geralyn Corwin on 02/27/2022 13:13:04 -------------------------------------------------------------------------------- HxROS Details Patient Name: Date of Service: Vicki [name] 02/27/2022 11:15 A  M Medical Record Number: [id number] Patient Account Number: [id number] Date of Birth/Sex: Treating RN: 1923-09-28 (88 y.o. F) Primary Care Provider: Pricilla Holm Other Clinician: Referring Provider: Treating Provider/Extender: Elicia Lamp in Treatment: 18 Information Obtained From Patient Eyes Medical History: Negative for: Cataracts; Glaucoma; Optic Neuritis Ear/Nose/Mouth/Throat Medical History: Negative for: Chronic sinus problems/congestion; Middle ear problems Past Medical History Notes: hearing aides Hematologic/Lymphatic Medical History: LAIYAH, EXLINE ([id number]) [id number] Page 7 of 8 Negative for: Anemia; Hemophilia; Human Immunodeficiency Virus; Lymphedema; Sickle Cell Disease Respiratory Medical History: Negative for: Aspiration; Asthma; Chronic Obstructive Pulmonary Disease (COPD); Pneumothorax; Sleep Apnea; Tuberculosis Cardiovascular Medical History: Positive for: Congestive Heart Failure; Hypertension Negative for: Angina; Arrhythmia; Coronary Artery Disease; Deep Vein Thrombosis; Hypotension; Myocardial Infarction; Peripheral Arterial Disease; Peripheral Venous Disease; Phlebitis; Vasculitis Gastrointestinal Medical History: Negative for: Cirrhosis ; Colitis; Crohns; Hepatitis A; Hepatitis B; Hepatitis C Endocrine Medical History: Negative for: Type I Diabetes; Type II Diabetes Genitourinary Medical History: Past Medical History Notes: Stage three Immunological Medical History: Negative for: Lupus Erythematosus; Raynauds; Scleroderma Integumentary (Skin) Medical History: Negative for: History of Burn Musculoskeletal Medical History: Positive for: Osteoarthritis Negative for: Gout; Rheumatoid Arthritis; Osteomyelitis Neurologic Medical History: Positive for: Neuropathy Negative for: Dementia; Quadriplegia; Paraplegia; Seizure Disorder Psychiatric Medical History: Negative for:  Anorexia/bulimia; Confinement Anxiety Immunizations Pneumococcal Vaccine: Received Pneumococcal Vaccination: Yes Received Pneumococcal Vaccination On or After 60th Birthday: Yes Implantable Devices Yes Hospitalization / Surgery History Type of Hospitalization/Surgery back surgery right hip surgery hysterotomy Family and Social  History Cancer: No; Diabetes: Yes - Child; Heart Disease: Yes - Child,Mother,Father,Siblings; Hereditary Spherocytosis: No; Hypertension: Yes - Father,Mother; Kidney Disease: No; Lung Disease: No; Seizures: No; Stroke: No; Thyroid Problems: No; Tuberculosis: No; Never smoker; Marital Status - Widowed; Alcohol Use: Never; Drug Use: No History; Caffeine Use: Rarely; Financial Concerns: No; Food, Clothing or Shelter Needs: No; Support System Lacking: No; Transportation Concerns: No Vicki [name], Vicki [name] ([id number]) [id number] Page 8 of 8 Electronic Signature(s) Signed: 02/27/2022 2:07:33 PM By: Vicki Shan DO Entered By: Vicki [name] on 02/27/2022 13:10:08 -------------------------------------------------------------------------------- SuperBill Details Patient Name: Date of Service: Vicki Rhymes T. 02/27/2022 Medical Record Number: [id number] Patient Account Number: [id number] Date of Birth/Sex: Treating RN: 02-26-1924 (88 y.o. [name] Primary Care Provider: Elenor [name] Other Clinician: Referring Provider: Treating Provider/Extender: Vicki [name] in Treatment: 18 Diagnosis Coding ICD-10 Codes Code Description [id number] Non-pressure chronic ulcer of other part of right lower leg with fat layer exposed T79.8XXA Other early complications of trauma, initial encounter E03.9 Hypothyroidism, unspecified I87.311 Chronic venous hypertension (idiopathic) with ulcer of right lower extremity Facility Procedures : CPT4 Code: [id number] Description: 99213 - WOUND CARE VISIT-LEV 3 EST  PT Modifier: Quantity: 1 Physician Procedures : CPT4 Code Description Modifier [id number] - WC PHYS LEVEL 3 - EST PT ICD-10 Diagnosis Description [id number] Non-pressure chronic ulcer of other part of right lower leg with fat layer exposed T79.8XXA Other early complications of trauma, initial  encounter E03.9 Hypothyroidism, unspecified I87.311 Chronic venous hypertension (idiopathic) with ulcer of right lower extremity Quantity: 1 Electronic Signature(s) Signed: 02/27/2022 2:07:33 PM By: Vicki Shan DO Entered By: Vicki [name] on 02/27/2022 13:13:18

## 2022-03-05 NOTE — Progress Notes (Signed)
Vicki [name], Vicki [name] ([id number]) [id number] Page 1 of 8 Visit Report for 12/29/2021 Arrival Information Details Patient Name: Date of Service: Vicki [name], Vicki [name] 12/29/2021 12:45 PM Medical Record Number: [id number] Patient Account Number: [id number] Date of Birth/Sex: Treating RN: 13-Apr-1923 (89 y.o. Vicki [name], Meta.Reding Primary Care Briseida Gittings: Elenor Quinones Other Clinician: Referring Maruice Pieroni: Treating Athony Coppa/Extender: [place] Poli in Treatment: 10 Visit Information History Since Last Visit Added or deleted any medications: No Patient Arrived: Walker Any new allergies or adverse reactions: No Arrival Time: 12:55 Had a fall or experienced change in No Accompanied By: daughter in law activities of daily living that may affect Transfer Assistance: None risk of falls: Patient Identification Verified: Yes Signs or symptoms of abuse/neglect since last visito No Secondary Verification Process Completed: Yes Hospitalized since last visit: No Patient Requires Transmission-Based Precautions: No Implantable device outside of the clinic excluding No Patient Has Alerts: No cellular tissue based products placed in the center since last visit: Has Dressing in Place as Prescribed: Yes Has Compression in Place as Prescribed: Yes Pain Present Now: No Electronic Signature(s) Signed: 12/29/2021 4:53:44 PM By: Deon Pilling RN, BSN Entered By: Deon Pilling on 12/29/2021 13:05:15 -------------------------------------------------------------------------------- Encounter Discharge Information Details Patient Name: Date of Service: Vicki [name]. 12/29/2021 12:45 PM Medical Record Number: [id number] Patient Account Number: [id number] Date of Birth/Sex: Treating RN: [birth year] (89 y.o. [name], [name] Primary Care Maxxwell Edgett: Elenor Quinones Other Clinician: Referring Cambryn Charters: Treating Leonel Mccollum/Extender: [place] Poli in Treatment: 10 Encounter Discharge Information Items Discharge Condition: Stable Ambulatory Status: Ambulatory Discharge Destination: Home Transportation: Private Auto Accompanied By: daughter in law Schedule Follow-up Appointment: Yes Clinical Summary of Care: Patient Declined Electronic Signature(s) Signed: 03/04/2022 5:36:43 PM By: Rhae Hammock RN Entered By: Rhae Hammock on 12/29/2021 13:28:17 Vicki [name] ([id number]) [id number] Page 2 of 8 -------------------------------------------------------------------------------- Lower Extremity Assessment Details Patient Name: Date of Service: Vicki, [name] 12/29/2021 12:45 PM Medical Record Number: [id number] Patient Account Number: [id number] Date of Birth/Sex: Treating RN: 11/13/1923 (89 y.o. Vicki [name], [name] Primary Care Lana Flaim: Elenor Quinones Other Clinician: Referring Philamena Kramar: Treating Benna Arno/Extender: [place] Poli in Treatment: 10 Edema Assessment Assessed: Shirlyn Goltz: No] Patrice Paradise: Yes] Edema: [Left: N] [Right: o] Calf Left: Right: Point of Measurement: 28 cm From Medial Instep 32.5 cm Ankle Left: Right: Point of Measurement: 12 cm From Medial Instep 21 cm Vascular Assessment Pulses: Dorsalis Pedis Palpable: [Right:Yes] Electronic Signature(s) Signed: 12/29/2021 4:53:44 PM By: Deon Pilling RN, BSN Entered By: Deon Pilling on 12/29/2021 13:05:57 -------------------------------------------------------------------------------- Multi Wound Chart Details Patient Name: Date of Service: Vicki [name]. 12/29/2021 12:45 PM Medical Record Number: [id number] Patient Account Number: [id number] Date of Birth/Sex: Treating RN: October 15, 1923 (89 y.o. F) Primary Care Silverio Hagan: Elenor Quinones Other Clinician: Referring Dereka Lueras: Treating Laquincy Eastridge/Extender: [place] Poli in  Treatment: 10 Vital Signs Height(in): 62 Pulse(bpm): 44 Weight(lbs): 142 Blood Pressure(mmHg): 149/76 Body Mass Index(BMI): 26 Temperature(F): 97.7 Respiratory Rate(breaths/min): 20 [1:Photos:] [N/A:N/A] Right, Lateral Lower Leg N/A N/A Wound Location: Trauma N/A N/A Wounding Event: Abrasion N/A N/A Primary Etiology: Lymphedema N/A N/A Secondary Etiology: Congestive Heart Failure, N/A N/A Comorbid History: Hypertension, Osteoarthritis, Neuropathy 08/04/2021 N/A N/A Date Acquired: 10 N/A N/A Weeks of Treatment: Open N/A N/A Wound Status: No N/A N/A Wound Recurrence: 8x2x0.1 N/A N/A Measurements L x W x D (cm) 12.566 N/A N/A A (cm) : rea 1.257 N/A N/A Volume (cm) : 76.50% N/A N/A % Reduction in Area: 95.30%  N/A N/A % Reduction in Volume: Full Thickness Without Exposed N/A N/A Classification: Support Structures Medium N/A N/A Exudate Amount: Serosanguineous N/A N/A Exudate Type: red, brown N/A N/A Exudate Color: Distinct, outline attached N/A N/A Wound Margin: Large (67-100%) N/A N/A Granulation Amount: Red, Pink, Hyper-granulation N/A N/A Granulation Quality: Small (1-33%) N/A N/A Necrotic Amount: Fat Layer (Subcutaneous Tissue): Yes N/A N/A Exposed Structures: Fascia: No Tendon: No Muscle: No Joint: No Bone: No Large (67-100%) N/A N/A Epithelialization: Excoriation: No N/A N/A Periwound Skin Texture: Induration: No Callus: No Crepitus: No Rash: No Scarring: No Maceration: No N/A N/A Periwound Skin Moisture: Dry/Scaly: No Hemosiderin Staining: Yes N/A N/A Periwound Skin Color: Atrophie Blanche: No Cyanosis: No Ecchymosis: No Erythema: No Mottled: No Pallor: No Rubor: No No Abnormality N/A N/A Temperature: Chemical Cauterization N/A N/A Procedures Performed: Treatment Notes Wound #1 (Lower Leg) Wound Laterality: Right, Lateral Cleanser Soap and Water Discharge Instruction: May shower and wash wound with dial antibacterial  soap and water prior to dressing change. Wound Cleanser Discharge Instruction: Cleanse the wound with wound cleanser prior to applying a clean dressing using gauze sponges, not tissue or cotton balls. Peri-Wound Care Zinc Oxide Ointment 30g tube Discharge Instruction: Apply Zinc Oxide to periwound with each dressing change Sween Lotion (Moisturizing lotion) Discharge Instruction: Apply moisturizing lotion as directed Topical Gentamicin Discharge Instruction: add directly to wound bed. Primary Dressing PolyMem Silver Non-Adhesive Dressing, 4.25x4.25 in Discharge Instruction: Apply to wound bed as instructed Secondary Dressing Zetuvit Plus 4x8 in Discharge Instruction: Apply over primary dressing as directed. Secured With [name] ([id number]) [id number] Page 4 of 8 Compression Wrap Kerlix Roll 4.5x3.1 (in/yd) Discharge Instruction: Apply Kerlix and Coban compression as directed. Coban Self-Adherent Wrap 4x5 (in/yd) Discharge Instruction: Apply over Kerlix as directed. Compression Stockings Add-Ons Electronic Signature(s) Signed: 12/29/2021 2:03:41 PM By: Kalman Shan DO Entered By: Kalman Shan on 12/29/2021 13:53:30 -------------------------------------------------------------------------------- Multi-Disciplinary Care Plan Details Patient Name: Date of Service: Vicki [name]. 12/29/2021 12:45 PM Medical Record Number: [id number] Patient Account Number: [id number] Date of Birth/Sex: Treating RN: 1923-10-06 (89 y.o. [name], [name] Primary Care Averly Ericson: Elenor Quinones Other Clinician: Referring Caliya Narine: Treating Chanel Mckesson/Extender: [place] Poli in Treatment: 10 Active Inactive Nutrition Nursing Diagnoses: Potential for alteratiion in Nutrition/Potential for imbalanced nutrition Goals: Patient/caregiver agrees to and verbalizes understanding of need to obtain nutritional  consultation Date Initiated: 10/20/2021 Target Resolution Date: 01/31/2022 Goal Status: Active Interventions: Provide education on nutrition Treatment Activities: Education provided on Nutrition : 12/29/2021 Patient referred to Primary Care Physician for further nutritional evaluation : 10/20/2021 Notes: Pain, Acute or Chronic Nursing Diagnoses: Pain, acute or chronic: actual or potential Potential alteration in comfort, pain Goals: Patient will verbalize adequate pain control and receive pain control interventions during procedures as needed Date Initiated: 10/20/2021 Target Resolution Date: 01/31/2022 Goal Status: Active Patient/caregiver will verbalize comfort level met Date Initiated: 10/20/2021 Date Inactivated: 11/04/2021 Target Resolution Date: 11/06/2021 Goal Status: Met Interventions: Assess comfort goal upon admission Provide education on pain management Treatment Activities: Administer pain control measures as ordered : 10/20/2021 Vicki [name] ([id number]) [id number] Page 5 of 8 Notes: Wound/Skin Impairment Nursing Diagnoses: Knowledge deficit related to ulceration/compromised skin integrity Goals: Patient/caregiver will verbalize understanding of skin care regimen Date Initiated: 10/20/2021 Target Resolution Date: 01/31/2022 Goal Status: Active Interventions: Assess patient/caregiver ability to perform ulcer/skin care regimen upon admission and as needed Assess ulceration(s) every visit Provide education on ulcer and skin care Treatment Activities: Skin care regimen initiated : 10/20/2021 Topical  wound management initiated : 10/20/2021 Notes: Electronic Signature(s) Signed: 03/04/2022 5:36:43 PM By: Rhae Hammock RN Entered By: Rhae Hammock on 12/29/2021 13:26:01 -------------------------------------------------------------------------------- Pain Assessment Details Patient Name: Date of Service: Vicki [name] 12/29/2021  12:45 PM Medical Record Number: [id number] Patient Account Number: [id number] Date of Birth/Sex: Treating RN: [birth year] (89 y.o. [name] Primary Care Zarya Lasseigne: Elenor Quinones Other Clinician: Referring Rome Echavarria: Treating Rudolph Dobler/Extender: [place] Poli in Treatment: 10 Active Problems Location of Pain Severity and Description of Pain Patient Has Paino No Site Locations Rate the pain. Current Pain Level: 0 Pain Management and Medication Current Pain Management: Medication: No Cold Application: No Rest: No Massage: No Activity: No LewisE.N.S.: No Heat Application: No Leg drop or elevation: No Is the Current Pain Management Adequate: Adequate Vicki [name], Vicki [name] ([id number]) [id number] Page 6 of 8 How does your wound impact your activities of daily livingo Sleep: No Bathing: No Appetite: No Relationship With Others: No Bladder Continence: No Emotions: No Bowel Continence: No Work: No Toileting: No Drive: No Dressing: No Hobbies: No Engineer, maintenance) Signed: 12/29/2021 4:53:44 PM By: Deon Pilling RN, BSN Entered By: Deon Pilling on 12/29/2021 13:05:45 -------------------------------------------------------------------------------- Patient/Caregiver Education Details Patient Name: Date of Service: Vicki [name] 10/30/2023andnbsp12:45 PM Medical Record Number: [id number] Patient Account Number: [id number] Date of Birth/Gender: Treating RN: Aug 09, 1923 (89 y.o. [name], [name] Primary Care Physician: Elenor Quinones Other Clinician: Referring Physician: Treating Physician/Extender: [place] Poli in Treatment: 10 Education Assessment Education Provided To: Patient Education Topics Provided Nutrition: Methods: Explain/Verbal Responses: Reinforcements needed, State content correctly Electronic Signature(s) Signed: 03/04/2022 5:36:43 PM By: Rhae Hammock RN Entered By: Rhae Hammock on 12/29/2021 13:24:52 -------------------------------------------------------------------------------- Wound Assessment Details Patient Name: Date of Service: Vicki [name]. 12/29/2021 12:45 PM Medical Record Number: [id number] Patient Account Number: [id number] Date of Birth/Sex: Treating RN: May 08, 1923 (89 y.o. Vicki [name], [name] Primary Care Santos Sollenberger: Elenor Quinones Other Clinician: Referring Dietrick Barris: Treating Kermit Arnette/Extender: [place] Poli in Treatment: 10 Wound Status Wound Number: 1 Primary Etiology: Abrasion Wound Location: Right, Lateral Lower Leg Secondary Lymphedema Etiology: Wounding Event: Trauma Wound Status: Open Date Acquired: 08/04/2021 Comorbid Congestive Heart Failure, Hypertension, Osteoarthritis, Weeks Of Treatment: 10 History: Neuropathy Clustered Wound: No Photos Vicki [name], Vicki [name] ([id number]) [id number] Page 7 of 8 Wound Measurements Length: (cm) 8 Width: (cm) 2 Depth: (cm) 0.1 Area: (cm) 12.566 Volume: (cm) 1.257 % Reduction in Area: 76.5% % Reduction in Volume: 95.3% Epithelialization: Large (67-100%) Tunneling: No Undermining: No Wound Description Classification: Full Thickness Without Exposed Suppor Wound Margin: Distinct, outline attached Exudate Amount: Medium Exudate Type: Serosanguineous Exudate Color: red, brown [name] Structures Foul Odor After Cleansing: No Slough/Fibrino Yes Wound Bed Granulation Amount: Large (67-100%) Exposed Structure Granulation Quality: Red, Pink, Hyper-granulation Fascia Exposed: No Necrotic Amount: Small (1-33%) Fat Layer (Subcutaneous Tissue) Exposed: Yes Necrotic Quality: Adherent Slough Tendon Exposed: No Muscle Exposed: No Joint Exposed: No Bone Exposed: No Periwound Skin Texture Texture Color No Abnormalities Noted: No No Abnormalities Noted: No Callus: No Atrophie Blanche: No Crepitus:  No Cyanosis: No Excoriation: No Ecchymosis: No Induration: No Erythema: No Rash: No Hemosiderin Staining: Yes Scarring: No Mottled: No Pallor: No Moisture Rubor: No No Abnormalities Noted: No Dry / Scaly: No Temperature / Pain Maceration: No Temperature: No Abnormality Electronic Signature(s) Signed: 12/29/2021 4:53:44 PM By: Deon Pilling RN, BSN Entered By: Deon Pilling on 12/29/2021 13:06:56 -------------------------------------------------------------------------------- Vitals Details Patient Name: Date of Service: Vicki [name], Vicki [name]. 12/29/2021 12:45  PM Medical Record Number: [id number] Patient Account Number: [id number] Date of Birth/Sex: Treating RN: 20-May-1923 (89 y.o. Vicki [name], [name] Primary Care Modesty Rudy: Elenor Quinones Other Clinician: Referring Lache Dagher: Treating Carel Schnee/Extender: [place] Poli in Treatment: [address] Vicki [name] ([id number]) [id number] Page 8 of 8 Time Taken: 12:55 Temperature (F): 97.7 Height (in): 62 Pulse (bpm): 66 Weight (lbs): 142 Respiratory Rate (breaths/min): 20 Body Mass Index (BMI): 26 Blood Pressure (mmHg): 149/76 Reference Range: 80 - 120 mg / dl Electronic Signature(s) Signed: 12/29/2021 4:53:44 PM By: Deon Pilling RN, BSN Entered By: Deon Pilling on 12/29/2021 13:05:38

## 2022-03-05 NOTE — Progress Notes (Signed)
SALOMA, CADENA ([id number]) [id number] Page 1 of 9 Visit Report for 12/29/2021 Chief Complaint Document Details Patient Name: Date of Service: Vicki [name], Vicki [name] 12/29/2021 12:45 PM Medical Record Number: [id number] Patient Account Number: [id number] Date of Birth/Sex: Treating RN: 09/03/1923 (89 y.o. F) Primary Care Provider: Pricilla [name] Other Clinician: Referring Provider: Treating Provider/Extender: Vicki [name] in Treatment: 10 Information Obtained from: Patient Chief Complaint 10/20/2021; right lower extremity wound status post trauma Electronic Signature(s) Signed: 12/29/2021 2:03:41 PM By: Vicki Corwin DO Entered By: Vicki [name] on 12/29/2021 13:53:38 -------------------------------------------------------------------------------- HPI Details Patient Name: Date of Service: Vicki [name] 12/29/2021 12:45 PM Medical Record Number: [id number] Patient Account Number: [id number] Date of Birth/Sex: Treating RN: 1923-12-21 (89 y.o. F) Primary Care Provider: Pricilla [name] Other Clinician: Referring Provider: Treating Provider/Extender: Vicki [name] in Treatment: 10 History of Present Illness HPI Description: Admission 10/20/2021 [name] is Vicki 89 year old female with Vicki past medical history of hypothyroidism and essential hypertension that presents to the clinic for Vicki 57-month history of ulcer to the right lower extremity. On 08/15/2021 the patient experienced Vicki mechanical fall and hit her right leg against her table. She developed Vicki hematoma and she was evaluated in the ED for this issue. The hematoma was evacuated in the ED. She had x-rays of the tibia/fibula and ankle that showed no acute osseous abnormalities. She has been using silver alginate to the wound bed. She did have 2 rounds of doxycycline for this issue and recently completed her second course. She  currently denies signs of infection. 8/28; patient presents for follow-up. She has no issues or complaints today. We have been doing Santyl and Hydrofera Blue under Kerlix/Coban. She has home health that they start coming out once weekly. She denies signs of infection. 9/5; patient presents for follow-up. We have been doing Hydrofera Blue with antibiotic ointment under Kerlix/Coban. She has no issues or complaints today. 9/11; patient presents for follow-up. We have been doing Hydrofera Blue under Kerlix/Coban. Home health has been changing the dressing weekly. The wrap was too tight at the more proximal and cutting into the patient's skin. Other than that she has no issues or complaints today. 9/18; patient presents for follow-up. We have been using PolyMem silver under Kerlix/Coban for the past week. She has no issues or complaints today. 9/25; patient presents for follow-up. We have been using PolyMem silver under Kerlix/Coban. She has no issues or complaints today. 10/2; patient presents for follow-up. We have been using PolyMem silver with antibiotic ointment under compression therapy. Patient has no issues or complaints today. 10/9; patient presents for follow-up. We have been using PolyMem silver under compression therapy. Patient has no issues or complaints today. She reports improvement in wound healing. 10/16; patient presents for follow-up. We have been using PolyMem silver with antibiotic ointment under compression therapy. Patient has no issues or complaints today. 10/23; patient presents for follow-up. We have been using PolyMem silver with antibiotic ointment under Kerlix/Coban. She has no issues or complaints today. 10/30; patient presents for follow-up. We have been using PolyMem silver with antibiotic ointment under Kerlix/Coban. Her wound continues to show signs of Vicki, [name] ([id number]) [id number] Page 2 of 9 healing. Electronic  Signature(s) Signed: 12/29/2021 2:03:41 PM By: Vicki Corwin DO Entered By: Vicki [name] on 12/29/2021 13:54:07 -------------------------------------------------------------------------------- Chemical Cauterization Details Patient Name: Date of Service: Vicki [name] 12/29/2021 12:45 PM Medical Record Number: [id number] Patient Account Number: [id number] Date  of Birth/Sex: Treating RN: 1923-05-07 (89 y.o. Vicki [name], Vicki [name] Primary Care Provider: Elenor [name] Other Clinician: Referring Provider: Treating Provider/Extender: Vicki [name] in Treatment: 10 Procedure Performed for: Wound #1 Right,Lateral Lower Leg Performed By: Physician Vicki Shan, DO Post Procedure Diagnosis Same as Pre-procedure Notes Dr. Heber [name] used silver nitrate Electronic Signature(s) Signed: 12/29/2021 2:03:41 PM By: Vicki Shan DO Signed: 03/04/2022 5:36:43 PM By: Vicki Hammock RN Entered By: Vicki [name] on 12/29/2021 13:26:28 -------------------------------------------------------------------------------- Physical Exam Details Patient Name: Date of Service: Vicki [name] 12/29/2021 12:45 PM Medical Record Number: [id number] Patient Account Number: [id number] Date of Birth/Sex: Treating RN: October 12, 1923 (89 y.o. F) Primary Care Provider: Elenor [name] Other Clinician: Referring Provider: Treating Provider/Extender: Vicki [name] in Treatment: 10 Constitutional respirations regular, non-labored and within target range for patient.. Cardiovascular 2+ dorsalis pedis/posterior tibialis pulses. Psychiatric pleasant and cooperative. Notes Right lower extremity: T the anterior aspect there is Vicki large open wound with Hyper granulated tissue. No signs of surrounding infection. Venous stasis o dermatitis. Electronic Signature(s) Signed: 12/29/2021 2:03:41 PM By: Vicki Shan DO Entered By:  Vicki [name] on 12/29/2021 13:58:32 [name] ([id number]) [id number] Page 3 of 9 -------------------------------------------------------------------------------- Physician Orders Details Patient Name: Date of Service: Vicki [name], Vicki [name] 12/29/2021 12:45 PM Medical Record Number: [id number] Patient Account Number: [id number] Date of Birth/Sex: Treating RN: May 13, 1923 (89 y.o. Vicki [name], Vicki [name] Primary Care Provider: Elenor [name] Other Clinician: Referring Provider: Treating Provider/Extender: Vicki [name] in Treatment: 10 Verbal / Phone Orders: No Diagnosis Coding Follow-up Appointments ppointment in 2 weeks. - w/ Dr. Heber [place] Return Vicki Nurse Visit: - next Monday Anesthetic (In clinic) Topical Lidocaine 5% applied to wound bed Cellular or Tissue Based Products Wound #1 Right,Lateral Lower Leg Cellular or Tissue Based Product Type: - run insurance authorization for advance tissue products. Bathing/ Shower/ Hygiene May shower with protection but do not get wound dressing(s) wet. Edema Control - Lymphedema / SCD / Other Elevate legs to the level of the heart or above for 30 minutes daily and/or when sitting, Vicki frequency of: - 3-4 times Vicki day throughout the day. Avoid standing for long periods of time. Exercise regularly Wound Treatment Wound #1 - Lower Leg Wound Laterality: Right, Lateral Cleanser: Soap and Water 1 x Per Week/30 Days Discharge Instructions: May shower and wash wound with dial antibacterial soap and water prior to dressing change. Cleanser: Wound Cleanser 1 x Per Week/30 Days Discharge Instructions: Cleanse the wound with wound cleanser prior to applying Vicki clean dressing using gauze sponges, not tissue or cotton balls. Peri-Wound Care: Zinc Oxide Ointment 30g tube 1 x Per Week/30 Days Discharge Instructions: Apply Zinc Oxide to periwound with each dressing change Peri-Wound Care: Sween  Lotion (Moisturizing lotion) 1 x Per Week/30 Days Discharge Instructions: Apply moisturizing lotion as directed Topical: Gentamicin 1 x Per Week/30 Days Discharge Instructions: add directly to wound bed. Prim Dressing: PolyMem Silver Non-Adhesive Dressing, 4.25x4.25 in 1 x Per Week/30 Days ary Discharge Instructions: Apply to wound bed as instructed Secondary Dressing: Zetuvit Plus 4x8 in 1 x Per Week/30 Days Discharge Instructions: Apply over primary dressing as directed. Compression Wrap: Kerlix Roll 4.5x3.1 (in/yd) (Home Health) 1 x Per Week/30 Days Discharge Instructions: Apply Kerlix and Coban compression as directed. Compression Wrap: Coban Self-Adherent Wrap 4x5 (in/yd) (Home Health) 1 x Per Week/30 Days Discharge Instructions: Apply over Kerlix as directed. Electronic Signature(s) Signed: 12/29/2021 2:03:41 PM By: Vicki Shan DO Entered By: Vicki [name]  on 12/29/2021 13:58:43 Vicki [name], Vicki [name] ([id number]) [id number] Page 4 of 9 -------------------------------------------------------------------------------- Problem List Details Patient Name: Date of Service: Vicki [name], Vicki [name] 12/29/2021 12:45 PM Medical Record Number: [id number] Patient Account Number: [id number] Date of Birth/Sex: Treating RN: 1923/12/30 (89 y.o. F) Primary Care Provider: Elenor [name] Other Clinician: Referring Provider: Treating Provider/Extender: Vicki [name] in Treatment: 10 Active Problems ICD-10 Encounter Code Description Active Date MDM Diagnosis L97.812 Non-pressure chronic ulcer of other part of right lower leg with fat layer 10/20/2021 No Yes exposed T79.8XXA Other early complications of trauma, initial encounter 10/20/2021 No Yes E03.9 Hypothyroidism, unspecified 10/20/2021 No Yes I87.311 Chronic venous hypertension (idiopathic) with ulcer of right lower extremity 10/20/2021 No Yes Inactive Problems Resolved  Problems Electronic Signature(s) Signed: 12/29/2021 2:03:41 PM By: Vicki Shan DO Entered By: Vicki [name] on 12/29/2021 13:53:25 -------------------------------------------------------------------------------- Progress Note Details Patient Name: Date of Service: Vicki Rhymes T. 12/29/2021 12:45 PM Medical Record Number: [id number] Patient Account Number: [id number] Date of Birth/Sex: Treating RN: 12/10/1923 (89 y.o. F) Primary Care Provider: Elenor [name] Other Clinician: Referring Provider: Treating Provider/Extender: Vicki [name] in Treatment: 10 Subjective Chief Complaint Information obtained from Patient 10/20/2021; right lower extremity wound status post trauma History of Present Illness (HPI) Admission 10/20/2021 Ms. Richardine Peppers is Vicki 89 year old female with Vicki past medical history of hypothyroidism and essential hypertension that presents to the clinic for Vicki 66-month history of ulcer to the right lower extremity. On 08/15/2021 the patient experienced Vicki mechanical fall and hit her right leg against her table. She developed Vicki hematoma and she was evaluated in the ED for this issue. The hematoma was evacuated in the ED. She had x-rays of the tibia/fibula and ankle that showed SHERLIE, BOYUM T ([id number]) [id number] Page 5 of 9 no acute osseous abnormalities. She has been using silver alginate to the wound bed. She did have 2 rounds of doxycycline for this issue and recently completed her second course. She currently denies signs of infection. 8/28; patient presents for follow-up. She has no issues or complaints today. We have been doing Santyl and Hydrofera Blue under Kerlix/Coban. She has home health that they start coming out once weekly. She denies signs of infection. 9/5; patient presents for follow-up. We have been doing Hydrofera Blue with antibiotic ointment under Kerlix/Coban. She has no issues or  complaints today. 9/11; patient presents for follow-up. We have been doing Hydrofera Blue under Kerlix/Coban. Home health has been changing the dressing weekly. The wrap was too tight at the more proximal and cutting into the patient's skin. Other than that she has no issues or complaints today. 9/18; patient presents for follow-up. We have been using PolyMem silver under Kerlix/Coban for the past week. She has no issues or complaints today. 9/25; patient presents for follow-up. We have been using PolyMem silver under Kerlix/Coban. She has no issues or complaints today. 10/2; patient presents for follow-up. We have been using PolyMem silver with antibiotic ointment under compression therapy. Patient has no issues or complaints today. 10/9; patient presents for follow-up. We have been using PolyMem silver under compression therapy. Patient has no issues or complaints today. She reports improvement in wound healing. 10/16; patient presents for follow-up. We have been using PolyMem silver with antibiotic ointment under compression therapy. Patient has no issues or complaints today. 10/23; patient presents for follow-up. We have been using PolyMem silver with antibiotic ointment under Kerlix/Coban. She has no issues or complaints today. 10/30; patient presents for  follow-up. We have been using PolyMem silver with antibiotic ointment under Kerlix/Coban. Her wound continues to show signs of healing. Patient History Information obtained from Patient. Family History Diabetes - Child, Heart Disease - Child,Mother,Father,Siblings, Hypertension - Father,Mother, No family history of Cancer, Hereditary Spherocytosis, Kidney Disease, Lung Disease, Seizures, Stroke, Thyroid Problems, Tuberculosis. Social History Never smoker, Marital Status - Widowed, Alcohol Use - Never, Drug Use - No History, Caffeine Use - Rarely. Medical History Eyes Denies history of Cataracts, Glaucoma, Optic  Neuritis Ear/Nose/Mouth/Throat Denies history of Chronic sinus problems/congestion, Middle ear problems Hematologic/Lymphatic Denies history of Anemia, Hemophilia, Human Immunodeficiency Virus, Lymphedema, Sickle Cell Disease Respiratory Denies history of Aspiration, Asthma, Chronic Obstructive Pulmonary Disease (COPD), Pneumothorax, Sleep Apnea, Tuberculosis Cardiovascular Patient has history of Congestive Heart Failure, Hypertension Denies history of Angina, Arrhythmia, Coronary Artery Disease, Deep Vein Thrombosis, Hypotension, Myocardial Infarction, Peripheral Arterial Disease, Peripheral Venous Disease, Phlebitis, Vasculitis Gastrointestinal Denies history of Cirrhosis , Colitis, Crohnoos, Hepatitis Vicki, Hepatitis B, Hepatitis C Endocrine Denies history of Type I Diabetes, Type II Diabetes Immunological Denies history of Lupus Erythematosus, Raynaudoos, Scleroderma Integumentary (Skin) Denies history of History of Burn Musculoskeletal Patient has history of Osteoarthritis Denies history of Gout, Rheumatoid Arthritis, Osteomyelitis Neurologic Patient has history of Neuropathy Denies history of Dementia, Quadriplegia, Paraplegia, Seizure Disorder Psychiatric Denies history of Anorexia/bulimia, Confinement Anxiety Hospitalization/Surgery History - back surgery. - right hip surgery. - hysterotomy. Medical Vicki Surgical History Notes nd Ear/Nose/Mouth/Throat hearing aides Genitourinary Stage three Objective Constitutional Vicki [name], Vicki [name] ([id number]) [id number] Page 6 of 9 respirations regular, non-labored and within target range for patient.. Vitals Time Taken: 12:55 PM, Height: 62 in, Weight: 142 lbs, BMI: 26, Temperature: 97.7 F, Pulse: 66 bpm, Respiratory Rate: 20 breaths/min, Blood Pressure: 149/76 mmHg. Cardiovascular 2+ dorsalis pedis/posterior tibialis pulses. Psychiatric pleasant and cooperative. General Notes: Right lower extremity: T  the anterior aspect there is Vicki large open wound with Hyper granulated tissue. No signs of surrounding infection. o Venous stasis dermatitis. Integumentary (Hair, Skin) Wound #1 status is Open. Original cause of wound was Trauma. The date acquired was: 08/04/2021. The wound has been in treatment 10 weeks. The wound is located on the Right,Lateral Lower Leg. The wound measures 8cm length x 2cm width x 0.1cm depth; 12.566cm^2 area and 1.257cm^3 volume. There is Fat Layer (Subcutaneous Tissue) exposed. There is no tunneling or undermining noted. There is Vicki medium amount of serosanguineous drainage noted. The wound margin is distinct with the outline attached to the wound base. There is large (67-100%) red, pink, hyper - granulation within the wound bed. There is Vicki small (1- 33%) amount of necrotic tissue within the wound bed including Adherent Slough. The periwound skin appearance exhibited: Hemosiderin Staining. The periwound skin appearance did not exhibit: Callus, Crepitus, Excoriation, Induration, Rash, Scarring, Dry/Scaly, Maceration, Atrophie Blanche, Cyanosis, Ecchymosis, Mottled, Pallor, Rubor, Erythema. Periwound temperature was noted as No Abnormality. Assessment Active Problems ICD-10 Non-pressure chronic ulcer of other part of right lower leg with fat layer exposed Other early complications of trauma, initial encounter Hypothyroidism, unspecified Chronic venous hypertension (idiopathic) with ulcer of right lower extremity Patient's wound has shown improvement in size and appearance since last clinic visit. I used silver nitrate to the hyper granulated areas. I recommended continuing the course with PolyMem silver and antibiotic ointment under compression therapy. Follow-up in 1 week. Procedures Wound #1 Pre-procedure diagnosis of Wound #1 is an Abrasion located on the Right,Lateral Lower Leg . An Chemical Cauterization procedure was performed by Vicki Corwin, DO. Post procedure  Diagnosis Wound #1: Same as Pre-Procedure Notes: Dr. Mikey Bussing used silver nitrate Plan Follow-up Appointments: Return Appointment in 2 weeks. - w/ Dr. Mikey Bussing Nurse Visit: - next Monday Anesthetic: (In clinic) Topical Lidocaine 5% applied to wound bed Cellular or Tissue Based Products: Wound #1 Right,Lateral Lower Leg: Cellular or Tissue Based Product Type: - run insurance authorization for advance tissue products. Bathing/ Shower/ Hygiene: May shower with protection but do not get wound dressing(s) wet. Edema Control - Lymphedema / SCD / Other: Elevate legs to the level of the heart or above for 30 minutes daily and/or when sitting, Vicki frequency of: - 3-4 times Vicki day throughout the day. Avoid standing for long periods of time. Exercise regularly WOUND #1: - Lower Leg Wound Laterality: Right, Lateral Cleanser: Soap and Water 1 x Per Week/30 Days Discharge Instructions: May shower and wash wound with dial antibacterial soap and water prior to dressing change. Cleanser: Wound Cleanser 1 x Per Week/30 Days Discharge Instructions: Cleanse the wound with wound cleanser prior to applying Vicki clean dressing using gauze sponges, not tissue or cotton balls. Peri-Wound Care: Zinc Oxide Ointment 30g tube 1 x Per Week/30 Days Discharge Instructions: Apply Zinc Oxide to periwound with each dressing change Peri-Wound Care: Sween Lotion (Moisturizing lotion) 1 x Per Week/30 Days Discharge Instructions: Apply moisturizing lotion as directed Topical: Gentamicin 1 x Per Week/30 Days Discharge Instructions: add directly to wound bed. Prim Dressing: PolyMem Silver Non-Adhesive Dressing, 4.25x4.25 in 1 x Per Week/30 Days ary Discharge Instructions: Apply to wound bed as instructed Vicki [name], Vicki [name] ([id number]) [id number] Page 7 of 9 Secondary Dressing: Zetuvit Plus 4x8 in 1 x Per Week/30 Days Discharge Instructions: Apply over primary dressing as directed. Compression Wrap:  Kerlix Roll 4.5x3.1 (in/yd) (Home Health) 1 x Per Week/30 Days Discharge Instructions: Apply Kerlix and Coban compression as directed. Compression Wrap: Coban Self-Adherent Wrap 4x5 (in/yd) (Home Health) 1 x Per Week/30 Days Discharge Instructions: Apply over Kerlix as directed. 1. Silver nitrate 2. PolyMem silver and antibiotic ointment under Kerlix/Coban 3. Follow-up in 1 week for nurse visit and 2 weeks for physician visit Electronic Signature(s) Signed: 12/29/2021 2:03:41 PM By: Vicki Corwin DO Entered By: Vicki [name] on 12/29/2021 13:59:38 -------------------------------------------------------------------------------- HxROS Details Patient Name: Date of Service: Vicki [name] 12/29/2021 12:45 PM Medical Record Number: [id number] Patient Account Number: [id number] Date of Birth/Sex: Treating RN: 1923-08-03 (89 y.o. F) Primary Care Provider: Pricilla [name] Other Clinician: Referring Provider: Treating Provider/Extender: Vicki [name] in Treatment: 10 Information Obtained From Patient Eyes Medical History: Negative for: Cataracts; Glaucoma; Optic Neuritis Ear/Nose/Mouth/Throat Medical History: Negative for: Chronic sinus problems/congestion; Middle ear problems Past Medical History Notes: hearing aides Hematologic/Lymphatic Medical History: Negative for: Anemia; Hemophilia; Human Immunodeficiency Virus; Lymphedema; Sickle Cell Disease Respiratory Medical History: Negative for: Aspiration; Asthma; Chronic Obstructive Pulmonary Disease (COPD); Pneumothorax; Sleep Apnea; Tuberculosis Cardiovascular Medical History: Positive for: Congestive Heart Failure; Hypertension Negative for: Angina; Arrhythmia; Coronary Artery Disease; Deep Vein Thrombosis; Hypotension; Myocardial Infarction; Peripheral Arterial Disease; Peripheral Venous Disease; Phlebitis; Vasculitis Gastrointestinal Medical History: Negative for: Cirrhosis ;  Colitis; Crohns; Hepatitis Vicki; Hepatitis B; Hepatitis C Endocrine Medical History: Negative for: Type I Diabetes; Type II Diabetes Genitourinary Vicki [name], Vicki [name] ([id number]) [id number] Page 8 of 9 Medical History: Past Medical History Notes: Stage three Immunological Medical History: Negative for: Lupus Erythematosus; Raynauds; Scleroderma Integumentary (Skin) Medical History: Negative for: History of Burn Musculoskeletal Medical History: Positive for: Osteoarthritis Negative for: Gout; Rheumatoid Arthritis; Osteomyelitis Neurologic Medical History: Positive for:  Neuropathy Negative for: Dementia; Quadriplegia; Paraplegia; Seizure Disorder Psychiatric Medical History: Negative for: Anorexia/bulimia; Confinement Anxiety Immunizations Pneumococcal Vaccine: Received Pneumococcal Vaccination: Yes Received Pneumococcal Vaccination On or After 60th Birthday: Yes Implantable Devices Yes Hospitalization / Surgery History Type of Hospitalization/Surgery back surgery right hip surgery hysterotomy Family and Social History Cancer: No; Diabetes: Yes - Child; Heart Disease: Yes - Child,Mother,Father,Siblings; Hereditary Spherocytosis: No; Hypertension: Yes - Father,Mother; Kidney Disease: No; Lung Disease: No; Seizures: No; Stroke: No; Thyroid Problems: No; Tuberculosis: No; Never smoker; Marital Status - Widowed; Alcohol Use: Never; Drug Use: No History; Caffeine Use: Rarely; Financial Concerns: No; Food, Clothing or Shelter Needs: No; Support System Lacking: No; Transportation Concerns: No Electronic Signature(s) Signed: 12/29/2021 2:03:41 PM By: Vicki CorwinHoffman, Ashwin Tibbs DO Entered By: Vicki CorwinHoffman, Ridley Schewe on 12/29/2021 13:57:51 -------------------------------------------------------------------------------- SuperBill Details Patient Name: Date of Service: Vicki PresumeA LDERMA [name], Vicki T. 12/29/2021 Medical Record Number: [id number] Patient Account Number: [id number] Date of  Birth/Sex: Treating RN: 12/18/1923 (89 y.o. Ardis RowanF) Breedlove, Vicki [name] Primary Care Provider: Pricilla Holmyter-Brown, Sherry Other Clinician: Referring Provider: Treating Provider/Extender: Vicki LampHoffman, Mckena Chern Ryter-Brown, Sherry Weeks in Treatment: [address] Diagnosis Coding [place], [place] T ([id number]) [id number] Page 9 of 9 ICD-10 Codes Code Description [phone] Non-pressure chronic ulcer of other part of right lower leg with fat layer exposed T79.8XXA Other early complications of trauma, initial encounter E03.9 Hypothyroidism, unspecified I87.311 Chronic venous hypertension (idiopathic) with ulcer of right lower extremity Facility Procedures : CPT4 Code: [id number] Description: 17250 - CHEM CAUT GRANULATION TISS ICD-10 Diagnosis Description L97.812 Non-pressure chronic ulcer of other part of right lower leg with fat layer expo Modifier: sed Quantity: 1 Physician Procedures : CPT4 Code Description Modifier [id number] 17250 - WC PHYS CHEM CAUT GRAN TISSUE ICD-10 Diagnosis Description L97.812 Non-pressure chronic ulcer of other part of right lower leg with fat layer exposed Quantity: 1 Electronic Signature(s) Signed: 12/29/2021 2:03:41 PM By: Vicki CorwinHoffman, Laquanta Hummel DO Entered By: Vicki CorwinHoffman, Donae Kueker on 12/29/2021 13:59:49

## 2022-08-12 ENCOUNTER — Encounter (HOSPITAL_COMMUNITY): Payer: Self-pay

## 2022-08-12 ENCOUNTER — Emergency Department (HOSPITAL_COMMUNITY): Payer: Medicare Other

## 2022-08-12 ENCOUNTER — Emergency Department (HOSPITAL_COMMUNITY)
Admission: EM | Admit: 2022-08-12 | Discharge: 2022-08-13 | Disposition: A | Discharge status: Home / Self Care | Payer: Medicare Other | Attending: Emergency Medicine | Admitting: Emergency Medicine

## 2022-08-12 ENCOUNTER — Other Ambulatory Visit: Payer: Self-pay

## 2022-08-12 DIAGNOSIS — R009 Unspecified abnormalities of heart beat: Secondary | ICD-10-CM | POA: Diagnosis present

## 2022-08-12 DIAGNOSIS — R7309 Other abnormal glucose: Secondary | ICD-10-CM | POA: Diagnosis not present

## 2022-08-12 DIAGNOSIS — E039 Hypothyroidism, unspecified: Secondary | ICD-10-CM | POA: Insufficient documentation

## 2022-08-12 DIAGNOSIS — I498 Other specified cardiac arrhythmias: Secondary | ICD-10-CM

## 2022-08-12 DIAGNOSIS — I509 Heart failure, unspecified: Secondary | ICD-10-CM | POA: Insufficient documentation

## 2022-08-12 DIAGNOSIS — I110 Hypertensive heart disease with heart failure: Secondary | ICD-10-CM | POA: Insufficient documentation

## 2022-08-12 HISTORY — DX: Essential (primary) hypertension: I10

## 2022-08-12 HISTORY — DX: Hypothyroidism, unspecified: E03.9

## 2022-08-12 LAB — PROTIME-INR
INR: 1 (ref 0.8–1.2)
Prothrombin Time: 13.1 s (ref 11.4–15.2)

## 2022-08-12 LAB — COMPREHENSIVE METABOLIC PANEL
ALT: 14 U/L (ref 0–44)
AST: 17 U/L (ref 15–41)
Albumin: 4 g/dL (ref 3.5–5.0)
Alkaline Phosphatase: 59 U/L (ref 38–126)
Anion gap: 13 (ref 5–15)
BUN: 19 mg/dL (ref 8–23)
CO2: 25 mmol/L (ref 22–32)
Calcium: 9.5 mg/dL (ref 8.9–10.3)
Chloride: 100 mmol/L (ref 98–111)
Creatinine, Ser: 1.02 mg/dL — ABNORMAL HIGH (ref 0.44–1.00)
GFR, Estimated: 50 mL/min — ABNORMAL LOW (ref >60)
Glucose, Bld: 112 mg/dL — ABNORMAL HIGH (ref 70–99)
Potassium: 4 mmol/L (ref 3.5–5.1)
Sodium: 138 mmol/L (ref 135–145)
Total Bilirubin: 0.5 mg/dL (ref 0.3–1.2)
Total Protein: 6.6 g/dL (ref 6.5–8.1)

## 2022-08-12 LAB — CBC
HCT: 45.9 % (ref 36.0–46.0)
Hemoglobin: 14.7 g/dL (ref 12.0–15.0)
MCH: 29.9 pg (ref 26.0–34.0)
MCHC: 32 g/dL (ref 30.0–36.0)
MCV: 93.5 fL (ref 80.0–100.0)
Platelets: 234 10*3/uL (ref 150–400)
RBC: 4.91 MIL/uL (ref 3.87–5.11)
RDW: 15 % (ref 11.5–15.5)
WBC: 9 10*3/uL (ref 4.0–10.5)
nRBC: 0 % (ref 0.0–0.2)

## 2022-08-12 LAB — TROPONIN I (HIGH SENSITIVITY)
Troponin I (High Sensitivity): 12 ng/L
Troponin I (High Sensitivity): 13 ng/L

## 2022-08-12 LAB — TSH: TSH: 1.769 u[IU]/mL (ref 0.350–4.500)

## 2022-08-12 LAB — CBG MONITORING, ED: Glucose-Capillary: 119 mg/dL — ABNORMAL HIGH (ref 70–99)

## 2022-08-12 NOTE — ED Notes (Signed)
Pt reported she had to urgently go to the bathroom, patient taken to restroom by her daughter

## 2022-08-12 NOTE — ED Provider Notes (Signed)
Vicki [name] Provider Note   CSN: [id number] Arrival date & time: 08/12/22  1832     History  No chief complaint on file.   Vicki [name] is a 89 y.o. female with a past medical history of heart failure with reduced ejection fraction, anxiety, ventricular ectopy, bradycardia, dependent edema, hypertension, hypothyroidism who presents to the emergency department with abnormal heart rate.  History is given by the patient and her granddaughter.  She states that she was sitting in her recliner today and did not "feel well."  She states that all of a sudden her watch went off and told her her heart rate was 147.  She states that at other times her heart rate was very low.  She was seen in urgent care earlier today and sent here because she reported several times of "just waking up" not realizing that she had gone to sleep with concern that this could be equivalent to syncope.  She has no previous history of syncope.  She denies chest pain or shortness of breath.  She was concerned because her blood pressure was elevated prior to arrival.  .,  HPI     Home Medications Prior to Admission medications   Not on File      Allergies    Patient has no known allergies.    Review of Systems   Review of Systems  Physical Exam Updated Vital Signs BP (!) 143/86 (BP Location: Right Arm)   Pulse 76   Temp 98.5 F (36.9 C) (Oral)   Resp 16   Ht 5\' 2"  (1.575 m)   Wt 66.2 kg   SpO2 97%   BMI 26.70 kg/m  Physical Exam Vitals and nursing note reviewed.  Constitutional:      General: She is not in acute distress.    Appearance: She is well-developed. She is not diaphoretic.  HENT:     Head: Normocephalic and atraumatic.     Right Ear: External ear normal.     Left Ear: External ear normal.     Nose: Nose normal.     Mouth/Throat:     Mouth: Mucous membranes are moist.  Eyes:     General: No scleral icterus.    Conjunctiva/sclera:  Conjunctivae normal.  Cardiovascular:     Rate and Rhythm: Normal rate and regular rhythm.     Heart sounds: Normal heart sounds. No murmur heard.    No friction rub. No gallop.  Pulmonary:     Effort: Pulmonary effort is normal. No respiratory distress.     Breath sounds: Normal breath sounds.  Abdominal:     General: Bowel sounds are normal. There is no distension.     Palpations: Abdomen is soft. There is no mass.     Tenderness: There is no abdominal tenderness. There is no guarding.  Musculoskeletal:     Cervical back: Normal range of motion.  Skin:    General: Skin is warm and dry.  Neurological:     Mental Status: She is alert and oriented to person, place, and time.  Psychiatric:        Behavior: Behavior normal.     ED Results / Procedures / Treatments   Labs (all labs ordered are listed, but only abnormal results are displayed) Labs Reviewed  CBG MONITORING, ED - Abnormal; Notable for the following components:      Result Value   Glucose-Capillary 119 (*)    All other components within normal  limits  CBC  COMPREHENSIVE METABOLIC PANEL  TSH  PROTIME-INR  TROPONIN I (HIGH SENSITIVITY)    EKG EKG Interpretation  Date/Time:  Wednesday August 12 2022 20:07:50 EDT Ventricular Rate:  86 PR Interval:  136 QRS Duration: 139 QT Interval:  429 QTC Calculation: 411 R Axis:   -60 Text Interpretation: Sinus rhythm Ventricular bigeminy Left bundle branch block No significant change since last tracing today Confirmed by Meridee Score [phone]) on 08/12/2022 8:12:53 PM  Radiology DG Chest 2 View  Result Date: 08/12/2022 CLINICAL DATA:  Shortness of breath, syncope EXAM: CHEST - 2 VIEW COMPARISON:  08/01/2004 FINDINGS: Transverse diameter of heart is increased. There are no signs of pulmonary edema. Increased interstitial markings are seen in right lower lung field. There is no focal consolidation. There is no pleural effusion or pneumothorax. IMPRESSION: Increased  interstitial markings are seen in right lower lung fields suggesting atelectasis/pneumonia. There is no pleural effusion. Electronically Signed   By: Ernie Avena M.D.   On: 08/12/2022 19:56    Procedures Procedures    Medications Ordered in ED Medications - No data to display  ED Course/ Medical Decision Making/ A&P Clinical Course as of 08/12/22 2202  Wed Aug 12, 2022  5921 89 year old female with history of PVCs here with possible syncopal event.  She is well-appearing however in frequent PVCs and even in bigeminy at times.  Blood pressure okay with it.  Getting labs.  Disposition per results of testing. [MB]    Clinical Course User Index [MB] Terrilee Files, MD                             Medical Decision Making Amount and/or Complexity of Data Reviewed Labs: ordered. Radiology: ordered. ECG/medicine tests: ordered.   89 year old female with history of bigeminy and bradycardia who presents with abnormal heart rate on her watch.  Questionable syncope versus just falling asleep in her chair. The differential for syncope is extensive and includes, but is not limited to: arrythmia (Vtach, SVT, SSS, sinus arrest, AV block, bradycardia) aortic stenosis, AMI, HOCM, PE, atrial myxoma, pulmonary hypertension, orthostatic hypotension, (hypovolemia, drug effect, GB syndrome, micturition, cough, swall) carotid sinus sensitivity, Seizure, TIA/CVA, hypoglycemia,  Vertigo.  I ordered labs which show no significant abnormalities.  Her thyroid-stimulating hormone is within normal limits.  I ordered an EKG which shows a new left bundle branch block, bigeminy. On the monitor patient is sinus rhythm at a rate of 96 however she is conducting every other beat so true rate is in the 40s.  She is asymptomatic at a rate of between 40 and 50 and has not had any episodes of syncope, chest pain, shortness of breath.  Is a known and longstanding history of the same.  Patient x-ray shows no acute  findings.  I personally visualized and interpreted these results.  In shared decision-making with the patient and her family at bedside and after 2 negative troponins patient is adamant that she does not wish to be admitted or observed in the hospital and would like to be discharged.  She has been here now for 6 hours without any evidence of syncope even with episodes of bradycardia.  There is no evidence that she is having acute coronary syndrome.  Patient will be discharged to follow and close follow-up with her cardiologist.  Family and patient are*comfortable with that plan and understand to seek immediate medical attention.       Final  Clinical Impression(s) / ED Diagnoses Final diagnoses:  None    Rx / DC Orders ED Discharge Orders     None         Arthor Captain, PA-C 08/13/22 1143    Terrilee Files, MD 08/14/22 1035

## 2022-08-12 NOTE — ED Triage Notes (Signed)
Pt arrives via POV with family. Pt was seen at [facility] in [place] and was told to come to the ED. Pt believes she has passed out multiple times today while sitting at home. Pt reports her watch showed that her HR was in the 30s. Pt arrives AxOx4. Pt denies cp.

## 2022-08-13 NOTE — Discharge Instructions (Signed)
Contact a health care provider if: You feel light-headed or dizzy. You almost faint. You feel weak or are easily fatigued during physical activity. You experience confusion or have memory problems. Get help right away if: You faint. You have chest pains or an irregular heartbeat (palpitations). You have trouble breathing. These symptoms may represent a serious problem that is an emergency. Do not wait to see if the symptoms will go away. Get medical help right away. Call your local emergency services (911 in the [place] Do not drive yourself to the hospital. 

## 2023-02-06 ENCOUNTER — Other Ambulatory Visit: Payer: Self-pay

## 2023-02-06 ENCOUNTER — Inpatient Hospital Stay (HOSPITAL_COMMUNITY)
Admission: EM | Admit: 2023-02-06 | Discharge: 2023-02-09 | DRG: 280 | Disposition: A | Discharge status: Home Health | Payer: Medicare Other | Attending: Family Medicine | Admitting: Family Medicine

## 2023-02-06 ENCOUNTER — Encounter (HOSPITAL_COMMUNITY): Payer: Self-pay | Admitting: *Deleted

## 2023-02-06 ENCOUNTER — Emergency Department (HOSPITAL_COMMUNITY): Payer: Medicare Other

## 2023-02-06 DIAGNOSIS — Z885 Allergy status to narcotic agent status: Secondary | ICD-10-CM | POA: Diagnosis not present

## 2023-02-06 DIAGNOSIS — I447 Left bundle-branch block, unspecified: Secondary | ICD-10-CM | POA: Diagnosis present

## 2023-02-06 DIAGNOSIS — I214 Non-ST elevation (NSTEMI) myocardial infarction: Secondary | ICD-10-CM | POA: Diagnosis present

## 2023-02-06 DIAGNOSIS — E039 Hypothyroidism, unspecified: Secondary | ICD-10-CM | POA: Diagnosis present

## 2023-02-06 DIAGNOSIS — I5023 Acute on chronic systolic (congestive) heart failure: Secondary | ICD-10-CM | POA: Diagnosis not present

## 2023-02-06 DIAGNOSIS — I5021 Acute systolic (congestive) heart failure: Secondary | ICD-10-CM | POA: Diagnosis not present

## 2023-02-06 DIAGNOSIS — Z882 Allergy status to sulfonamides status: Secondary | ICD-10-CM

## 2023-02-06 DIAGNOSIS — Z881 Allergy status to other antibiotic agents status: Secondary | ICD-10-CM | POA: Diagnosis not present

## 2023-02-06 DIAGNOSIS — I5043 Acute on chronic combined systolic (congestive) and diastolic (congestive) heart failure: Secondary | ICD-10-CM | POA: Diagnosis present

## 2023-02-06 DIAGNOSIS — Z7982 Long term (current) use of aspirin: Secondary | ICD-10-CM

## 2023-02-06 DIAGNOSIS — I5042 Chronic combined systolic (congestive) and diastolic (congestive) heart failure: Secondary | ICD-10-CM | POA: Diagnosis present

## 2023-02-06 DIAGNOSIS — Z8674 Personal history of sudden cardiac arrest: Secondary | ICD-10-CM | POA: Diagnosis not present

## 2023-02-06 DIAGNOSIS — I493 Ventricular premature depolarization: Secondary | ICD-10-CM | POA: Diagnosis present

## 2023-02-06 DIAGNOSIS — Z66 Do not resuscitate: Secondary | ICD-10-CM | POA: Diagnosis present

## 2023-02-06 DIAGNOSIS — Z888 Allergy status to other drugs, medicaments and biological substances status: Secondary | ICD-10-CM | POA: Diagnosis not present

## 2023-02-06 DIAGNOSIS — Z880 Allergy status to penicillin: Secondary | ICD-10-CM | POA: Diagnosis not present

## 2023-02-06 DIAGNOSIS — I252 Old myocardial infarction: Secondary | ICD-10-CM

## 2023-02-06 DIAGNOSIS — Z91040 Latex allergy status: Secondary | ICD-10-CM

## 2023-02-06 DIAGNOSIS — I10 Essential (primary) hypertension: Secondary | ICD-10-CM | POA: Diagnosis present

## 2023-02-06 DIAGNOSIS — M170 Bilateral primary osteoarthritis of knee: Secondary | ICD-10-CM | POA: Diagnosis present

## 2023-02-06 DIAGNOSIS — Z79899 Other long term (current) drug therapy: Secondary | ICD-10-CM

## 2023-02-06 DIAGNOSIS — I472 Ventricular tachycardia, unspecified: Secondary | ICD-10-CM | POA: Diagnosis present

## 2023-02-06 DIAGNOSIS — E785 Hyperlipidemia, unspecified: Secondary | ICD-10-CM | POA: Diagnosis present

## 2023-02-06 DIAGNOSIS — I110 Hypertensive heart disease with heart failure: Secondary | ICD-10-CM | POA: Diagnosis present

## 2023-02-06 DIAGNOSIS — Z79890 Hormone replacement therapy (postmenopausal): Secondary | ICD-10-CM

## 2023-02-06 LAB — COMPREHENSIVE METABOLIC PANEL
ALT: 20 U/L (ref 0–44)
AST: 38 U/L (ref 15–41)
Albumin: 3.8 g/dL (ref 3.5–5.0)
Alkaline Phosphatase: 60 U/L (ref 38–126)
Anion gap: 12 (ref 5–15)
BUN: 24 mg/dL — ABNORMAL HIGH (ref 8–23)
CO2: 19 mmol/L — ABNORMAL LOW (ref 22–32)
Calcium: 9.2 mg/dL (ref 8.9–10.3)
Chloride: 104 mmol/L (ref 98–111)
Creatinine, Ser: 0.98 mg/dL (ref 0.44–1.00)
GFR, Estimated: 52 mL/min — ABNORMAL LOW (ref >60)
Glucose, Bld: 144 mg/dL — ABNORMAL HIGH (ref 70–99)
Potassium: 3.9 mmol/L (ref 3.5–5.1)
Sodium: 135 mmol/L (ref 135–145)
Total Bilirubin: 0.8 mg/dL (ref <1.2)
Total Protein: 6.2 g/dL — ABNORMAL LOW (ref 6.5–8.1)

## 2023-02-06 LAB — PHOSPHORUS: Phosphorus: 3.4 mg/dL (ref 2.5–4.6)

## 2023-02-06 LAB — URINALYSIS, COMPLETE (UACMP) WITH MICROSCOPIC
Bacteria, UA: NONE SEEN
Bilirubin Urine: NEGATIVE
Glucose, UA: NEGATIVE mg/dL
Hgb urine dipstick: NEGATIVE
Ketones, ur: NEGATIVE mg/dL
Leukocytes,Ua: NEGATIVE
Nitrite: NEGATIVE
Protein, ur: NEGATIVE mg/dL
Specific Gravity, Urine: 1.006 (ref 1.005–1.030)
pH: 7 (ref 5.0–8.0)

## 2023-02-06 LAB — CBC
HCT: 39 % (ref 36.0–46.0)
Hemoglobin: 12.8 g/dL (ref 12.0–15.0)
MCH: 31.3 pg (ref 26.0–34.0)
MCHC: 32.8 g/dL (ref 30.0–36.0)
MCV: 95.4 fL (ref 80.0–100.0)
Platelets: 298 10*3/uL (ref 150–400)
RBC: 4.09 MIL/uL (ref 3.87–5.11)
RDW: 15.5 % (ref 11.5–15.5)
WBC: 9.7 10*3/uL (ref 4.0–10.5)
nRBC: 0 % (ref 0.0–0.2)

## 2023-02-06 LAB — LIPASE, BLOOD: Lipase: 23 U/L (ref 11–51)

## 2023-02-06 LAB — T4, FREE: Free T4: 1.92 ng/dL — ABNORMAL HIGH (ref 0.61–1.12)

## 2023-02-06 LAB — TROPONIN I (HIGH SENSITIVITY)
Troponin I (High Sensitivity): 1424 ng/L (ref <18)
Troponin I (High Sensitivity): 1724 ng/L (ref <18)
Troponin I (High Sensitivity): 2356 ng/L (ref <18)

## 2023-02-06 LAB — BRAIN NATRIURETIC PEPTIDE: B Natriuretic Peptide: 1546.6 pg/mL — ABNORMAL HIGH (ref 0.0–100.0)

## 2023-02-06 LAB — CK: Total CK: 105 U/L (ref 38–234)

## 2023-02-06 LAB — MAGNESIUM: Magnesium: 2.2 mg/dL (ref 1.7–2.4)

## 2023-02-06 LAB — TSH: TSH: 1.176 u[IU]/mL (ref 0.350–4.500)

## 2023-02-06 LAB — I-STAT CG4 LACTIC ACID, ED: Lactic Acid, Venous: 1.1 mmol/L (ref 0.5–1.9)

## 2023-02-06 MED ORDER — ASPIRIN 81 MG PO CHEW
324.0000 mg | CHEWABLE_TABLET | Freq: Once | ORAL | Status: AC
  Administered 2023-02-06: 324 mg via ORAL
  Filled 2023-02-06: qty 4

## 2023-02-06 MED ORDER — HEPARIN BOLUS VIA INFUSION
2000.0000 [IU] | Freq: Once | INTRAVENOUS | Status: AC
Start: 2023-02-06 — End: 2023-02-06
  Administered 2023-02-06: 2000 [IU] via INTRAVENOUS
  Filled 2023-02-06: qty 2000

## 2023-02-06 MED ORDER — HEPARIN (PORCINE) 25000 UT/250ML-% IV SOLN
750.0000 [IU]/h | INTRAVENOUS | Status: AC
Start: 2023-02-06 — End: 2023-02-08
  Administered 2023-02-06: 750 [IU]/h via INTRAVENOUS
  Administered 2023-02-07: 1100 [IU]/h via INTRAVENOUS
  Filled 2023-02-06 (×3): qty 250

## 2023-02-06 NOTE — ED Provider Notes (Signed)
[place] EMERGENCY DEPARTMENT AT [facility] Provider Note   CSN: [id number] Arrival date & time: 02/06/23  1522     History  Chief Complaint  Patient presents with  . Chest Pain    Vicki [name] is a 89 y.o. female.   Chest Pain    Patient has a history of hypertension and hypothyroidism and non-ST elevation MI..  She presents to the ED for evaluation of an episode of chest pain.  Patient states she was eating this morning when she started experiencing pain in her chest.  It lasted for a couple of hours and is slowly resolved.  Patient's blood pressure was measured at home and was very elevated in the 170s systolic patient states she did have an episode of vomiting with it.  This reminded her of her heart attack that she had last month.  Medical records indicate patient was admitted to a [facility] hospital in November of this year.  He was diagnosed with a non-ST elevation myocardial infarction.  Patient had an elevated troponin of 66 that increased to 92.  Per the records her maximum troponin was 371.  She was treated conservatively.  No cardiac catheterization was performed.  Patient states she is feeling better now.  She denies any shortness of breath.  No abdominal pain.  Patient does have chronic leg swelling.  Home Medications Prior to Admission medications   Medication Sig Start Date End Date Taking? Authorizing Provider  aspirin EC 81 MG tablet Take 81 mg by mouth daily. Swallow whole.    [provider]  Biotin 10 MG CAPS Take 10 mg by mouth daily.    [provider]  Calcium Carb-Cholecalciferol (CALCIUM 600/VITAMIN D PO) Take 1 tablet by mouth daily.    [provider]  furosemide (LASIX) 20 MG tablet Take 20 mg by mouth daily. 09/13/20   [provider]  levothyroxine (SYNTHROID) 50 MCG tablet Take 50 mcg by mouth daily. 09/05/13   [provider]  lisinopril (ZESTRIL) 5 MG tablet Take 5 mg by mouth daily. 11/13/20    [provider]  METAMUCIL FIBER PO Take by mouth at bedtime.    [provider]  metoprolol tartrate (LOPRESSOR) 25 MG tablet Take 25 mg by mouth 2 (two) times daily. 11/30/20   [provider]  Multiple Vitamins-Minerals (THERA-M) TABS Take by mouth.    [provider]  Omega-3 Fatty Acids (FISH OIL) 1000 MG CAPS Take 1,000 mg by mouth daily.    [provider]  omeprazole (PRILOSEC) 20 MG capsule Take 20 mg by mouth daily.    [provider]  oxybutynin (DITROPAN-XL) 5 MG 24 hr tablet Take 5 mg by mouth daily. 07/09/14   [provider]      Allergies    Codeine, Latex, Penicillins, Sulfa antibiotics, and Thiopental    Review of Systems   Review of Systems  Cardiovascular:  Positive for chest pain.    Physical Exam Updated Vital Signs BP 132/61 (BP Location: Right Arm)   Pulse 86   Temp 98.4 F (36.9 C)   Resp 16   Ht 1.575 m (5\' 2" )   Wt 66.2 kg   SpO2 95%   BMI 26.69 kg/m  Physical Exam Vitals and nursing note reviewed.  Constitutional:      General: She is not in acute distress.    Appearance: She is well-developed.  HENT:     Head: Normocephalic and atraumatic.     Right  Ear: External ear normal.     Left Ear: External ear normal.  Eyes:     General: No scleral icterus.       Right eye: No discharge.        Left eye: No discharge.     Conjunctiva/sclera: Conjunctivae normal.  Neck:     Trachea: No tracheal deviation.  Cardiovascular:     Rate and Rhythm: Normal rate and regular rhythm.  Pulmonary:     Effort: Pulmonary effort is normal. No respiratory distress.     Breath sounds: Normal breath sounds. No stridor. No wheezing or rales.  Abdominal:     General: Bowel sounds are normal. There is no distension.     Palpations: Abdomen is soft.     Tenderness: There is no abdominal tenderness. There is no guarding or rebound.  Musculoskeletal:        General: No tenderness or deformity.     Cervical  back: Neck supple.     Right lower leg: Edema present.     Left lower leg: Edema present.  Skin:    General: Skin is warm and dry.     Findings: No rash.  Neurological:     General: No focal deficit present.     Mental Status: She is alert.     Cranial Nerves: No cranial nerve deficit, dysarthria or facial asymmetry.     Sensory: No sensory deficit.     Motor: No abnormal muscle tone or seizure activity.     Coordination: Coordination normal.  Psychiatric:        Mood and Affect: Mood normal.     ED Results / Procedures / Treatments   Labs (all labs ordered are listed, but only abnormal results are displayed) Labs Reviewed  COMPREHENSIVE METABOLIC PANEL - Abnormal; Notable for the following components:      Result Value   CO2 19 (*)    Glucose, Bld 144 (*)    BUN 24 (*)    Total Protein 6.2 (*)    GFR, Estimated 52 (*)    All other components within normal limits  TROPONIN I (HIGH SENSITIVITY) - Abnormal; Notable for the following components:   Troponin I (High Sensitivity) 1,424 (*)    All other components within normal limits  CBC  LIPASE, BLOOD  BRAIN NATRIURETIC PEPTIDE  HEPARIN LEVEL (UNFRACTIONATED)  TROPONIN I (HIGH SENSITIVITY)    EKG EKG Interpretation Date/Time:  Saturday February 06 2023 15:53:50 EST Ventricular Rate:  84 PR Interval:  134 QRS Duration:  140 QT Interval:  404 QTC Calculation: 477 R Axis:   -66  Text Interpretation: Sinus rhythm with occasional Premature ventricular complexes Left axis deviation Left bundle branch block Abnormal ECG When compared with ECG of 12-Aug-2022 20:07, similar appearance to prior eCG Confirmed by Linwood Dibbles [id number]) on 02/06/2023 6:46:30 PM  Radiology DG Chest 2 View  Result Date: 02/06/2023 CLINICAL DATA:  Chest pain, nausea vomiting, diarrhea EXAM: CHEST - 2 VIEW COMPARISON:  08/12/2022 FINDINGS: Frontal and lateral views of the chest demonstrate mild enlargement of the cardiac silhouette. Stable ectasia and  atherosclerosis of the thoracic aorta. Mild central vascular congestion and increased interstitial prominence, which may reflect mild volume overload. No focal consolidation, effusion, or pneumothorax. No acute bony abnormalities. IMPRESSION: 1. Findings consistent with mild congestive heart failure and developing interstitial edema. Electronically Signed   By: Sharlet Salina M.D.   On: 02/06/2023 17:34    Procedures .Critical Care  Performed by: Linwood Dibbles, MD  Authorized by: Linwood Dibbles, MD   Critical care provider statement:    Critical care time (minutes):  30   Critical care was time spent personally by me on the following activities:  Development of treatment plan with patient or surrogate, discussions with consultants, evaluation of patient's response to treatment, examination of patient, ordering and review of laboratory studies, ordering and review of radiographic studies, ordering and performing treatments and interventions, pulse oximetry, re-evaluation of patient's condition and review of old charts     Medications Ordered in ED Medications  aspirin chewable tablet 324 mg (has no administration in time range)  heparin ADULT infusion 100 units/mL (25000 units/284mL) (has no administration in time range)  heparin bolus via infusion 2,000 Units (has no administration in time range)    ED Course/ Medical Decision Making/ A&P Clinical Course as of 02/06/23 1949  Sat Feb 06, 2023  1715 CBC CBC normal [JK]  1833 Pt confirms pain free at this time.   Will start heparin [JK]  1846 No significant metabolic abnormalities.  Lipase normal. [JK]  1847 x-ray suggest mild CHF. [JK]  1847 Case discussed with cardiology.  Will see patient in consultation.  Will request medical admission [JK]  1949 Case discussed with Dr Adela Glimpse [JK]    Clinical Course User Index [JK] Linwood Dibbles, MD                                 Medical Decision Making Frontal diagnosis includes but not limited to  NSTEMI, pneumonia, pneumothorax, gastroesophageal reflux  Problems Addressed: NSTEMI (non-ST elevated myocardial infarction) [facility]): acute illness or injury that poses a threat to life or bodily functions  Amount and/or Complexity of Data Reviewed Labs: ordered. Decision-making details documented in ED Course. Radiology: ordered and independent interpretation performed.  Risk OTC drugs. Decision regarding hospitalization.   Patient presented to the ED with an episode of chest pain nausea and vomiting.  Patient has history of NSTEMI last month.  She states her presentation is similar.  Patient remained pain-free in the ED.  No acute ischemic changes noted on EKG initially.  Initial troponin however significantly elevated 1424.  Afrin and aspirin has been ordered.  I have consulted with cardiology.  Will plan on admission to the hospital for further treatment and evaluation.  Plan were discussed with the patient and her family         Final Clinical Impression(s) / ED Diagnoses Final diagnoses:  NSTEMI (non-ST elevated myocardial infarction) [facility])    Rx / DC Orders ED Discharge Orders     None         Linwood Dibbles, MD 02/06/23 1949

## 2023-02-06 NOTE — Consult Note (Signed)
Cardiology Consultation   Patient ID: Vicki [name] MRN: [id number]; DOB: 08-31-1923  Admit date: 02/06/2023 Date of Consult: 02/06/2023  PCP:  Deloris Ping, MD   Vicki [name] Providers Cardiologist:  None        Patient Profile:   Vicki [name] is a 89 y.o. female with a hx of HTN, HLD, hypothyroidism, recent NSTEMI medically managed and newly diagnosed systolic heart failure with mildly reduced LVEF (40-45%) who is being seen 02/06/2023 for the evaluation of NSTEMI at the request of emergency department.  History of Present Illness:   Vicki [name] is a very pleasant 89 yo woman who presented to the emergency department with a chief concern of substernal chest pain that she describes as "indigestion" with radiation to her left arm.   Onset of pain was earlier this morning while she was eating breakfast.  She describes the sensation as "indigestion" and pressure, with associated nausea and diarrhea.  No vomiting or shortness of breath.  She took tums which did help with her symptoms.  She states that symptoms were present for hours before resolution.  She lives with her granddaughter, who called patients daughter, and recommended that she come to the ER for further evaluation. She was chest pain free at that time, and remains chest pain free at the time of evaluation in the ER.  Trop returned at ~1400.  BNP pending. She has lower extremity edema, has not been wearing her compression stockings at home.  Takes lasix daily with good urinary output.  No orthopnea or PND.   She had a similar presentation to [facility] about 1 mo ago, diagnosed with an NSTEMI and was told she was not a candidate for heart catheterization at that time.  She was started on plavix and carvedilol.  She has not had any symptoms prior to this AM.  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  She  was not on aspirin due to lower extremity bruising.     Past Medical History:  Diagnosis Date   Hypertension    Hypothyroidism     History reviewed. No pertinent surgical history.     Inpatient Medications: Scheduled Meds:   Continuous Infusions:  heparin 750 Units/hr (02/06/23 1914)   PRN Meds:   Allergies:    Allergies  Allergen Reactions   Codeine Nausea Only    Makes her crazy Hallucinations    Latex Itching   Penicillins Rash    YEAST INFECTION  YEAST INFECTION   Sulfa Antibiotics Anaphylaxis and Other (See Comments)    Heart stops   Thiopental Anaphylaxis and Rash    Other reaction(s): Unknown  CARDIAC ARREST  CARDIAC ARREST    Social History:   Social History   Socioeconomic History   Marital status: Widowed    Spouse name: Not on file   Number of children: Not on file   Years of education: Not on file   Highest education level: Not on file  Occupational History   Not on file  Tobacco Use   Smoking status: Never   Smokeless tobacco: Never  Substance and Sexual Activity   Alcohol use: Never   Drug use: Never   Sexual activity: Not on file  Other Topics Concern   Not on file  Social History Narrative   Not on file   Social Determinants of Health   Financial Resource Strain: Low  Risk  (09/06/2022)   Received from [facility]   Overall Financial Resource Strain (CARDIA)    Difficulty of Paying Living Expenses: Not hard at all  Food Insecurity: No Food Insecurity (01/02/2023)   Received from [facility]   Hunger Vital Sign    Worried About Running Out of Food in the Last Year: Never true    Ran Out of Food in the Last Year: Never true  Transportation Needs: No Transportation Needs (01/03/2023)   Received from [facility] - Transportation    Lack of Transportation (Medical): No    Lack of Transportation (Non-Medical): No  Physical Activity: Unknown (09/06/2022)   [facility] from [facility]   Exercise Vital Sign    Days of  Exercise per Week: 0 days    Minutes of Exercise per Session: Not on file  Stress: No Stress Concern Present (01/02/2023)   Received from [facility] - Occupational Stress Questionnaire    Feeling of Stress : Not at all  Social Connections: Socially Integrated (09/06/2022)   Received from [facility]   Social Network    How would you rate your social network (family, work, friends)?: Good participation with social networks  Intimate Partner Violence: Not At Risk (01/02/2023)   Received from [facility]   HITS    Over the last 12 months how often did your partner physically hurt you?: Never    Over the last 12 months how often did your partner insult you or talk down to you?: Never    Over the last 12 months how often did your partner threaten you with physical harm?: Never    Over the last 12 months how often did your partner scream or curse at you?: Never    Family History:   History reviewed. No pertinent family history.   ROS:  Please see the history of present illness.  All other ROS reviewed and negative.     Physical Exam/Data:   Vitals:   02/06/23 1548 02/06/23 1603 02/06/23 1830  BP: 132/61  (!) 118/53  Pulse: 86  84  Resp: 16  17  Temp: 98.4 F (36.9 C)    SpO2: 95%  95%  Weight:  66.2 kg   Height:  5\' 2"  (1.575 m)    No intake or output data in the 24 hours ending 02/06/23 1928    02/06/2023    4:03 PM 08/12/2022    6:51 PM  Last 3 Weights  Weight (lbs) 145 lb 15.1 oz 146 lb  Weight (kg) 66.2 kg 66.225 kg     Body mass index is 26.69 kg/m.  General:  Well nourished, well developed, in no acute distress HEENT: normal Neck: JVP seen at the mid neck with patient at 45 degrees  Vascular: No carotid bruits; Distal pulses 2+ bilaterally Cardiac:  normal S1, S2; NRRR; intermittent early beats, no murmur  Lungs:  clear to auscultation bilaterally, no wheezing, rhonchi or rales  Abd: soft, nontender, no hepatomegaly   Ext: bilateral pitting lower extremity edema Musculoskeletal:  No deformities, BUE and BLE strength normal and equal Skin: warm and dry  Neuro:  CNs 2-12 intact, no focal abnormalities noted Psych:  Normal affect   EKG:  The EKG was personally reviewed and demonstrates:  normal sinus rhythm with premature ventricular contractions, LBBB and LAD    Relevant CV Studies: Echocardiogram reviewed 01/13/23:  LVEF 40-45%, moderately dilated LV, mild hypokinesis of the LV, LA  volume index severely increased  Laboratory Data:  High Sensitivity Troponin:   Recent Labs  Lab 02/06/23 1637  TROPONINIHS 1,424*     Chemistry Recent Labs  Lab 02/06/23 1637  NA 135  K 3.9  CL 104  CO2 19*  GLUCOSE 144*  BUN 24*  CREATININE 0.98  CALCIUM 9.2  GFRNONAA 52*  ANIONGAP 12    Recent Labs  Lab 02/06/23 1637  PROT 6.2*  ALBUMIN 3.8  AST 38  ALT 20  ALKPHOS 60  BILITOT 0.8   Lipids No results for input(s): "CHOL", "TRIG", "HDL", "LABVLDL", "LDLCALC", "CHOLHDL" in the last 168 hours.  Hematology Recent Labs  Lab 02/06/23 1637  WBC 9.7  RBC 4.09  HGB 12.8  HCT 39.0  MCV 95.4  MCH 31.3  MCHC 32.8  RDW 15.5  PLT 298   Thyroid No results for input(s): "TSH", "FREET4" in the last 168 hours.  BNPNo results for input(s): "BNP", "PROBNP" in the last 168 hours.  DDimer No results for input(s): "DDIMER" in the last 168 hours.   Radiology/Studies:  DG Chest 2 View  Result Date: 02/06/2023 CLINICAL DATA:  Chest pain, nausea vomiting, diarrhea EXAM: CHEST - 2 VIEW COMPARISON:  08/12/2022 FINDINGS: Frontal and lateral views of the chest demonstrate mild enlargement of the cardiac silhouette. Stable ectasia and atherosclerosis of the thoracic aorta. Mild central vascular congestion and increased interstitial prominence, which may reflect mild volume overload. No focal consolidation, effusion, or pneumothorax. No acute bony abnormalities. IMPRESSION: 1. Findings consistent with mild  congestive heart failure and developing interstitial edema. Electronically Signed   By: Sharlet Salina M.D.   On: 02/06/2023 17:34     Assessment and Plan:  This is a 89 yo woman with a hx of HTN, HLD, hypothyroidism, recent NSTEMI medically managed and newly diagnosed systolic heart failure with mildly reduced LVEF (40-45%) who is being seen 02/06/2023 for the evaluation of NSTEMI at the request of emergency department.  She appears hypervolemic on exam consistent with acute exacerbation of her recently diagnosed mild LV systolic dysfunction.  BNP is pending, but she would benefit from intravenous diuresis.   Her symptoms of chest pain and elevated troponin are concerning for type 1 NSTEMI.  Trend of her troponin will be important.    NSTEMI, type 1 vs type 2 in the setting of HF - recommend trending troponin value to peak  - continue aspirin 81 mg daily  - continue high intensity statin (crestor 20 mg daily)  - recommend restarting carvedilol at 3.125 mg BID  - agree with heparin infusion ACS protocol x48 hours for medical management of her NSTEMI  - recommend repeat limited TTE for EF and wall motion  - will discuss with the interventional cardiologists candidacy for heart catheterization (patient not interested in pursuing procedure at the time of initial interview)   Acute on chronic systolic heart failure  - would give 40 mg IV lasix with goal net negative 2 liters next 24 hours  - monitor strict intake and output  - daily standing weights  - 2 liter fluid restriction    Risk Assessment/Risk Scores:     TIMI Risk Score for Unstable Angina or Non-ST Elevation MI:   The patient's TIMI risk score is 4, which indicates a 20% risk of all cause mortality, new or recurrent myocardial infarction or need for urgent revascularization in the next 14 days.  New York Heart Association (NYHA) Functional Class NYHA Class II   For questions or updates,  please contact [place]  [name] Please consult [email or web address] for contact info under    Signed, Sharyon Medicus, MD  02/06/2023 7:28 PM

## 2023-02-06 NOTE — Assessment & Plan Note (Signed)
As per cardiology if able to tolerate would attempt to restart carvedilol at 3.125 mg BID

## 2023-02-06 NOTE — ED Triage Notes (Signed)
The pt is c/o chest pain with n v and diarrhea also  some sob

## 2023-02-06 NOTE — Progress Notes (Signed)
ANTICOAGULATION CONSULT NOTE - Initial Consult  Pharmacy Consult for Heparin Indication: chest pain/ACS  Allergies  Allergen Reactions   Codeine Nausea Only    Makes her crazy Hallucinations    Latex Itching   Penicillins Rash    YEAST INFECTION  YEAST INFECTION   Sulfa Antibiotics Anaphylaxis and Other (See Comments)    Heart stops   Thiopental Anaphylaxis and Rash    Other reaction(s): Unknown  CARDIAC ARREST  CARDIAC ARREST    Patient Measurements: Height: 5\' 2"  (157.5 cm) Weight: 66.2 kg (145 lb 15.1 oz) IBW/kg (Calculated) : 50.1 Heparin Dosing Weight: 63.7 kg  Vital Signs: Temp: 98.4 F (36.9 C) (12/07 1548) BP: 132/61 (12/07 1548) Pulse Rate: 86 (12/07 1548)  Labs: Recent Labs    02/06/23 1637  HGB 12.8  HCT 39.0  PLT 298  CREATININE 0.98  TROPONINIHS 1,424*    Estimated Creatinine Clearance: 29.3 mL/min (by C-G formula based on SCr of 0.98 mg/dL).   Medical History: Past Medical History:  Diagnosis Date   Hypertension    Hypothyroidism     Assessment: 53 yof with a history of HTN, STEMI, and hypothyroidism. Patient is presenting with chest pain. Heparin per pharmacy consult placed for chest pain/ACS.  Patient is not on anticoagulation prior to arrival.  Hgb 12.8; plt 298 hsTrop 1424  Goal of Therapy:  Heparin level 0.3-0.7 units/ml Monitor platelets by anticoagulation protocol: Yes   Plan:  Will give conservative 2000 unit IV heparin bolus Start heparin infusion at 750 units/hr Check anti-Xa level in 8 hours and daily while on heparin Continue to monitor H&H and platelets  Delmar Landau, PharmD, BCPS 02/06/2023 6:46 PM ED Clinical Pharmacist -  [phone]

## 2023-02-06 NOTE — Assessment & Plan Note (Signed)
-   Check TSH continue home medications Synthroid at po q day

## 2023-02-06 NOTE — ED Notes (Signed)
ED TO INPATIENT HANDOFF REPORT  ED Nurse Name and Phone #:  Lucious Groves [phone]  S Name/Age/Gender Vicki [name] 89 y.o. female Room/Bed: 031C/031C  Code Status   Code Status: Limited: Do not attempt resuscitation (DNR) -DNR-LIMITED -Do Not Intubate/DNI   Home/SNF/Other Home Patient oriented to: self, place, time, and situation Is this baseline? Yes   Triage Complete: Triage complete  Chief Complaint NSTEMI (non-ST elevated myocardial infarction) (HCC) [I21.4]  Triage Note The pt is c/o chest pain with n v and diarrhea also  some sob   Allergies Allergies  Allergen Reactions   Codeine Nausea Only    Makes her crazy Hallucinations    Latex Itching   Penicillins Rash    YEAST INFECTION  YEAST INFECTION   Sulfa Antibiotics Anaphylaxis and Other (See Comments)    Heart stops   Thiopental Anaphylaxis and Rash    Other reaction(s): Unknown  CARDIAC ARREST  CARDIAC ARREST    Level of Care/Admitting Diagnosis ED Disposition     ED Disposition  Admit   Condition  --   Comment  Hospital [facility]: [facility] [[id number]]  Level of Care: Progressive [102]  Admit to Progressive based on following criteria: CARDIOVASCULAR & THORACIC of moderate stability with acute coronary syndrome symptoms/low risk myocardial infarction/hypertensive urgency/arrhythmias/heart failure potentially compromising stability and stable post cardiovascular intervention patients.  May admit patient to [facility] or [facility] if equivalent level of care is available:: No  Covid Evaluation: Asymptomatic - no recent exposure (last 10 days) testing not required  Diagnosis: NSTEMI (non-ST elevated myocardial infarction) [facility]) [914782]  Admitting Physician: Therisa Doyne [[id number]]  Attending Physician: Therisa Doyne [[id number]]  Certification:: I certify this patient will need inpatient services for at least 2 midnights  Expected Medical Readiness: 02/09/2023           B Medical/Surgery History Past Medical History:  Diagnosis Date   Hypertension    Hypothyroidism    History reviewed. No pertinent surgical history.   A IV Location/Drains/Wounds Patient Lines/Drains/Airways Status     Active Line/Drains/Airways     Name Placement date Placement time Site Days   Peripheral IV 02/06/23 20 G Anterior;Distal;Right Forearm 02/06/23  2010  Forearm  less than 1            Intake/Output Last 24 hours No intake or output data in the 24 hours ending 02/06/23 2149  Labs/Imaging Results for orders placed or performed during the hospital encounter of 02/06/23 (from the past 48 hour(s))  CBC     Status: None   Collection Time: 02/06/23  4:37 PM  Result Value Ref Range   WBC 9.7 4.0 - 10.5 K/uL   RBC 4.09 3.87 - 5.11 MIL/uL   Hemoglobin 12.8 12.0 - 15.0 g/dL   HCT 95.6 21.3 - 08.6 %   MCV 95.4 80.0 - 100.0 fL   MCH 31.3 26.0 - 34.0 pg   MCHC 32.8 30.0 - 36.0 g/dL   RDW 57.8 46.9 - 62.9 %   Platelets 298 150 - 400 K/uL   nRBC 0.0 0.0 - 0.2 %    Comment: Performed at [facility], [address] [place], [place] [address]  Troponin I (High Sensitivity)     Status: Abnormal   Collection Time: 02/06/23  4:37 PM  Result Value Ref Range   Troponin I (High Sensitivity) 1,424 (HH) <18 ng/L    Comment: CRITICAL RESULT CALLED TO, READ BACK BY AND VERIFIED WITH Merlinda Frederick  RN ,  @1811 , 02/06/23 , [place], T. (NOTE) Elevated high sensitivity troponin I (hsTnI) values and significant  changes across serial measurements may suggest ACS but many other  chronic and acute conditions are known to elevate hsTnI results.  Refer to the "Links" section for chest pain algorithms and additional  guidance. Performed at [facility], [address] [place], [place] [address]   Comprehensive metabolic panel     Status: Abnormal   Collection Time: 02/06/23  4:37 PM  Result Value Ref Range   Sodium 135 135 - 145 mmol/L   Potassium 3.9 3.5 - 5.1  mmol/L   Chloride 104 98 - 111 mmol/L   CO2 19 (L) 22 - 32 mmol/L   Glucose, Bld 144 (H) 70 - 99 mg/dL    Comment: Glucose reference range applies only to samples taken after fasting for at least 8 hours.   BUN 24 (H) 8 - 23 mg/dL   Creatinine, Ser 6.21 0.44 - 1.00 mg/dL   Calcium 9.2 8.9 - 30.8 mg/dL   Total Protein 6.2 (L) 6.5 - 8.1 g/dL   Albumin 3.8 3.5 - 5.0 g/dL   AST 38 15 - 41 U/L   ALT 20 0 - 44 U/L   Alkaline Phosphatase 60 38 - 126 U/L   Total Bilirubin 0.8 <1.2 mg/dL   GFR, Estimated 52 (L) >60 mL/min    Comment: (NOTE) Calculated using the CKD-EPI Creatinine Equation (2021)    Anion gap 12 5 - 15    Comment: Performed at [facility], [address], [place], [place] [address]  Lipase, blood     Status: None   Collection Time: 02/06/23  4:37 PM  Result Value Ref Range   Lipase 23 11 - 51 U/L    Comment: Performed at [facility], [address], [place], [place] [address]  Brain natriuretic peptide     Status: Abnormal   Collection Time: 02/06/23  4:37 PM  Result Value Ref Range   B Natriuretic Peptide 1,546.6 (H) 0.0 - 100.0 pg/mL    Comment: Performed at [facility], [address] [place], [place] [address]  Troponin I (High Sensitivity)     Status: Abnormal   Collection Time: 02/06/23  6:08 PM  Result Value Ref Range   Troponin I (High Sensitivity) 1,724 (HH) <18 ng/L    Comment: CRITICAL VALUE NOTED. VALUE IS CONSISTENT WITH PREVIOUSLY REPORTED/CALLED VALUE (NOTE) Elevated high sensitivity troponin I (hsTnI) values and significant  changes across serial measurements may suggest ACS but many other  chronic and acute conditions are known to elevate hsTnI results.  Refer to the "Links" section for chest pain algorithms and additional  guidance. Performed at [facility], [address], [place], [place] [address]   Urinalysis, Complete w Microscopic -Urine, Clean Catch     Status: Abnormal   Collection Time: 02/06/23  8:41 PM   Result Value Ref Range   Color, Urine STRAW (A) YELLOW   APPearance CLEAR CLEAR   Specific Gravity, Urine 1.006 1.005 - 1.030   pH 7.0 5.0 - 8.0   Glucose, UA NEGATIVE NEGATIVE mg/dL   Hgb urine dipstick NEGATIVE NEGATIVE   Bilirubin Urine NEGATIVE NEGATIVE   Ketones, ur NEGATIVE NEGATIVE mg/dL   Protein, ur NEGATIVE NEGATIVE mg/dL   Nitrite NEGATIVE NEGATIVE   Leukocytes,Ua NEGATIVE NEGATIVE   RBC / HPF 0-5 0 - 5 RBC/hpf   WBC, UA 0-5 0 - 5  WBC/hpf   Bacteria, UA NONE SEEN NONE SEEN   Squamous Epithelial / HPF 0-5 0 - 5 /HPF    Comment: Performed at [facility], [address] [place], [place] [address]   DG Chest 2 View  Result Date: 02/06/2023 CLINICAL DATA:  Chest pain, nausea vomiting, diarrhea EXAM: CHEST - 2 VIEW COMPARISON:  08/12/2022 FINDINGS: Frontal and lateral views of the chest demonstrate mild enlargement of the cardiac silhouette. Stable ectasia and atherosclerosis of the thoracic aorta. Mild central vascular congestion and increased interstitial prominence, which may reflect mild volume overload. No focal consolidation, effusion, or pneumothorax. No acute bony abnormalities. IMPRESSION: 1. Findings consistent with mild congestive heart failure and developing interstitial edema. Electronically Signed   By: Sharlet Salina M.D.   On: 02/06/2023 17:34    Pending Labs Unresulted Labs (From admission, onward)     Start     Ordered   02/08/23 0500  Heparin level (unfractionated)  Daily,   R      02/06/23 1848   02/07/23 0500  Prealbumin  Tomorrow morning,   R        02/06/23 1930   02/07/23 0300  Heparin level (unfractionated)  Once-Timed,   URGENT        02/06/23 1848   02/06/23 2128  TSH  Once,   R        02/06/23 2128   02/06/23 1931  Blood gas, venous  ONCE - STAT,   STAT       Question:  Release to patient  Answer:  Immediate   02/06/23 1930   02/06/23 1931  CK  Add-on,   AD        02/06/23 1930   02/06/23 1931  Magnesium  Add-on,   AD        02/06/23  1930   02/06/23 1931  Phosphorus  Add-on,   AD        02/06/23 1930   02/06/23 1931  T4, free  Add-on,   AD        02/06/23 1930   02/06/23 1931  T3  Add-on,   AD        02/06/23 1930   Signed and Held  Lipoprotein A (LPA)  Tomorrow morning,   R        Signed and Held   Signed and Held  Comprehensive metabolic panel  Tomorrow morning,   R        Signed and Held   Signed and Held  Hemoglobin A1c  Add-on,   R        Signed and Held   Signed and Held  CBC with Differential/Platelet  Tomorrow morning,   R        Signed and Held            Vitals/Pain Today's Vitals   02/06/23 1830 02/06/23 1900 02/06/23 1930 02/06/23 2016  BP: (!) 118/53 130/79 130/70   Pulse: 84 89 89   Resp: 17 17 16    Temp:    98.4 F (36.9 C)  TempSrc:    Oral  SpO2: 95% 95% 97%   Weight:    66.2 kg  Height:    5\' 2"  (1.575 m)  PainSc:        Isolation Precautions No active isolations  Medications Medications  heparin ADULT infusion 100 units/mL (25000 units/246mL) (750 Units/hr Intravenous New Bag/Given 02/06/23 1914)  aspirin chewable tablet 324 mg (324 mg Oral Given 02/06/23 1911)  heparin  bolus via infusion 2,000 Units (2,000 Units Intravenous Bolus from Bag 02/06/23 1915)    Mobility walks     Focused Assessments     R Recommendations: See Admitting Provider Note  Report given to:   Additional Notes:

## 2023-02-06 NOTE — H&P (Signed)
Vicki [name] ZOX:[id number] DOB: [birth year] DOA: 02/06/2023     PCP: Deloris Ping, MD   Outpatient Specialists:  CARDS:   Dr. Reuel Boom    Patient arrived to ER on 02/06/23 at 1522 Referred by Attending Linwood Dibbles, MD   Patient coming from:    home Lives alone,      Chief Complaint:   Chief Complaint  Patient presents with   Chest Pain    HPI: Vicki [name] is a 89 y.o. female with medical history significant of hypertension, NSTEMI, hypothyroidism, hx of CHF    Presented with chest pain Patient presents for chest pain associated nausea vomiting diarrhea and some shortness of breath History of NSTEMI in the past Chest pain associated when he was eating this morning pain lasted for few hours and slowly resolved blood pressure was up to 170s Associated nausea vomiting similar to when she had her heart attack last month Patient had an admission to [facility] where she had an NSTEMI her troponin maximum was 371 and treated conservatively No cardiac cath was performed at the time At this point chest pain free no associated chest shortness of breath no abdominal pain Patient endorses leg edema but that is chronic Troponin noted to be elevated at 1400  Pt has been off aspirin due to bruising Has been off Plavix for the past few weeks bc needed costisone shots in the knees    Denies significant ETOH intake   Does not smoke   No results found for: "SARSCOV2NAA"      Regarding pertinent Chronic problems:       HTN on COreg   chronic CHF  systolic/   - last echo   Left ventricle is moderately dilated. EF: 40-45%. Quantitative analysis of left ventricular Global Longitudinal Strain (GLS) imaging is -5.400%, which is abnormal. Ejection fraction measured by 3D is 44%, which is abnormal. There is mild hypokinesis of the left ventricle.   NSTEMI  - On Aspirin, statin, betablocker now off Plavix                 - followed by cardiology                    Hypothyroidism:   Lab Results  Component Value Date   TSH 1.769 08/12/2022   on synthroid    While in ER: Clinical Course as of 02/06/23 1950  Sat Feb 06, 2023  1715 CBC CBC normal [JK]  1833 Pt confirms pain free at this time.   Will start heparin [JK]  1846 No significant metabolic abnormalities.  Lipase normal. [JK]  1847 x-ray suggest mild CHF. [JK]  1847 Case discussed with cardiology.  Will see patient in consultation.  Will request medical admission [JK]  1949 Case discussed with Dr Adela Glimpse [JK]    Clinical Course User Index [JK] Linwood Dibbles, MD      Lab Orders         CBC         Comprehensive metabolic panel         Lipase, blood         Brain natriuretic peptide         Heparin level (unfractionated)         Heparin level (unfractionated)      CXR - CHF    Following Medications were ordered in ER: Medications  heparin ADULT infusion 100 units/mL (25000 units/235mL) (750 Units/hr Intravenous New Bag/Given 02/06/23 1914)  aspirin chewable tablet 324 mg (324 mg Oral Given 02/06/23 1911)  heparin bolus via infusion 2,000 Units (2,000 Units Intravenous Bolus from Bag 02/06/23 1915)    _______________________________________________________ ER Provider Called:      Cardiology  Dr. Jean Rosenthal They Recommend admit to medicine  recommend trending troponin value to peak  - continue aspirin 81 mg daily  - continue high intensity statin (crestor 20 mg daily)  - recommend restarting carvedilol at 3.125 mg BID  - agree with heparin infusion ACS protocol x48 hours for medical management of her NSTEMI  - recommend repeat limited TTE for EF and wall motion  - will discuss with the interventional cardiologists candidacy for heart catheterization (patient not interested in pursuing procedure at the time of initial interview)   SEEN in ER     ED Triage Vitals  Encounter Vitals Group     BP 02/06/23 1548 132/61     Systolic BP Percentile --      Diastolic BP Percentile --       Pulse Rate 02/06/23 1548 86     Resp 02/06/23 1548 16     Temp 02/06/23 1548 98.4 F (36.9 C)     Temp src --      SpO2 02/06/23 1548 95 %     Weight 02/06/23 1603 145 lb 15.1 oz (66.2 kg)     Height 02/06/23 1603 5\' 2"  (1.575 m)     Head Circumference --      Peak Flow --      Pain Score 02/06/23 1603 0     Pain Loc --      Pain Education --      Exclude from Growth Chart --   ZOXW(96)@     _________________________________________ Significant initial  Findings: Abnormal Labs Reviewed  COMPREHENSIVE METABOLIC PANEL - Abnormal; Notable for the following components:      Result Value   CO2 19 (*)    Glucose, Bld 144 (*)    BUN 24 (*)    Total Protein 6.2 (*)    GFR, Estimated 52 (*)    All other components within normal limits  TROPONIN I (HIGH SENSITIVITY) - Abnormal; Notable for the following components:   Troponin I (High Sensitivity) 1,424 (*)    All other components within normal limits   _______________ Troponin  ordered Cardiac Panel (last 3 results) Recent Labs    02/06/23 1637  TROPONINIHS 1,424*     ECG: Ordered Personally reviewed and interpreted by me showing: HR : 84 Rhythm:Sinus rhythm with occasional Premature ventricular complexes Left axis deviation Left bundle branch block Abnormal ECG When compared with ECG  QTC 477   The recent clinical data is shown below. Vitals:   02/06/23 1548 02/06/23 1603 02/06/23 1830  BP: 132/61  (!) 118/53  Pulse: 86  84  Resp: 16  17  Temp: 98.4 F (36.9 C)    SpO2: 95%  95%  Weight:  66.2 kg   Height:  5\' 2"  (1.575 m)     WBC     Component Value Date/Time   WBC 9.7 02/06/2023 1637    Lactic Acid, Venous No results found for: "LATICACIDVEN"    UA  ordered     __________________________________________________________ Recent Labs  Lab 02/06/23 1637  NA 135  K 3.9  CO2 19*  GLUCOSE 144*  BUN 24*  CREATININE 0.98  CALCIUM 9.2    Cr   stable,    Lab Results  Component Value Date  CREATININE  0.98 02/06/2023   CREATININE 1.02 (H) 08/12/2022    Recent Labs  Lab 02/06/23 1637  AST 38  ALT 20  ALKPHOS 60  BILITOT 0.8  PROT 6.2*  ALBUMIN 3.8   Lab Results  Component Value Date   CALCIUM 9.2 02/06/2023    Plt: Lab Results  Component Value Date   PLT 298 02/06/2023    Recent Labs  Lab 02/06/23 1637  WBC 9.7  HGB 12.8  HCT 39.0  MCV 95.4  PLT 298    HG/HCT  stable     Component Value Date/Time   HGB 12.8 02/06/2023 1637   HCT 39.0 02/06/2023 1637   MCV 95.4 02/06/2023 1637    Recent Labs  Lab 02/06/23 1637  LIPASE 23   _________________________________________ Hospitalist was called for admission for   NSTEMI    The following Work up has been ordered so far:  Orders Placed This Encounter  Procedures   Critical Care   DG Chest 2 View   CBC   Comprehensive metabolic panel   Lipase, blood   Brain natriuretic peptide   Heparin level (unfractionated)   Heparin level (unfractionated)   Document Height and Actual Weight   heparin per pharmacy consult   Inpatient consult to Cardiology   Consult for [facility] Admission   EKG 12-Lead   ED EKG     OTHER Significant initial  Findings:  labs showing:     Radiological Exams on Admission: DG Chest 2 View  Result Date: 02/06/2023 CLINICAL DATA:  Chest pain, nausea vomiting, diarrhea EXAM: CHEST - 2 VIEW COMPARISON:  08/12/2022 FINDINGS: Frontal and lateral views of the chest demonstrate mild enlargement of the cardiac silhouette. Stable ectasia and atherosclerosis of the thoracic aorta. Mild central vascular congestion and increased interstitial prominence, which may reflect mild volume overload. No focal consolidation, effusion, or pneumothorax. No acute bony abnormalities. IMPRESSION: 1. Findings consistent with mild congestive heart failure and developing interstitial edema. Electronically Signed   By: Sharlet Salina M.D.   On: 02/06/2023 17:34    _______________________________________________________________________________________________________ Latest  Blood pressure (!) 118/53, pulse 84, temperature 98.4 F (36.9 C), resp. rate 17, height 5\' 2"  (1.575 m), weight 66.2 kg, SpO2 95%.   Vitals  labs and radiology finding personally reviewed  Review of Systems:    Pertinent positives include:   chest pain shortness of breath at rest.  Constitutional:  No weight loss, night sweats, Fevers, chills, fatigue, weight loss  HEENT:  No headaches, Difficulty swallowing,Tooth/dental problems,Sore throat,  No sneezing, itching, ear ache, nasal congestion, post nasal drip,  Cardio-vascular:  No, Orthopnea, PND, anasarca, dizziness, palpitations.no Bilateral lower extremity swelling  GI:  No heartburn, indigestion, abdominal pain, nausea, vomiting, diarrhea, change in bowel habits, loss of appetite, melena, blood in stool, hematemesis Resp:  no  No dyspnea on exertion, No excess mucus, no productive cough, No non-productive cough, No coughing up of blood.No change in color of mucus.No wheezing. Skin:  no rash or lesions. No jaundice GU:  no dysuria, change in color of urine, no urgency or frequency. No straining to urinate.  No flank pain.  Musculoskeletal:  No joint pain or no joint swelling. No decreased range of motion. No back pain.  Psych:  No change in mood or affect. No depression or anxiety. No memory loss.  Neuro: no localizing neurological complaints, no tingling, no weakness, no double vision, no gait abnormality, no slurred speech, no confusion  All systems reviewed and apart from [facility]  all are negative _______________________________________________________________________________________________ Past Medical History:   Past Medical History:  Diagnosis Date   Hypertension    Hypothyroidism       History reviewed. No pertinent surgical history.  Social History:  Ambulatory   walker    reports that she has never  smoked. She has never used smokeless tobacco. She reports that she does not drink alcohol and does not use drugs.   Family History:   History reviewed. No pertinent family history. ______________________________________________________________________________________________ Allergies: Allergies  Allergen Reactions   Codeine Nausea Only    Makes her crazy Hallucinations    Latex Itching   Penicillins Rash    YEAST INFECTION  YEAST INFECTION   Sulfa Antibiotics Anaphylaxis and Other (See Comments)    Heart stops   Thiopental Anaphylaxis and Rash    Other reaction(s): Unknown  CARDIAC ARREST  CARDIAC ARREST     Prior to Admission medications   Medication Sig Start Date End Date Taking? Authorizing Provider  aspirin EC 81 MG tablet Take 81 mg by mouth daily. Swallow whole.    [provider]  Biotin 10 MG CAPS Take 10 mg by mouth daily.    [provider]  Calcium Carb-Cholecalciferol (CALCIUM 600/VITAMIN D PO) Take 1 tablet by mouth daily.    [provider]  furosemide (LASIX) 20 MG tablet Take 20 mg by mouth daily. 09/13/20   [provider]  levothyroxine (SYNTHROID) 50 MCG tablet Take 50 mcg by mouth daily. 09/05/13   [provider]  lisinopril (ZESTRIL) 5 MG tablet Take 5 mg by mouth daily. 11/13/20   [provider]  METAMUCIL FIBER PO Take by mouth at bedtime.    [provider]  metoprolol tartrate (LOPRESSOR) 25 MG tablet Take 25 mg by mouth 2 (two) times daily. 11/30/20   [provider]  Multiple Vitamins-Minerals (THERA-M) TABS Take by mouth.    [provider]  Omega-3 Fatty Acids (FISH OIL) 1000 MG CAPS Take 1,000 mg by mouth daily.    [provider]  omeprazole (PRILOSEC) 20 MG capsule Take 20 mg by mouth daily.    [provider]  oxybutynin (DITROPAN-XL) 5 MG 24 hr tablet Take 5 mg by mouth daily. 07/09/14   [provider]     ___________________________________________________________________________________________________ Physical Exam:    02/06/2023    6:30 PM 02/06/2023    4:03 PM 02/06/2023    3:48 PM  Vitals with BMI  Height  5\' 2"    Weight  145 lbs 15 oz   BMI  26.69   Systolic 118  132  Diastolic 53  61  Pulse 84  86    1. General:  in No  Acute distress   Chronically ill -appearing 2. Psychological: Alert and Oriented 3. Head/ENT:   Dry Mucous Membranes                          Head Non traumatic, neck supple                        Poor Dentition 4. SKIN:decreased Skin turgor,  Skin clean Dry and intact no rash    5. Heart: Regular rate and rhythm no  Murmur, no Rub or gallop 6. Lungs  no wheezes or crackles   7. Abdomen: Soft,  non-tender, Non distended bowel sounds present 8. Lower extremities: no clubbing, cyanosis,  1+ edema 9. Neurologically Grossly intact, moving all  4 extremities equally   10. MSK: Normal range of motion    Chart has been reviewed  ______________________________________________________________________________________________  Assessment/Plan  89 y.o. female with medical history significant of hypertension, NSTEMI, hypothyroidism Admitted for  NSTEMI and acute on chronic systolic CHF  Present on Admission:  NSTEMI (non-ST elevated myocardial infarction) (HCC)  Acute on chronic systolic CHF (congestive heart failure) (HCC)  Essential hypertension  Hypothyroidism     NSTEMI (non-ST elevated myocardial infarction) [facility]) Appreciate cardiology consult Cycle cardiac enzymes Obtain echo Start on heparin Daily aspirin If blood pressure allows would restart Coreg Check lipid panel Start Crestor 20 mg  Acute on chronic systolic CHF (congestive heart failure) (HCC) - Pt diagnosed with CHF based on presence of the following:  rales on exam,  Pulmonary edema on CXR, and   bilateral leg edema,   With noted response to IV diuretic in ER  admit on telemetry,   cycle cardiac enzymes, Troponin 1400    obtain serial ECG  to evaluate for ischemia as a cause of heart failure  monitor daily weight:  Filed Weights   02/06/23 1603  Weight: 66.2 kg    Plan was to  diurese with IV lasix 40 mg IV and monitor orthostatics and creatinine to avoid over diuresis. Unfortunaly it seem spt BP is now very soft, given no O2 requirement will avoid aggressive diuresis for now   Order echogram to evaluate EF and valves  ACE/ARBi if able to tolerate, not tonight   cardiology consulted    Essential hypertension As per cardiology if able to tolerate would attempt to restart carvedilol at 3.125 mg BID   Hypothyroidism - Check TSH continue home medications Synthroid at 50 mcg po q day  Other plan as per orders.  DVT prophylaxis:  heparin  Code Status:  DNR/DNI   as per patient  family  I had personally discussed CODE STATUS with patient and family  ACP  none  Family Communication:   Family at  Bedside  plan of care was discussed  with   Son, Daughter in Social worker,   Diet heart healthy   Disposition Plan:       To home once workup is complete and patient is stable   Following barriers for discharge:                                                         Pain controlled with PO medications                                                         Will need consultants to evaluate patient prior to discharge  Consults called: Cardiology is aware   Admission status:  ED Disposition     ED Disposition  Admit   Condition  --   Comment  Hospital Area: [facility] [[id number]]  Level of Care: Progressive [102]  Admit to Progressive based on following criteria: CARDIOVASCULAR & THORACIC of moderate stability with acute coronary syndrome symptoms/low risk myocardial infarction/hypertensive urgency/arrhythmias/heart failure potentially compromising stability and stable post cardiovascular intervention patients.  May admit patient to [facility] or  [facility]  Long if equivalent level of care is available:: No  Covid Evaluation: Asymptomatic - no recent exposure (last 10 days) testing not required  Diagnosis: NSTEMI (non-ST elevated myocardial infarction) [facility]) [[id number]]  Admitting Physician: Therisa Doyne [[id number]]  Attending Physician: Therisa Doyne [[id number]]  Certification:: I certify this patient will need inpatient services for at least 2 midnights  Expected Medical Readiness: 02/09/2023            inpatient     I Expect 2 midnight stay secondary to severity of patient's current illness need for inpatient interventions justified by the following:   Severe lab/radiological/exam abnormalities including:    NStemi and extensive comorbidities including: NSTEMI  That are currently affecting medical management.   I expect  patient to be hospitalized for 2 midnights requiring inpatient medical care.  Patient is at high risk for adverse outcome (such as loss of life or disability) if not treated.  Indication for inpatient stay as follows:    Need for operative/procedural  intervention     Need for IV heparin IV pain medications     Level of care    progressive     tele indefinitely please discontinue once patient no longer qualifies COVID-19 Labs    Chilton Sallade 02/06/2023, 9:45 PM    [facility]     after 2 AM please page floor coverage PA If 7AM-7PM, please contact the day team taking care of the patient using [email or web address]

## 2023-02-06 NOTE — Assessment & Plan Note (Addendum)
-   Pt diagnosed with CHF based on presence of the following:  rales on exam,  Pulmonary edema on CXR, and   bilateral leg edema,   With noted response to IV diuretic in ER  admit on telemetry,  cycle cardiac enzymes, Troponin 1400    obtain serial ECG  to evaluate for ischemia as a cause of heart failure  monitor daily weight:  Filed Weights   02/06/23 1603  Weight: 66.2 kg    Plan was to  diurese with IV lasix 40 mg IV and monitor orthostatics and creatinine to avoid over diuresis. Unfortunaly it seem spt BP is now very soft, given no O2 requirement will avoid aggressive diuresis for now   Order echogram to evaluate EF and valves  ACE/ARBi if able to tolerate, not tonight   cardiology consulted

## 2023-02-06 NOTE — Subjective & Objective (Signed)
Patient presents for chest pain associated nausea vomiting diarrhea and some shortness of breath History of NSTEMI in the past Chest pain associated when he was eating this morning pain lasted for few hours and slowly resolved blood pressure was up to 170s Associated nausea vomiting similar to when she had her heart attack last month Patient had an admission to [facility] where she had an NSTEMI her troponin maximum was 371 and treated conservatively No cardiac cath was performed at the time At this point chest pain free no associated chest shortness of breath no abdominal pain Patient endorses leg edema but that is chronic Troponin noted to be elevated at 1400

## 2023-02-06 NOTE — Assessment & Plan Note (Signed)
Appreciate cardiology consult Cycle cardiac enzymes Obtain echo Start on heparin Daily aspirin If blood pressure allows would restart Coreg Check lipid panel Start Crestor 20 mg

## 2023-02-06 NOTE — ED Notes (Signed)
Pt transported to xray 

## 2023-02-07 ENCOUNTER — Other Ambulatory Visit (HOSPITAL_COMMUNITY): Payer: Medicare Other

## 2023-02-07 DIAGNOSIS — I214 Non-ST elevation (NSTEMI) myocardial infarction: Secondary | ICD-10-CM | POA: Diagnosis not present

## 2023-02-07 DIAGNOSIS — I10 Essential (primary) hypertension: Secondary | ICD-10-CM

## 2023-02-07 DIAGNOSIS — E039 Hypothyroidism, unspecified: Secondary | ICD-10-CM

## 2023-02-07 DIAGNOSIS — I5023 Acute on chronic systolic (congestive) heart failure: Secondary | ICD-10-CM | POA: Diagnosis not present

## 2023-02-07 LAB — CBC
HCT: 35.2 % — ABNORMAL LOW (ref 36.0–46.0)
Hemoglobin: 11.7 g/dL — ABNORMAL LOW (ref 12.0–15.0)
MCH: 31.5 pg (ref 26.0–34.0)
MCHC: 33.2 g/dL (ref 30.0–36.0)
MCV: 94.9 fL (ref 80.0–100.0)
Platelets: 297 10*3/uL (ref 150–400)
RBC: 3.71 MIL/uL — ABNORMAL LOW (ref 3.87–5.11)
RDW: 15.8 % — ABNORMAL HIGH (ref 11.5–15.5)
WBC: 9.8 10*3/uL (ref 4.0–10.5)
nRBC: 0 % (ref 0.0–0.2)

## 2023-02-07 LAB — COMPREHENSIVE METABOLIC PANEL
ALT: 30 U/L (ref 0–44)
AST: 44 U/L — ABNORMAL HIGH (ref 15–41)
Albumin: 3.2 g/dL — ABNORMAL LOW (ref 3.5–5.0)
Alkaline Phosphatase: 51 U/L (ref 38–126)
Anion gap: 10 (ref 5–15)
BUN: 20 mg/dL (ref 8–23)
CO2: 19 mmol/L — ABNORMAL LOW (ref 22–32)
Calcium: 8.9 mg/dL (ref 8.9–10.3)
Chloride: 107 mmol/L (ref 98–111)
Creatinine, Ser: 0.93 mg/dL (ref 0.44–1.00)
GFR, Estimated: 56 mL/min — ABNORMAL LOW (ref >60)
Glucose, Bld: 173 mg/dL — ABNORMAL HIGH (ref 70–99)
Potassium: 4 mmol/L (ref 3.5–5.1)
Sodium: 136 mmol/L (ref 135–145)
Total Bilirubin: 0.6 mg/dL (ref <1.2)
Total Protein: 5.5 g/dL — ABNORMAL LOW (ref 6.5–8.1)

## 2023-02-07 LAB — CBC WITH DIFFERENTIAL/PLATELET
Abs Immature Granulocytes: 0.05 10*3/uL (ref 0.00–0.07)
Basophils Absolute: 0 10*3/uL (ref 0.0–0.1)
Basophils Relative: 0 %
Eosinophils Absolute: 0 10*3/uL (ref 0.0–0.5)
Eosinophils Relative: 0 %
HCT: 34.6 % — ABNORMAL LOW (ref 36.0–46.0)
Hemoglobin: 11.5 g/dL — ABNORMAL LOW (ref 12.0–15.0)
Immature Granulocytes: 1 %
Lymphocytes Relative: 14 %
Lymphs Abs: 1.2 10*3/uL (ref 0.7–4.0)
MCH: 31.3 pg (ref 26.0–34.0)
MCHC: 33.2 g/dL (ref 30.0–36.0)
MCV: 94.3 fL (ref 80.0–100.0)
Monocytes Absolute: 1 10*3/uL (ref 0.1–1.0)
Monocytes Relative: 11 %
Neutro Abs: 6.5 10*3/uL (ref 1.7–7.7)
Neutrophils Relative %: 74 %
Platelets: 291 10*3/uL (ref 150–400)
RBC: 3.67 MIL/uL — ABNORMAL LOW (ref 3.87–5.11)
RDW: 15.8 % — ABNORMAL HIGH (ref 11.5–15.5)
WBC: 8.8 10*3/uL (ref 4.0–10.5)
nRBC: 0 % (ref 0.0–0.2)

## 2023-02-07 LAB — HEMOGLOBIN A1C
Hgb A1c MFr Bld: 5.5 % (ref 4.8–5.6)
Mean Plasma Glucose: 111.15 mg/dL

## 2023-02-07 LAB — PREALBUMIN: Prealbumin: 22 mg/dL (ref 18–38)

## 2023-02-07 LAB — HEPARIN LEVEL (UNFRACTIONATED)
Heparin Unfractionated: 0.11 [IU]/mL — ABNORMAL LOW (ref 0.30–0.70)
Heparin Unfractionated: 0.19 [IU]/mL — ABNORMAL LOW (ref 0.30–0.70)
Heparin Unfractionated: 0.37 [IU]/mL (ref 0.30–0.70)

## 2023-02-07 LAB — TROPONIN I (HIGH SENSITIVITY)
Troponin I (High Sensitivity): 2411 ng/L (ref <18)
Troponin I (High Sensitivity): 2148 ng/L (ref <18)
Troponin I (High Sensitivity): 2020 ng/L (ref <18)

## 2023-02-07 MED ORDER — ONDANSETRON HCL 4 MG/2ML IJ SOLN: 4.0000 mg | Freq: Four times a day (QID) | INTRAMUSCULAR | Status: DC | PRN

## 2023-02-07 MED ORDER — ACETAMINOPHEN 325 MG PO TABS: 650.0000 mg | ORAL_TABLET | ORAL | Status: DC | PRN

## 2023-02-07 MED ORDER — ROSUVASTATIN CALCIUM 20 MG PO TABS
20.0000 mg | ORAL_TABLET | Freq: Every day | ORAL | Status: DC
  Administered 2023-02-07 – 2023-02-09 (×3): 20 mg via ORAL
  Filled 2023-02-07 (×3): qty 1

## 2023-02-07 MED ORDER — LEVOTHYROXINE SODIUM 50 MCG PO TABS
50.0000 ug | ORAL_TABLET | Freq: Every day | ORAL | Status: DC
  Administered 2023-02-07 – 2023-02-09 (×3): 50 ug via ORAL
  Filled 2023-02-07 (×3): qty 1

## 2023-02-07 MED ORDER — MELATONIN 5 MG PO TABS
5.0000 mg | ORAL_TABLET | Freq: Every evening | ORAL | Status: DC | PRN
  Administered 2023-02-07 – 2023-02-08 (×2): 5 mg via ORAL
  Filled 2023-02-07 (×2): qty 1

## 2023-02-07 MED ORDER — ASPIRIN 81 MG PO TBEC
81.0000 mg | DELAYED_RELEASE_TABLET | Freq: Every day | ORAL | Status: DC
  Administered 2023-02-07 – 2023-02-09 (×3): 81 mg via ORAL
  Filled 2023-02-07 (×3): qty 1

## 2023-02-07 MED ORDER — ALPRAZOLAM 0.25 MG PO TABS
0.2500 mg | ORAL_TABLET | Freq: Two times a day (BID) | ORAL | Status: DC | PRN
  Administered 2023-02-07 – 2023-02-08 (×2): 0.25 mg via ORAL
  Filled 2023-02-07 (×2): qty 1

## 2023-02-07 MED ORDER — FUROSEMIDE 10 MG/ML IJ SOLN
40.0000 mg | Freq: Once | INTRAMUSCULAR | Status: AC
  Administered 2023-02-07: 40 mg via INTRAVENOUS
  Filled 2023-02-07: qty 4

## 2023-02-07 MED ORDER — SODIUM CHLORIDE 0.9% FLUSH
3.0000 mL | Freq: Two times a day (BID) | INTRAVENOUS | Status: DC
Start: 2023-02-07 — End: 2023-02-09
  Administered 2023-02-07 – 2023-02-08 (×3): 3 mL via INTRAVENOUS

## 2023-02-07 MED ORDER — SODIUM CHLORIDE 0.9 % IV SOLN: 250.0000 mL | INTRAVENOUS | Status: AC | PRN

## 2023-02-07 MED ORDER — CARVEDILOL 3.125 MG PO TABS
3.1250 mg | ORAL_TABLET | Freq: Two times a day (BID) | ORAL | Status: DC
  Administered 2023-02-07: 3.125 mg via ORAL
  Filled 2023-02-07: qty 1

## 2023-02-07 MED ORDER — CARVEDILOL 6.25 MG PO TABS
6.2500 mg | ORAL_TABLET | Freq: Two times a day (BID) | ORAL | Status: DC
  Administered 2023-02-07 – 2023-02-09 (×4): 6.25 mg via ORAL
  Filled 2023-02-07 (×4): qty 1

## 2023-02-07 MED ORDER — ENSURE ENLIVE PO LIQD
237.0000 mL | Freq: Two times a day (BID) | ORAL | Status: DC
  Administered 2023-02-07 – 2023-02-08 (×2): 237 mL via ORAL

## 2023-02-07 MED ORDER — SODIUM CHLORIDE 0.9% FLUSH
3.0000 mL | INTRAVENOUS | Status: DC | PRN
  Administered 2023-02-07: 3 mL via INTRAVENOUS

## 2023-02-07 NOTE — Progress Notes (Signed)
Rounding Note    Patient Name: Vicki [name] Date of Encounter: 02/07/2023  [facility] Cardiologist: None new  Subjective   No more chest pain, none since admit Son and Dtr-in-law are [facility] but she says this is in her will. Says her doctor had the paper but lost it.   Says that her son initially wanted procedures, but talked to one of the doctors and then said no procedures. That is what she wants. Medical management.  Inpatient Medications    Scheduled Meds:  aspirin EC  81 mg Oral Daily   carvedilol  3.125 mg Oral BID WC   feeding supplement  237 mL Oral BID BM   levothyroxine  50 mcg Oral Q0600   rosuvastatin  20 mg Oral Daily   sodium chloride flush  3 mL Intravenous Q12H   Continuous Infusions:  sodium chloride     heparin 950 Units/hr (02/07/23 0403)   PRN Meds: sodium chloride, acetaminophen, ALPRAZolam, ondansetron (ZOFRAN) IV, sodium chloride flush   Vital Signs    Vitals:   02/07/23 0030 02/07/23 0130 02/07/23 0200 02/07/23 0400  BP: (!) 110/57 104/75 112/85 (!) 112/58  Pulse: 86 83 83 86  Resp: 20 18 16 20   Temp: 98.4 F (36.9 C)   98 F (36.7 C)  TempSrc: Oral   Oral  SpO2: 97% 96% 96% 95%  Weight:    65.7 kg  Height:    5\' 2"  (1.575 m)    Intake/Output Summary (Last 24 hours) at 02/07/2023 1005 Last data filed at 02/07/2023 1610 Gross per 24 hour  Intake 250 ml  Output 200 ml  Net 50 ml      02/07/2023    4:00 AM 02/06/2023    8:16 PM 02/06/2023    4:03 PM  Last 3 Weights  Weight (lbs) 144 lb 14.4 oz 145 lb 15.1 oz 145 lb 15.1 oz  Weight (kg) 65.726 kg 66.2 kg 66.2 kg      Telemetry    SR, ST, PVCs and pairs - Personally Reviewed  ECG    None today  - Personally Reviewed  Physical Exam   GEN: No acute distress.   Neck:  JVD 10 cm Cardiac: RRR, no murmurs, rubs, or gallops.  Respiratory: rales bases bilaterally. GI: Soft, nontender, non-distended  MS: No edema; No deformity. Neuro:  Nonfocal  Psych: Normal  affect   Labs    High Sensitivity Troponin:   Recent Labs  Lab 02/06/23 1637 02/06/23 1808 02/06/23 2128 02/07/23 0258 02/07/23 0834  TROPONINIHS 1,424* 1,724* 2,356* 2,411* 2,148*     Chemistry Recent Labs  Lab 02/06/23 1637 02/06/23 2128 02/07/23 0834  NA 135  --  136  K 3.9  --  4.0  CL 104  --  107  CO2 19*  --  19*  GLUCOSE 144*  --  173*  BUN 24*  --  20  CREATININE 0.98  --  0.93  CALCIUM 9.2  --  8.9  MG  --  2.2  --   PROT 6.2*  --  5.5*  ALBUMIN 3.8  --  3.2*  AST 38  --  44*  ALT 20  --  30  ALKPHOS 60  --  51  BILITOT 0.8  --  0.6  GFRNONAA 52*  --  56*  ANIONGAP 12  --  10    Lipids No results for input(s): "CHOL", "TRIG", "HDL", "LABVLDL", "LDLCALC", "CHOLHDL" in the last 168 hours.  Hematology Recent  Labs  Lab 02/06/23 1637 02/07/23 0834  WBC 9.7 8.8  RBC 4.09 3.67*  HGB 12.8 11.5*  HCT 39.0 34.6*  MCV 95.4 94.3  MCH 31.3 31.3  MCHC 32.8 33.2  RDW 15.5 15.8*  PLT 298 291   Thyroid  Recent Labs  Lab 02/06/23 2128  TSH 1.176  FREET4 1.92*    BNP Recent Labs  Lab 02/06/23 1637  BNP 1,546.6*    DDimer No results for input(s): "DDIMER" in the last 168 hours.   Radiology    DG Chest 2 View  Result Date: 02/06/2023 CLINICAL DATA:  Chest pain, nausea vomiting, diarrhea EXAM: CHEST - 2 VIEW COMPARISON:  08/12/2022 FINDINGS: Frontal and lateral views of the chest demonstrate mild enlargement of the cardiac silhouette. Stable ectasia and atherosclerosis of the thoracic aorta. Mild central vascular congestion and increased interstitial prominence, which may reflect mild volume overload. No focal consolidation, effusion, or pneumothorax. No acute bony abnormalities. IMPRESSION: 1. Findings consistent with mild congestive heart failure and developing interstitial edema. Electronically Signed   By: Sharlet Salina M.D.   On: 02/06/2023 17:34    Cardiac Studies   ECHO: Scheduled  ECHO: from Care Everywhere, 01/04/2023 Left Ventricle: Left  ventricle is moderately dilated.    Left Ventricle: EF: 40-45%. Quantitative analysis of left ventricular Global Longitudinal Strain (GLS) imaging is -5.400%, which is abnormal. Ejection fraction measured by 3D is 44%, which is abnormal.    Left Ventricle: There is mild  hypokinesis of the left ventricle.    Left Atrium: Left atrium volume index is severely increased (>48 mL/m2).   Aortic Valve: The aortic valve is tricuspid. The leaflets are severely calcified.   Aortic Valve: There is no regurgitation or stenosis.    Mitral Valve: There is moderate annular calcification.    Mitral Valve: There is mild regurgitation.    Tricuspid Valve: Tricuspid valve structure is normal.    Tricuspid Valve: The right ventricular systolic pressure is normal (<36 mmHg).   Pericardium: There is no pericardial effusion.    Patient Profile     89 y.o. female with a hx of HTN, HLD, hypothyroidism, recent NSTEMI medically managed and newly diagnosed systolic heart failure with mildly reduced LVEF (40-45%), was admitted 12/07 with NSTEMI.   Assessment & Plan    NSTEMI - pt has discussed this with her son, she prefers medical management. - on heparin, probably can stop after 48 hr - continue ASA 81 MG, Coreg 3.125 mg bid, Crestor 20 mg  - feel we can increase the Coreg >> 6.25 mg bid - f/u on echo results but BP may limit initiation of GDMT  2. CHF - mild systolic dysfunction on echo 1 month ago - recheck echo pending - will give 1 dose Lasix 40 mg IV, she has not had any since admit. - follow closely w/ diuresis  Otherwise, per IM    For questions or updates, please contact [facility] Please consult [email or web address] for contact info under        Signed, Theodore Demark, PA-C  02/07/2023, 10:05 AM

## 2023-02-07 NOTE — Progress Notes (Signed)
ANTICOAGULATION CONSULT NOTE - Follow-Up Consult  Pharmacy Consult for Heparin Indication: chest pain/ACS  Patient Measurements: Height: 5\' 2"  (157.5 cm) Weight: 65.7 kg (144 lb 14.4 oz) IBW/kg (Calculated) : 50.1 Heparin Dosing Weight: 63.7 kg  Vital Signs: Temp: 97.5 F (36.4 C) (12/08 2002) Temp Source: Axillary (12/08 2002) BP: 112/56 (12/08 2002) Pulse Rate: 75 (12/08 2002)  Labs: Recent Labs    02/06/23 1637 02/06/23 1808 02/06/23 2128 02/07/23 0258 02/07/23 0834 02/07/23 1025 02/07/23 1140 02/07/23 1954  HGB 12.8  --   --   --  11.5*  --  11.7*  --   HCT 39.0  --   --   --  34.6*  --  35.2*  --   PLT 298  --   --   --  291  --  297  --   HEPARINUNFRC  --   --   --  0.11*  --   --  0.19* 0.37  CREATININE 0.98  --   --   --  0.93  --   --   --   CKTOTAL  --   --  105  --   --   --   --   --   TROPONINIHS 1,424*   < > 2,356* 2,411* 2,148* 2,020*  --   --    < > = values in this interval not displayed.    Estimated Creatinine Clearance: 30.7 mL/min (by C-G formula based on SCr of 0.93 mg/dL).   Medical History: Past Medical History:  Diagnosis Date   Hypertension    Hypothyroidism     Assessment: 89 yo F with a history of HTN, HLD, NSTEMI, and hypothyroidism. Patient is presenting with chest pain. Heparin per pharmacy consult placed.  Plan for medical management with heparin x 48 hours.  Heparin level is therapeutic this PM. Cont current rate.   Goal of Therapy:  Heparin level 0.3-0.7 units/ml Monitor platelets by anticoagulation protocol: Yes   Plan:  Cont heparin 1100 units/hr Continue daily heparin levels and CBCs Follow-up 48 hours stop time for heparin (12/9 ~ 7pm)  .Ulyses Southward, PharmD, BCIDP, AAHIVP, CPP Infectious Disease Pharmacist 02/07/2023 8:27 PM

## 2023-02-07 NOTE — Progress Notes (Signed)
PROGRESS NOTE    Vicki [name]  XBM:[id number] DOB: 02-11-1924 DOA: 02/06/2023 PCP: Deloris Ping, MD   Brief Narrative: Vicki [name] is a 89 y.o. female with a history of hypertension, NSTEMI, hypothyroidism, heart failure.  Patient presented secondary to chest pain with evidence of NSTEMI. Cardiology consulted. Troponin peak of 2,411. Cardiology recommendation for [facility], however patient prefers to pursue medical management. Diuresis initiated for acute on chronic heart failure.   Assessment and Plan:  NSTEMI Patient with initial chest pain and troponin elevation with peak of 2,411. Cardiology consulted with initial plan to consider LHC. Patient declining intervention at this time and prefers medical management. Heparin IV -Cardiology recommendations: repeat Transthoracic Echocardiogram with possible reconsideration of LHC, Heparin IV x48 hours  Acute on chronic systolic heart failure No diuretic given on admission. Cardiology gave Lasix 40 mg IV x1. -Strict in/out and daily weights  Primary hypertension -Continue Coreg  Hyperlipidemia -Continue Crestor  Hypothyroidism -Continue Synthroid   DVT prophylaxis: Heparin IV Code Status:   Code Status: Limited: Do not attempt resuscitation (DNR) -DNR-LIMITED -Do Not Intubate/DNI  Family Communication: None at bedside Disposition Plan: Discharge home pending cardiology recommendations   Consultants:  Cardiology  Procedures:  None  Antimicrobials: None    Subjective: Patient reports no chest pain or dyspnea. Wants to discharge.  Objective: BP (!) 112/58 (BP Location: Right Arm)   Pulse 86   Temp 98 F (36.7 C) (Oral)   Resp 20   Ht 5\' 2"  (1.575 m)   Wt 65.7 kg   SpO2 95%   BMI 26.50 kg/m   Examination:  General exam: Appears calm and comfortable Respiratory system: Clear to auscultation. Respiratory effort normal. Cardiovascular system: S1 & S2 heard, Normal rate with regular  rhythm. Gastrointestinal system: Abdomen is nondistended, soft and nontender. Normal bowel sounds heard. Central nervous system: Alert and oriented. No focal neurological deficits. Musculoskeletal: 2+ BLE pitting edema. No calf tenderness Skin: No cyanosis. No rashes Psychiatry: Judgement and insight appear normal. Mood & affect appropriate.    Data Reviewed: I have personally reviewed following labs and imaging studies  CBC Lab Results  Component Value Date   WBC 8.8 02/07/2023   RBC 3.67 (L) 02/07/2023   HGB 11.5 (L) 02/07/2023   HCT 34.6 (L) 02/07/2023   MCV 94.3 02/07/2023   MCH 31.3 02/07/2023   PLT 291 02/07/2023   MCHC 33.2 02/07/2023   RDW 15.8 (H) 02/07/2023   LYMPHSABS 1.2 02/07/2023   MONOABS 1.0 02/07/2023   EOSABS 0.0 02/07/2023   BASOSABS 0.0 02/07/2023     Last metabolic panel Lab Results  Component Value Date   NA 135 02/06/2023   K 3.9 02/06/2023   CL 104 02/06/2023   CO2 19 (L) 02/06/2023   BUN 24 (H) 02/06/2023   CREATININE 0.98 02/06/2023   GLUCOSE 144 (H) 02/06/2023   GFRNONAA 52 (L) 02/06/2023   CALCIUM 9.2 02/06/2023   PHOS 3.4 02/06/2023   PROT 6.2 (L) 02/06/2023   ALBUMIN 3.8 02/06/2023   BILITOT 0.8 02/06/2023   ALKPHOS 60 02/06/2023   AST 38 02/06/2023   ALT 20 02/06/2023   ANIONGAP 12 02/06/2023    GFR: Estimated Creatinine Clearance: 29.2 mL/min (by C-G formula based on SCr of 0.98 mg/dL).  No results found for this or any previous visit (from the past 240 hour(s)).    Radiology Studies: DG Chest 2 View  Result Date: 02/06/2023 CLINICAL DATA:  Chest pain, nausea vomiting, diarrhea EXAM: CHEST -  2 VIEW COMPARISON:  08/12/2022 FINDINGS: Frontal and lateral views of the chest demonstrate mild enlargement of the cardiac silhouette. Stable ectasia and atherosclerosis of the thoracic aorta. Mild central vascular congestion and increased interstitial prominence, which may reflect mild volume overload. No focal consolidation, effusion,  or pneumothorax. No acute bony abnormalities. IMPRESSION: 1. Findings consistent with mild congestive heart failure and developing interstitial edema. Electronically Signed   By: Sharlet Salina M.D.   On: 02/06/2023 17:34      LOS: 1 day    Jacquelin Hawking, MD [facility] 02/07/2023, 8:59 AM   If 7PM-7AM, please contact night-coverage [email or web address]

## 2023-02-07 NOTE — Progress Notes (Signed)
Lab notified me that another tropomom has been enter and they just drew one. NETTEY MD notified me to discontinue it. Will continue to monitor patient.

## 2023-02-07 NOTE — Progress Notes (Signed)
ANTICOAGULATION CONSULT NOTE - Initial Consult  Pharmacy Consult for Heparin Indication: chest pain/ACS  Patient Measurements: Height: 5\' 2"  (157.5 cm) Weight: 66.2 kg (145 lb 15.1 oz) IBW/kg (Calculated) : 50.1 Heparin Dosing Weight: 63.7 kg  Vital Signs: Temp: 98.4 F (36.9 C) (12/08 0030) Temp Source: Oral (12/08 0030) BP: 112/85 (12/08 0200) Pulse Rate: 83 (12/08 0200)  Labs: Recent Labs    02/06/23 1637 02/06/23 1808 02/06/23 2128 02/07/23 0258  HGB 12.8  --   --   --   HCT 39.0  --   --   --   PLT 298  --   --   --   HEPARINUNFRC  --   --   --  0.11*  CREATININE 0.98  --   --   --   CKTOTAL  --   --  105  --   TROPONINIHS 1,424* 1,724* 2,356*  --     Estimated Creatinine Clearance: 29.3 mL/min (by C-G formula based on SCr of 0.98 mg/dL).   Medical History: Past Medical History:  Diagnosis Date   Hypertension    Hypothyroidism     Assessment: 84 yof with a history of HTN, STEMI, and hypothyroidism. Patient is presenting with chest pain. Heparin per pharmacy consult placed for chest pain/ACS.  Patient is not on anticoagulation prior to arrival. Hgb 12.8; plt 298 hsTrop 1424  12/8 AM: heparin level 0.11 on 750 units/hr (subtherapeutic). Per RN, no issues with the heparin infusion running continuously or signs/symptoms of bleeding. Last CBC WNL   Goal of Therapy:  Heparin level 0.3-0.7 units/ml Monitor platelets by anticoagulation protocol: Yes   Plan:  Increase heparin infusion to 950 units/hr Check anti-Xa level in 8 hours and daily while on heparin Continue to monitor H&H and platelets  Arabella Merles, PharmD. Clinical Pharmacist 02/07/2023 3:58 AM

## 2023-02-07 NOTE — Plan of Care (Signed)
Will continue to monitor patient.

## 2023-02-07 NOTE — Hospital Course (Signed)
Vicki [name] is a 89 y.o. female with a history of hypertension, NSTEMI, hypothyroidism, heart failure.  Patient presented secondary to chest pain with evidence of NSTEMI. Cardiology consulted. Troponin peak of 2,411. Cardiology recommendation for [facility], however patient prefers to pursue medical management. Diuresis initiated for acute on chronic heart failure.

## 2023-02-07 NOTE — Progress Notes (Signed)
ANTICOAGULATION CONSULT NOTE - Follow-Up Consult  Pharmacy Consult for Heparin Indication: chest pain/ACS  Patient Measurements: Height: 5\' 2"  (157.5 cm) Weight: 65.7 kg (144 lb 14.4 oz) IBW/kg (Calculated) : 50.1 Heparin Dosing Weight: 63.7 kg  Vital Signs: Temp: 98.8 F (37.1 C) (12/08 1038) Temp Source: Oral (12/08 1038) BP: 122/58 (12/08 1038) Pulse Rate: 89 (12/08 1038)  Labs: Recent Labs    02/06/23 1637 02/06/23 1808 02/06/23 2128 02/07/23 0258 02/07/23 0834 02/07/23 1025 02/07/23 1140  HGB 12.8  --   --   --  11.5*  --  11.7*  HCT 39.0  --   --   --  34.6*  --  35.2*  PLT 298  --   --   --  291  --  297  HEPARINUNFRC  --   --   --  0.11*  --   --  0.19*  CREATININE 0.98  --   --   --  0.93  --   --   CKTOTAL  --   --  105  --   --   --   --   TROPONINIHS 1,424*   < > 2,356* 2,411* 2,148* 2,020*  --    < > = values in this interval not displayed.    Estimated Creatinine Clearance: 30.7 mL/min (by C-G formula based on SCr of 0.93 mg/dL).   Medical History: Past Medical History:  Diagnosis Date   Hypertension    Hypothyroidism     Assessment: 89 yo F with a history of HTN, HLD, NSTEMI, and hypothyroidism. Patient is presenting with chest pain. Heparin per pharmacy consult placed.  Plan for medical management with heparin x 48 hours.  Heparin level 0.19 on heparin at 950 units/hr.  No bleeding noted.  Goal of Therapy:  Heparin level 0.3-0.7 units/ml Monitor platelets by anticoagulation protocol: Yes   Plan:  Increase heparin infusion to 1100 units/hr Check heparin level in 8 hours Continue daily heparin levels and CBCs Follow-up 48 hours stop time for heparin (12/9 ~ 7pm)  Dixie Dials, Pharm.D., BCPS Clinical Pharmacist  **Pharmacist phone directory can be found on [email or web address] listed under [facility].  02/07/2023 1:22 PM

## 2023-02-08 ENCOUNTER — Inpatient Hospital Stay (HOSPITAL_COMMUNITY): Payer: Medicare Other

## 2023-02-08 DIAGNOSIS — I214 Non-ST elevation (NSTEMI) myocardial infarction: Secondary | ICD-10-CM | POA: Diagnosis not present

## 2023-02-08 DIAGNOSIS — I10 Essential (primary) hypertension: Secondary | ICD-10-CM | POA: Diagnosis not present

## 2023-02-08 DIAGNOSIS — I5021 Acute systolic (congestive) heart failure: Secondary | ICD-10-CM | POA: Diagnosis not present

## 2023-02-08 DIAGNOSIS — E039 Hypothyroidism, unspecified: Secondary | ICD-10-CM | POA: Diagnosis not present

## 2023-02-08 DIAGNOSIS — I5023 Acute on chronic systolic (congestive) heart failure: Secondary | ICD-10-CM | POA: Diagnosis not present

## 2023-02-08 LAB — CBC
HCT: 33.1 % — ABNORMAL LOW (ref 36.0–46.0)
Hemoglobin: 10.8 g/dL — ABNORMAL LOW (ref 12.0–15.0)
MCH: 31.3 pg (ref 26.0–34.0)
MCHC: 32.6 g/dL (ref 30.0–36.0)
MCV: 95.9 fL (ref 80.0–100.0)
Platelets: 257 10*3/uL (ref 150–400)
RBC: 3.45 MIL/uL — ABNORMAL LOW (ref 3.87–5.11)
RDW: 15.9 % — ABNORMAL HIGH (ref 11.5–15.5)
WBC: 7.9 10*3/uL (ref 4.0–10.5)
nRBC: 0 % (ref 0.0–0.2)

## 2023-02-08 LAB — ECHOCARDIOGRAM COMPLETE
AR max vel: 1.87 cm2
AV Area VTI: 1.7 cm2
AV Area mean vel: 1.6 cm2
AV Mean grad: 4 mm[Hg]
AV Peak grad: 8 mm[Hg]
Ao pk vel: 1.41 m/s
Area-P 1/2: 4.1 cm2
Calc EF: 45.2 %
Height: 62 in
MV M vel: 5.02 m/s
MV Peak grad: 100.9 mm[Hg]
MV VTI: 1.47 cm2
Radius: 0.3 cm
S' Lateral: 4.2 cm
Single Plane A2C EF: 45 %
Single Plane A4C EF: 47.1 %
Weight: 2280 [oz_av]

## 2023-02-08 LAB — BASIC METABOLIC PANEL
Anion gap: 11 (ref 5–15)
BUN: 23 mg/dL (ref 8–23)
CO2: 20 mmol/L — ABNORMAL LOW (ref 22–32)
Calcium: 8.8 mg/dL — ABNORMAL LOW (ref 8.9–10.3)
Chloride: 104 mmol/L (ref 98–111)
Creatinine, Ser: 0.79 mg/dL (ref 0.44–1.00)
GFR, Estimated: >60 mL/min (ref >60)
Glucose, Bld: 111 mg/dL — ABNORMAL HIGH (ref 70–99)
Potassium: 3.6 mmol/L (ref 3.5–5.1)
Sodium: 135 mmol/L (ref 135–145)

## 2023-02-08 LAB — HEPARIN LEVEL (UNFRACTIONATED): Heparin Unfractionated: 0.43 [IU]/mL (ref 0.30–0.70)

## 2023-02-08 LAB — T3: T3, Total: 57 ng/dL — ABNORMAL LOW (ref 71–180)

## 2023-02-08 LAB — MAGNESIUM: Magnesium: 2 mg/dL (ref 1.7–2.4)

## 2023-02-08 MED ORDER — PANTOPRAZOLE SODIUM 40 MG PO TBEC
40.0000 mg | DELAYED_RELEASE_TABLET | Freq: Every day | ORAL | Status: DC
  Administered 2023-02-08 – 2023-02-09 (×2): 40 mg via ORAL
  Filled 2023-02-08 (×2): qty 1

## 2023-02-08 MED ORDER — LOSARTAN POTASSIUM 25 MG PO TABS
12.5000 mg | ORAL_TABLET | Freq: Every day | ORAL | Status: DC
  Administered 2023-02-08 – 2023-02-09 (×2): 12.5 mg via ORAL
  Filled 2023-02-08 (×2): qty 1

## 2023-02-08 MED ORDER — ADULT MULTIVITAMIN W/MINERALS CH
1.0000 | ORAL_TABLET | Freq: Every day | ORAL | Status: DC
  Administered 2023-02-08 – 2023-02-09 (×2): 1 via ORAL
  Filled 2023-02-08 (×2): qty 1

## 2023-02-08 MED ORDER — FUROSEMIDE 20 MG PO TABS
20.0000 mg | ORAL_TABLET | Freq: Every day | ORAL | Status: DC
  Administered 2023-02-08 – 2023-02-09 (×2): 20 mg via ORAL
  Filled 2023-02-08 (×2): qty 1

## 2023-02-08 MED ORDER — PSYLLIUM 95 % PO PACK
1.0000 | PACK | Freq: Every day | ORAL | Status: DC
  Administered 2023-02-08: 1 via ORAL
  Filled 2023-02-08 (×2): qty 1

## 2023-02-08 MED ORDER — OMEGA-3-ACID ETHYL ESTERS 1 G PO CAPS
1.0000 g | ORAL_CAPSULE | Freq: Every day | ORAL | Status: DC
  Administered 2023-02-08 – 2023-02-09 (×2): 1 g via ORAL
  Filled 2023-02-08 (×2): qty 1

## 2023-02-08 NOTE — Progress Notes (Signed)
Transition of Care [facility]) - Inpatient Brief Assessment   Patient Details  Name: Vicki [name] MRN: [id number] Date of Birth: June 18, 1923  Transition of Care [facility] CM/SW Contact:    Ronny Bacon, RN Phone Number: 02/08/2023, 11:27 AM   Clinical Narrative: Patient from home with MI. Patient and family refused Cath at this time. Plan per Cardiology is IV heparin x 48 hours, ending @ 1900 and Echo. Depending on results, pt may be discharged home. Family is available to transport patient home at discharge.   Transition of Care Asessment: Insurance and Status: (P) Insurance coverage has been reviewed Patient has primary care physician: (P) Yes Home environment has been reviewed: (P) House Prior level of function:: (P) Independent Prior/Current Home Services: (P) No current home services (Used [facility] in past, last date of service 06/2022) Social Determinants of Health Reivew: (P) SDOH reviewed no interventions necessary Readmission risk has been reviewed: (P) Yes Transition of care needs: (P) no transition of care needs at this time

## 2023-02-08 NOTE — Progress Notes (Signed)
  Echocardiogram 2D Echocardiogram has been performed.  Ocie Doyne RDCS 02/08/2023, 1:22 PM

## 2023-02-08 NOTE — Progress Notes (Signed)
Mobility Specialist Progress Note:    02/08/23 1600  Mobility  Activity Transferred from bed to chair  Level of Assistance Contact guard assist, steadying assist  Assistive Device Four wheel walker  Distance Ambulated (ft) 4 ft  Activity Response Tolerated well  Mobility Referral Yes  Mobility visit 1 Mobility  Mobility Specialist Start Time (ACUTE ONLY) 1531  Mobility Specialist Stop Time (ACUTE ONLY) 1556  Mobility Specialist Time Calculation (min) (ACUTE ONLY) 25 min   Pt received in bed, agreeable to mobility w/ encouragement. Nursing requesting pt get up to chair. Asymptomatic throughout transfer. At EOS pt stated she would only sit up in chair for 20 minutes because she doesn't like it. Pt left in chair with call bell and all needs met. Chair alarm on.  D'Vante Earlene Plater Mobility Specialist Please contact via Special educational needs teacher or Rehab office at [phone]

## 2023-02-08 NOTE — Progress Notes (Signed)
Rounding Note    Patient Name: Vicki [name] Date of Encounter: 02/08/2023  [facility] Cardiologist: None   Subjective   Denies any CP or SOB. Son will come today and bring with him Living Will and HPOA  Inpatient Medications    Scheduled Meds:  aspirin EC  81 mg Oral Daily   carvedilol  6.25 mg Oral BID WC   feeding supplement  237 mL Oral BID BM   levothyroxine  50 mcg Oral Q0600   rosuvastatin  20 mg Oral Daily   sodium chloride flush  3 mL Intravenous Q12H   Continuous Infusions:  heparin 1,100 Units/hr (02/07/23 2034)   PRN Meds: acetaminophen, ALPRAZolam, melatonin, ondansetron (ZOFRAN) IV, sodium chloride flush   Vital Signs    Vitals:   02/07/23 2002 02/08/23 0352 02/08/23 0452 02/08/23 0741  BP: (!) 112/56 (!) 103/49  (!) 124/54  Pulse: 75   75  Resp: 16 20  15   Temp: (!) 97.5 F (36.4 C) 97.7 F (36.5 C)  98.4 F (36.9 C)  TempSrc: Axillary Oral  Oral  SpO2: 98% 98%  92%  Weight:   64.6 kg   Height:        Intake/Output Summary (Last 24 hours) at 02/08/2023 0817 Last data filed at 02/08/2023 4098 Gross per 24 hour  Intake 1424.23 ml  Output 1800 ml  Net -375.77 ml      02/08/2023    4:52 AM 02/07/2023    4:00 AM 02/06/2023    8:16 PM  Last 3 Weights  Weight (lbs) 142 lb 8 oz 144 lb 14.4 oz 145 lb 15.1 oz  Weight (kg) 64.638 kg 65.726 kg 66.2 kg      Telemetry    NSR with frequent PVCs - Personally Reviewed  ECG    NSR with LBBB, PVCs - Personally Reviewed  Physical Exam   GEN: No acute distress.   Neck: No JVD Cardiac: RRR, no murmurs, rubs, or gallops.  Respiratory: Clear to auscultation bilaterally. GI: Soft, nontender, non-distended  MS: No edema; No deformity. Neuro:  Nonfocal  Psych: Normal affect   Labs    High Sensitivity Troponin:   Recent Labs  Lab 02/06/23 1808 02/06/23 2128 02/07/23 0258 02/07/23 0834 02/07/23 1025  TROPONINIHS 1,724* 2,356* 2,411* 2,148* 2,020*     Chemistry Recent Labs   Lab 02/06/23 1637 02/06/23 2128 02/07/23 0834  NA 135  --  136  K 3.9  --  4.0  CL 104  --  107  CO2 19*  --  19*  GLUCOSE 144*  --  173*  BUN 24*  --  20  CREATININE 0.98  --  0.93  CALCIUM 9.2  --  8.9  MG  --  2.2  --   PROT 6.2*  --  5.5*  ALBUMIN 3.8  --  3.2*  AST 38  --  44*  ALT 20  --  30  ALKPHOS 60  --  51  BILITOT 0.8  --  0.6  GFRNONAA 52*  --  56*  ANIONGAP 12  --  10    Lipids No results for input(s): "CHOL", "TRIG", "HDL", "LABVLDL", "LDLCALC", "CHOLHDL" in the last 168 hours.  Hematology Recent Labs  Lab 02/07/23 0834 02/07/23 1140 02/08/23 0348  WBC 8.8 9.8 7.9  RBC 3.67* 3.71* 3.45*  HGB 11.5* 11.7* 10.8*  HCT 34.6* 35.2* 33.1*  MCV 94.3 94.9 95.9  MCH 31.3 31.5 31.3  MCHC 33.2 33.2 32.6  RDW  15.8* 15.8* 15.9*  PLT 291 297 257   Thyroid  Recent Labs  Lab 02/06/23 2128  TSH 1.176  FREET4 1.92*    BNP Recent Labs  Lab 02/06/23 1637  BNP 1,546.6*    DDimer No results for input(s): "DDIMER" in the last 168 hours.   Radiology    DG Chest 2 View  Result Date: 02/06/2023 CLINICAL DATA:  Chest pain, nausea vomiting, diarrhea EXAM: CHEST - 2 VIEW COMPARISON:  08/12/2022 FINDINGS: Frontal and lateral views of the chest demonstrate mild enlargement of the cardiac silhouette. Stable ectasia and atherosclerosis of the thoracic aorta. Mild central vascular congestion and increased interstitial prominence, which may reflect mild volume overload. No focal consolidation, effusion, or pneumothorax. No acute bony abnormalities. IMPRESSION: 1. Findings consistent with mild congestive heart failure and developing interstitial edema. Electronically Signed   By: Sharlet Salina M.D.   On: 02/06/2023 17:34    Cardiac Studies   ECHO: from [facility], 01/04/2023 Left Ventricle: Left ventricle is moderately dilated.    Left Ventricle: EF: 40-45%. Quantitative analysis of left ventricular Global Longitudinal Strain (GLS) imaging is -5.400%, which is  abnormal. Ejection fraction measured by 3D is 44%, which is abnormal.    Left Ventricle: There is mild  hypokinesis of the left ventricle.    Left Atrium: Left atrium volume index is severely increased (>48 mL/m2).   Aortic Valve: The aortic valve is tricuspid. The leaflets are severely calcified.   Aortic Valve: There is no regurgitation or stenosis.    Mitral Valve: There is moderate annular calcification.    Mitral Valve: There is mild regurgitation.    Tricuspid Valve: Tricuspid valve structure is normal.    Tricuspid Valve: The right ventricular systolic pressure is normal (<36 mmHg).   Pericardium: There is no pericardial effusion.     Patient Profile     89 y.o. female with PMH of HTN, HLD, hypothyroidism, recent hip fracture in Sept (placed on Xarelto for VTE prophylaxis, stopped during admission in Nov), recent NSTEMI in Nov 2024 at [facility] medically managed and newly diagnosed systolic CHF with reduced EF 40-45% readmitted with NSTEMI  Assessment & Plan    NSTEMI  - admitted at [facility] in Nov with NSTEMI and found to have EF 40-45%. Discharged on ASA, plavix and coreg, declined statin due to intolerance. Plavix was later stopped due to planned knee injection  - pending repeat echo. Family expressed wish to avoid cath  - complete 48 hours of IV heparin (until 7PM today). If EF is stable and no plans for cath, then plan to switch her from ASA to plavix monotherapy (probably not DAPT due to bruising)  Acute on chronic systolic heart failure  - received 40mg  IV lasix 2 days ago, resume home dose of 20mg  daily PO lasix  Frequent PVCs: per [facility] cardiology note "Declines clopidogrel, statin, amiodarone "  Hypertension: coreg   Hyperlipidemia: on crestor     For questions or updates, please contact [facility] Please consult [email or web address] for contact info under        Signed, Azalee Course, PA  02/08/2023, 8:17 AM

## 2023-02-08 NOTE — Plan of Care (Signed)
Will continue to monitor patient.

## 2023-02-08 NOTE — Progress Notes (Signed)
ANTICOAGULATION CONSULT NOTE - Follow-Up Consult  Pharmacy Consult for Heparin Indication: chest pain/ACS  Patient Measurements: Height: 5\' 2"  (157.5 cm) Weight: 64.6 kg (142 lb 8 oz) IBW/kg (Calculated) : 50.1 Heparin Dosing Weight: 63.7 kg  Vital Signs: Temp: 98.4 F (36.9 C) (12/09 0741) Temp Source: Oral (12/09 0741) BP: 124/54 (12/09 0741) Pulse Rate: 75 (12/09 0741)  Labs: Recent Labs    02/06/23 1637 02/06/23 1808 02/06/23 2128 02/06/23 2128 02/07/23 0258 02/07/23 0834 02/07/23 1025 02/07/23 1140 02/07/23 1954 02/08/23 0348  HGB 12.8  --   --   --   --  11.5*  --  11.7*  --  10.8*  HCT 39.0  --   --   --   --  34.6*  --  35.2*  --  33.1*  PLT 298  --   --   --   --  291  --  297  --  257  HEPARINUNFRC  --   --   --    < > 0.11*  --   --  0.19* 0.37 0.43  CREATININE 0.98  --   --   --   --  0.93  --   --   --   --   CKTOTAL  --   --  105  --   --   --   --   --   --   --   TROPONINIHS 1,424*   < > 2,356*  --  2,411* 2,148* 2,020*  --   --   --    < > = values in this interval not displayed.    Estimated Creatinine Clearance: 30.5 mL/min (by C-G formula based on SCr of 0.93 mg/dL).   Medical History: Past Medical History:  Diagnosis Date   Hypertension    Hypothyroidism     Assessment: 89 yo F with a history of HTN, HLD, NSTEMI, and hypothyroidism. Patient is presenting with chest pain. Heparin per pharmacy consult placed.  Plan for medical management with heparin x 48 hours.  Heparin level is therapeutic this AM at 0.43. Hgb 10.8, pltc 257.  Plan for repeat Echo and possible cath if patient amenable vs medical management with heparin stop this evening @1900 .   Goal of Therapy:  Heparin level 0.3-0.7 units/ml Monitor platelets by anticoagulation protocol: Yes   Plan:  Cont heparin 1100 units/hr Follow-up echo vs 48 hours stop time for heparin (12/9 ~ 7pm)  Trixie Rude, PharmD Clinical Pharmacist 02/08/2023  8:21 AM

## 2023-02-08 NOTE — Plan of Care (Signed)
  Problem: Clinical Measurements: Goal: Ability to maintain clinical measurements within normal limits will improve Outcome: Progressing Goal: Respiratory complications will improve Outcome: Progressing Goal: Cardiovascular complication will be avoided Outcome: Progressing   Problem: Coping: Goal: Level of anxiety will decrease Outcome: Progressing   Problem: Safety: Goal: Ability to remain free from injury will improve Outcome: Progressing   Problem: Cardiac: Goal: Ability to achieve and maintain adequate cardiovascular perfusion will improve Outcome: Progressing   Problem: Cardiac: Goal: Ability to achieve and maintain adequate cardiovascular perfusion will improve Outcome: Progressing   Problem: Cardiac: Goal: Ability to achieve and maintain adequate cardiopulmonary perfusion will improve Outcome: Progressing

## 2023-02-08 NOTE — Progress Notes (Signed)
PROGRESS NOTE    Vicki [name]  YQM:[id number] DOB: 12/02/1923 DOA: 02/06/2023 PCP: Deloris Ping, MD   Brief Narrative: Vicki [name] is a 89 y.o. female with a history of hypertension, NSTEMI, hypothyroidism, heart failure.  Patient presented secondary to chest pain with evidence of NSTEMI. Cardiology consulted. Troponin peak of 2,411. Cardiology recommendation for [facility], however patient prefers to pursue medical management. Diuresis initiated for acute on chronic heart failure.   Assessment and Plan:  NSTEMI Patient with initial chest pain and troponin elevation with peak of 2,411. Cardiology consulted with initial plan to consider LHC. Patient declining intervention at this time and prefers medical management. Heparin IV -Cardiology recommendations: repeat Transthoracic Echocardiogram, Heparin IV x48 hours, metoprolol  Acute on chronic systolic heart failure No diuretic given on admission. Cardiology gave Lasix 40 mg IV x1 followed by transition to Lasix PO. -Strict in/out and daily weights -Lasix PO  Primary hypertension -Continue Coreg  Hyperlipidemia -Continue Crestor  Hypothyroidism -Continue Synthroid   DVT prophylaxis: Heparin IV Code Status:   Code Status: Limited: Do not attempt resuscitation (DNR) -DNR-LIMITED -Do Not Intubate/DNI  Family Communication: None at bedside Disposition Plan: Discharge home pending cardiology recommendations   Consultants:  Cardiology  Procedures:  None  Antimicrobials: None    Subjective: No chest pain or dyspnea.  Objective: BP (!) 124/54 (BP Location: Right Arm)   Pulse 75   Temp 98.4 F (36.9 C) (Oral)   Resp 15   Ht 5\' 2"  (1.575 m)   Wt 64.6 kg   SpO2 92%   BMI 26.06 kg/m   Examination:  General exam: Appears calm and comfortable Respiratory system: Clear to auscultation. Respiratory effort normal. Cardiovascular system: S1 & S2 heard, RRR. Gastrointestinal system: Abdomen is nondistended,  soft and nontender.  Normal bowel sounds heard. Central nervous system: Alert and oriented. No focal neurological deficits. Musculoskeletal: BLE pitting edema. No calf tenderness Psychiatry: Judgement and insight appear normal. Mood & affect appropriate.    Data Reviewed: I have personally reviewed following labs and imaging studies  CBC Lab Results  Component Value Date   WBC 7.9 02/08/2023   RBC 3.45 (L) 02/08/2023   HGB 10.8 (L) 02/08/2023   HCT 33.1 (L) 02/08/2023   MCV 95.9 02/08/2023   MCH 31.3 02/08/2023   PLT 257 02/08/2023   MCHC 32.6 02/08/2023   RDW 15.9 (H) 02/08/2023   LYMPHSABS 1.2 02/07/2023   MONOABS 1.0 02/07/2023   EOSABS 0.0 02/07/2023   BASOSABS 0.0 02/07/2023     Last metabolic panel Lab Results  Component Value Date   NA 135 02/08/2023   K 3.6 02/08/2023   CL 104 02/08/2023   CO2 20 (L) 02/08/2023   BUN 23 02/08/2023   CREATININE 0.79 02/08/2023   GLUCOSE 111 (H) 02/08/2023   GFRNONAA >60 02/08/2023   CALCIUM 8.8 (L) 02/08/2023   PHOS 3.4 02/06/2023   PROT 5.5 (L) 02/07/2023   ALBUMIN 3.2 (L) 02/07/2023   BILITOT 0.6 02/07/2023   ALKPHOS 51 02/07/2023   AST 44 (H) 02/07/2023   ALT 30 02/07/2023   ANIONGAP 11 02/08/2023    GFR: Estimated Creatinine Clearance: 35.5 mL/min (by C-G formula based on SCr of 0.79 mg/dL).  No results found for this or any previous visit (from the past 240 hour(s)).    Radiology Studies: DG Chest 2 View  Result Date: 02/06/2023 CLINICAL DATA:  Chest pain, nausea vomiting, diarrhea EXAM: CHEST - 2 VIEW COMPARISON:  08/12/2022 FINDINGS: Frontal and lateral  views of the chest demonstrate mild enlargement of the cardiac silhouette. Stable ectasia and atherosclerosis of the thoracic aorta. Mild central vascular congestion and increased interstitial prominence, which may reflect mild volume overload. No focal consolidation, effusion, or pneumothorax. No acute bony abnormalities. IMPRESSION: 1. Findings consistent with  mild congestive heart failure and developing interstitial edema. Electronically Signed   By: Sharlet Salina M.D.   On: 02/06/2023 17:34      LOS: 2 days    Jacquelin Hawking, MD [facility] 02/08/2023, 11:10 AM   If 7PM-7AM, please contact night-coverage [email or web address]

## 2023-02-09 DIAGNOSIS — I10 Essential (primary) hypertension: Secondary | ICD-10-CM | POA: Diagnosis not present

## 2023-02-09 DIAGNOSIS — E039 Hypothyroidism, unspecified: Secondary | ICD-10-CM | POA: Diagnosis not present

## 2023-02-09 DIAGNOSIS — I5023 Acute on chronic systolic (congestive) heart failure: Secondary | ICD-10-CM | POA: Diagnosis not present

## 2023-02-09 DIAGNOSIS — I214 Non-ST elevation (NSTEMI) myocardial infarction: Secondary | ICD-10-CM | POA: Diagnosis not present

## 2023-02-09 LAB — BASIC METABOLIC PANEL
Anion gap: 7 (ref 5–15)
BUN: 26 mg/dL — ABNORMAL HIGH (ref 8–23)
CO2: 22 mmol/L (ref 22–32)
Calcium: 8.3 mg/dL — ABNORMAL LOW (ref 8.9–10.3)
Chloride: 107 mmol/L (ref 98–111)
Creatinine, Ser: 1 mg/dL (ref 0.44–1.00)
GFR, Estimated: 51 mL/min — ABNORMAL LOW (ref >60)
Glucose, Bld: 97 mg/dL (ref 70–99)
Potassium: 3.6 mmol/L (ref 3.5–5.1)
Sodium: 136 mmol/L (ref 135–145)

## 2023-02-09 LAB — CBC
HCT: 33 % — ABNORMAL LOW (ref 36.0–46.0)
Hemoglobin: 10.6 g/dL — ABNORMAL LOW (ref 12.0–15.0)
MCH: 30.9 pg (ref 26.0–34.0)
MCHC: 32.1 g/dL (ref 30.0–36.0)
MCV: 96.2 fL (ref 80.0–100.0)
Platelets: 255 10*3/uL (ref 150–400)
RBC: 3.43 MIL/uL — ABNORMAL LOW (ref 3.87–5.11)
RDW: 15.7 % — ABNORMAL HIGH (ref 11.5–15.5)
WBC: 8.7 10*3/uL (ref 4.0–10.5)
nRBC: 0 % (ref 0.0–0.2)

## 2023-02-09 LAB — LIPOPROTEIN A (LPA): Lipoprotein (a): 127.6 nmol/L — ABNORMAL HIGH (ref <75.0)

## 2023-02-09 LAB — HEPARIN LEVEL (UNFRACTIONATED): Heparin Unfractionated: <0.1 [IU]/mL — ABNORMAL LOW (ref 0.30–0.70)

## 2023-02-09 LAB — MAGNESIUM: Magnesium: 2.2 mg/dL (ref 1.7–2.4)

## 2023-02-09 MED ORDER — POTASSIUM CHLORIDE CRYS ER 20 MEQ PO TBCR
20.0000 meq | EXTENDED_RELEASE_TABLET | Freq: Once | ORAL | Status: AC
  Administered 2023-02-09: 20 meq via ORAL
  Filled 2023-02-09: qty 1

## 2023-02-09 MED ORDER — ROSUVASTATIN CALCIUM 10 MG PO TABS: 10.0000 mg | ORAL_TABLET | Freq: Every day | ORAL | 0 refills | Status: DC

## 2023-02-09 MED ORDER — ENSURE ENLIVE PO LIQD: 237.0000 mL | Freq: Two times a day (BID) | ORAL | Status: AC

## 2023-02-09 MED ORDER — LOSARTAN POTASSIUM 25 MG PO TABS: 12.5000 mg | ORAL_TABLET | Freq: Every day | ORAL | 0 refills | Status: DC

## 2023-02-09 NOTE — Plan of Care (Signed)
  Problem: Clinical Measurements: Goal: Ability to maintain clinical measurements within normal limits will improve Outcome: Progressing Goal: Respiratory complications will improve Outcome: Progressing Goal: Cardiovascular complication will be avoided Outcome: Progressing   Problem: Safety: Goal: Ability to remain free from injury will improve Outcome: Progressing   Problem: Cardiac: Goal: Ability to achieve and maintain adequate cardiovascular perfusion will improve Outcome: Progressing   Problem: Cardiac: Goal: Ability to achieve and maintain adequate cardiovascular perfusion will improve Outcome: Progressing   Problem: Cardiac: Goal: Ability to achieve and maintain adequate cardiopulmonary perfusion will improve Outcome: Progressing

## 2023-02-09 NOTE — Care Management Important Message (Signed)
Important Message  Patient Details  Name: DEZZIE AGRAMONTE MRN: [id number] Date of Birth: 08/27/1923   Important Message Given:  Yes - Medicare IM     Sherilyn Banker 02/09/2023, 2:55 PM

## 2023-02-09 NOTE — Progress Notes (Signed)
   Patient Name: Vicki [name] Date of Encounter: 02/09/2023 [facility] Cardiologist: None   Interval Summary  .    Sitting up in the chair, no complaints. Wants to go home.   Vital Signs .    Vitals:   02/08/23 1931 02/08/23 2004 02/09/23 0536 02/09/23 0730  BP: 122/77  (!) 104/54 119/80  Pulse: 77 73 68 69  Resp: (!) 21 19 20 17   Temp: 98.2 F (36.8 C)  98.5 F (36.9 C) 97.6 F (36.4 C)  TempSrc: Oral  Oral Oral  SpO2: 94%  96% 97%  Weight:   64.3 kg   Height:        Intake/Output Summary (Last 24 hours) at 02/09/2023 1044 Last data filed at 02/09/2023 0951 Gross per 24 hour  Intake 596 ml  Output 1700 ml  Net -1104 ml      02/09/2023    5:36 AM 02/08/2023    4:52 AM 02/07/2023    4:00 AM  Last 3 Weights  Weight (lbs) 141 lb 12.1 oz 142 lb 8 oz 144 lb 14.4 oz  Weight (kg) 64.3 kg 64.638 kg 65.726 kg      Telemetry/ECG    Sinus Rhythm, PVCs - Personally Reviewed  Physical Exam .   GEN: No acute distress.   Neck: No JVD Cardiac: RRR, no murmurs, rubs, or gallops.  Respiratory: Clear to auscultation bilaterally. GI: Soft, nontender, non-distended  MS: No edema  Assessment & Plan .     89 y.o. female with PMH of HTN, HLD, hypothyroidism, recent hip fracture in Sept (placed on Xarelto for VTE prophylaxis, stopped during admission in Nov), recent NSTEMI in Nov 2024 at [facility] medically managed and newly diagnosed systolic CHF with reduced EF 40-45% readmitted with NSTEMI   NSTEMI -- was admitted to [facility] 01/2023 with NSTEMI and found to have LVEF of 40-45%. Discharge on ASA, plavix, coreg.  -- presented with recurrent chest pain on 12/7. hsTn peaked at 2411 -- plans for medical management per previous discussions with family and patient -- has completed IV heparin (48hrs) -- continue ASA, coreg, losartan  Acute on Chronic systolic HF -- s/p IV lasix, net - 949cc -- has been resumed on lasix 20mg  daily  -- echo showed LVEF of 40-45%, global  hypokinesis, g2dd, normal RV, moderate MR, mild AS -- GDMT: continue coreg, losartan. Given advanced age, would not add additional therapy   HLD -- will continue statin, on lovaza   For questions or updates, please contact [facility] Please consult [email or web address] for contact info under        Signed, Laverda Page, NP

## 2023-02-09 NOTE — Progress Notes (Signed)
Heart Failure Navigator Progress Note  Assessed for Heart & Vascular TOC clinic readiness.  Patient does not meet criteria due to EF 40-45%, spoke with patient and she wants to return to her Cardiologist at [facility].   Navigator will sign off at this time.   Rhae Hammock, BSN, Scientist, clinical (histocompatibility and immunogenetics) Only

## 2023-02-09 NOTE — Evaluation (Signed)
Physical Therapy Evaluation Patient Details Name: Vicki [name] MRN: [id number] DOB: 04/29/1923 Today's Date: 02/09/2023  History of Present Illness  Pt is 89 year old presented to [facility] on  02/06/23 for chest pain. Pt with NSTEMI and acute on chronic heart failure. PMH - HTN, NSTEMI, chf, arthritis, THR  Clinical Impression  Pt at or very close to baseline with mobility. From PT standpoint ready for dc home when medically ready. Pt has been receiving HHPT at home which I recommend resuming however pt would like a different therapist. Pt's granddaughter had been living with pt until she moved out today. Pt would like assist with shower and grocery shopping. Also interested in someone at night. Pt reports grandson's wife may be present some during the day. Will ask TOC for resources.         If plan is discharge home, recommend the following: Assist for transportation;Help with stairs or ramp for entrance (Assist with shopping)   Can travel by private vehicle        Equipment Recommendations None recommended by PT  Recommendations for Other Services       Functional Status Assessment Patient has had a recent decline in their functional status and demonstrates the ability to make significant improvements in function in a reasonable and predictable amount of time.     Precautions / Restrictions Precautions Precautions: Fall Restrictions Weight Bearing Restrictions: No      Mobility  Bed Mobility Overal bed mobility: Needs Assistance Bed Mobility: Supine to Sit     Supine to sit: Supervision, HOB elevated     General bed mobility comments: supervision for lines    Transfers Overall transfer level: Needs assistance Equipment used: Rollator (4 wheels) Transfers: Sit to/from Stand Sit to Stand: Supervision, From elevated surface           General transfer comment: Assist for safety and lines    Ambulation/Gait Ambulation/Gait assistance: Supervision Gait Distance  (Feet): 125 Feet Assistive device: Rollator (4 wheels) Gait Pattern/deviations: Step-through pattern, Decreased stride length Gait velocity: decr Gait velocity interpretation: 1.31 - 2.62 ft/sec, indicative of limited community ambulator   General Gait Details: Steady gait with use of rollator. Supervision for safety and lines  Stairs            Wheelchair Mobility     Tilt Bed    Modified Rankin (Stroke Patients Only)       Balance Overall balance assessment: Needs assistance Sitting-balance support: No upper extremity supported, Feet supported Sitting balance-Leahy Scale: Good     Standing balance support: Single extremity supported, Bilateral upper extremity supported, During functional activity Standing balance-Leahy Scale: Poor Standing balance comment: UE support and supervision                             Pertinent Vitals/Pain Pain Assessment Pain Assessment: Faces Faces Pain Scale: Hurts little more Pain Location: rt hip with elevation of legrest Pain Descriptors / Indicators: Grimacing Pain Intervention(s): Repositioned    Home Living Family/patient expects to be discharged to:: Private residence Living Arrangements: Alone Available Help at Discharge: Family;Available PRN/intermittently Type of Home: House Home Access: Stairs to enter Entrance Stairs-Rails: Right;Left;Can reach both Entrance Stairs-Number of Steps: 3   Home Layout: Two level;Able to live on main level with bedroom/bathroom Home Equipment: Rollator (4 wheels);BSC/3in1;Shower seat;Toilet riser;Lift chair Additional Comments: Granddaughter has been living with pt until today and assisting with showers and grocery shopping  Prior Function Prior Level of Function : Independent/Modified Independent             Mobility Comments: Uses rollator ADLs Comments: Fixes meals, modified independent with dressing, toileting. Has had assist of granddaughter for showers and  grocery shopping.     Extremity/Trunk Assessment   Upper Extremity Assessment Upper Extremity Assessment: Defer to OT evaluation    Lower Extremity Assessment Lower Extremity Assessment: Generalized weakness       Communication   Communication Communication: Hearing impairment  Cognition Arousal: Alert Behavior During Therapy: WFL for tasks assessed/performed Overall Cognitive Status: Within Functional Limits for tasks assessed                                          General Comments General comments (skin integrity, edema, etc.): VSS on RA    Exercises     Assessment/Plan    PT Assessment    PT Problem List         PT Treatment Interventions      PT Goals (Current goals can be found in the Care Plan section)  Acute Rehab PT Goals Patient Stated Goal: return home PT Goal Formulation: With patient Time For Goal Achievement: 02/16/23 Potential to Achieve Goals: Good    Frequency       Co-evaluation               AM-PAC PT "6 Clicks" Mobility  Outcome Measure Help needed turning from your back to your side while in a flat bed without using bedrails?: None Help needed moving from lying on your back to sitting on the side of a flat bed without using bedrails?: A Little Help needed moving to and from a bed to a chair (including a wheelchair)?: A Little Help needed standing up from a chair using your arms (e.g., wheelchair or bedside chair)?: A Little Help needed to walk in hospital room?: A Little Help needed climbing 3-5 steps with a railing? : A Little 6 Click Score: 19    End of Session   Activity Tolerance: Patient tolerated treatment well Patient left: in chair;with call bell/phone within reach;with chair alarm set Nurse Communication: Mobility status PT Visit Diagnosis: Other abnormalities of gait and mobility (R26.89);Muscle weakness (generalized) (M62.81)    Time: 4696-2952 PT Time Calculation (min) (ACUTE ONLY): 30  min   Charges:   PT Evaluation $PT Eval Moderate Complexity: 1 Mod PT Treatments $Gait Training: 8-22 mins PT General Charges $$ ACUTE PT VISIT: 1 Visit         [facility] PT Acute Rehabilitation Services Office [phone]   Angelina Ok [facility] 02/09/2023, 11:06 AM

## 2023-02-09 NOTE — Progress Notes (Signed)
OT Cancellation Note  Patient Details Name: Vicki [name] MRN: [id number] DOB: 1923/11/13   Cancelled Treatment:    Reason Eval/Treat Not Completed: Other (comment) OT order received and pt's chart reviewed. Pt evaled by PT and determined that she is very close to baseline using Rolator for functional mobility. Plan is to discharge today and HHOT and PT has been recommended. Family will be able to provide assistance during the day as needed with pt being alone at night. No acute OT needs identified. Recommend full OT evaluation with home health OT once initiated to determine OT needs at home. Thank you for the referral.  Limmie Patricia, OTR/L,CBIS  Supplemental OT - [facility] and [facility] Secure Chat Preferred   02/09/2023, 12:20 PM

## 2023-02-09 NOTE — Discharge Instructions (Signed)
Vicki [name],  You were in the hospital with concern for a heart attack. This was managed with medication only and your symptoms have resolved. The cardiologist has made some medication recommendations for you on discharge; please continue your medications as prescribed and directed. Please follow-up with your PCP for a hospital follow-up visit.

## 2023-02-09 NOTE — TOC Transition Note (Signed)
Transition of Care [facility]) - CM/SW Discharge Note   Patient Details  Name: Vicki [name] MRN: [id number] Date of Birth: Feb 04, 1924  [facility] CM/SW Contact:  Lawerance Sabal, RN Phone Number: 02/09/2023, 11:04 AM   Clinical Narrative:     Sherron Monday w patient at bedside with attending. She is adamant about being DC'd to home. Therapy eval pending.  She states that her granddaughter who lives with her is moving out today. She states that her daughter in law will be able to be with her during the day to assist and she will be alone at night.  She has a rollator and a 3/1 at home she plans on using during the night.  She continues to decline cardiac cath when discussed with attending. She is agreeable to [facility] services and would like [facility] again. Referral has been accepted.  Per patient she states that family will be available to transport her home.    Please place [facility] PT OT order.      Barriers to Discharge: Continued Medical Work up   Patient Goals and CMS Choice CMS Medicare.gov Compare Post Acute Care list provided to:: Patient Choice offered to / list presented to : Patient  Discharge Placement                         Discharge Plan and Services Additional resources added to the After Visit Summary for     Discharge Planning Services: CM Consult Post Acute Care Choice: Home Health                    HH Arranged: PT, OT [facility] Agency: [facility] Date [facility] Agency Contacted: 02/09/23 Time HH Agency Contacted: 1104 Representative spoke with at [facility] Agency: Kandee Keen  Social Determinants of Health (SDOH) Interventions SDOH Screenings   Food Insecurity: No Food Insecurity (02/07/2023)  Housing: Low Risk  (02/07/2023)  Transportation Needs: No Transportation Needs (02/07/2023)  Utilities: Not At Risk (02/07/2023)  Financial Resource Strain: Low Risk  (09/06/2022)   Received from [facility]  Physical Activity: Unknown (09/06/2022)   Received from [facility]  Social Connections: Socially Integrated (09/06/2022)   Received from [facility]  Stress: No Stress Concern Present (01/02/2023)   Received from [facility]  Tobacco Use: Low Risk  (02/06/2023)     Readmission Risk Interventions     No data to display

## 2023-02-09 NOTE — Discharge Summary (Signed)
Physician Discharge Summary   Patient: Vicki [name] MRN: [id number] DOB: 21-Feb-1924  Admit date:     02/06/2023  Discharge date: 02/09/23  Discharge Physician: Jacquelin Hawking, MD   PCP: Deloris Ping, MD   Recommendations at discharge:  PCP visit for hospital follow-up  Discharge Diagnoses: Principal Problem:   NSTEMI (non-ST elevated myocardial infarction) [facility]) Active Problems:   Acute on chronic systolic CHF (congestive heart failure) (HCC)   Essential hypertension   Hypothyroidism  Resolved Problems:   * No resolved hospital problems. *  Hospital Course: Vicki [name] is a 89 y.o. female with a history of hypertension, NSTEMI, hypothyroidism, heart failure.  Patient presented secondary to chest pain with evidence of NSTEMI. Cardiology consulted. Troponin peak of 2,411. Cardiology recommendation for [facility], however patient prefers to pursue medical management. Diuresis initiated for acute on chronic heart failure.  Assessment and Plan:  NSTEMI Patient with initial chest pain and troponin elevation with peak of 2,411. Cardiology consulted with initial plan to consider [facility]. Patient declining intervention at this time and prefers medical management. Heparin IV x48 hours completed. Transthoracic Echocardiogram significant for LVEF of 40-45% with grade 2 diastolic dysfunction with global hypokinesis and moderate LV dilation. Cardiology recommendation for aspirin, Coreg, losartan, Crestor, Lovaza at discharge.    Acute on chronic combined systolic and diastolic heart failure No diuretic given on admission. Transthoracic Echocardiogram significant for an LVEF of 40-45% with grade 2 diastolic dysfunction. Cardiology gave Lasix 40 mg IV x1 followed by transition to Lasix PO. Cardiology recommendation to continue on losartan and Coreg on discharge.   Primary hypertension -Continue Coreg   Hyperlipidemia Continue Crestor, dosed reduced for renal function and history of statin  intolerance.   Hypothyroidism Continue Synthroid.   Consultants: Cardiology Procedures performed: Transthoracic Echocardiogram   Disposition: Home health Diet recommendation: Cardiac diet   DISCHARGE MEDICATION: Allergies as of 02/09/2023       Reactions   Codeine Nausea Only   Makes her crazy Hallucinations    Latex Itching   Penicillins Rash   YEAST INFECTION  YEAST INFECTION   Sulfa Antibiotics Anaphylaxis, Other (See Comments)   Heart stops   Thiopental Anaphylaxis, Rash   Other reaction(s): Unknown  CARDIAC ARREST  CARDIAC ARREST        Medication List     STOP taking these medications    atorvastatin 80 MG tablet Commonly known as: LIPITOR   clopidogrel 75 MG tablet Commonly known as: PLAVIX       TAKE these medications    aspirin EC 81 MG tablet Take 81 mg by mouth daily. Swallow whole.   Biotin 10 MG Caps Take 10 mg by mouth daily.   CALCIUM 600/VITAMIN D PO Take 1 tablet by mouth daily.   carvedilol 6.25 MG tablet Commonly known as: COREG Take 6.25 mg by mouth 2 (two) times daily with a meal.   clotrimazole-betamethasone cream Commonly known as: LOTRISONE Apply 1 Application topically 2 (two) times daily.   feeding supplement Liqd Take 237 mLs by mouth 2 (two) times daily between meals.   Fish Oil 1000 MG Caps Take 1,000 mg by mouth daily.   furosemide 20 MG tablet Commonly known as: LASIX Take 20 mg by mouth daily.   levothyroxine 50 MCG tablet Commonly known as: SYNTHROID Take 50 mcg by mouth daily.   losartan 25 MG tablet Commonly known as: COZAAR Take 0.5 tablets (12.5 mg total) by mouth daily. Start taking on: February 10, 2023   [facility]  FIBER PO Take 1 Capful by mouth at bedtime.   omeprazole 20 MG capsule Commonly known as: PRILOSEC Take 20 mg by mouth daily.   rosuvastatin 10 MG tablet Commonly known as: CRESTOR Take 1 tablet (10 mg total) by mouth daily. Start taking on: February 10, 2023   Thera-M  Tabs Take by mouth.        Follow-up Information     Care, [facility] Follow up.   Specialty: Home Health Services Contact information: [address] [place] [address] [phone]         Deloris Ping, MD. Schedule an appointment as soon as possible for a visit in 1 week(s).   Specialty: Family Medicine Why: For hospital follow-up Contact information: [address] [place] [address] [phone]                Discharge Exam: BP 119/80 (BP Location: Right Arm)   Pulse 69   Temp 97.6 F (36.4 C) (Oral)   Resp 17   Ht 5\' 2"  (1.575 m)   Wt 64.3 kg   SpO2 97%   BMI 25.93 kg/m   General exam: Appears calm and comfortable Respiratory system: Clear to auscultation. Respiratory effort normal. Cardiovascular system: S1 & S2 heard, RRR. Gastrointestinal system: Abdomen is nondistended, soft and nontender. Normal bowel sounds heard. Central nervous system: Alert and oriented. No focal neurological deficits. Psychiatry: Judgement and insight appear normal. Mood & affect appropriate.   Condition at discharge: stable  The results of significant diagnostics from this hospitalization (including imaging, microbiology, ancillary and laboratory) are listed below for reference.   Imaging Studies: ECHOCARDIOGRAM COMPLETE  Result Date: 02/08/2023    ECHOCARDIOGRAM REPORT   Patient Name:   Vicki [name] Date of Exam: 02/08/2023 Medical Rec #:  [id number]       Height:       62.0 in Accession #:    [id number]      Weight:       142.5 lb Date of Birth:  13-Sep-1923      BSA:          1.655 m Patient Age:    89 years        BP:           104/55 mmHg Patient Gender: F               HR:           76 bpm. Exam Location:  Inpatient Procedure: 2D Echo, Cardiac Doppler and Color Doppler Indications:    CHF-acute systolic  History:        Patient has no prior history of Echocardiogram examinations.                 CHF; Risk  Factors:Hypertension.  Sonographer:    Vern Claude Referring Phys: [id number] ANASTASSIA DOUTOVA IMPRESSIONS  1. Left ventricular ejection fraction, by estimation, is 40 to 45%. The left ventricle has mildly decreased function. The left ventricle demonstrates global hypokinesis. The left ventricular internal cavity size was moderately dilated. Left ventricular diastolic parameters are consistent with Grade II diastolic dysfunction (pseudonormalization). Elevated left ventricular end-diastolic pressure.  2. Right ventricular systolic function is normal. The right ventricular size is normal. There is mildly elevated pulmonary artery systolic pressure.  3. Left atrial size was moderately dilated.  4. The mitral valve is abnormal. Moderate mitral valve regurgitation. No evidence of mitral stenosis. Moderate mitral annular calcification.  5. The aortic  valve was not well visualized. There is severe calcifcation of the aortic valve. There is severe thickening of the aortic valve. Aortic valve regurgitation is trivial. Mild aortic valve stenosis.  6. The inferior vena cava is dilated in size with >50% respiratory variability, suggesting right atrial pressure of 8 mmHg. FINDINGS  Left Ventricle: Left ventricular ejection fraction, by estimation, is 40 to 45%. The left ventricle has mildly decreased function. The left ventricle demonstrates global hypokinesis. The left ventricular internal cavity size was moderately dilated. There is no left ventricular hypertrophy. Left ventricular diastolic parameters are consistent with Grade II diastolic dysfunction (pseudonormalization). Elevated left ventricular end-diastolic pressure. Right Ventricle: The right ventricular size is normal. No increase in right ventricular wall thickness. Right ventricular systolic function is normal. There is mildly elevated pulmonary artery systolic pressure. The tricuspid regurgitant velocity is 2.81  m/s, and with an assumed right atrial pressure of 8  mmHg, the estimated right ventricular systolic pressure is 39.6 mmHg. Left Atrium: Left atrial size was moderately dilated. Right Atrium: Right atrial size was normal in size. Pericardium: There is no evidence of pericardial effusion. Mitral Valve: The mitral valve is abnormal. There is moderate thickening of the mitral valve leaflet(s). There is moderate calcification of the mitral valve leaflet(s). Moderate mitral annular calcification. Moderate mitral valve regurgitation. No evidence of mitral valve stenosis. MV peak gradient, 6.9 mmHg. The mean mitral valve gradient is 3.0 mmHg. Tricuspid Valve: The tricuspid valve is normal in structure. Tricuspid valve regurgitation is mild . No evidence of tricuspid stenosis. Aortic Valve: The aortic valve was not well visualized. There is severe calcifcation of the aortic valve. There is severe thickening of the aortic valve. Aortic valve regurgitation is trivial. Mild aortic stenosis is present. Aortic valve mean gradient measures 4.0 mmHg. Aortic valve peak gradient measures 8.0 mmHg. Aortic valve area, by VTI measures 1.70 cm. Pulmonic Valve: The pulmonic valve was normal in structure. Pulmonic valve regurgitation is mild. No evidence of pulmonic stenosis. Aorta: The aortic root is normal in size and structure. Venous: The inferior vena cava is dilated in size with greater than 50% respiratory variability, suggesting right atrial pressure of 8 mmHg. IAS/Shunts: No atrial level shunt detected by color flow Doppler.  LEFT VENTRICLE PLAX 2D LVIDd:         5.40 cm      Diastology LVIDs:         4.20 cm      LV e' medial:    5.59 cm/s LV PW:         0.60 cm      LV E/e' medial:  23.8 LV IVS:        0.60 cm      LV e' lateral:   7.70 cm/s LVOT diam:     1.90 cm      LV E/e' lateral: 17.3 LV SV:         46 LV SV Index:   28 LVOT Area:     2.84 cm  LV Volumes (MOD) LV vol d, MOD A2C: 202.0 ml LV vol d, MOD A4C: 191.0 ml LV vol s, MOD A2C: 111.0 ml LV vol s, MOD A4C: 101.0 ml LV  SV MOD A2C:     91.0 ml LV SV MOD A4C:     191.0 ml LV SV MOD BP:      88.7 ml RIGHT VENTRICLE             IVC RV Basal diam:  3.00 cm  IVC diam: 2.10 cm RV Mid diam:    2.30 cm RV S prime:     11.30 cm/s TAPSE (M-mode): 2.2 cm LEFT ATRIUM             Index        RIGHT ATRIUM           Index LA diam:        4.30 cm 2.60 cm/m   RA Area:     11.10 cm LA Vol (A2C):   81.0 ml 48.94 ml/m  RA Volume:   20.80 ml  12.57 ml/m LA Vol (A4C):   71.8 ml 43.38 ml/m LA Biplane Vol: 82.4 ml 49.78 ml/m  AORTIC VALVE                    PULMONIC VALVE AV Area (Vmax):    1.87 cm     PV Vmax:       0.99 m/s AV Area (Vmean):   1.60 cm     PV Peak grad:  3.9 mmHg AV Area (VTI):     1.70 cm AV Vmax:           141.00 cm/s AV Vmean:          94.400 cm/s AV VTI:            0.269 m AV Peak Grad:      8.0 mmHg AV Mean Grad:      4.0 mmHg LVOT Vmax:         93.20 cm/s LVOT Vmean:        53.300 cm/s LVOT VTI:          0.161 m LVOT/AV VTI ratio: 0.60  AORTA Ao Root diam: 2.80 cm Ao Asc diam:  2.60 cm MITRAL VALVE                 TRICUSPID VALVE MV Area (PHT): 4.10 cm      TR Peak grad:   31.6 mmHg MV Area VTI:   1.47 cm      TR Vmax:        281.00 cm/s MV Peak grad:  6.9 mmHg MV Mean grad:  3.0 mmHg      SHUNTS MV Vmax:       1.31 m/s      Systemic VTI:  0.16 m MV Vmean:      80.4 cm/s     Systemic Diam: 1.90 cm MV Decel Time: 185 msec MR Peak grad:   100.9 mmHg MR Mean grad:   62.3 mmHg MR Vmax:        502.33 cm/s MR Vmean:       364.3 cm/s MR PISA:        0.57 cm MR PISA Radius: 0.30 cm MV E velocity: 133.00 cm/s MV A velocity: 114.00 cm/s MV E/A ratio:  1.17 Charlton Haws MD Electronically signed by Charlton Haws MD Signature Date/Time: 02/08/2023/2:07:17 PM    Final    DG Chest 2 View  Result Date: 02/06/2023 CLINICAL DATA:  Chest pain, nausea vomiting, diarrhea EXAM: CHEST - 2 VIEW COMPARISON:  08/12/2022 FINDINGS: Frontal and lateral views of the chest demonstrate mild enlargement of the cardiac silhouette. Stable ectasia  and atherosclerosis of the thoracic aorta. Mild central vascular congestion and increased interstitial prominence, which may reflect mild volume overload. No focal consolidation, effusion, or pneumothorax. No acute bony abnormalities. IMPRESSION: 1. Findings consistent with mild congestive heart failure and developing interstitial edema. Electronically Signed  By: Sharlet Salina M.D.   On: 02/06/2023 17:34    Microbiology: No results found for this or any previous visit.  Labs: CBC: Recent Labs  Lab 02/06/23 1637 02/07/23 0834 02/07/23 1140 02/08/23 0348 02/09/23 0403  WBC 9.7 8.8 9.8 7.9 8.7  NEUTROABS  --  6.5  --   --   --   HGB 12.8 11.5* 11.7* 10.8* 10.6*  HCT 39.0 34.6* 35.2* 33.1* 33.0*  MCV 95.4 94.3 94.9 95.9 96.2  PLT 298 291 297 257 255   Basic Metabolic Panel: Recent Labs  Lab 02/06/23 1637 02/06/23 2128 02/07/23 0834 02/08/23 0903 02/09/23 0358 02/09/23 0403  NA 135  --  136 135  --  136  K 3.9  --  4.0 3.6  --  3.6  CL 104  --  107 104  --  107  CO2 19*  --  19* 20*  --  22  GLUCOSE 144*  --  173* 111*  --  97  BUN 24*  --  20 23  --  26*  CREATININE 0.98  --  0.93 0.79  --  1.00  CALCIUM 9.2  --  8.9 8.8*  --  8.3*  MG  --  2.2  --  2.0 2.2  --   PHOS  --  3.4  --   --   --   --    Liver Function Tests: Recent Labs  Lab 02/06/23 1637 02/07/23 0834  AST 38 44*  ALT 20 30  ALKPHOS 60 51  BILITOT 0.8 0.6  PROT 6.2* 5.5*  ALBUMIN 3.8 3.2*    Discharge time spent: 35 minutes.  Signed: Jacquelin Hawking, MD [facility] 02/09/2023

## 2023-05-08 ENCOUNTER — Other Ambulatory Visit: Payer: Self-pay

## 2023-05-08 ENCOUNTER — Emergency Department (HOSPITAL_COMMUNITY)

## 2023-05-08 ENCOUNTER — Encounter (HOSPITAL_COMMUNITY): Payer: Self-pay | Admitting: Emergency Medicine

## 2023-05-08 ENCOUNTER — Inpatient Hospital Stay (HOSPITAL_COMMUNITY)
Admission: EM | Admit: 2023-05-08 | Discharge: 2023-05-13 | DRG: 644 | Disposition: A | Discharge status: Home Health | Attending: Internal Medicine | Admitting: Internal Medicine

## 2023-05-08 DIAGNOSIS — Z7189 Other specified counseling: Secondary | ICD-10-CM | POA: Diagnosis not present

## 2023-05-08 DIAGNOSIS — E8721 Acute metabolic acidosis: Secondary | ICD-10-CM | POA: Diagnosis present

## 2023-05-08 DIAGNOSIS — I2510 Atherosclerotic heart disease of native coronary artery without angina pectoris: Secondary | ICD-10-CM | POA: Diagnosis present

## 2023-05-08 DIAGNOSIS — I5181 Takotsubo syndrome: Secondary | ICD-10-CM

## 2023-05-08 DIAGNOSIS — E871 Hypo-osmolality and hyponatremia: Secondary | ICD-10-CM | POA: Diagnosis present

## 2023-05-08 DIAGNOSIS — Z7902 Long term (current) use of antithrombotics/antiplatelets: Secondary | ICD-10-CM

## 2023-05-08 DIAGNOSIS — I493 Ventricular premature depolarization: Secondary | ICD-10-CM | POA: Diagnosis not present

## 2023-05-08 DIAGNOSIS — I959 Hypotension, unspecified: Secondary | ICD-10-CM | POA: Diagnosis present

## 2023-05-08 DIAGNOSIS — E43 Unspecified severe protein-calorie malnutrition: Secondary | ICD-10-CM | POA: Diagnosis present

## 2023-05-08 DIAGNOSIS — E876 Hypokalemia: Secondary | ICD-10-CM | POA: Diagnosis present

## 2023-05-08 DIAGNOSIS — D72829 Elevated white blood cell count, unspecified: Secondary | ICD-10-CM | POA: Diagnosis present

## 2023-05-08 DIAGNOSIS — E039 Hypothyroidism, unspecified: Secondary | ICD-10-CM | POA: Diagnosis present

## 2023-05-08 DIAGNOSIS — I480 Paroxysmal atrial fibrillation: Secondary | ICD-10-CM | POA: Diagnosis present

## 2023-05-08 DIAGNOSIS — Z79890 Hormone replacement therapy (postmenopausal): Secondary | ICD-10-CM | POA: Diagnosis not present

## 2023-05-08 DIAGNOSIS — Z79899 Other long term (current) drug therapy: Secondary | ICD-10-CM

## 2023-05-08 DIAGNOSIS — I447 Left bundle-branch block, unspecified: Secondary | ICD-10-CM | POA: Diagnosis present

## 2023-05-08 DIAGNOSIS — Z1152 Encounter for screening for COVID-19: Secondary | ICD-10-CM

## 2023-05-08 DIAGNOSIS — E785 Hyperlipidemia, unspecified: Secondary | ICD-10-CM | POA: Diagnosis present

## 2023-05-08 DIAGNOSIS — Z885 Allergy status to narcotic agent status: Secondary | ICD-10-CM

## 2023-05-08 DIAGNOSIS — I252 Old myocardial infarction: Secondary | ICD-10-CM | POA: Diagnosis not present

## 2023-05-08 DIAGNOSIS — Z91040 Latex allergy status: Secondary | ICD-10-CM

## 2023-05-08 DIAGNOSIS — I214 Non-ST elevation (NSTEMI) myocardial infarction: Secondary | ICD-10-CM | POA: Diagnosis not present

## 2023-05-08 DIAGNOSIS — I130 Hypertensive heart and chronic kidney disease with heart failure and stage 1 through stage 4 chronic kidney disease, or unspecified chronic kidney disease: Secondary | ICD-10-CM | POA: Diagnosis present

## 2023-05-08 DIAGNOSIS — I4891 Unspecified atrial fibrillation: Secondary | ICD-10-CM

## 2023-05-08 DIAGNOSIS — I5043 Acute on chronic combined systolic (congestive) and diastolic (congestive) heart failure: Secondary | ICD-10-CM | POA: Diagnosis present

## 2023-05-08 DIAGNOSIS — Z66 Do not resuscitate: Secondary | ICD-10-CM | POA: Diagnosis present

## 2023-05-08 DIAGNOSIS — Z888 Allergy status to other drugs, medicaments and biological substances status: Secondary | ICD-10-CM

## 2023-05-08 DIAGNOSIS — Z515 Encounter for palliative care: Secondary | ICD-10-CM

## 2023-05-08 DIAGNOSIS — I5023 Acute on chronic systolic (congestive) heart failure: Secondary | ICD-10-CM | POA: Diagnosis not present

## 2023-05-08 DIAGNOSIS — I5021 Acute systolic (congestive) heart failure: Secondary | ICD-10-CM | POA: Diagnosis not present

## 2023-05-08 DIAGNOSIS — Z7982 Long term (current) use of aspirin: Secondary | ICD-10-CM

## 2023-05-08 DIAGNOSIS — I509 Heart failure, unspecified: Principal | ICD-10-CM

## 2023-05-08 DIAGNOSIS — N1832 Chronic kidney disease, stage 3b: Secondary | ICD-10-CM | POA: Diagnosis present

## 2023-05-08 DIAGNOSIS — M069 Rheumatoid arthritis, unspecified: Secondary | ICD-10-CM | POA: Diagnosis present

## 2023-05-08 DIAGNOSIS — Z882 Allergy status to sulfonamides status: Secondary | ICD-10-CM

## 2023-05-08 DIAGNOSIS — Z8674 Personal history of sudden cardiac arrest: Secondary | ICD-10-CM

## 2023-05-08 DIAGNOSIS — R54 Age-related physical debility: Secondary | ICD-10-CM | POA: Diagnosis present

## 2023-05-08 DIAGNOSIS — Z6826 Body mass index (BMI) 26.0-26.9, adult: Secondary | ICD-10-CM

## 2023-05-08 DIAGNOSIS — Z880 Allergy status to penicillin: Secondary | ICD-10-CM

## 2023-05-08 DIAGNOSIS — I5042 Chronic combined systolic (congestive) and diastolic (congestive) heart failure: Secondary | ICD-10-CM | POA: Diagnosis present

## 2023-05-08 LAB — CBC
HCT: 42.8 % (ref 36.0–46.0)
Hemoglobin: 14 g/dL (ref 12.0–15.0)
MCH: 31.1 pg (ref 26.0–34.0)
MCHC: 32.7 g/dL (ref 30.0–36.0)
MCV: 95.1 fL (ref 80.0–100.0)
Platelets: 285 10*3/uL (ref 150–400)
RBC: 4.5 MIL/uL (ref 3.87–5.11)
RDW: 14.4 % (ref 11.5–15.5)
WBC: 13.2 10*3/uL — ABNORMAL HIGH (ref 4.0–10.5)
nRBC: 0 % (ref 0.0–0.2)

## 2023-05-08 LAB — BASIC METABOLIC PANEL
Anion gap: 15 (ref 5–15)
BUN: 36 mg/dL — ABNORMAL HIGH (ref 8–23)
CO2: 20 mmol/L — ABNORMAL LOW (ref 22–32)
Calcium: 9.7 mg/dL (ref 8.9–10.3)
Chloride: 97 mmol/L — ABNORMAL LOW (ref 98–111)
Creatinine, Ser: 1.09 mg/dL — ABNORMAL HIGH (ref 0.44–1.00)
GFR, Estimated: 46 mL/min — ABNORMAL LOW (ref >60)
Glucose, Bld: 254 mg/dL — ABNORMAL HIGH (ref 70–99)
Potassium: 4.1 mmol/L (ref 3.5–5.1)
Sodium: 132 mmol/L — ABNORMAL LOW (ref 135–145)

## 2023-05-08 LAB — RESP PANEL BY RT-PCR (RSV, FLU A&B, COVID)  RVPGX2
Influenza A by PCR: NEGATIVE
Influenza B by PCR: NEGATIVE
Resp Syncytial Virus by PCR: NEGATIVE
SARS Coronavirus 2 by RT PCR: NEGATIVE

## 2023-05-08 LAB — BRAIN NATRIURETIC PEPTIDE: B Natriuretic Peptide: 1228.6 pg/mL — ABNORMAL HIGH (ref 0.0–100.0)

## 2023-05-08 LAB — TROPONIN I (HIGH SENSITIVITY)
Troponin I (High Sensitivity): 66 ng/L — ABNORMAL HIGH (ref <18)
Troponin I (High Sensitivity): 2968 ng/L (ref <18)

## 2023-05-08 MED ORDER — FUROSEMIDE 10 MG/ML IJ SOLN
40.0000 mg | INTRAMUSCULAR | Status: AC
  Administered 2023-05-08: 40 mg via INTRAVENOUS
  Filled 2023-05-08: qty 4

## 2023-05-08 NOTE — ED Provider Notes (Signed)
 [facility]ERG[facility]Y DEPT [facility] Emergency Department Provider Note MRN:  [id number]  Arrival date & time: 05/08/23     Chief Complaint   Chest Pain   History of Present Illness   Vicki [name] is a 89 y.o. year-old female presents to the ED with chief complaint of shortness of breath.  Onset was this afternoon around 4 PM.  She states that she has been compliant with her Lasix, but did not urinate as much last night.  She denies having any chest pain.  Denies fever, chills, or productive cough.  She was admitted for NSTEMI back in December.  Last EF was 40 to 45%.  History provided by patient.   Review of Systems  Pertinent positive and negative review of systems noted in HPI.    Physical Exam   Vitals:   05/08/23 1952 05/08/23 2210  BP: (!) 152/88 (!) 145/83  Pulse: 95 92  Resp: (!) 24 (!) 23  Temp:  (!) 97.4 F (36.3 C)  SpO2: 93% 95%    CONSTITUTIONAL:  short of breath-appearing, NAD NEURO:  Alert and oriented x 3, CN 3-12 grossly intact EYES:  eyes equal and reactive ENT/NECK:  Supple, no stridor  CARDIO:  normal rate, regular rhythm, appears well-perfused  PULM: Mild respiratory distress, moderate increased work of breathing, diffuse crackles GI/GU:  non-distended,  MSK/SPINE:  No gross deformities, 1+ pitting edema in lower extremities, moves all extremities  SKIN:  no rash, atraumatic   *Additional and/or pertinent findings included in MDM below  Diagnostic and Interventional Summary    EKG Interpretation Date/Time:    Ventricular Rate:    PR Interval:    QRS Duration:    QT Interval:    QTC Calculation:   R Axis:      Text Interpretation:         Labs Reviewed  BASIC METABOLIC PANEL - Abnormal; Notable for the following components:      Result Value   Sodium 132 (*)    Chloride 97 (*)    CO2 20 (*)    Glucose, Bld 254 (*)    BUN 36 (*)    Creatinine, Ser 1.09 (*)    GFR, Estimated 46 (*)    All other components within normal limits   CBC - Abnormal; Notable for the following components:   WBC 13.2 (*)    All other components within normal limits  BRAIN NATRIURETIC PEPTIDE - Abnormal; Notable for the following components:   B Natriuretic Peptide 1,228.6 (*)    All other components within normal limits  TROPONIN I (HIGH SENSITIVITY) - Abnormal; Notable for the following components:   Troponin I (High Sensitivity) 66 (*)    All other components within normal limits  RESP PANEL BY RT-PCR (RSV, FLU A&B, COVID)  [id number]  TROPONIN I (HIGH SENSITIVITY)    DG Chest Port 1 View  Final Result      Medications  furosemide (LASIX) injection 40 mg (40 mg Intravenous Given 05/08/23 2226)     Procedures  /  Critical Care .Critical Care  Performed by: Roxy Horseman, PA-C Authorized by: Roxy Horseman, PA-C   Critical care provider statement:    Critical care time (minutes):  35   Critical care was necessary to treat or prevent imminent or life-threatening deterioration of the following conditions:  Respiratory failure   Critical care was time spent personally by me on the following activities:  Development of treatment plan with patient or surrogate, discussions with consultants, evaluation  of patient's response to treatment, examination of patient, ordering and review of laboratory studies, ordering and review of radiographic studies, ordering and performing treatments and interventions, pulse oximetry, re-evaluation of patient's condition and review of old charts   ED Course and Medical Decision Making  I have reviewed the triage vital signs, the nursing notes, and pertinent available records from the EMR.  Social Determinants Affecting Complexity of Care: Patient has no clinically significant social determinants affecting this chief complaint..   ED Course: Clinical Course as of 05/08/23 2229  Sat May 08, 2023  2219 Resp panel by RT-PCR (RSV, Flu A&B, Covid) Anterior Nasal Swab COVID and flu are negative [RB]   2219 Troponin I (High Sensitivity)(!) Troponin elevated at 66, thought to be secondary to demand ischemia [RB]  2219 Brain natriuretic peptide(!) BNP is elevated at 1228, consistent with clinical presentation of CHF exacerbation, will treat with Lasix and admit to medicine for diuresis [RB]  2219 CBC(!) Leukocytosis to 13.2, afebrile, COVID and flu are negative, doubt infectious etiology. [RB]  2219 Basic metabolic panelJudieth Keens are lower than recent [RB]  2226 DG Chest Port 1 View Cardiomegaly, interstitial edema [RB]    Clinical Course User Index [RB] Roxy Horseman, PA-C    Medical Decision Making Patient here with shortness of breath.  Onset this afternoon.  She has diffuse crackles.  She is dyspneic with talking and at rest.  I think the patient needs admission for diuresis.  Labs and imaging discussed below.  Amount and/or Complexity of Data Reviewed Labs: ordered. Decision-making details documented in ED Course. Radiology: ordered. Decision-making details documented in ED Course.  Risk Prescription drug management. Decision regarding hospitalization.         Consultants: I consulted with Hospitalist, Dr. Maisie Fus, who is appreciated for admitting.   Treatment and Plan: Patient's exam and diagnostic results are concerning for acute respiratory failure secondary to CHF.  Feel that patient will need admission to the hospital for further treatment and evaluation.    Final Clinical Impressions(s) / ED Diagnoses     ICD-10-CM   1. Acute on chronic congestive heart failure, unspecified heart failure type [facility])  I50.9       ED Discharge Orders     None         Discharge Instructions Discussed with and Provided to Patient:   Discharge Instructions   None      Roxy Horseman, PA-C 05/08/23 2229    Rozelle Logan, DO 05/08/23 2320

## 2023-05-08 NOTE — ED Triage Notes (Signed)
 Patient complaining of left side chest pain starting at approx 4pm today. Reports shortness of breath as well. Hx of CHF and MI in December. Compliant with medications.

## 2023-05-08 NOTE — ED Provider Triage Note (Signed)
 Emergency Medicine Provider Triage Evaluation Note  Vicki [name] , a 89 y.o. female  was evaluated in triage.  Pt complains of SOB and chest pain.  Patient reports persistent chest pain since 4 PM with shortness of breath.  History of heart failure is on Lasix.  Had cortisone shots yesterday, family member does not know if this could play a role in the shortness of breath.  Has lower extremity as well.  Review of Systems  Positive: Leg swelling, short of breath, chest pain Negative: Fevers, nausea, vomiting  Physical Exam  BP (!) 152/88 (BP Location: Left Arm)   Pulse 95   Resp (!) 24   Ht 5\' 2"  (1.575 m)   Wt 65.3 kg   SpO2 93%   BMI 26.34 kg/m  Gen:   Awake, no distress   Resp:  Normal effort  MSK:   Moves extremities without difficulty  Other:  Crackles present in bilateral lower lobes  Medical Decision Making  Medically screening exam initiated at 8:09 PM.  Appropriate orders placed.  SHEMEKA WARDLE was informed that the remainder of the evaluation will be completed by another provider, this initial triage assessment does not replace that evaluation, and the importance of remaining in the ED until their evaluation is complete.   Maxwell Marion, PA-C 05/08/23 2011

## 2023-05-09 DIAGNOSIS — Z7189 Other specified counseling: Secondary | ICD-10-CM

## 2023-05-09 DIAGNOSIS — Z515 Encounter for palliative care: Secondary | ICD-10-CM

## 2023-05-09 DIAGNOSIS — I214 Non-ST elevation (NSTEMI) myocardial infarction: Secondary | ICD-10-CM

## 2023-05-09 DIAGNOSIS — I509 Heart failure, unspecified: Secondary | ICD-10-CM

## 2023-05-09 LAB — CBC
HCT: 41.1 % (ref 36.0–46.0)
Hemoglobin: 13.7 g/dL (ref 12.0–15.0)
MCH: 30.9 pg (ref 26.0–34.0)
MCHC: 33.3 g/dL (ref 30.0–36.0)
MCV: 92.6 fL (ref 80.0–100.0)
Platelets: 256 10*3/uL (ref 150–400)
RBC: 4.44 MIL/uL (ref 3.87–5.11)
RDW: 14.4 % (ref 11.5–15.5)
WBC: 12 10*3/uL — ABNORMAL HIGH (ref 4.0–10.5)
nRBC: 0 % (ref 0.0–0.2)

## 2023-05-09 LAB — CBG MONITORING, ED
Glucose-Capillary: 135 mg/dL — ABNORMAL HIGH (ref 70–99)
Glucose-Capillary: 108 mg/dL — ABNORMAL HIGH (ref 70–99)

## 2023-05-09 LAB — HEMOGLOBIN A1C
Hgb A1c MFr Bld: 5.4 % (ref 4.8–5.6)
Mean Plasma Glucose: 108.28 mg/dL

## 2023-05-09 LAB — HEPARIN LEVEL (UNFRACTIONATED)
Heparin Unfractionated: <0.1 [IU]/mL — ABNORMAL LOW (ref 0.30–0.70)
Heparin Unfractionated: 0.12 [IU]/mL — ABNORMAL LOW (ref 0.30–0.70)
Heparin Unfractionated: 0.21 [IU]/mL — ABNORMAL LOW (ref 0.30–0.70)

## 2023-05-09 LAB — URINALYSIS, COMPLETE (UACMP) WITH MICROSCOPIC
Bacteria, UA: NONE SEEN
Bilirubin Urine: NEGATIVE
Glucose, UA: NEGATIVE mg/dL
Hgb urine dipstick: NEGATIVE
Ketones, ur: NEGATIVE mg/dL
Leukocytes,Ua: NEGATIVE
Nitrite: NEGATIVE
Protein, ur: NEGATIVE mg/dL
Specific Gravity, Urine: 1.004 — ABNORMAL LOW (ref 1.005–1.030)
pH: 7 (ref 5.0–8.0)

## 2023-05-09 LAB — COMPREHENSIVE METABOLIC PANEL
ALT: 39 U/L (ref 0–44)
AST: 56 U/L — ABNORMAL HIGH (ref 15–41)
Albumin: 4 g/dL (ref 3.5–5.0)
Alkaline Phosphatase: 42 U/L (ref 38–126)
Anion gap: 9 (ref 5–15)
BUN: 32 mg/dL — ABNORMAL HIGH (ref 8–23)
CO2: 21 mmol/L — ABNORMAL LOW (ref 22–32)
Calcium: 9.1 mg/dL (ref 8.9–10.3)
Chloride: 104 mmol/L (ref 98–111)
Creatinine, Ser: 0.9 mg/dL (ref 0.44–1.00)
GFR, Estimated: 58 mL/min — ABNORMAL LOW (ref >60)
Glucose, Bld: 146 mg/dL — ABNORMAL HIGH (ref 70–99)
Potassium: 3.6 mmol/L (ref 3.5–5.1)
Sodium: 134 mmol/L — ABNORMAL LOW (ref 135–145)
Total Bilirubin: 0.4 mg/dL (ref 0.0–1.2)
Total Protein: 6.4 g/dL — ABNORMAL LOW (ref 6.5–8.1)

## 2023-05-09 LAB — D-DIMER, QUANTITATIVE: D-Dimer, Quant: 0.49 ug{FEU}/mL (ref 0.00–0.50)

## 2023-05-09 LAB — APTT: aPTT: 39 s — ABNORMAL HIGH (ref 24–36)

## 2023-05-09 LAB — GLUCOSE, CAPILLARY
Glucose-Capillary: 124 mg/dL — ABNORMAL HIGH (ref 70–99)
Glucose-Capillary: 117 mg/dL — ABNORMAL HIGH (ref 70–99)

## 2023-05-09 MED ORDER — LEVOTHYROXINE SODIUM 50 MCG PO TABS
50.0000 ug | ORAL_TABLET | Freq: Every day | ORAL | Status: DC
  Administered 2023-05-10 – 2023-05-13 (×4): 50 ug via ORAL
  Filled 2023-05-09 (×4): qty 1

## 2023-05-09 MED ORDER — ASPIRIN 325 MG PO TABS
325.0000 mg | ORAL_TABLET | Freq: Every day | ORAL | Status: DC
  Administered 2023-05-09: 325 mg via ORAL
  Filled 2023-05-09: qty 1

## 2023-05-09 MED ORDER — CLOPIDOGREL BISULFATE 300 MG PO TABS
300.0000 mg | ORAL_TABLET | Freq: Once | ORAL | Status: AC
  Administered 2023-05-09: 300 mg via ORAL
  Filled 2023-05-09: qty 1

## 2023-05-09 MED ORDER — ACETAMINOPHEN 650 MG RE SUPP: 650.0000 mg | Freq: Four times a day (QID) | RECTAL | Status: DC | PRN

## 2023-05-09 MED ORDER — LEVALBUTEROL HCL 0.63 MG/3ML IN NEBU: 0.6300 mg | INHALATION_SOLUTION | Freq: Four times a day (QID) | RESPIRATORY_TRACT | Status: DC | PRN

## 2023-05-09 MED ORDER — NITROGLYCERIN 0.4 MG SL SUBL
0.4000 mg | SUBLINGUAL_TABLET | SUBLINGUAL | Status: DC | PRN
  Administered 2023-05-10 (×2): 0.4 mg via SUBLINGUAL
  Filled 2023-05-09 (×2): qty 1

## 2023-05-09 MED ORDER — CARVEDILOL 6.25 MG PO TABS
6.2500 mg | ORAL_TABLET | Freq: Two times a day (BID) | ORAL | Status: DC
  Administered 2023-05-09 – 2023-05-10 (×2): 6.25 mg via ORAL
  Filled 2023-05-09 (×2): qty 1

## 2023-05-09 MED ORDER — ACETAMINOPHEN 325 MG PO TABS
650.0000 mg | ORAL_TABLET | Freq: Four times a day (QID) | ORAL | Status: DC | PRN
  Administered 2023-05-09 – 2023-05-13 (×6): 650 mg via ORAL
  Filled 2023-05-09 (×7): qty 2

## 2023-05-09 MED ORDER — ISOSORBIDE MONONITRATE ER 30 MG PO TB24
30.0000 mg | ORAL_TABLET | Freq: Every day | ORAL | Status: DC
  Administered 2023-05-10 – 2023-05-13 (×4): 30 mg via ORAL
  Filled 2023-05-09 (×4): qty 1

## 2023-05-09 MED ORDER — LOSARTAN POTASSIUM 25 MG PO TABS
12.5000 mg | ORAL_TABLET | Freq: Every day | ORAL | Status: DC
  Administered 2023-05-10 – 2023-05-13 (×4): 12.5 mg via ORAL
  Filled 2023-05-09 (×4): qty 1

## 2023-05-09 MED ORDER — ASPIRIN 81 MG PO TBEC
81.0000 mg | DELAYED_RELEASE_TABLET | Freq: Every day | ORAL | Status: DC
  Administered 2023-05-10 – 2023-05-13 (×4): 81 mg via ORAL
  Filled 2023-05-09 (×4): qty 1

## 2023-05-09 MED ORDER — HEPARIN BOLUS VIA INFUSION
2000.0000 [IU] | Freq: Once | INTRAVENOUS | Status: AC
  Administered 2023-05-09: 2000 [IU] via INTRAVENOUS
  Filled 2023-05-09: qty 2000

## 2023-05-09 MED ORDER — ROSUVASTATIN CALCIUM 5 MG PO TABS
10.0000 mg | ORAL_TABLET | Freq: Every day | ORAL | Status: DC
  Administered 2023-05-09: 10 mg via ORAL
  Filled 2023-05-09: qty 2

## 2023-05-09 MED ORDER — CLOPIDOGREL BISULFATE 75 MG PO TABS
75.0000 mg | ORAL_TABLET | Freq: Every day | ORAL | Status: DC
Start: 2023-05-10 — End: 2023-05-12
  Administered 2023-05-10 – 2023-05-12 (×3): 75 mg via ORAL
  Filled 2023-05-09 (×3): qty 1

## 2023-05-09 MED ORDER — HEPARIN (PORCINE) 25000 UT/250ML-% IV SOLN
1100.0000 [IU]/h | INTRAVENOUS | Status: DC
  Administered 2023-05-09: 750 [IU]/h via INTRAVENOUS
  Administered 2023-05-10: 1000 [IU]/h via INTRAVENOUS
  Administered 2023-05-11: 1100 [IU]/h via INTRAVENOUS
  Filled 2023-05-09 (×3): qty 250

## 2023-05-09 MED ORDER — FUROSEMIDE 10 MG/ML IJ SOLN
40.0000 mg | Freq: Two times a day (BID) | INTRAMUSCULAR | Status: DC
  Administered 2023-05-09: 40 mg via INTRAVENOUS
  Filled 2023-05-09: qty 4

## 2023-05-09 MED ORDER — FUROSEMIDE 10 MG/ML IJ SOLN
40.0000 mg | Freq: Two times a day (BID) | INTRAMUSCULAR | Status: AC
  Administered 2023-05-09 – 2023-05-10 (×2): 40 mg via INTRAVENOUS
  Filled 2023-05-09 (×3): qty 4

## 2023-05-09 MED ORDER — PROMETHAZINE HCL 12.5 MG PO TABS
12.5000 mg | ORAL_TABLET | Freq: Four times a day (QID) | ORAL | Status: DC | PRN
  Administered 2023-05-12: 12.5 mg via ORAL
  Filled 2023-05-09 (×2): qty 1

## 2023-05-09 MED ORDER — MELATONIN 5 MG PO TABS
5.0000 mg | ORAL_TABLET | Freq: Every evening | ORAL | Status: DC | PRN
  Administered 2023-05-09 – 2023-05-12 (×4): 5 mg via ORAL
  Filled 2023-05-09 (×4): qty 1

## 2023-05-09 NOTE — H&P (Addendum)
 History and Physical    AJAI TERHAAR ZOX:[id number] DOB: June 28, 1923 DOA: 05/08/2023  PCP: Deloris Ping, MD  Patient coming from: home  I have personally briefly reviewed patient's old medical records in [facility]  Chief Complaint: sob and chest discomfort  HPI: Vicki [name] is a 89 y.o. female with medical history significant of  HTN, Hypothyroidism, GERD, HLD, rheumatoid arthritis, CKD 3b, HFrEF, recent NSTEMI in 01/2023  at which time cardiology recommended cardiac cath however patient declined and opted only for medical management  Patient now presents to ED with sob and chest discomfort. Per patient  chest pain sob started acutely at around 4 pm and continued to progress due to this she presented to ED as she was concerned for her heart as she has recently had MI less than 3 months ago. Patient also noted associated radiation of pain down her left arm.  She however denied any associated fever/chills/ n/v/ diaphoresis.  ED Course:  In ED  Vitals:  afeb, bp 152/88, hr 95, rr 24, sat 93% onr a  EKG: LBBB unchanged from prior  Wbc 13.2, hgb 14, plt285 BNP 1228.6 Na 132( 136), K 4.1, CL97, K 4.1, CL97,  bicarb 20, glu 254, cr 1.09 CE 81,1914 RVP -neg Cxr IMPRESSION: 1. Cardiomegaly with increased central interstitial markings bilaterally, likely pulmonary edema. 2. Patchy infrahilar opacities bilaterally may represent atelectasis or pneumonia. D-dimer 0.49  Patient initially soley thought to have CHF exacerbation however CE trend noted significant  delta of2900. At that junction cardiology was consulted patient was started on heparin drip for treatment of  ACS. It appears sob most likely anginal equivalent.  Tx lasix 40mg  iv x 1   Review of Systems: As per HPI otherwise 10 point review of systems negative.   Past Medical History:  Diagnosis Date   Hypertension    Hypothyroidism     History reviewed. No pertinent surgical history.   reports that she  has never smoked. She has never used smokeless tobacco. She reports that she does not drink alcohol and does not use drugs.  Allergies  Allergen Reactions   Codeine Nausea Only    Makes her crazy Hallucinations    Latex Itching   Penicillins Rash    YEAST INFECTION  YEAST INFECTION   Sulfa Antibiotics Anaphylaxis and Other (See Comments)    Heart stops   Thiopental Anaphylaxis and Rash    Other reaction(s): Unknown  CARDIAC ARREST  CARDIAC ARREST    History reviewed. No pertinent family history.  Prior to Admission medications   Medication Sig Start Date End Date Taking? Authorizing Provider  aspirin EC 81 MG tablet Take 81 mg by mouth daily. Swallow whole.    [provider]  Biotin 10 MG CAPS Take 10 mg by mouth daily.    [provider]  Calcium Carb-Cholecalciferol (CALCIUM 600/VITAMIN D PO) Take 1 tablet by mouth daily.    [provider]  carvedilol (COREG) 6.25 MG tablet Take 6.25 mg by mouth 2 (two) times daily with a meal. 01/06/23   [provider]  clotrimazole-betamethasone (LOTRISONE) cream Apply 1 Application topically 2 (two) times daily. 11/18/20 11/05/23  [provider]  feeding supplement (ENSURE ENLIVE / ENSURE PLUS) LIQD Take 237 mLs by mouth 2 (two) times daily between meals. 02/09/23   Narda Bonds, MD  furosemide (LASIX) 20 MG tablet Take 20 mg by mouth daily. 09/13/20   [provider]  levothyroxine (SYNTHROID) 50 MCG tablet Take  50 mcg by mouth daily. 09/05/13   [provider]  losartan (COZAAR) 25 MG tablet Take 0.5 tablets (12.5 mg total) by mouth daily. 02/10/23 05/11/23  Narda Bonds, MD  METAMUCIL FIBER PO Take 1 Capful by mouth at bedtime.    [provider]  Multiple Vitamins-Minerals (THERA-M) TABS Take by mouth.    [provider]  Omega-3 Fatty Acids (FISH OIL) 1000 MG CAPS Take 1,000 mg by mouth daily.    [provider]  omeprazole (PRILOSEC) 20 MG capsule  Take 20 mg by mouth daily.    [provider]  rosuvastatin (CRESTOR) 10 MG tablet Take 1 tablet (10 mg total) by mouth daily. 02/10/23 05/11/23  Narda Bonds, MD    Physical Exam: Vitals:   05/08/23 2210 05/08/23 2245 05/08/23 2345 05/09/23 0045  BP: (!) 145/83 137/89 115/76 123/76  Pulse: 92 89 87 86  Resp: (!) 23 (!) 21 14 18   Temp: (!) 97.4 F (36.3 C)     TempSrc: Oral     SpO2: 95% 95% 94% 93%  Weight:      Height:        Constitutional: NAD, calm, comfortable Vitals:   05/08/23 2210 05/08/23 2245 05/08/23 2345 05/09/23 0045  BP: (!) 145/83 137/89 115/76 123/76  Pulse: 92 89 87 86  Resp: (!) 23 (!) 21 14 18   Temp: (!) 97.4 F (36.3 C)     TempSrc: Oral     SpO2: 95% 95% 94% 93%  Weight:      Height:       Eyes: PERRL, lids and conjunctivae normal ENMT: Mucous membranes are moist. Posterior pharynx clear of any exudate or lesions.Normal dentition.  Neck: normal, supple, no masses, no thyromegaly Respiratory: clear to auscultation bilaterally, no wheezing, no crackles. Normal respiratory effort. No accessory muscle use.  Cardiovascular: Regular rate and rhythm, no murmurs / rubs / gallops. No extremity edema. 2+ pedal pulses. Abdomen: no tenderness, no masses palpated. No hepatosplenomegaly. Bowel sounds positive.  Musculoskeletal: no clubbing / cyanosis. No joint deformity upper and lower extremities. Good ROM, no contractures. Normal muscle tone.  Skin: no rashes, lesions, ulcers. No induration Neurologic: CN 2-12 grossly intact. Sensation intact, DTR normal. Strength 5/5 in all 4.  Psychiatric: Normal judgment and insight. Alert and oriented x 3. Normal mood.    Labs on Admission: I have personally reviewed following labs and imaging studies  CBC: Recent Labs  Lab 05/08/23 2006  WBC 13.2*  HGB 14.0  HCT 42.8  MCV 95.1  PLT 285   Basic Metabolic Panel: Recent Labs  Lab 05/08/23 2006  NA 132*  K 4.1  CL 97*  CO2 20*  GLUCOSE 254*  BUN  36*  CREATININE 1.09*  CALCIUM 9.7   GFR: Estimated Creatinine Clearance: 26.2 mL/min (A) (by C-G formula based on SCr of 1.09 mg/dL (H)). Liver Function Tests: No results for input(s): "AST", "ALT", "ALKPHOS", "BILITOT", "PROT", "ALBUMIN" in the last 168 hours. No results for input(s): "LIPASE", "AMYLASE" in the last 168 hours. No results for input(s): "AMMONIA" in the last 168 hours. Coagulation Profile: No results for input(s): "INR", "PROTIME" in the last 168 hours. Cardiac Enzymes: No results for input(s): "CKTOTAL", "CKMB", "CKMBINDEX", "TROPONINI" in the last 168 hours. BNP (last 3 results) No results for input(s): "PROBNP" in the last 8760 hours. HbA1C: No results for input(s): "HGBA1C" in the last 72 hours. CBG: No results for input(s): "GLUCAP" in the last 168 hours. Lipid Profile: No results  for input(s): "CHOL", "HDL", "LDLCALC", "TRIG", "CHOLHDL", "LDLDIRECT" in the last 72 hours. Thyroid Function Tests: No results for input(s): "TSH", "T4TOTAL", "FREET4", "T3FREE", "THYROIDAB" in the last 72 hours. Anemia Panel: No results for input(s): "VITAMINB12", "FOLATE", "FERRITIN", "TIBC", "IRON", "RETICCTPCT" in the last 72 hours. Urine analysis:    Component Value Date/Time   COLORURINE STRAW (A) 02/06/2023 2041   APPEARANCEUR CLEAR 02/06/2023 2041   LABSPEC 1.006 02/06/2023 2041   PHURINE 7.0 02/06/2023 2041   GLUCOSEU NEGATIVE 02/06/2023 2041   HGBUR NEGATIVE 02/06/2023 2041   BILIRUBINUR NEGATIVE 02/06/2023 2041   KETONESUR NEGATIVE 02/06/2023 2041   PROTEINUR NEGATIVE 02/06/2023 2041   NITRITE NEGATIVE 02/06/2023 2041   LEUKOCYTESUR NEGATIVE 02/06/2023 2041    Radiological Exams on Admission: DG Chest Port 1 View Result Date: 05/08/2023 CLINICAL DATA:  Chest pain EXAM: PORTABLE CHEST 1 VIEW COMPARISON:  Chest x-ray 02/06/2023 FINDINGS: The heart is enlarged. There are increased central interstitial markings bilaterally. There are patchy infrahilar opacities  bilaterally. No pleural effusion or pneumothorax visualized. No acute fractures. IMPRESSION: 1. Cardiomegaly with increased central interstitial markings bilaterally, likely pulmonary edema. 2. Patchy infrahilar opacities bilaterally may represent atelectasis or pneumonia. Electronically Signed   By: Darliss Cheney M.D.   On: 05/08/2023 22:21    EKG: Independently reviewed. See above  Assessment/Plan    NSTEMI Hx of CAD s/p NSTEMI 12/24 -treated medically  -insetting of partial compliance with plavix -admit to progressive care  - continue with heparin drip  - asa, high intensity statin, beta blocker  as tolerated -lipid panel, A1c pending  -per cardiology load with plavix now and resume -patient currently has declined cath  -await further cardiology recs   Leukocytosis -thought to be stress response - infectious work up neg thus far -will continue to monitor   Acute on chronic combined systolic and diastolic heart failure -lasix 40 mg iv bid  -strict I/o  -noted cxr with pulmonary edema  - monitor closely on diuresis   Primary hypertension -Continue Coreg as bp tolerates   Hyperlipidemia Continue Crestor   Hypothyroidism Continue Synthroid.   DVT prophylaxis: heparin drip Code Status: full/ as discussed per patient wishes in event of cardiac arrest  Family Communication: none at bedside Disposition Plan: patient  expected to be admitted greater than 2 midnights  Consults called: Cardiology  Admission status: progressive   Lurline Del MD [facility]   If 7PM-7AM, please contact night-coverage [email or web address] Password [email or web address]  05/09/2023, 1:13 AM

## 2023-05-09 NOTE — Consult Note (Signed)
 Cardiology Consultation   Patient ID: Vicki [name] MRN: [id number]; DOB: [birth year]  Admit date: 05/08/2023 Date of Consult: 05/09/2023  PCP:  Deloris Ping, MD   [facility] Providers Cardiologist:  None        HPI:  Vicki [name] is a 89 y.o. female with a hx of HTN, Hypothyroidism, GERD, HLD, rheumatoid arthritis, CKD 3b, HFmrEF (EF 40-45%; 01/2023), recent NSTEMI in 01/2023 who is being seen on 05/09/2023 for the evaluation of NSTEMI at the request of hospital medicine.  Patient recently was admitted for an NSTEMI in December 2024. At that time she declined cardiac catheterization and was sent home on beta-blockers and nitroglycerin. She was recently seen by outpatient cardiology on 04/26/2023 -she reported her symptoms during her prior NSTEMIs were GERD like symptoms; she continued to have nausea/epigastric discomfort in the afternoon that improves with ginger ale and antacid. TTE demonstrated LVEF improved to 45-50%. She was recommended to start low-dose imdur to her GDMT.   Current medications include imdur 30mg  every day, asa 81mg  every day, losartan 12.5mg  every day, rosuvastatin 10mg  every day, carvedilol 6.25mg  bid. Patient unable to recall her cardiac meds; requested to confirm with daughter. She reports taking clopidogrel, however, her fill history indicates she was last prescribed clopidogrel 75mg  every day on 01/06/23 x 90 tabs (Expected refill needed by 04/06/23).   Patient initially presented with left sided chest pain with associated shortness of breath starting at 4pm. She was unable to provide further description of her symptoms. She reports adherence to her lasix, but did not urinate as much last night and took an extra dose of lasix. She denies any fevers, chills, productive cough.   ER course:  Initial vitals: BP 145/83, HR 92, RR 23, Sat 95%  Labs: Trop 66 -> 2968, BNP 1228, WBC 13.2, Hgb 14.0, plt 285, Na 132, K 4.1, CO2 20, Glu 254, Bun 36, Cr 1.09  (baseline 0.9-1.02); GFR 46, COVID/Flu negative,  CXR: 1. Cardiomegaly with increased central interstitial markings bilaterally, likely pulmonary edema. 2. Patchy infrahilar opacities bilaterally may represent atelectasis or pneumonia. EKG: NSR with PVCs, LBBB, does not meet sgarbossa's criteria   PMH:   Past Medical History:  Diagnosis Date   Hypertension    Hypothyroidism     History reviewed. No pertinent surgical history.   Home Medications:  Prior to Admission medications   Medication Sig Start Date End Date Taking? Authorizing Provider  aspirin EC 81 MG tablet Take 81 mg by mouth daily. Swallow whole.    [provider]  Biotin 10 MG CAPS Take 10 mg by mouth daily.    [provider]  Calcium Carb-Cholecalciferol (CALCIUM 600/VITAMIN D PO) Take 1 tablet by mouth daily.    [provider]  carvedilol (COREG) 6.25 MG tablet Take 6.25 mg by mouth 2 (two) times daily with a meal. 01/06/23   [provider]  clotrimazole-betamethasone (LOTRISONE) cream Apply 1 Application topically 2 (two) times daily. 11/18/20 11/05/23  [provider]  feeding supplement (ENSURE ENLIVE / ENSURE PLUS) LIQD Take 237 mLs by mouth 2 (two) times daily between meals. 02/09/23   Narda Bonds, MD  furosemide (LASIX) 20 MG tablet Take 20 mg by mouth daily. 09/13/20   [provider]  levothyroxine (SYNTHROID) 50 MCG tablet Take 50 mcg by mouth daily. 09/05/13   [provider]  losartan (COZAAR) 25 MG tablet Take 0.5 tablets (12.5 mg total) by mouth daily. 02/10/23 05/11/23  Jacquelin Hawking  A, MD  METAMUCIL FIBER PO Take 1 Capful by mouth at bedtime.    [provider]  Multiple Vitamins-Minerals (THERA-M) TABS Take by mouth.    [provider]  Omega-3 Fatty Acids (FISH OIL) 1000 MG CAPS Take 1,000 mg by mouth daily.    [provider]  omeprazole (PRILOSEC) 20 MG capsule Take 20 mg by mouth daily.    [provider]   rosuvastatin (CRESTOR) 10 MG tablet Take 1 tablet (10 mg total) by mouth daily. 02/10/23 05/11/23  Narda Bonds, MD    Inpatient Medications: Scheduled Meds:  aspirin  325 mg Oral Daily   furosemide  40 mg Intravenous Q12H   Continuous Infusions:  heparin 750 Units/hr (05/09/23 0109)   PRN Meds: acetaminophen **OR** acetaminophen, levalbuterol, nitroGLYCERIN, promethazine  Allergies:    Allergies  Allergen Reactions   Codeine Nausea Only    Makes her crazy Hallucinations    Latex Itching   Penicillins Rash    YEAST INFECTION  YEAST INFECTION   Sulfa Antibiotics Anaphylaxis and Other (See Comments)    Heart stops   Thiopental Anaphylaxis and Rash    Other reaction(s): Unknown  CARDIAC ARREST  CARDIAC ARREST    Social History:   Social History   Socioeconomic History   Marital status: Widowed    Spouse name: Not on file   Number of children: Not on file   Years of education: Not on file   Highest education level: Not on file  Occupational History   Not on file  Tobacco Use   Smoking status: Never   Smokeless tobacco: Never  Substance and Sexual Activity   Alcohol use: Never   Drug use: Never   Sexual activity: Not on file  Other Topics Concern   Not on file  Social History Narrative   Not on file   Social Drivers of Health   Financial Resource Strain: Low Risk  (04/26/2023)   Received from [facility]   Overall Financial Resource Strain (CARDIA)    Difficulty of Paying Living Expenses: Not hard at all  Food Insecurity: No Food Insecurity (04/26/2023)   Received from [facility]   Hunger Vital Sign    Worried About Running Out of Food in the Last Year: Never true    Ran Out of Food in the Last Year: Never true  Transportation Needs: No Transportation Needs (04/26/2023)   Received from [facility] - Transportation    Lack of Transportation (Medical): No    Lack of Transportation (Non-Medical): No  Physical Activity: Unknown  (09/06/2022)   Received from [facility]   Exercise Vital Sign    Days of Exercise per Week: 0 days    Minutes of Exercise per Session: Not on file  Stress: No Stress Concern Present (01/02/2023)   Received from [facility] - Occupational Stress Questionnaire    Feeling of Stress : Not at all  Social Connections: Socially Integrated (09/06/2022)   Received from [facility]   Social Network    How would you rate your social network (family, work, friends)?: Good participation with social networks  Intimate Partner Violence: Not At Risk (02/07/2023)   Humiliation, Afraid, Rape, and Kick questionnaire    Fear of Current or Ex-Partner: No    Emotionally Abused: No    Physically Abused: No    Sexually Abused: No    Family History:   History reviewed. No pertinent family history.  ROS:  Please see the history of present illness.   All other ROS reviewed and negative.      Physical Exam/Data:   Vitals:   05/09/23 0045 05/09/23 0130 05/09/23 0401 05/09/23 0425  BP: 123/76 124/68 122/85 118/66  Pulse: 86 85  (!) 46  Resp: 18 (!) 29  17  Temp:    97.8 F (36.6 C)  TempSrc:    Oral  SpO2: 93% 94%  93%  Weight:      Height:       No intake or output data in the 24 hours ending 05/09/23 0501    05/08/2023    7:58 PM 02/09/2023    5:36 AM 02/08/2023    4:52 AM  Last 3 Weights  Weight (lbs) 144 lb 141 lb 12.1 oz 142 lb 8 oz  Weight (kg) 65.318 kg 64.3 kg 64.638 kg     Body mass index is 26.34 kg/m.  General:  pleasant, conversant, in no acute distress HEENT: normal Neck: no JVD Vascular: No carotid bruits; Distal pulses 2+ bilaterally Cardiac:  normal S1, S2; RRR; 2/6 SEM heard best at RUSB Lungs:  clear to auscultation bilaterally, no wheezing, rhonchi or rales  Abd: soft, nontender, no hepatomegaly  Ext: trace bilateral LE edema  Musculoskeletal:  No deformities, BUE and BLE strength normal and equal Skin: warm and dry   Neuro:  o focal abnormalities noted Psych:  Normal affect   Relevant CV Studies: EKG 02/06/23: NSR with PVCs, LBBB, does not meet sgarbossa's criteria   EKG 05/08/23    TTE 02/08/23  1. Left ventricular ejection fraction, by estimation, is 40 to 45%. The left ventricle has mildly decreased function. The left ventricle  demonstrates global hypokinesis. The left ventricular internal cavity size  was moderately dilated. Left ventricular diastolic parameters are consistent with Grade II diastolic dysfunction (pseudonormalization). Elevated left ventricular end-diastolic pressure.    2. Right ventricular systolic function is normal. The right ventricular size is normal. There is mildly elevated pulmonary artery systolic pressure.   3. Left atrial size was moderately dilated.   4. The mitral valve is abnormal. Moderate mitral valve regurgitation. No evidence of mitral stenosis. Moderate mitral annular calcification.   5. The aortic valve was not well visualized. There is severe calcifcation of the aortic valve. There is severe thickening of the aortic valve. Aortic valve regurgitation is trivial. Mild aortic valve stenosis.   6. The inferior vena cava is dilated in size with >50% respiratory variability, suggesting right atrial pressure of 8 mmHg.    Laboratory Data:  High Sensitivity Troponin:   Recent Labs  Lab 05/08/23 2006 05/08/23 2224  TROPONINIHS 66* 2,968*     Chemistry Recent Labs  Lab 05/08/23 2006  NA 132*  K 4.1  CL 97*  CO2 20*  GLUCOSE 254*  BUN 36*  CREATININE 1.09*  CALCIUM 9.7  GFRNONAA 46*  ANIONGAP 15    No results for input(s): "PROT", "ALBUMIN", "AST", "ALT", "ALKPHOS", "BILITOT" in the last 168 hours. Lipids No results for input(s): "CHOL", "TRIG", "HDL", "LABVLDL", "LDLCALC", "CHOLHDL" in the last 168 hours.  Hematology Recent Labs  Lab 05/08/23 2006  WBC 13.2*  RBC 4.50  HGB 14.0  HCT 42.8  MCV 95.1  MCH 31.1  MCHC 32.7  RDW 14.4  PLT 285    Thyroid No results for input(s): "TSH", "FREET4" in the last 168 hours.  BNP Recent Labs  Lab 05/08/23 2006  BNP 1,228.6*    DDimer  Recent  Labs  Lab 05/09/23 0045  DDIMER 0.49   Radiology/Studies:  [facility] Chest Port 1 View Result Date: 05/08/2023 CLINICAL DATA:  Chest pain EXAM: PORTABLE CHEST 1 VIEW COMPARISON:  Chest x-ray 02/06/2023 FINDINGS: The heart is enlarged. There are increased central interstitial markings bilaterally. There are patchy infrahilar opacities bilaterally. No pleural effusion or pneumothorax visualized. No acute fractures. IMPRESSION: 1. Cardiomegaly with increased central interstitial markings bilaterally, likely pulmonary edema. 2. Patchy infrahilar opacities bilaterally may represent atelectasis or pneumonia. Electronically Signed   By: Darliss Cheney M.D.   On: 05/08/2023 22:21   Assessment and Plan:   #NSTEMI Patient is a 89yo F with a history of HTN, HLD, RA, CKD, HFrEF (EF 45-50%) with prior NSTEMI in 01/2023 at which time she declined cardiac catheterization and was treated medically with asa/plavix. Of note, she likely ran-out of plavix around 04/06/23 given her fill history. She presented with acute left sided chest pain and associated shortness of breath; Trop 66 -> 2968, BNP 1228; CXR with mild pulmonary edema, EKG in NSR with PVCs and LBBB not meeting sgarbossa's criteria. Discussed with patient, she is still not interested in cardiac catheterization at this time given her age, but also wanted to talk to her son. In this setting, will continue to re-optimize medical therapy and restart plavix.  --load asa 325mg  then 81mg  every day --reload clopidogrel 300mg  every day, then 75mg  every day  --continue heparin gtt --continue rosuvastatin 10mg  every day (previously stopped atorvastatin 80mg  ) --continue imdur 30mg  every day --continue losartan 12.5mg  every day --continue carvedilol 6.25mg  bid  --trend troponin to peak  --obtain TTE --confirm wishes to not  pursue LHC with patient/son in AM  Risk stratification --A1c, TSH, Lipid panel Monitoring --chest pain protocol; po nitroglycerin and EKG prn chest pain --maintain telemetry --goal K~4, Mag~2   Risk Assessment/Risk Scores:    TIMI Risk Score for Unstable Angina or Non-ST Elevation MI:   The patient's TIMI risk score is 5, which indicates a 26% risk of all cause mortality, new or recurrent myocardial infarction or need for urgent revascularization in the next 14 days.{  New York Heart Association (NYHA) Functional Class NYHA Class II    For questions or updates, please contact [facility] Please consult [email or web address] for contact info under    Signed, Rubye Oaks, MD  05/09/2023 5:01 AM

## 2023-05-09 NOTE — Plan of Care (Signed)
  Problem: Clinical Measurements: Goal: Will remain free from infection Outcome: Progressing Goal: Respiratory complications will improve Outcome: Progressing   Problem: Nutrition: Goal: Adequate nutrition will be maintained Outcome: Progressing   Problem: Elimination: Goal: Will not experience complications related to urinary retention Outcome: Progressing   Problem: Safety: Goal: Ability to remain free from injury will improve Outcome: Progressing   Problem: Coping: Goal: Level of anxiety will decrease Outcome: Not Progressing

## 2023-05-09 NOTE — Progress Notes (Signed)
 Follow up note, patient seen by overnight cardiology fellow early this AM   1.NSTEMI -prior NSTEMI in 01/2023 at which time she declined cardiac catheterization and was treated medically with asa/plavix.  - this ad mit presented with acute left sided chest pain and associated shortness of breath; Trop 66 -> 2968, BNP 1228   Patient declines cath again this admission.  Continue medical therapy as outlined by consultation note, 48 hrs of heparin.  F/u tomorrow AM, no additional recs today   Vicki Ferry MD

## 2023-05-09 NOTE — Progress Notes (Signed)
 PHARMACY - ANTICOAGULATION CONSULT NOTE  Pharmacy Consult for VI heparin Indication: chest pain/ACS  Allergies  Allergen Reactions   Codeine Nausea Only    Makes her crazy Hallucinations    Latex Itching   Penicillins Rash    YEAST INFECTION  YEAST INFECTION   Sulfa Antibiotics Anaphylaxis and Other (See Comments)    Heart stops   Thiopental Anaphylaxis and Rash    Other reaction(s): Unknown  CARDIAC ARREST  CARDIAC ARREST    Patient Measurements: Height: 5\' 2"  (157.5 cm) Weight: 65.3 kg (144 lb) IBW/kg (Calculated) : 50.1 Heparin Dosing Weight: 63.4 kg  Vital Signs: Temp: 97.4 F (36.3 C) (03/08 2210) Temp Source: Oral (03/08 2210) BP: 115/76 (03/08 2345) Pulse Rate: 87 (03/08 2345)  Labs: Recent Labs    05/08/23 2006 05/08/23 2224  HGB 14.0  --   HCT 42.8  --   PLT 285  --   CREATININE 1.09*  --   TROPONINIHS 66* 2,968*    Estimated Creatinine Clearance: 26.2 mL/min (A) (by C-G formula based on SCr of 1.09 mg/dL (H)).   Medical History: Past Medical History:  Diagnosis Date   Hypertension    Hypothyroidism     Assessment: Vicki [name] is a 89 y.o. year old female admitted on 05/08/2023 with concern for ACS. Troponin 66 > 2968. Previously on xarelto and patient is unsure of some of her medications. Pharmacy consulted to dose heparin.  Goal of Therapy:  Heparin level 0.3-0.7 units/ml aPTT 66-102 seconds Monitor platelets by anticoagulation protocol: Yes   Plan:  Heparin 2000 units x 1 as bolus followed by heparin infusion at 750 units/hr Will clarify medications with med rec and order a baseline anti-xa 8 aPTT  Daily aPTT, heparin level, CBC, and monitoring for bleeding F/u plans for anticoagulation   Thank you for allowing pharmacy to participate in this patient's care.  Marja Kays, PharmD Emergency Medicine Clinical Pharmacist 05/09/2023,12:33 AM

## 2023-05-09 NOTE — Progress Notes (Signed)
 PHARMACY - ANTICOAGULATION CONSULT NOTE  Pharmacy Consult for IV heparin Indication: chest pain/ACS  Allergies  Allergen Reactions   Codeine Nausea Only    Makes her crazy Hallucinations    Latex Itching   Penicillins Rash    YEAST INFECTION  YEAST INFECTION   Sulfa Antibiotics Anaphylaxis and Other (See Comments)    Heart stops   Thiopental Anaphylaxis and Rash    Other reaction(s): Unknown  CARDIAC ARREST  CARDIAC ARREST    Patient Measurements: Height: 5\' 2"  (157.5 cm) Weight: 65.3 kg (144 lb) IBW/kg (Calculated) : 50.1 Heparin Dosing Weight: 63.4 kg  Vital Signs: Temp: 97.9 F (36.6 C) (03/09 0817) Temp Source: Oral (03/09 0817) BP: 97/56 (03/09 1000) Pulse Rate: 74 (03/09 1000)  Labs: Recent Labs    05/08/23 2006 05/08/23 2224 05/09/23 0045 05/09/23 0513 05/09/23 1208  HGB 14.0  --   --  13.7  --   HCT 42.8  --   --  41.1  --   PLT 285  --   --  256  --   APTT  --   --   --   --  39*  HEPARINUNFRC  --   --  <0.10*  --  0.12*  CREATININE 1.09*  --   --  0.90  --   TROPONINIHS 66* 2,968*  --   --   --     Estimated Creatinine Clearance: 31.7 mL/min (by C-G formula based on SCr of 0.9 mg/dL).   Medical History: Past Medical History:  Diagnosis Date   Hypertension    Hypothyroidism     Assessment: KENNDRA MORRIS is a 89 y.o. year old female admitted on 05/08/2023 with concern for ACS. Troponin 66 > 2968. Previously on xarelto and patient is unsure of some of her medications. Pharmacy consulted to dose heparin.  Initial heparin level < 0.10, does not appear patient taking Xarelto PTA, last filled in September for 14 day supply. Heparin level 0.12 and aPTT 39 while on 750u/hr showing correlation. HgB 13.7 and PLTs 256. No pauses or issues noted with infusion.   Goal of Therapy:  Heparin level 0.3-0.7 units/ml aPTT 66-102 seconds Monitor platelets by anticoagulation protocol: Yes   Plan:  Increase heparin to 900u/hr  Transition to just heparin  levels, recheck in 8 hours.  Daily, heparin level, CBC, and monitoring for bleeding F/u longterm plans for anticoagulation, no LHC per family, pallative care cx.   Thank you for allowing pharmacy to participate in this patient's care.  Estill Batten, PharmD, BCCCP  Emergency Medicine Clinical Pharmacist 05/09/2023,12:45 PM

## 2023-05-09 NOTE — Progress Notes (Signed)
 PHARMACY - ANTICOAGULATION CONSULT NOTE  Pharmacy Consult for IV heparin Indication: chest pain/ACS  Allergies  Allergen Reactions   Codeine Nausea Only    Makes her crazy Hallucinations    Latex Itching   Penicillins Rash    YEAST INFECTION  YEAST INFECTION   Sulfa Antibiotics Anaphylaxis and Other (See Comments)    Heart stops   Thiopental Anaphylaxis and Rash    Other reaction(s): Unknown  CARDIAC ARREST  CARDIAC ARREST   Statins Itching    Itchy scalp with hair loss, confusion    Patient Measurements: Height: 5\' 2"  (157.5 cm) Weight: 68.8 kg (151 lb 10.8 oz) IBW/kg (Calculated) : 50.1 Heparin Dosing Weight: 63.4 kg  Vital Signs: Temp: 98.2 F (36.8 C) (03/09 1940) Temp Source: Oral (03/09 1940) BP: 131/83 (03/09 1635) Pulse Rate: 93 (03/09 1635)  Labs: Recent Labs    05/08/23 2006 05/08/23 2224 05/09/23 0045 05/09/23 0513 05/09/23 1208 05/09/23 2123  HGB 14.0  --   --  13.7  --   --   HCT 42.8  --   --  41.1  --   --   PLT 285  --   --  256  --   --   APTT  --   --   --   --  39*  --   HEPARINUNFRC  --   --  <0.10*  --  0.12* 0.21*  CREATININE 1.09*  --   --  0.90  --   --   TROPONINIHS 66* 2,968*  --   --   --   --     Estimated Creatinine Clearance: 32.5 mL/min (by C-G formula based on SCr of 0.9 mg/dL).   Medical History: Past Medical History:  Diagnosis Date   Hypertension    Hypothyroidism     Assessment: Vicki [name] is a 89 y.o. year old female admitted on 05/08/2023 with concern for ACS. Troponin 66 > 2968. Previously on xarelto and patient is unsure of some of her medications. Pharmacy consulted to dose heparin.  Initial heparin level < 0.10, does not appear patient taking Xarelto PTA, last filled in September for 14 day supply. Heparin level 0.12 and aPTT 39 while on 750u/hr showing correlation. HgB 13.7 and PLTs 256. No pauses or issues noted with infusion.   3/9 PM update:  Heparin level sub-therapeutic  Goal of Therapy:   Heparin level 0.3-0.7 units/mL Monitor platelets by anticoagulation protocol: Yes   Plan:  Increase heparin to 1000 units/hr Heparin level with AM labs  Abran Duke, PharmD, BCPS Clinical Pharmacist Phone: [phone]

## 2023-05-09 NOTE — ED Notes (Signed)
 Admitting MD notified of increased troponin. EKG captured. Pt denies chest pain at this time.

## 2023-05-09 NOTE — Consult Note (Signed)
 Consultation Note Date: 05/09/2023   Patient Name: Vicki [name]  DOB: 1923/08/18  MRN: [id number]  Age / Sex: 89 y.o., female  PCP: Vicki Ping, MD Referring Physician: Glade Lloyd, MD  Reason for Consultation: Establishing goals of care  HPI/Patient Profile: 89 y.o. female  with past medical history of HTN, Hypothyroidism, GERD, HLD, rheumatoid arthritis, CKD 3b, HFrEF, recent NSTEMI in 01/2023 admitted on 05/08/2023 with shortness of breath and chest discomfort.   Patient is admitted for another NSTEMI and acute on chronic CHF. PMT has been consulted to assist with goals of care conversation.  Clinical Assessment and Goals of Care:  I have reviewed medical records including EPIC notes, labs and imaging, discussed with RN, assessed the patient and then met at the bedside to discuss diagnosis prognosis, GOC, EOL wishes, disposition and options.  I introduced Palliative Medicine as specialized medical care for people living with serious illness. It focuses on providing relief from the symptoms and stress of a serious illness. The goal is to improve quality of life for both the patient and the family.  We discussed a brief life review of the patient and then focused on their current illness. The natural disease trajectory and expectations at EOL were discussed.  I attempted to elicit values and goals of care important to the patient.    Medical History Review and Understanding:  Patient has a good understanding of her acute and chronic medical illnesses.  She leans heavily on her daughter-in-law Vicki [name], who has some background in home health, to make medical decisions.  Social History: Patient has 2 sons, 1 who lives in [place] and 1 who lives locally.  She is also very close to her daughter-in-law Vicki [name].  She states that she lives at home alone and has a Comptroller with her daily from 6:30pm to  9:30am.  She enjoys praying and reading the Bible.  She is hard of hearing and uses hearing aids.  Functional and Nutritional State: She reports independence with ADLs and ambulating with a walker.  She uses a bedside commode.  She reports fixing herself small meals throughout the day.  Albumin of 4.0 noted on 3/9.  Palliative Symptoms: Patient denies any baseline symptoms  Advance Directives: A detailed discussion regarding advanced directives was had. Patient reports that her son and daughter-in-law are HCPOAs, no documentation on file.  Code Status: Concepts specific to code status, artifical feeding and hydration, and rehospitalization were considered and discussed.   Discussion: Patient is initially hesitant to speak with this PA, stating that she does not want to talk about dying.  She states that "someone came in here at 73 AM, 5AM, and then again this morning to talk about dying."  She is very spiritual and is at peace with her mortality. She is more open to discussing her quality of life.  She is very satisfied with how things are going, with the exceptions of the chest pain she gets with NSTEMI's.  She understands it is significant that this is the third time this has happened to her.  She feels that her latest heart attack is related to stress with recent losses of loved ones. Unfortunately, she just lost both a grandson and a friend last week.  She is having a difficult time with grief and wondering if she will be next to die.  She states that she would be ready to go if God says it is her time.  Emotional support therapeutic listening was provided. Patient states  that she would be agreeable to further hospitalizations because she "gets along with everyone."  She confirms that she would not want any procedures or interventions, though she is willing to receive curative medications.  She was strongly encouraged by her daughter-in-law to present for this admission.    Patient feels that  she would prefer a peaceful death whenever it is her time, however her family "wants her to live."  We discussed the importance of being on the same page and I encouraged her to have further conversations with her family members about her wishes. Offered to call to coordinate for additional discussion, however she prefers to hold off at this time as her family is already getting enough calls and she did not sleep very well last night.   Discussed the importance of continued conversation with family and the medical providers regarding overall plan of care and treatment options, ensuring decisions are within the context of the patient's values and GOCs.   Questions and concerns were addressed.  Hard Choices booklet left for review. The family was encouraged to call with questions or concerns.  PMT will continue to support holistically.   SUMMARY OF RECOMMENDATIONS   -Continue full code.  Patient seems more aligned with DNR/DNI, however she is deferring to her sons/DIL who desire full code -Continue gentle medical interventions, patient does not want any invasive procedures -Ongoing goals of care discussions.  She did not desire to involve her family today -Patient reports that her son and daughter-in-law are HCPOAs, will attempt to obtain documentation -Psychosocial and emotional support provided -PMT to continue to follow and support   Prognosis:  Unable to determine  Discharge Planning: To Be Determined      Primary Diagnoses: Present on Admission: **None**   Physical Exam Vitals and nursing note reviewed.  Constitutional:      General: She is not in acute distress. Cardiovascular:     Rate and Rhythm: Normal rate.  Pulmonary:     Effort: Pulmonary effort is normal. No respiratory distress.  Skin:    General: Skin is warm and dry.  Neurological:     Mental Status: She is alert. Mental status is at baseline.  Psychiatric:        Mood and Affect: Mood normal.        Behavior:  Behavior is cooperative.        Cognition and Memory: Cognition normal.    Vital Signs: BP (!) 97/56   Pulse 74   Temp 97.9 F (36.6 C) (Oral)   Resp 17   Ht 5\' 2"  (1.575 m)   Wt 65.3 kg   SpO2 91%   BMI 26.34 kg/m  Pain Scale: 0-10   Pain Score: 8    SpO2: SpO2: 91 % O2 Device:SpO2: 91 % O2 Flow Rate: .    Palliative Assessment/Data: 50-60%      Total time: I spent 75 minutes in the care of the patient today in the above activities and documenting the encounter.    Charlott Calvario Jeni Salles, PA-C  Palliative Medicine Team Team phone # [phone]  Thank you for allowing the Palliative Medicine Team to assist in the care of this patient. Please utilize secure chat with additional questions, if there is no response within 30 minutes please call the above phone number.  Palliative Medicine Team providers are available by phone from 7am to 7pm daily and can be reached through the team cell phone.  Should this patient require assistance outside of these hours,  please call the patient's attending physician.

## 2023-05-09 NOTE — Progress Notes (Signed)
 Patient admitted earlier this morning for shortness of breath and chest discomfort due to non-STEMI and possible CHF exacerbation and has been started on heparin drip along with IV Lasix.  Cardiology has been consulted.  Patient seen and examined at bedside and plan of care discussed with her.  I have reviewed patient's medical records including this morning's H&P, current vitals, labs and medications myself.  Follow further cardiology recommendations.  Consult palliative care for goals of care discussion.  Patient is currently full code.

## 2023-05-09 NOTE — Plan of Care (Signed)

## 2023-05-10 ENCOUNTER — Inpatient Hospital Stay (HOSPITAL_COMMUNITY)

## 2023-05-10 DIAGNOSIS — Z515 Encounter for palliative care: Secondary | ICD-10-CM | POA: Diagnosis not present

## 2023-05-10 DIAGNOSIS — I214 Non-ST elevation (NSTEMI) myocardial infarction: Secondary | ICD-10-CM

## 2023-05-10 DIAGNOSIS — I4891 Unspecified atrial fibrillation: Secondary | ICD-10-CM

## 2023-05-10 DIAGNOSIS — Z7189 Other specified counseling: Secondary | ICD-10-CM | POA: Diagnosis not present

## 2023-05-10 DIAGNOSIS — I480 Paroxysmal atrial fibrillation: Secondary | ICD-10-CM

## 2023-05-10 DIAGNOSIS — I5181 Takotsubo syndrome: Secondary | ICD-10-CM

## 2023-05-10 DIAGNOSIS — I5043 Acute on chronic combined systolic (congestive) and diastolic (congestive) heart failure: Secondary | ICD-10-CM | POA: Diagnosis not present

## 2023-05-10 DIAGNOSIS — I509 Heart failure, unspecified: Secondary | ICD-10-CM | POA: Diagnosis not present

## 2023-05-10 LAB — BASIC METABOLIC PANEL
Anion gap: 8 (ref 5–15)
BUN: 51 mg/dL — ABNORMAL HIGH (ref 8–23)
CO2: 23 mmol/L (ref 22–32)
Calcium: 8.5 mg/dL — ABNORMAL LOW (ref 8.9–10.3)
Chloride: 105 mmol/L (ref 98–111)
Creatinine, Ser: 0.95 mg/dL (ref 0.44–1.00)
GFR, Estimated: 54 mL/min — ABNORMAL LOW (ref >60)
Glucose, Bld: 118 mg/dL — ABNORMAL HIGH (ref 70–99)
Potassium: 3.5 mmol/L (ref 3.5–5.1)
Sodium: 136 mmol/L (ref 135–145)

## 2023-05-10 LAB — GLUCOSE, CAPILLARY
Glucose-Capillary: 110 mg/dL — ABNORMAL HIGH (ref 70–99)
Glucose-Capillary: 99 mg/dL (ref 70–99)
Glucose-Capillary: 131 mg/dL — ABNORMAL HIGH (ref 70–99)
Glucose-Capillary: 108 mg/dL — ABNORMAL HIGH (ref 70–99)

## 2023-05-10 LAB — ECHOCARDIOGRAM COMPLETE
AR max vel: 2.22 cm2
AV Area VTI: 2.82 cm2
AV Area mean vel: 1.94 cm2
AV Mean grad: 5 mmHg
AV Peak grad: 9 mmHg
Ao pk vel: 1.5 m/s
Area-P 1/2: 3.81 cm2
Calc EF: 46.6 %
Height: 62 in
MV M vel: 4.44 m/s
MV Peak grad: 78.9 mmHg
MV VTI: 2.95 cm2
Radius: 0.3 cm
S' Lateral: 4 cm
Single Plane A2C EF: 44.8 %
Single Plane A4C EF: 48.5 %
Weight: 2324.53 [oz_av]

## 2023-05-10 LAB — HEPARIN LEVEL (UNFRACTIONATED)
Heparin Unfractionated: 0.31 [IU]/mL (ref 0.30–0.70)
Heparin Unfractionated: 0.34 [IU]/mL (ref 0.30–0.70)

## 2023-05-10 LAB — CBC WITH DIFFERENTIAL/PLATELET
Abs Immature Granulocytes: 0.04 10*3/uL (ref 0.00–0.07)
Basophils Absolute: 0 10*3/uL (ref 0.0–0.1)
Basophils Relative: 0 %
Eosinophils Absolute: 0 10*3/uL (ref 0.0–0.5)
Eosinophils Relative: 0 %
HCT: 33.6 % — ABNORMAL LOW (ref 36.0–46.0)
Hemoglobin: 11.3 g/dL — ABNORMAL LOW (ref 12.0–15.0)
Immature Granulocytes: 0 %
Lymphocytes Relative: 25 %
Lymphs Abs: 2.9 10*3/uL (ref 0.7–4.0)
MCH: 31.1 pg (ref 26.0–34.0)
MCHC: 33.6 g/dL (ref 30.0–36.0)
MCV: 92.6 fL (ref 80.0–100.0)
Monocytes Absolute: 1.6 10*3/uL — ABNORMAL HIGH (ref 0.1–1.0)
Monocytes Relative: 14 %
Neutro Abs: 7 10*3/uL (ref 1.7–7.7)
Neutrophils Relative %: 61 %
Platelets: 222 10*3/uL (ref 150–400)
RBC: 3.63 MIL/uL — ABNORMAL LOW (ref 3.87–5.11)
RDW: 14.6 % (ref 11.5–15.5)
WBC: 11.5 10*3/uL — ABNORMAL HIGH (ref 4.0–10.5)
nRBC: 0 % (ref 0.0–0.2)

## 2023-05-10 LAB — MAGNESIUM: Magnesium: 2.2 mg/dL (ref 1.7–2.4)

## 2023-05-10 MED ORDER — AMIODARONE HCL 200 MG PO TABS
200.0000 mg | ORAL_TABLET | Freq: Two times a day (BID) | ORAL | Status: DC
  Administered 2023-05-11 – 2023-05-13 (×5): 200 mg via ORAL
  Filled 2023-05-10 (×5): qty 1

## 2023-05-10 MED ORDER — CARVEDILOL 3.125 MG PO TABS
3.1250 mg | ORAL_TABLET | Freq: Two times a day (BID) | ORAL | Status: DC
Start: 2023-05-11 — End: 2023-05-13
  Administered 2023-05-11 – 2023-05-13 (×4): 3.125 mg via ORAL
  Filled 2023-05-10 (×5): qty 1

## 2023-05-10 MED ORDER — ROSUVASTATIN CALCIUM 5 MG PO TABS
10.0000 mg | ORAL_TABLET | Freq: Every day | ORAL | Status: DC
  Administered 2023-05-11 – 2023-05-13 (×3): 10 mg via ORAL
  Filled 2023-05-10 (×3): qty 2

## 2023-05-10 MED ORDER — AMIODARONE HCL IN DEXTROSE 360-4.14 MG/200ML-% IV SOLN
30.0000 mg/h | INTRAVENOUS | Status: DC
  Filled 2023-05-10: qty 200

## 2023-05-10 MED ORDER — AMIODARONE HCL IN DEXTROSE 360-4.14 MG/200ML-% IV SOLN
60.0000 mg/h | INTRAVENOUS | Status: DC
  Administered 2023-05-10: 60 mg/h via INTRAVENOUS
  Filled 2023-05-10: qty 200

## 2023-05-10 MED ORDER — AMIODARONE LOAD VIA INFUSION
150.0000 mg | Freq: Once | INTRAVENOUS | Status: AC
  Administered 2023-05-10: 150 mg via INTRAVENOUS
  Filled 2023-05-10: qty 83.34

## 2023-05-10 MED ORDER — SODIUM CHLORIDE 0.9 % IV BOLUS
500.0000 mL | Freq: Once | INTRAVENOUS | Status: AC
  Administered 2023-05-10: 500 mL via INTRAVENOUS

## 2023-05-10 NOTE — Progress Notes (Signed)
 PHARMACY - ANTICOAGULATION CONSULT NOTE  Pharmacy Consult for IV heparin Indication: chest pain/ACS  Allergies  Allergen Reactions   Codeine Nausea Only    Makes her crazy Hallucinations    Latex Itching   Penicillins Rash    YEAST INFECTION  YEAST INFECTION   Sulfa Antibiotics Anaphylaxis and Other (See Comments)    Heart stops   Thiopental Anaphylaxis and Rash    Other reaction(s): Unknown  CARDIAC ARREST  CARDIAC ARREST   Statins Itching    Itchy scalp with hair loss, confusion    Patient Measurements: Height: 5\' 2"  (157.5 cm) Weight: 65.9 kg (145 lb 4.5 oz) IBW/kg (Calculated) : 50.1 Heparin Dosing Weight: 63.4 kg  Vital Signs: Temp: 97.9 F (36.6 C) (03/10 1600) Temp Source: Oral (03/10 1600) BP: 119/59 (03/10 1700) Pulse Rate: 76 (03/10 1700)  Labs: Recent Labs    05/08/23 2006 05/08/23 2224 05/09/23 0045 05/09/23 0513 05/09/23 1208 05/09/23 2123 05/10/23 0253 05/10/23 1541  HGB 14.0  --   --  13.7  --   --  11.3*  --   HCT 42.8  --   --  41.1  --   --  33.6*  --   PLT 285  --   --  256  --   --  222  --   APTT  --   --   --   --  39*  --   --   --   HEPARINUNFRC  --   --    < >  --  0.12* 0.21* 0.31 0.34  CREATININE 1.09*  --   --  0.90  --   --  0.95  --   TROPONINIHS 66* 2,968*  --   --   --   --   --   --    < > = values in this interval not displayed.    Estimated Creatinine Clearance: 30.1 mL/min (by C-G formula based on SCr of 0.95 mg/dL).   Medical History: Past Medical History:  Diagnosis Date   Hypertension    Hypothyroidism     Assessment: Vicki [name] is a 89 y.o. year old female admitted on 05/08/2023 with concern for ACS. Troponin 66 > 2968. Previously on xarelto and patient is unsure of some of her medications. Pharmacy consulted to dose heparin.  Does not appear patient taking Xarelto PTA, last filled in September for 14 day supply.   3/10 PM Heparin level low end of therapeutic goal at 0.34 on 1050 units/hr. Hgb 13.7 >  11.3, plt wnl. No signs of bleeding or issues with infusion per RN.    Goal of Therapy:  Heparin level 0.3-0.7 units/mL Monitor platelets by anticoagulation protocol: Yes   Plan:  Increase heparin slightly to 1100 units/hr Daily heparin level and CBC  Monitor for s/sx of bleeding   Ruben Im, PharmD Clinical Pharmacist 05/10/2023 5:11 PM Please check AMION for all [facility] Pharmacy numbers

## 2023-05-10 NOTE — Progress Notes (Signed)
 PT Cancellation Note  Patient Details Name: Vicki [name] MRN: [id number] DOB: 09-Aug-1923   Cancelled Treatment:    Reason Eval/Treat Not Completed: Medical issues which prohibited therapy (Pt got up to [facility] with nursing and went into A-Fib. MD notified and called to room. Pt noting chest pain with HR in 130s-140s and SBP in 110-120s. Will hold PT evaluation until more medically appropriate.)  Cheri Guppy, PT, DPT Acute Rehabilitation Services Office: [phone] Secure Chat Preferred  Richardson Chiquito 05/10/2023, 9:28 AM

## 2023-05-10 NOTE — Progress Notes (Signed)
 Patient Name: Vicki [name] Date of Encounter: 05/10/2023 [facility] Cardiologist: None most recently seen by Dr. [place]  Patient Profile.    Vicki [name] is a 89 y.o. 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  In the past, and during this hospitalization as well, she has declined invasive evaluations and treatments.  No plans for cardiac catheterization.  Interval Summary  .    Early this morning she went into A-fib RVR blood pressures initially holding steady, but then went into the 90s after getting Sabharwal nitroglycerin for some chest discomfort.  On my evaluation was no longer noticing chest discomfort or dyspnea.  She did not notice her heart rate going fast either.  We actually discussed CODE STATUS and she indicated that she would not want to have chest compressions or breathing tube placed.  Discussed this with the primary team and I think they will change her to DNR/DNI.  Assessment & Plan .     Non-STEMI: Pt has declined Invasive procedures in the past, therefore no plans for invasive evaluation for further management With significant reduced EF and global hypokinesis on echo, suspicion this could be more related to Takotsubo/stress-induced cardiomyopathy and therefore not true non-STEMI/ACS.   However agree with 48 hours of IV heparin Still on aspirin and Plavix, however she did not tolerate this with significant bruising in the outpatient setting.  Will probably convert to aspirin monotherapy. Is on 30 mg Imdur which we will continue for now especially with her having some chest pain this morning Has been on carvedilol which have held after this morning's dose due to hypotension-will reassess in the morning. Continue low-dose ARB for now. Not on statin, will need to  address the reason for not being on statin in the outpatient setting. New onset A-fib RVR Initially blood pressures were stable, but she had received her morning antihypertensives +2 supplemented nitroglycerin and her blood pressures went into the 80s.  Was given 500 mL bolus and carvedilol held for a evening dose. Plan was to treat with IV amiodarone bolus and drip with attempted chemically cardiovert.   -Plan was to avoid need for DCCV.  Thankfully, shortly after the bolus and initiation of the 60 mg/h dosing infusion, she converted back to sinus rhythm and her pressures are stabilized. Will convert her amiodarone to 1 mg twice daily which would likely continue on discharge based on her low EF and MR on echo making her very unlikely to tolerate Echo report shows EF of 2025% with global akinesis and significant valvular disease-significant drop in EF is not really consistent with only a troponin of 3000.  I suspect that this could be Takotsubo cardiomyopathy. As her blood pressures recouped, will restart carvedilol likely tomorrow and continue low-dose ARB. Seems if anything a little bit dry if not euvolemic, will hold off on diuretic for now.  Code discussion with patient and family-made DNR by primary service.   We will continue to monitor.  Has been stopping heparin tomorrow.  After long talk with the family, she did not do well with aspirin and Plavix with significant bruising and nuisance bleeding.  Likely not a good candidate for Plavix and per daughter-in-law, does not seem to be a good candidate for DOAC.  As such we will just simply treat with aspirin  Vital Signs .    Vitals:   05/10/23 1200 05/10/23 1300 05/10/23 1400 05/10/23 1500  BP: (!) 101/57 (!) 97/56 (!) 94/47 (!) 103/51  Pulse: 72 72 69 67  Resp: 16 20 15  (!) 24  Temp:      TempSrc:      SpO2: 99% 100% 100% 100%  Weight:      Height:        Intake/Output Summary (Last 24 hours) at 05/10/2023 1532 Last data filed at  05/10/2023 1403 Gross per 24 hour  Intake 840 ml  Output 2350 ml  Net -1510 ml      05/10/2023    5:00 AM 05/09/2023    4:35 PM 05/09/2023    7:02 AM  Last 3 Weights  Weight (lbs) 145 lb 4.5 oz 151 lb 10.8 oz 145 lb 4.5 oz  Weight (kg) 65.9 kg 68.8 kg 65.9 kg      Telemetry/ECG    Initial evaluation showed A-fib with rates in the 140s to 150s- Personally Reviewed  2 hours later after IV amiodarone back in sinus rhythm in the 70s.   Echocardiogram results 05/10/2023: EF 20 to 25% global hypokinesis.  Moderate LV dilation.  Severe MR with moderate MAC.  Severe LA dilation.  Mildly elevated PAP with dilated IVC and RAP estimated 8 mmHg.  Physical Exam .   GEN: No acute distress.   Neck: No JVD Cardiac: On initial evaluation was an irregular irregular rhythm with rapid rate.  Very difficult to auscultate any murmurs or rubs or gallops. Respiratory:Marland Kitchen  Mildly diminished breath sounds but likely due to effort. GI: Soft, nontender, non-distended  MS: No edema   For questions or updates, please contact [facility] Please consult [email or web address] for contact info under        Signed, Bryan Lemma, MD

## 2023-05-10 NOTE — TOC Initial Note (Signed)
 Transition of Care [facility]) - Initial/Assessment Note    Patient Details  Name: Vicki [name] MRN: [id number] Date of Birth: 16-Apr-1923  Transition of Care [facility]) CM/SW Contact:    Leone Haven, RN Phone Number: 05/10/2023, 3:36 PM  Clinical Narrative:                 From home alone,  has PCP and insurance on file, states has aide from 6:30 to 9:30 Mon thru Sunday, she has walker and a bsc at home. States her son will transport them home at [employer] and he is support system, states [facility].  Pta self ambulatory with walker. Await pt eval.   Expected Discharge Plan: Home w Home Health Services Barriers to Discharge: Continued Medical Work up   Patient Goals and CMS Choice Patient states their goals for this hospitalization and ongoing recovery are:: return home   Choice offered to / list presented to : NA      Expected Discharge Plan and Services In-house Referral: NA Discharge Planning Services: CM Consult   Living arrangements for the past 2 months: Single Family Home                 DME Arranged: N/A DME Agency: NA         HH Agency: NA        Prior Living Arrangements/Services Living arrangements for the past 2 months: Single Family Home Lives with:: Self Patient language and need for interpreter reviewed:: Yes Do you feel safe going back to the place where you live?: Yes      Need for Family Participation in Patient Care: Yes (Comment) Care giver support system in place?: Yes (comment) Current home services: DME (walker, bsc) Criminal Activity/Legal Involvement Pertinent to Current Situation/Hospitalization: No - Comment as needed  Activities of Daily Living   ADL Screening (condition at time of admission) Independently performs ADLs?: No Does the patient have a NEW difficulty with bathing/dressing/toileting/self-feeding that is expected to last >3 days?: No Does the patient have a NEW difficulty with getting in/out of  bed, walking, or climbing stairs that is expected to last >3 days?: No Does the patient have a NEW difficulty with communication that is expected to last >3 days?: No Is the patient deaf or have difficulty hearing?: Yes Does the patient have difficulty seeing, even when wearing glasses/contacts?: No Does the patient have difficulty concentrating, remembering, or making decisions?: Yes  Permission Sought/Granted Permission sought to share information with : Case Manager Permission granted to share information with : Yes, Verbal Permission Granted              Emotional Assessment Appearance:: Appears stated age Attitude/Demeanor/Rapport: Engaged Affect (typically observed): Appropriate Orientation: : Oriented to Self, Oriented to Place, Oriented to  Time, Oriented to Situation Alcohol / Substance Use: Not Applicable Psych Involvement: No (comment)  Admission diagnosis:  CHF (congestive heart failure) (HCC) [I50.9] Acute on chronic congestive heart failure, unspecified heart failure type [facility]) [I50.9] Patient Active Problem List   Diagnosis Date Noted   Non-ST elevation (NSTEMI) myocardial infarction (HCC) 05/09/2023   CHF (congestive heart failure) (HCC) 05/08/2023   NSTEMI (non-ST elevated myocardial infarction) (HCC) 02/06/2023   Acute on chronic systolic CHF (congestive heart failure) (HCC) 02/06/2023   Essential hypertension 02/06/2023   Hypothyroidism 02/06/2023   PCP:  Deloris Ping, MD Pharmacy:   [id number] [place], [place] - [address] [place]  [place] [address] Phone: [phone] Fax: [phone]  [facility] - [place], [place] - [address] [place] [address] Phone: [phone] Fax: [phone]     Social Drivers of Health (SDOH) Social History: SDOH Screenings   Food Insecurity: No Food Insecurity (05/09/2023)  Housing: Low Risk  (05/09/2023)   Transportation Needs: No Transportation Needs (05/09/2023)  Utilities: Not At Risk (05/09/2023)  Financial Resource Strain: Low Risk  (04/26/2023)   Received from [facility]  Physical Activity: Unknown (09/06/2022)   Received from [facility]  Social Connections: Moderately Integrated (05/09/2023)  Stress: No Stress Concern Present (01/02/2023)   Received from [facility]  Tobacco Use: Low Risk  (05/08/2023)   SDOH Interventions:     Readmission Risk Interventions    05/10/2023    3:32 PM  Readmission Risk Prevention Plan  Transportation Screening Complete  PCP or Specialist Appt within 5-7 Days Complete  Home Care Screening Complete  Medication Review (RN CM) Complete

## 2023-05-10 NOTE — Progress Notes (Signed)
 Heart Failure Navigator Progress Note  Assessed for Heart & Vascular TOC clinic readiness.  Patient does not meet criteria due to per notes patient see's [facility]. .   Navigator will sign off at this time.   Rhae Hammock, BSN, Scientist, clinical (histocompatibility and immunogenetics) Only

## 2023-05-10 NOTE — Progress Notes (Signed)
 PROGRESS NOTE    Vicki [name]  UJW:[id number] DOB: [birth year] DOA: 05/08/2023 PCP: Deloris Ping, MD   Brief Narrative:  89 year old female with history of hypertension, hypothyroidism, GERD, hyperlipidemia, rheumatoid arthritis, CKD stage IIIb, chronic systolic heart failure, recent non-STEMI in 01/2023 during which patient declined cardiac catheterization and opted for medical management only presented with shortness of breath and chest discomfort.  On presentation, WBC was 13.2, high-sensitivity troponins were 66 and then subsequently 2968.  COVID/influenza/RSV PCR negative.  Chest x-ray for possible pulmonary edema along with atelectasis or pneumonia.  She was started on IV diuretics along with heparin drip.  Cardiology was consulted.  Assessment & Plan:   Non-STEMI Acute on chronic systolic heart failure Hypertension Hyperlipidemia -Presented with chest discomfort and shortness of breath and was found to have elevated troponins as above and chest x-ray as above. -Continue heparin drip.  Patient refused cardiac catheterization again.  Cardiology following -Antihypertensives and diuretics held this morning because of hypotension -Continue aspirin and Plavix -Crestor on hold  Paroxysmal A-fib with RVR -Heart rates going up to the 140s this morning with hypotension.  Patient is being started on IV amiodarone.  Hypothyroidism--continue levothyroxine  Leukocytosis -Mild.  Monitor  Hyponatremia -Improved  Acute metabolic acidosis -Resolved  Goals of care -Patient wished to be full code on presentation but after discussion with cardiology and myself this morning, patient has agreed for DNR -Palliative care following  DVT prophylaxis: Heparin drip Code Status: DNR Family Communication: None at bedside Disposition Plan: Status is: Inpatient Remains inpatient appropriate because: Of severity of illness    Consultants: Cardiology/palliative care  Procedures:  None  Antimicrobials: None   Subjective: Patient seen and examined at bedside.  Complains of intermittent chest pain and shortness of breath.  No fever, vomiting, abdominal pain reported  Objective: Vitals:   05/10/23 0903 05/10/23 0911 05/10/23 0924 05/10/23 0933  BP:  (!) 86/65 112/77 (!) 106/95  Pulse: (!) 153 (!) 55 (!) 127 62  Resp: 19 (!) 27 12 19   Temp:      TempSrc:      SpO2: 99% 98% 98% 99%  Weight:      Height:        Intake/Output Summary (Last 24 hours) at 05/10/2023 1002 Last data filed at 05/10/2023 0900 Gross per 24 hour  Intake 360 ml  Output 1850 ml  Net -1490 ml   Filed Weights   05/09/23 0702 05/09/23 1635 05/10/23 0500  Weight: 65.9 kg 68.8 kg 65.9 kg    Examination:  General exam: Appears calm and comfortable.  Elderly female lying in bed. Respiratory system: Bilateral decreased breath sounds at bases with scattered crackles and intermittent tachypnea Cardiovascular system: S1 & S2 heard, intermittently cardiac Gastrointestinal system: Abdomen is nondistended, soft and nontender. Normal bowel sounds heard. Extremities: No cyanosis, clubbing, edema  Central nervous system: Alert and oriented.  Slow to respond.  Poor historian.  No focal neurological deficits. Moving extremities Skin: No rashes, lesions or ulcers Psychiatry: Flat affect.  Not agitated.   Data Reviewed: I have personally reviewed following labs and imaging studies  CBC: Recent Labs  Lab 05/08/23 2006 05/09/23 0513 05/10/23 0253  WBC 13.2* 12.0* 11.5*  NEUTROABS  --   --  7.0  HGB 14.0 13.7 11.3*  HCT 42.8 41.1 33.6*  MCV 95.1 92.6 92.6  PLT 285 256 222   Basic Metabolic Panel: Recent Labs  Lab 05/08/23 2006 05/09/23 0513 05/10/23 0253  NA 132* 134* 136  K  4.1 3.6 3.5  CL 97* 104 105  CO2 20* 21* 23  GLUCOSE 254* 146* 118*  BUN 36* 32* 51*  CREATININE 1.09* 0.90 0.95  CALCIUM 9.7 9.1 8.5*  MG  --   --  2.2   GFR: Estimated Creatinine Clearance: 30.1 mL/min  (by C-G formula based on SCr of 0.95 mg/dL). Liver Function Tests: Recent Labs  Lab 05/09/23 0513  AST 56*  ALT 39  ALKPHOS 42  BILITOT 0.4  PROT 6.4*  ALBUMIN 4.0   No results for input(s): "LIPASE", "AMYLASE" in the last 168 hours. No results for input(s): "AMMONIA" in the last 168 hours. Coagulation Profile: No results for input(s): "INR", "PROTIME" in the last 168 hours. Cardiac Enzymes: No results for input(s): "CKTOTAL", "CKMB", "CKMBINDEX", "TROPONINI" in the last 168 hours. BNP (last 3 results) No results for input(s): "PROBNP" in the last 8760 hours. HbA1C: Recent Labs    05/09/23 0513  HGBA1C 5.4   CBG: Recent Labs  Lab 05/09/23 0746 05/09/23 1207 05/09/23 1648 05/09/23 2107 05/10/23 0553  GLUCAP 135* 108* 124* 117* 110*   Lipid Profile: No results for input(s): "CHOL", "HDL", "LDLCALC", "TRIG", "CHOLHDL", "LDLDIRECT" in the last 72 hours. Thyroid Function Tests: No results for input(s): "TSH", "T4TOTAL", "FREET4", "T3FREE", "THYROIDAB" in the last 72 hours. Anemia Panel: No results for input(s): "VITAMINB12", "FOLATE", "FERRITIN", "TIBC", "IRON", "RETICCTPCT" in the last 72 hours. Sepsis Labs: No results for input(s): "PROCALCITON", "LATICACIDVEN" in the last 168 hours.  Recent Results (from the past 240 hours)  Resp panel by RT-PCR (RSV, Flu A&B, Covid) Anterior Nasal Swab     Status: None   Collection Time: 05/08/23  8:10 PM   Specimen: Anterior Nasal Swab  Result Value Ref Range Status   SARS Coronavirus 2 by RT PCR NEGATIVE NEGATIVE Final   Influenza A by PCR NEGATIVE NEGATIVE Final   Influenza B by PCR NEGATIVE NEGATIVE Final    Comment: (NOTE) The Xpert Xpress SARS-CoV-2/FLU/RSV plus assay is intended as an aid in the diagnosis of influenza from Nasopharyngeal swab specimens and should not be used as a sole basis for treatment. Nasal washings and aspirates are unacceptable for Xpert Xpress SARS-CoV-2/FLU/RSV testing.  Fact Sheet for  Patients: BloggerCourse.com  Fact Sheet for Healthcare Providers: SeriousBroker.it  This test is not yet approved or cleared by the [place] FDA and has been authorized for detection and/or diagnosis of SARS-CoV-2 by FDA under an Emergency Use Authorization (EUA). This EUA will remain in effect (meaning this test can be used) for the duration of the COVID-19 declaration under Section 564(b)(1) of the Act, 21 U.S.C. section 360bbb-3(b)(1), unless the authorization is terminated or revoked.     Resp Syncytial Virus by PCR NEGATIVE NEGATIVE Final    Comment: (NOTE) Fact Sheet for Patients: BloggerCourse.com  Fact Sheet for Healthcare Providers: SeriousBroker.it  This test is not yet approved or cleared by the [place] FDA and has been authorized for detection and/or diagnosis of SARS-CoV-2 by FDA under an Emergency Use Authorization (EUA). This EUA will remain in effect (meaning this test can be used) for the duration of the COVID-19 declaration under Section 564(b)(1) of the Act, 21 U.S.C. section 360bbb-3(b)(1), unless the authorization is terminated or revoked.  Performed at [facility], [address] [place], [place] [address]          Radiology Studies: DG Chest Port 1 View Result Date: 05/08/2023 CLINICAL DATA:  Chest pain EXAM: PORTABLE CHEST 1 VIEW COMPARISON:  Chest  x-ray 02/06/2023 FINDINGS: The heart is enlarged. There are increased central interstitial markings bilaterally. There are patchy infrahilar opacities bilaterally. No pleural effusion or pneumothorax visualized. No acute fractures. IMPRESSION: 1. Cardiomegaly with increased central interstitial markings bilaterally, likely pulmonary edema. 2. Patchy infrahilar opacities bilaterally may represent atelectasis or pneumonia. Electronically Signed   By: Darliss Cheney M.D.   On: 05/08/2023 22:21         Scheduled Meds:  aspirin EC  81 mg Oral Daily   clopidogrel  75 mg Oral Daily   isosorbide mononitrate  30 mg Oral Daily   levothyroxine  50 mcg Oral Daily   losartan  12.5 mg Oral Daily   Continuous Infusions:  amiodarone 60 mg/hr (05/10/23 0945)   Followed by   amiodarone     heparin 1,050 Units/hr (05/10/23 0750)          Glade Lloyd, MD [facility] 05/10/2023, 10:02 AM

## 2023-05-10 NOTE — Progress Notes (Addendum)
   Brief Cardiology Note  Called b/c pt now in Afib RVR, noting chest pain - rates in 130s-140s, SBP in 110-120s.  Was on Xarelto PTA - but on IV Heparin for NSTEMI Med Rx =>  will treat with IV Amiodarone Bolus & gtt in an attempt to chemically cardiovert. = not likely to tolerate Afib RVR in setting of MI.  Pt has declined Invasive procedures in the past - will try to avoid need for DCCV.   Will hold Carvedilol for now to avoid Hypotension.   Continue with IV Heparin for now.    Full note to follow.   => Cardioverted on IV Amiodarone - will d/c IV & start PO.     Bryan Lemma, MD

## 2023-05-10 NOTE — Progress Notes (Signed)
 PHARMACY - ANTICOAGULATION CONSULT NOTE  Pharmacy Consult for IV heparin Indication: chest pain/ACS  Allergies  Allergen Reactions   Codeine Nausea Only    Makes her crazy Hallucinations    Latex Itching   Penicillins Rash    YEAST INFECTION  YEAST INFECTION   Sulfa Antibiotics Anaphylaxis and Other (See Comments)    Heart stops   Thiopental Anaphylaxis and Rash    Other reaction(s): Unknown  CARDIAC ARREST  CARDIAC ARREST   Statins Itching    Itchy scalp with hair loss, confusion    Patient Measurements: Height: 5\' 2"  (157.5 cm) Weight: 65.9 kg (145 lb 4.5 oz) IBW/kg (Calculated) : 50.1 Heparin Dosing Weight: 63.4 kg  Vital Signs: Temp: 98.2 F (36.8 C) (03/10 0513) Temp Source: Oral (03/10 0513) BP: 132/71 (03/10 0513) Pulse Rate: 81 (03/10 0513)  Labs: Recent Labs    05/08/23 2006 05/08/23 2224 05/09/23 0045 05/09/23 0513 05/09/23 1208 05/09/23 2123 05/10/23 0253  HGB 14.0  --   --  13.7  --   --  11.3*  HCT 42.8  --   --  41.1  --   --  33.6*  PLT 285  --   --  256  --   --  222  APTT  --   --   --   --  39*  --   --   HEPARINUNFRC  --   --    < >  --  0.12* 0.21* 0.31  CREATININE 1.09*  --   --  0.90  --   --  0.95  TROPONINIHS 66* 2,968*  --   --   --   --   --    < > = values in this interval not displayed.    Estimated Creatinine Clearance: 30.1 mL/min (by C-G formula based on SCr of 0.95 mg/dL).   Medical History: Past Medical History:  Diagnosis Date   Hypertension    Hypothyroidism     Assessment: Vicki [name] is a 89 y.o. year old female admitted on 05/08/2023 with concern for ACS. Troponin 66 > 2968. Previously on xarelto and patient is unsure of some of her medications. Pharmacy consulted to dose heparin.  Does not appear patient taking Xarelto PTA, last filled in September for 14 day supply.  Heparin level barely therapeutic at 0.31 on 1000 units/hr. Hgb 13.7 > 11.3, plt wnl. No signs of bleeding or issues with infusion per RN.     Goal of Therapy:  Heparin level 0.3-0.7 units/mL Monitor platelets by anticoagulation protocol: Yes   Plan:  Increase heparin slightly to 1050 units/hr 8 hour confirmatory heparin level  Daily heparin level and CBC  Monitor for s/sx of bleeding  Thank you for involving pharmacy in the patient's care.   Theotis Burrow, PharmD PGY1 Acute Care Pharmacy Resident  05/10/2023 6:37 AM

## 2023-05-10 NOTE — Progress Notes (Signed)
 Daily Progress Note   Patient Name: Vicki [name]       Date: 05/10/2023 DOB: 24-Jan-1924  Age: 89 y.o. MRN#: [id number] Attending Physician: Glade Lloyd, MD Primary Care Physician: Deloris Ping, MD Admit Date: 05/08/2023  Reason for Consultation/Follow-up: Establishing goals of care  Subjective: Medical records reviewed including progress notes, labs, and imaging. Patient assessed at the bedside. She appears comfortable, no acute distress. Discussed with RN. Noted that she is now DNR/DNI after discussion with primary attending.  Patient is adamant that she does not want to speak with PMT and states that her DIL does not either.  Provided with PMT contact information should patient and/or family request re-engagement for GOC discussions.   Length of Stay: 2   Physical Exam Vitals and nursing note reviewed.  Constitutional:      General: She is not in acute distress. Cardiovascular:     Rate and Rhythm: Rhythm irregular.  Pulmonary:     Effort: Pulmonary effort is normal. No respiratory distress.  Neurological:     Mental Status: She is alert and oriented to person, place, and time.  Psychiatric:        Speech: Speech normal.            Vital Signs: BP 112/77   Pulse (!) 127   Temp 98.2 F (36.8 C) (Oral)   Resp 12   Ht 5\' 2"  (1.575 m)   Wt 65.9 kg   SpO2 98%   BMI 26.57 kg/m  SpO2: SpO2: 98 % O2 Device: O2 Device: Nasal Cannula O2 Flow Rate: O2 Flow Rate (L/min): 2 L/min      Palliative Assessment/Data: 50-60%   Palliative Care Assessment & Plan   Patient Profile: 89 y.o. female  with past medical history of HTN, Hypothyroidism, GERD, HLD, rheumatoid arthritis, CKD 3b, HFrEF, recent NSTEMI in 01/2023 admitted on 05/08/2023 with shortness of breath and chest discomfort.    Patient is admitted for another  NSTEMI and acute on chronic CHF. PMT has been consulted to assist with goals of care conversation.  Assessment: Goals of care conversation  NSTEMI Afib with RVR Acute on chronic combined systolic and diastolic heart failure  Recommendations/Plan: Continue DNR/DNI Continue gentle medical interventions, patient does not want any invasive procedures Patient does not want any further PMT involvement in her care. Encouraged her to reach out if she changes her mind Please re-consult PMT as appropriate if desired by patient/family   Prognosis:  Unable to determine  Discharge Planning: To Be Determined  Care plan was discussed with patient, RN   Total time: I spent 35 minutes  in the care of the patient today in the above activities and documenting the encounter.          Jaylia Pettus Jeni Salles, PA-C  Palliative Medicine Team Team phone # [phone]  Thank you for allowing the Palliative Medicine Team to assist in the care of this patient. Please utilize secure chat with additional questions, if there is no response within 30 minutes please call the above phone number.  Palliative Medicine Team providers are available by phone from 7am to 7pm daily and can be reached through the team cell phone.  Should this patient require assistance outside of these hours, please call the patient's attending physician.

## 2023-05-10 NOTE — Progress Notes (Signed)
  Echocardiogram 2D Echocardiogram has been performed.  Ocie Doyne RDCS 05/10/2023, 10:56 AM

## 2023-05-11 DIAGNOSIS — I214 Non-ST elevation (NSTEMI) myocardial infarction: Secondary | ICD-10-CM | POA: Diagnosis not present

## 2023-05-11 DIAGNOSIS — I5181 Takotsubo syndrome: Secondary | ICD-10-CM | POA: Diagnosis not present

## 2023-05-11 DIAGNOSIS — I4891 Unspecified atrial fibrillation: Secondary | ICD-10-CM | POA: Diagnosis not present

## 2023-05-11 DIAGNOSIS — I509 Heart failure, unspecified: Secondary | ICD-10-CM | POA: Diagnosis not present

## 2023-05-11 LAB — BASIC METABOLIC PANEL
Anion gap: 13 (ref 5–15)
BUN: 35 mg/dL — ABNORMAL HIGH (ref 8–23)
CO2: 19 mmol/L — ABNORMAL LOW (ref 22–32)
Calcium: 8.7 mg/dL — ABNORMAL LOW (ref 8.9–10.3)
Chloride: 105 mmol/L (ref 98–111)
Creatinine, Ser: 0.96 mg/dL (ref 0.44–1.00)
GFR, Estimated: 54 mL/min — ABNORMAL LOW (ref >60)
Glucose, Bld: 104 mg/dL — ABNORMAL HIGH (ref 70–99)
Potassium: 3.4 mmol/L — ABNORMAL LOW (ref 3.5–5.1)
Sodium: 137 mmol/L (ref 135–145)

## 2023-05-11 LAB — GLUCOSE, CAPILLARY
Glucose-Capillary: 101 mg/dL — ABNORMAL HIGH (ref 70–99)
Glucose-Capillary: 112 mg/dL — ABNORMAL HIGH (ref 70–99)
Glucose-Capillary: 96 mg/dL (ref 70–99)
Glucose-Capillary: 133 mg/dL — ABNORMAL HIGH (ref 70–99)

## 2023-05-11 LAB — CBC
HCT: 31.7 % — ABNORMAL LOW (ref 36.0–46.0)
Hemoglobin: 10.4 g/dL — ABNORMAL LOW (ref 12.0–15.0)
MCH: 30.8 pg (ref 26.0–34.0)
MCHC: 32.8 g/dL (ref 30.0–36.0)
MCV: 93.8 fL (ref 80.0–100.0)
Platelets: 212 10*3/uL (ref 150–400)
RBC: 3.38 MIL/uL — ABNORMAL LOW (ref 3.87–5.11)
RDW: 14.8 % (ref 11.5–15.5)
WBC: 8.3 10*3/uL (ref 4.0–10.5)
nRBC: 0 % (ref 0.0–0.2)

## 2023-05-11 LAB — HEPARIN LEVEL (UNFRACTIONATED): Heparin Unfractionated: 0.38 [IU]/mL (ref 0.30–0.70)

## 2023-05-11 LAB — MAGNESIUM: Magnesium: 2.2 mg/dL (ref 1.7–2.4)

## 2023-05-11 MED ORDER — POTASSIUM CHLORIDE CRYS ER 20 MEQ PO TBCR
40.0000 meq | EXTENDED_RELEASE_TABLET | Freq: Once | ORAL | Status: AC
  Administered 2023-05-11: 40 meq via ORAL
  Filled 2023-05-11: qty 2

## 2023-05-11 NOTE — Progress Notes (Addendum)
 Patient Name: Vicki [name] Date of Encounter: 05/11/2023 [facility] Cardiologist: None   Interval Summary  .    Feels well this morning, no complaints. Wants to go home.   Sitting up in bed.  Feels well.  No further chest pain or shortness of breath.  No rapid heart rates.  Vital Signs .    Vitals:   05/11/23 0600 05/11/23 0716 05/11/23 0800 05/11/23 1100  BP: (!) 118/54 111/66  106/70  Pulse: 66 69  73  Resp: 13 20  18   Temp:  97.9 F (36.6 C) 97.9 F (36.6 C) 97.7 F (36.5 C)  TempSrc:  Oral  Oral  SpO2: 100% 99%  99%  Weight:      Height:        Intake/Output Summary (Last 24 hours) at 05/11/2023 1150 Last data filed at 05/11/2023 0400 Gross per 24 hour  Intake 763.39 ml  Output 1150 ml  Net -386.61 ml      05/11/2023    5:00 AM 05/10/2023    5:00 AM 05/09/2023    4:35 PM  Last 3 Weights  Weight (lbs) 138 lb 12.8 oz 145 lb 4.5 oz 151 lb 10.8 oz  Weight (kg) 62.959 kg 65.9 kg 68.8 kg      Telemetry/ECG    Sinus Rhythm - Personally Reviewed  Physical Exam .    GEN: No acute distress.   Neck: No JVD Cardiac: RRR, no murmurs, rubs, or gallops.  Respiratory: Clear to auscultation bilaterally. GI: Soft, nontender, non-distended  MS: trace bilateral LE edema  Assessment & Plan .     89 y.o. 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 -- Echocardiogram showed LVEF of 20 to 25% with global hypokinesis, raising concern for possible Takotsubo cardiomyopathy -- GDMT: Continue carvedilol 3.125 mg twice daily, losartan 12.5 mg daily, Imdur 30 mg daily -- Of note she has not been able to  tolerate aspirin and Plavix secondary to significant bruising and bleeding in the past per discussion via [name] with patient's family.  Recommendations are to continue with 81 mg aspirin daily at discharge   New onset atrial fibrillation with RVR -- Developed episode yesterday afternoon, treated with IV amiodarone with conversion to sinus rhythm.  -- Continue amiodarone 200 mg twice daily x7 days, then could consider titrating down to 200 mg daily.  Would continue as an outpatient given the likelihood of recurrent atrial fibrillation  HFrEF -- Echocardiogram showed LVEF of 20 to 25%, global hypokinesis, moderately reduced RV, mildly elevated PA pressure, severely dilated left atria, mild aortic valve stenosis -- She does not appear volume overloaded on exam -- GDMT: As above continue carvedilol 3.125 mg twice daily, losartan 12.5 mg daily. No need for diuretic at this time.  With advanced age would avoid SGLT2 -- Seems relatively euvolemic, but may not be a bad idea to have PRN Lasix on discharge-maybe 20 mg as needed for weight gain more than 3 pounds. Would probably reconsider checking 2D echocardiogram in 2-3 months.  Hypokalemia -- K+ 3.4, supplement   For questions or updates, please contact [facility] Please consult [email or web address] for contact info under  Signed, Laverda Page, NP    ATTENDING ATTESTATION  I have seen, examined and evaluated the patient this PM on rounds along with Laverda Page, NP-C.  After reviewing all the available data and chart, we discussed the patients laboratory, study & physical findings as well as symptoms in detail.  I agree with her findings, examination as well as impression recommendations as per our discussion.    Attending adjustments noted in italics.   Overall doing pretty well.  No longer in A-fib.  Converted to p.o. amiodarone as noted above.  Short 200 mg twice daily load then going to daily dosing.  Would continue  for at least a month to ensure no recurrent A-fib, I would probably then maybe reduce to 100 mg daily.  Her primary cardiologist Dr. Chales Abrahams for [facility].  Would need to make sure that she has follow-up with with Dr. Marcille Blanco office.  Dispo depends upon her conditioning and evaluation with by PT OT-she lives alone and will need to have a stable environment to return home. Although her EF is down, she seems euvolemic and not having any active CHF symptoms.  NYHA class II    Marykay Lex, MD, MS Bryan Lemma, M.D., M.S. Interventional Cardiologist  [facility]  Pager # [phone] Phone # [phone] [address] [place], [place] [address]

## 2023-05-11 NOTE — Progress Notes (Signed)
 PHARMACY - ANTICOAGULATION CONSULT NOTE  Pharmacy Consult for IV heparin Indication: chest pain/ACS  Allergies  Allergen Reactions   Codeine Nausea Only    Makes her crazy Hallucinations    Latex Itching   Penicillins Rash    YEAST INFECTION  YEAST INFECTION   Sulfa Antibiotics Anaphylaxis and Other (See Comments)    Heart stops   Thiopental Anaphylaxis and Rash    Other reaction(s): Unknown  CARDIAC ARREST  CARDIAC ARREST   Statins Itching    Itchy scalp with hair loss, confusion    Patient Measurements: Height: 5\' 2"  (157.5 cm) Weight: 63 kg (138 lb 12.8 oz) IBW/kg (Calculated) : 50.1 Heparin Dosing Weight: 63.4 kg  Vital Signs: Temp: 97.5 F (36.4 C) (03/11 0454) Temp Source: Oral (03/11 0454) BP: 118/54 (03/11 0600) Pulse Rate: 66 (03/11 0600)  Labs: Recent Labs    05/08/23 2006 05/08/23 2224 05/09/23 0045 05/09/23 0513 05/09/23 1208 05/09/23 2123 05/10/23 0253 05/10/23 1541 05/11/23 0259  HGB 14.0  --   --  13.7  --   --  11.3*  --  10.4*  HCT 42.8  --   --  41.1  --   --  33.6*  --  31.7*  PLT 285  --   --  256  --   --  222  --  212  APTT  --   --   --   --  39*  --   --   --   --   HEPARINUNFRC  --   --    < >  --  0.12*   < > 0.31 0.34 0.38  CREATININE 1.09*  --   --  0.90  --   --  0.95  --  0.96  TROPONINIHS 66* 2,968*  --   --   --   --   --   --   --    < > = values in this interval not displayed.    Estimated Creatinine Clearance: 29.2 mL/min (by C-G formula based on SCr of 0.96 mg/dL).   Medical History: Past Medical History:  Diagnosis Date   Hypertension    Hypothyroidism     Assessment: Vicki [name] is a 89 y.o. year old female admitted on 05/08/2023 with concern for ACS. Troponin 66 > 2968. Previously on xarelto and patient is unsure of some of her medications. Pharmacy consulted to dose heparin.  Does not appear patient taking Xarelto PTA, last filled in September for 14 day supply.   Heparin level therapeutic at 0.38 on  1100 units/hr. Hgb 10.4, plt wnl. No signs of bleeding or issues with infusion noted.    Goal of Therapy:  Heparin level 0.3-0.7 units/mL Monitor platelets by anticoagulation protocol: Yes   Plan:  Continue heparin at 1100 units/hr Daily heparin level and CBC  Monitor for s/sx of bleeding F/u long-term anticoagulation plans    Thank you for involving pharmacy in the patient's care.   Theotis Burrow, PharmD PGY1 Acute Care Pharmacy Resident  05/11/2023 7:34 AM

## 2023-05-11 NOTE — Progress Notes (Signed)
 PROGRESS NOTE    Vicki [name]  ZOX:[id number] DOB: Jun 01, 1923 DOA: 05/08/2023 PCP: Deloris Ping, MD   Brief Narrative:  89 year old female with history of hypertension, hypothyroidism, GERD, hyperlipidemia, rheumatoid arthritis, CKD stage IIIb, chronic systolic heart failure, recent non-STEMI in 01/2023 during which patient declined cardiac catheterization and opted for medical management only presented with shortness of breath and chest discomfort.  On presentation, WBC was 13.2, high-sensitivity troponins were 66 and then subsequently 2968.  COVID/influenza/RSV PCR negative.  Chest x-ray for possible pulmonary edema along with atelectasis or pneumonia.  She was started on IV diuretics along with heparin drip.  Cardiology was consulted.  Assessment & Plan:   Non-STEMI Acute on chronic systolic heart failure Hypertension Hyperlipidemia -Presented with chest discomfort and shortness of breath and was found to have elevated troponins as above and chest x-ray as above. -2D echo showed EF of 20 to 25%.  Strict input and output.  Lower weights.  Fluid restriction. -Continue heparin drip.  Patient refused cardiac catheterization again.  Cardiology following -Continue aspirin, statin, Plavix, Coreg and Imdur along with losartan.  Paroxysmal A-fib with RVR -Heart rates going up to the 140s on 05/10/2023 morning with hypotension.  Patient was started on IV amiodarone by cardiology and subsequently patient converted to sinus rhythm.  Amiodarone has been switched to oral amiodarone by cardiology.  Currently rate controlled.  Hypothyroidism--continue levothyroxine  Leukocytosis -Resolved  Hyponatremia -Improved  Acute metabolic acidosis -Mild.  Encourage oral intake.  Monitor  Hypokalemia -Replace.  Repeat a.m. labs  Goals of care -Patient wished to be full code on presentation but after discussion with cardiology and myself on 05/10/2023, patient was made a DNR. -Patient did not  want to engage with palliative care.  Physical deconditioning -PT eval  DVT prophylaxis: Heparin drip Code Status: DNR Family Communication: None at bedside Disposition Plan: Status is: Inpatient Remains inpatient appropriate because: Of severity of illness    Consultants: Cardiology/palliative care  Procedures: 2D echo  Antimicrobials: None   Subjective: Patient seen and examined at bedside.  Denies any current chest pain.  Hard of hearing.  Poor historian.  Still short of breath with exertion.  No agitation, fever, vomiting reported. Objective: Vitals:   05/11/23 0100 05/11/23 0454 05/11/23 0500 05/11/23 0600  BP: (!) 105/55 102/71  (!) 118/54  Pulse: 74 63  66  Resp: 16 19  13   Temp: 98.8 F (37.1 C) (!) 97.5 F (36.4 C)    TempSrc: Oral Oral    SpO2: 100% 100%  100%  Weight:   63 kg   Height:        Intake/Output Summary (Last 24 hours) at 05/11/2023 0724 Last data filed at 05/11/2023 0400 Gross per 24 hour  Intake 1243.39 ml  Output 1750 ml  Net -506.61 ml   Filed Weights   05/09/23 1635 05/10/23 0500 05/11/23 0500  Weight: 68.8 kg 65.9 kg 63 kg    Examination:  General: On room air.  No distress.  Looks chronically ill and deconditioned.  Elderly female sitting on chair.  Hard of hearing. ENT/neck: No thyromegaly.  JVD is not elevated  respiratory: Decreased breath sounds at bases bilaterally with some crackles; no wheezing  CVS: S1-S2 heard, rate controlled currently Abdominal: Soft, nontender, slightly distended; no organomegaly, normal bowel sounds are heard Extremities: Trace lower extremity edema; no cyanosis  CNS: Awake, remains slow to respond and a poor historian.  No focal neurologic deficit.  Able to move extremities Lymph: No obvious  lymphadenopathy Skin: No obvious ecchymosis/lesions  psych: Mostly flat affect.  Not agitated currently.  Musculoskeletal: No obvious joint swelling/deformity    Data Reviewed: I have personally reviewed  following labs and imaging studies  CBC: Recent Labs  Lab 05/08/23 2006 05/09/23 0513 05/10/23 0253 05/11/23 0259  WBC 13.2* 12.0* 11.5* 8.3  NEUTROABS  --   --  7.0  --   HGB 14.0 13.7 11.3* 10.4*  HCT 42.8 41.1 33.6* 31.7*  MCV 95.1 92.6 92.6 93.8  PLT 285 256 222 212   Basic Metabolic Panel: Recent Labs  Lab 05/08/23 2006 05/09/23 0513 05/10/23 0253 05/11/23 0259  NA 132* 134* 136 137  K 4.1 3.6 3.5 3.4*  CL 97* 104 105 105  CO2 20* 21* 23 19*  GLUCOSE 254* 146* 118* 104*  BUN 36* 32* 51* 35*  CREATININE 1.09* 0.90 0.95 0.96  CALCIUM 9.7 9.1 8.5* 8.7*  MG  --   --  2.2 2.2   GFR: Estimated Creatinine Clearance: 29.2 mL/min (by C-G formula based on SCr of 0.96 mg/dL). Liver Function Tests: Recent Labs  Lab 05/09/23 0513  AST 56*  ALT 39  ALKPHOS 42  BILITOT 0.4  PROT 6.4*  ALBUMIN 4.0   No results for input(s): "LIPASE", "AMYLASE" in the last 168 hours. No results for input(s): "AMMONIA" in the last 168 hours. Coagulation Profile: No results for input(s): "INR", "PROTIME" in the last 168 hours. Cardiac Enzymes: No results for input(s): "CKTOTAL", "CKMB", "CKMBINDEX", "TROPONINI" in the last 168 hours. BNP (last 3 results) No results for input(s): "PROBNP" in the last 8760 hours. HbA1C: Recent Labs    05/09/23 0513  HGBA1C 5.4   CBG: Recent Labs  Lab 05/10/23 0553 05/10/23 1058 05/10/23 1607 05/10/23 2113 05/11/23 0628  GLUCAP 110* 99 131* 108* 101*   Lipid Profile: No results for input(s): "CHOL", "HDL", "LDLCALC", "TRIG", "CHOLHDL", "LDLDIRECT" in the last 72 hours. Thyroid Function Tests: No results for input(s): "TSH", "T4TOTAL", "FREET4", "T3FREE", "THYROIDAB" in the last 72 hours. Anemia Panel: No results for input(s): "VITAMINB12", "FOLATE", "FERRITIN", "TIBC", "IRON", "RETICCTPCT" in the last 72 hours. Sepsis Labs: No results for input(s): "PROCALCITON", "LATICACIDVEN" in the last 168 hours.  Recent Results (from the past 240  hours)  Resp panel by RT-PCR (RSV, Flu A&B, Covid) Anterior Nasal Swab     Status: None   Collection Time: 05/08/23  8:10 PM   Specimen: Anterior Nasal Swab  Result Value Ref Range Status   SARS Coronavirus 2 by RT PCR NEGATIVE NEGATIVE Final   Influenza A by PCR NEGATIVE NEGATIVE Final   Influenza B by PCR NEGATIVE NEGATIVE Final    Comment: (NOTE) The Xpert Xpress SARS-CoV-2/FLU/RSV plus assay is intended as an aid in the diagnosis of influenza from Nasopharyngeal swab specimens and should not be used as a sole basis for treatment. Nasal washings and aspirates are unacceptable for Xpert Xpress SARS-CoV-2/FLU/RSV testing.  Fact Sheet for Patients: BloggerCourse.com  Fact Sheet for Healthcare Providers: SeriousBroker.it  This test is not yet approved or cleared by the [place] FDA and has been authorized for detection and/or diagnosis of SARS-CoV-2 by FDA under an Emergency Use Authorization (EUA). This EUA will remain in effect (meaning this test can be used) for the duration of the COVID-19 declaration under Section 564(b)(1) of the Act, 21 U.S.C. section 360bbb-3(b)(1), unless the authorization is terminated or revoked.     Resp Syncytial Virus by PCR NEGATIVE NEGATIVE Final    Comment: (NOTE) Fact Sheet for  Patients: BloggerCourse.com  Fact Sheet for Healthcare Providers: SeriousBroker.it  This test is not yet approved or cleared by the [place] FDA and has been authorized for detection and/or diagnosis of SARS-CoV-2 by FDA under an Emergency Use Authorization (EUA). This EUA will remain in effect (meaning this test can be used) for the duration of the COVID-19 declaration under Section 564(b)(1) of the Act, 21 U.S.C. section 360bbb-3(b)(1), unless the authorization is terminated or revoked.  Performed at [facility], [address] [place],  [place] [address]          Radiology Studies: ECHOCARDIOGRAM COMPLETE Result Date: 05/10/2023    ECHOCARDIOGRAM REPORT   Patient Name:   Vicki [name] Date of Exam: 05/10/2023 Medical Rec #:  [id number]       Height:       62.0 in Accession #:    [id number]      Weight:       145.3 lb Date of Birth:  Nov 23, 1923      BSA:          1.669 m Patient Age:    89 years        BP:           131/66 mmHg Patient Gender: F               HR:           73 bpm. Exam Location:  Inpatient Procedure: 2D Echo, Color Doppler and Cardiac Doppler (Both Spectral and Color            Flow Doppler were utilized during procedure). Indications:    NSTEMI  History:        Patient has prior history of Echocardiogram examinations, most                 recent 02/08/2023. CHF; Risk Factors:Hypertension.  Sonographer:    Vern Claude Referring Phys: [id number] SARA-MAIZ A THOMAS IMPRESSIONS  1. Left ventricular ejection fraction, by estimation, is 20 to 25%. The left ventricle has severely decreased function. The left ventricle demonstrates global hypokinesis. The left ventricular internal cavity size was moderately dilated. Left ventricular diastolic parameters are indeterminate.  2. Right ventricular systolic function is moderately reduced. The right ventricular size is normal. There is mildly elevated pulmonary artery systolic pressure. The estimated right ventricular systolic pressure is 41.5 mmHg.  3. Left atrial size was severely dilated.  4. The mitral valve is degenerative. Severe mitral valve regurgitation. No evidence of mitral stenosis. Moderate mitral annular calcification.  5. The aortic valve is tricuspid. There is moderate calcification of the aortic valve. Aortic valve regurgitation is not visualized. Mild aortic valve stenosis. Aortic valve area, by VTI measures 2.82 cm. Aortic valve mean gradient measures 5.0 mmHg. Aortic valve Vmax measures 1.50 m/s.  6. The inferior vena cava is dilated in size with >50% respiratory  variability, suggesting right atrial pressure of 8 mmHg. FINDINGS  Left Ventricle: Left ventricular ejection fraction, by estimation, is 20 to 25%. The left ventricle has severely decreased function. The left ventricle demonstrates global hypokinesis. The left ventricular internal cavity size was moderately dilated. There is no left ventricular hypertrophy. Left ventricular diastolic parameters are indeterminate. Right Ventricle: The right ventricular size is normal. No increase in right ventricular wall thickness. Right ventricular systolic function is moderately reduced. There is mildly elevated pulmonary artery systolic pressure. The tricuspid regurgitant velocity is 3.02 m/s, and with an assumed right atrial pressure of 5 mmHg, the estimated  right ventricular systolic pressure is 41.5 mmHg. Left Atrium: Left atrial size was severely dilated. Right Atrium: Right atrial size was normal in size. Pericardium: There is no evidence of pericardial effusion. Mitral Valve: The mitral valve is degenerative in appearance. There is moderate thickening of the mitral valve leaflet(s). Moderate mitral annular calcification. Severe mitral valve regurgitation, with centrally-directed jet. No evidence of mitral valve stenosis. MV peak gradient, 6.0 mmHg. The mean mitral valve gradient is 2.0 mmHg. Tricuspid Valve: The tricuspid valve is normal in structure. Tricuspid valve regurgitation is mild . No evidence of tricuspid stenosis. Aortic Valve: The aortic valve is tricuspid. There is moderate calcification of the aortic valve. Aortic valve regurgitation is not visualized. Mild aortic stenosis is present. Aortic valve mean gradient measures 5.0 mmHg. Aortic valve peak gradient measures 9.0 mmHg. Aortic valve area, by VTI measures 2.82 cm. Pulmonic Valve: The pulmonic valve was normal in structure. Pulmonic valve regurgitation is not visualized. No evidence of pulmonic stenosis. Aorta: The aortic root is normal in size and  structure. Venous: The inferior vena cava is dilated in size with greater than 50% respiratory variability, suggesting right atrial pressure of 8 mmHg. IAS/Shunts: No atrial level shunt detected by color flow Doppler.  LEFT VENTRICLE PLAX 2D LVIDd:         5.30 cm      Diastology LVIDs:         4.00 cm      LV e' medial:    4.68 cm/s LV PW:         0.90 cm      LV E/e' medial:  24.1 LV IVS:        0.70 cm      LV e' lateral:   10.30 cm/s LVOT diam:     2.00 cm      LV E/e' lateral: 11.0 LV SV:         75 LV SV Index:   45 LVOT Area:     3.14 cm  LV Volumes (MOD) LV vol d, MOD A2C: 162.0 ml LV vol d, MOD A4C: 183.0 ml LV vol s, MOD A2C: 89.4 ml LV vol s, MOD A4C: 94.3 ml LV SV MOD A2C:     72.6 ml LV SV MOD A4C:     183.0 ml LV SV MOD BP:      81.3 ml RIGHT VENTRICLE             IVC RV Basal diam:  2.40 cm     IVC diam: 1.90 cm RV Mid diam:    2.00 cm RV S prime:     10.10 cm/s TAPSE (M-mode): 1.7 cm LEFT ATRIUM             Index        RIGHT ATRIUM           Index LA diam:        4.80 cm 2.88 cm/m   RA Area:     13.80 cm LA Vol (A2C):   65.2 ml 39.07 ml/m  RA Volume:   29.90 ml  17.92 ml/m LA Vol (A4C):   79.3 ml 47.52 ml/m LA Biplane Vol: 76.2 ml 45.66 ml/m  AORTIC VALVE                     PULMONIC VALVE AV Area (Vmax):    2.22 cm      PV Vmax:       1.02 m/s AV Area (  Vmean):   1.94 cm      PV Peak grad:  4.2 mmHg AV Area (VTI):     2.82 cm AV Vmax:           150.25 cm/s AV Vmean:          106.700 cm/s AV VTI:            0.265 m AV Peak Grad:      9.0 mmHg AV Mean Grad:      5.0 mmHg LVOT Vmax:         106.00 cm/s LVOT Vmean:        65.800 cm/s LVOT VTI:          0.238 m LVOT/AV VTI ratio: 0.90  AORTA Ao Root diam: 2.90 cm Ao Asc diam:  2.60 cm MITRAL VALVE                 TRICUSPID VALVE MV Area (PHT): 3.81 cm      TR Peak grad:   36.5 mmHg MV Area VTI:   2.95 cm      TR Vmax:        302.00 cm/s MV Peak grad:  6.0 mmHg MV Mean grad:  2.0 mmHg      SHUNTS MV Vmax:       1.22 m/s      Systemic VTI:   0.24 m MV Vmean:      65.4 cm/s     Systemic Diam: 2.00 cm MV Decel Time: 199 msec MR Peak grad:   78.9 mmHg MR Mean grad:   51.3 mmHg MR Vmax:        444.00 cm/s MR Vmean:       341.0 cm/s MR PISA:        0.57 cm MR PISA Radius: 0.30 cm MV E velocity: 113.00 cm/s MV A velocity: 87.80 cm/s MV E/A ratio:  1.29 Arvilla Meres MD Electronically signed by Arvilla Meres MD Signature Date/Time: 05/10/2023/11:08:06 AM    Final         Scheduled Meds:  amiodarone  200 mg Oral BID   aspirin EC  81 mg Oral Daily   carvedilol  3.125 mg Oral BID WC   clopidogrel  75 mg Oral Daily   isosorbide mononitrate  30 mg Oral Daily   levothyroxine  50 mcg Oral Daily   losartan  12.5 mg Oral Daily   rosuvastatin  10 mg Oral Daily   Continuous Infusions:  heparin 1,100 Units/hr (05/11/23 0446)          Glade Lloyd, MD [facility] 05/11/2023, 7:24 AM

## 2023-05-11 NOTE — Plan of Care (Signed)
  Problem: Education: Goal: Knowledge of General Education information will improve Description: Including pain rating scale, medication(s)/side effects and non-pharmacologic comfort measures Outcome: Progressing   Problem: Health Behavior/Discharge Planning: Goal: Ability to manage health-related needs will improve Outcome: Progressing   Problem: Clinical Measurements: Goal: Cardiovascular complication will be avoided Outcome: Progressing   Problem: Activity: Goal: Risk for activity intolerance will decrease Outcome: Progressing   Problem: Coping: Goal: Level of anxiety will decrease Outcome: Progressing   

## 2023-05-11 NOTE — Evaluation (Signed)
 Physical Therapy Evaluation Patient Details Name: Vicki [name] MRN: [id number] DOB: 05-Oct-1923 Today's Date: 05/11/2023  History of Present Illness  Vicki [name] is a 89 y.o. F admitted 05/08/23 with NSTEMI and acute on chronic CHF. Pt presented with SOB, chest pain, and elevated troponin and BNP. CXR with mild pulmonary edema. PMH significant for HTN, Hypothyroidism, GERD, HLD, rheumatoid arthritis, CKD 3b, HFrEF, recent NSTEMI in 01/2023 at which time cardiology recommended cardiac cath; however, pt declined and opted only for medical management.   Clinical Impression  Pt admitted with above diagnosis. PTA, pt was modI with functional mobility using rollator and modI with ADLs. She resides alone in a one level house with 4 STE and BHR. A sitter will stay with her from 6:30pm-9:30am 7 days/week. Pt currently with functional limitations due to the deficits listed below (see PT Problem List). She required multi-modal cueing, increased time, and frequent re-direction/re-orientation to task. This may have been impacted by pt's hard of hearing and lack of hearing aids charged and ready to use. Pt required CGA for bed mobility and transfers using RW. Unable to further ambulation d/t pt's nausea. Pt will benefit from acute skilled PT to increase her independence and safety with mobility to allow d/c. Pending pt progress anticipate HHPT as long as increased family assistance could be provided during the day.     If plan is discharge home, recommend the following: A little help with walking and/or transfers;A little help with bathing/dressing/bathroom;Assistance with cooking/housework;Assist for transportation;Help with stairs or ramp for entrance   Can travel by private vehicle        Equipment Recommendations None recommended by PT (Pt already has DME)  Recommendations for Other Services  OT consult    Functional Status Assessment Patient has had a recent decline in their functional status and  demonstrates the ability to make significant improvements in function in a reasonable and predictable amount of time.     Precautions / Restrictions Precautions Precautions: Fall Recall of Precautions/Restrictions: Intact Restrictions Weight Bearing Restrictions Per Provider Order: No      Mobility  Bed Mobility Overal bed mobility: Needs Assistance Bed Mobility: Supine to Sit     Supine to sit: HOB elevated, Used rails, Contact guard     General bed mobility comments: Pt sat up on R side of bed with cues for sequencing. She brought BLE off EOB, used bedrail, and required CGA at trunk to achieve upright. Pt took increased time to scoot fwd til feet supported.    Transfers Overall transfer level: Needs assistance Equipment used: Rolling walker (2 wheels) Transfers: Sit to/from Stand, Bed to chair/wheelchair/BSC Sit to Stand: Contact guard assist   Step pivot transfers: Contact guard assist       General transfer comment: Pt stood from lowest bed height. She was able to power up by pushing with BUE from EOB. Took increased time to achieve erect posture and gain static balance with BUE on RW grips. Transfer to R from bed>recliner chair, slow short steps with CGA at trunk and facilitating hips to turn to be lined up with chair. Good eccentric control. Pt scooted bkwd using armrest.    Ambulation/Gait               General Gait Details: Deferred  Stairs            Wheelchair Mobility     Tilt Bed    Modified Rankin (Stroke Patients Only)       Balance Overall  balance assessment: Needs assistance Sitting-balance support: Feet supported, Bilateral upper extremity supported Sitting balance-Leahy Scale: Fair Sitting balance - Comments: Pt sat EOB with supervision, able to scoot fwd/bkwd with CGA.   Standing balance support: Bilateral upper extremity supported, During functional activity, Reliant on assistive device for balance Standing balance-Leahy Scale:  Poor Standing balance comment: Pt dependent on RW for BUE support when OOB with CGA at trunk for safety.                             Pertinent Vitals/Pain Pain Assessment Pain Assessment: No/denies pain    Home Living Family/patient expects to be discharged to:: Private residence Living Arrangements: Alone Available Help at Discharge: Available PRN/intermittently;Personal care attendant;Family (Pt reports she has a Comptroller from 6:30pm-9:30am everyday, but is debating on getting rid of them as she doesn't need them. Son, DIL, and 2 granddaughters can help as needed.) Type of Home: House Home Access: Stairs to enter Entrance Stairs-Rails: Right;Left;Can reach both Entrance Stairs-Number of Steps: 4   Home Layout: One level Home Equipment: Rollator (4 wheels);BSC/3in1;Shower seat;Toilet riser;Rolling Walker (2 wheels);Other (comment) (Recliner Chair that can raise)      Prior Function Prior Level of Function : Independent/Modified Independent             Mobility Comments: Ambulates with rollator. Pt states she easily ascends/descends stairs at home. Denies falls in the last 48mo. ADLs Comments: ModI with ADLs, requires increased time and intermittent assistance from sitter. Pt states the sitters will set up the shower for her and help her get in/out of shower as there is a small threshold she needs to step over. Pt sits in shower chair while she baths. Pt reports she is able to cook for herself, but her friend will come over a meal prep so she just has to heat up the food. Manages her own medications. Sitter will assist in household management. Relies on Son for transportations.     Extremity/Trunk Assessment   Upper Extremity Assessment Upper Extremity Assessment: Defer to OT evaluation    Lower Extremity Assessment Lower Extremity Assessment: Generalized weakness    Cervical / Trunk Assessment Cervical / Trunk Assessment: Normal  Communication    Communication Communication: Impaired Factors Affecting Communication: Hearing impaired (Pt has hearing aids but the batteries are dead)    Cognition Arousal: Alert Behavior During Therapy: WFL for tasks assessed/performed   PT - Cognitive impairments: No family/caregiver present to determine baseline, Attention, Initiation, Sequencing                       PT - Cognition Comments: Pt A,Ox4. Pt took increased time to process/follow compands likely d/t HOH. She required increased time to complete functional mobility. Pt became distracted and required re-direction/re-orientation to focus on task. Following commands: Impaired Following commands impaired: Follows one step commands with increased time     Cueing Cueing Techniques: Verbal cues, Tactile cues, Gestural cues     General Comments General comments (skin integrity, edema, etc.): Pt greeted on 2L O2, titrated to RA and she maintained SpO2 >94% during session, notified RN. Pt c/o nausea throughout session, provided her with sick bag and notifed RN.    Exercises     Assessment/Plan    PT Assessment Patient needs continued PT services  PT Problem List Decreased strength;Decreased activity tolerance;Decreased balance;Decreased mobility       PT Treatment Interventions Gait training;Stair training;Functional mobility training;Therapeutic activities;Therapeutic  exercise;Balance training;Patient/family education    PT Goals (Current goals can be found in the Care Plan section)  Acute Rehab PT Goals Patient Stated Goal: Return Home PT Goal Formulation: With patient Time For Goal Achievement: 05/25/23 Potential to Achieve Goals: Good    Frequency Min 2X/week     Co-evaluation               AM-PAC PT "6 Clicks" Mobility  Outcome Measure Help needed turning from your back to your side while in a flat bed without using bedrails?: A Little Help needed moving from lying on your back to sitting on the side of a  flat bed without using bedrails?: A Little Help needed moving to and from a bed to a chair (including a wheelchair)?: A Little Help needed standing up from a chair using your arms (e.g., wheelchair or bedside chair)?: A Little Help needed to walk in hospital room?: A Lot Help needed climbing 3-5 steps with a railing? : A Lot 6 Click Score: 16    End of Session Equipment Utilized During Treatment: Gait belt Activity Tolerance: Patient limited by fatigue;Other (comment) (Treatment limited secondary to nausea) Patient left: in chair;with call bell/phone within reach;with chair alarm set Nurse Communication: Mobility status;Other (comment) (Titrated pt to RA. Pt c/o nausea.) PT Visit Diagnosis: Muscle weakness (generalized) (M62.81);Difficulty in walking, not elsewhere classified (R26.2);Unsteadiness on feet (R26.81)    Time: 1610-9604 PT Time Calculation (min) (ACUTE ONLY): 32 min   Charges:   PT Evaluation $PT Eval Moderate Complexity: 1 Mod PT Treatments $Therapeutic Activity: 8-22 mins PT General Charges $$ ACUTE PT VISIT: 1 Visit         Cheri Guppy, PT, DPT Acute Rehabilitation Services Office: [phone] Secure Chat Preferred  Richardson Chiquito 05/11/2023, 12:23 PM

## 2023-05-12 DIAGNOSIS — E43 Unspecified severe protein-calorie malnutrition: Secondary | ICD-10-CM | POA: Insufficient documentation

## 2023-05-12 DIAGNOSIS — I5021 Acute systolic (congestive) heart failure: Secondary | ICD-10-CM

## 2023-05-12 LAB — CBC
HCT: 32.3 % — ABNORMAL LOW (ref 36.0–46.0)
Hemoglobin: 10.6 g/dL — ABNORMAL LOW (ref 12.0–15.0)
MCH: 31.3 pg (ref 26.0–34.0)
MCHC: 32.8 g/dL (ref 30.0–36.0)
MCV: 95.3 fL (ref 80.0–100.0)
Platelets: 207 10*3/uL (ref 150–400)
RBC: 3.39 MIL/uL — ABNORMAL LOW (ref 3.87–5.11)
RDW: 14.7 % (ref 11.5–15.5)
WBC: 9.6 10*3/uL (ref 4.0–10.5)
nRBC: 0 % (ref 0.0–0.2)

## 2023-05-12 LAB — BASIC METABOLIC PANEL
Anion gap: 8 (ref 5–15)
BUN: 24 mg/dL — ABNORMAL HIGH (ref 8–23)
CO2: 21 mmol/L — ABNORMAL LOW (ref 22–32)
Calcium: 8.7 mg/dL — ABNORMAL LOW (ref 8.9–10.3)
Chloride: 107 mmol/L (ref 98–111)
Creatinine, Ser: 0.84 mg/dL (ref 0.44–1.00)
GFR, Estimated: >60 mL/min (ref >60)
Glucose, Bld: 97 mg/dL (ref 70–99)
Potassium: 3.8 mmol/L (ref 3.5–5.1)
Sodium: 136 mmol/L (ref 135–145)

## 2023-05-12 LAB — GLUCOSE, CAPILLARY
Glucose-Capillary: 95 mg/dL (ref 70–99)
Glucose-Capillary: 107 mg/dL — ABNORMAL HIGH (ref 70–99)
Glucose-Capillary: 107 mg/dL — ABNORMAL HIGH (ref 70–99)
Glucose-Capillary: 97 mg/dL (ref 70–99)

## 2023-05-12 LAB — MAGNESIUM: Magnesium: 2.2 mg/dL (ref 1.7–2.4)

## 2023-05-12 NOTE — TOC Progression Note (Addendum)
 Transition of Care [facility]) - Progression Note    Patient Details  Name: Vicki [name] MRN: [id number] Date of Birth: Mar 28, 1923  [facility] CM/SW Contact  Leone Haven, RN Phone Number: 05/12/2023, 10:50 AM  Clinical Narrative:    Per PT eval and OT eval rec HH services.  NCM received call from DIL and son stating they would like for her to go to SNF because she will not have family assistance during the day and she will be alone at night.  NCM spoke with patient regarding this information. She states she does not want to go to a SNF and that she has aides from 6:30 pm to 9:30 pm with Home instead  and her DIL is there  during the day.  NCM informed her that they will have to add more hours to her aides if she needs it.  NCM offered choice.  She states she just recently had [facility] and would like them ,  NCM made referral to [facility] with [facility] for [facility], HHPT, HHOT,HHAIDE and SW.  Awaiting to hear back from [facility].  NCM received call from DIL , stating patient now wants to go to SNF,  NCM spoke with patient and she states she wants to go to [facility].  NCM made CSW aware and Kandee Keen with [facility] aware.    Expected Discharge Plan: Home w Home Health Services Barriers to Discharge: Continued Medical Work up  Expected Discharge Plan and Services In-house Referral: NA Discharge Planning Services: CM Consult   Living arrangements for the past 2 months: Single Family Home                 DME Arranged: N/A DME Agency: NA         HH Agency: NA         Social Determinants of Health (SDOH) Interventions SDOH Screenings   Food Insecurity: No Food Insecurity (05/09/2023)  Housing: Low Risk  (05/09/2023)  Transportation Needs: No Transportation Needs (05/09/2023)  Utilities: Not At Risk (05/09/2023)  Financial Resource Strain: Low Risk  (04/26/2023)   Received from [facility]  Physical Activity: Unknown (09/06/2022)   Received from [facility]  Social Connections:  Moderately Integrated (05/09/2023)  Stress: No Stress Concern Present (01/02/2023)   Received from [facility]  Tobacco Use: Low Risk  (05/08/2023)    Readmission Risk Interventions    05/10/2023    3:32 PM  Readmission Risk Prevention Plan  Transportation Screening Complete  PCP or Specialist Appt within 5-7 Days Complete  Home Care Screening Complete  Medication Review (RN CM) Complete

## 2023-05-12 NOTE — Plan of Care (Signed)

## 2023-05-12 NOTE — Progress Notes (Signed)
    No further episodes of atrial fibrillation, given her age, seems she is optimized on GDMT. Would continue current regimen, with addition of lasix 20mg  daily PRN. Follows with Dr. Chales Abrahams at [facility].   Janice Coffin, NP-C 05/12/2023, 10:42 AM Pager: [phone]

## 2023-05-12 NOTE — NC FL2 (Signed)
 [place] MEDICAID FL2 LEVEL OF CARE FORM     IDENTIFICATION  Patient Name: Vicki [name] Birthdate: 01/05/1924 Sex: female Admission Date (Current Location): 05/08/2023  [facility] and [place] Number:  Producer, television/film/video and Address:  [facility] [place], [address], [place], [place] [address]      Provider Number: [id number]  Attending Physician Name and Address:  Zannie Cove, MD  Relative Name and Phone Number:       Current Level of Care: Hospital Recommended Level of Care: Skilled Nursing Facility Prior Approval Number:    Date Approved/Denied:   PASRR Number: [id number]  Discharge Plan: SNF    Current Diagnoses: Patient Active Problem List   Diagnosis Date Noted   Protein-calorie malnutrition, severe 05/12/2023   Atrial fibrillation with rapid ventricular response (HCC) 05/10/2023   Stress-induced cardiomyopathy 05/10/2023   Non-ST elevation (NSTEMI) myocardial infarction [facility]) 05/09/2023   CHF (congestive heart failure) (HCC) 05/08/2023   NSTEMI (non-ST elevated myocardial infarction) (HCC) 02/06/2023   Acute on chronic combined systolic and diastolic CHF (congestive heart failure) (HCC) 02/06/2023   Essential hypertension 02/06/2023   Hypothyroidism 02/06/2023    Orientation RESPIRATION BLADDER Height & Weight     Self, Time, Situation, Place  Normal External catheter, Incontinent Weight: 143 lb 11.8 oz (65.2 kg) Height:  5\' 2"  (157.5 cm)  BEHAVIORAL SYMPTOMS/MOOD NEUROLOGICAL BOWEL NUTRITION STATUS      Continent Diet (See dc summary)  AMBULATORY STATUS COMMUNICATION OF NEEDS Skin   Extensive Assist Verbally Normal                       Personal Care Assistance Level of Assistance  Bathing, Feeding, Dressing Bathing Assistance: Limited assistance Feeding assistance: Limited assistance Dressing Assistance: Limited assistance     Functional Limitations Info  Sight, Hearing, Speech Sight Info: Impaired (Vision  impaired) Hearing Info: Impaired (Hearing impaired) Speech Info: Adequate    SPECIAL CARE FACTORS FREQUENCY  PT (By licensed PT), OT (By licensed OT)     PT Frequency: 5x week OT Frequency: 5x week            Contractures Contractures Info: Not present    Additional Factors Info  Code Status, Allergies Code Status Info: DNR Limited Allergies Info: Codeine, Latex, Penicillins, Sulfa Antibiotics, Thiopental, Statins           Current Medications (05/12/2023):  This is the current hospital active medication list Current Facility-Administered Medications  Medication Dose Route Frequency Provider Last Rate Last Admin   acetaminophen (TYLENOL) tablet 650 mg  650 mg Oral Q6H PRN Lurline Del, MD   650 mg at 05/11/23 2135   Or   acetaminophen (TYLENOL) suppository 650 mg  650 mg Rectal Q6H PRN Lurline Del, MD       amiodarone (PACERONE) tablet 200 mg  200 mg Oral BID Marykay Lex, MD   200 mg at 05/12/23 0919   aspirin EC tablet 81 mg  81 mg Oral Daily Azalee Course, [place]   81 mg at 05/12/23 0919   carvedilol (COREG) tablet 3.125 mg  3.125 mg Oral BID WC Marykay Lex, MD   3.125 mg at 05/12/23 0920   isosorbide mononitrate (IMDUR) 24 hr tablet 30 mg  30 mg Oral Daily Azalee Course, [place]   30 mg at 05/12/23 0919   levalbuterol (XOPENEX) nebulizer solution 0.63 mg  0.63 mg Nebulization Q6H PRN Lurline Del, MD  levothyroxine (SYNTHROID) tablet 50 mcg  50 mcg Oral Daily Azalee Course, [place]   50 mcg at 05/12/23 0516   losartan (COZAAR) tablet 12.5 mg  12.5 mg Oral Daily Azalee Course, PA   12.5 mg at 05/12/23 0919   melatonin tablet 5 mg  5 mg Oral QHS PRN Gery Pray, MD   5 mg at 05/11/23 2135   nitroGLYCERIN (NITROSTAT) SL tablet 0.4 mg  0.4 mg Sublingual Q5 min PRN Skip Mayer A, MD   0.4 mg at 05/10/23 1610   promethazine (PHENERGAN) tablet 12.5 mg  12.5 mg Oral Q6H PRN Lurline Del, MD   12.5 mg at 05/12/23 0017   rosuvastatin (CRESTOR) tablet 10 mg   10 mg Oral Daily Marykay Lex, MD   10 mg at 05/12/23 9604     Discharge Medications: Please see discharge summary for a list of discharge medications.  Relevant Imaging Results:  Relevant Lab Results:   Additional Information SSN [id number] [place], [place]

## 2023-05-12 NOTE — Progress Notes (Addendum)
 Initial Nutrition Assessment  DOCUMENTATION CODES:   Severe malnutrition in context of chronic illness  INTERVENTION:  Liberalize diet to regular diet, given advanced age, and to promote adequate po intake   Magic cup TID with meals, each supplement provides 290 kcal and 9 grams of protein   NUTRITION DIAGNOSIS:   Severe Malnutrition related to chronic illness (CHF) as evidenced by severe muscle depletion, severe fat depletion.  GOAL:   Patient will meet greater than or equal to 90% of their needs  MONITOR:   PO intake  REASON FOR ASSESSMENT:   Consult Diet education (Patient has systolic chf. Needs education about dietary choices)  ASSESSMENT:   Pt admitted for SOB and chest discomfort, and treated for ACS. Pt with PMH of HTN, Hypothyroidism, GERD, HLD, rheumatoid arthritis, CKD 3b, HFrEF, recent NSTEMI in 01/2023.   Pt reports not liking the food in the hospital but that she still eats most of the meals. Documented meal competitions have an average of 69% in last 3 days. Pt reports eating 3 meals at home and has a great appetite. She has raisin brain and green tea for breakfast, peanut butter and a slim fast for lunch, and food that neighbors/family bring or something ready to eat. Pt reports that she does ot use any salt at home when cooking or eating any prepared foods. Pt will consume soup sometimes but that is the only reported food item high in sodium she reports having. She reports not liking any nutrition supplements other than slim fast. Pt not agreeable to trying any nutrition supplements in hospital and kept repeating that she "is leaving here today and doesn't need anything". Pt became agitated towards middle of visit after nurse asked if she wants to go to SNF. Pt told nurse she will not be going to a SNF and asked for everyone to stop asking her that. Per discussion with nurse, pt's children do not want her living alone without care and pt does not agree with it.  Pt  reports living alone and doing just fine on her own. Pt seems resistant to help at home. She reports she "does not need anyone helping her do anything". Pt reports using a walker to get around at home and moves around a lot throughout the day.  RD was consulted for CHF diet education. Pt was not agreeable to any education. Pt denied wanting the handout provided. Pt reported she eats fine and just wants to go home. Noted pt requested "no salt diet" in diet orders, however, pt denies ever requesting this.  RD will liberalize diet to regular.  Admit weight: 145.28 lbs Current weight: 143.74 lbs  Meds reviewed.  Labs: CBG's range from 95-133 in last 24 hours, BUN 24, A1C 5.4  NUTRITION - FOCUSED PHYSICAL EXAM:  Flowsheet Row Most Recent Value  Orbital Region Moderate depletion  Upper Arm Region Severe depletion  Thoracic and Lumbar Region Mild depletion  Buccal Region Severe depletion  Temple Region Severe depletion  Clavicle Bone Region Severe depletion  Clavicle and Acromion Bone Region Severe depletion  Scapular Bone Region Severe depletion  Dorsal Hand Severe depletion  Patellar Region Unable to assess  Anterior Thigh Region Unable to assess  [bilateral lower extremity edema]  Posterior Calf Region Unable to assess  Edema (RD Assessment) Moderate  Hair Reviewed  Eyes Reviewed  Mouth Reviewed  [no teeth/dentures]  Skin Reviewed  Nails Reviewed       Diet Order:   Diet Order  Diet heart healthy/carb modified Room service appropriate? Yes; Fluid consistency: Thin; Fluid restriction: 1200 mL Fluid  Diet effective now                   EDUCATION NEEDS:   Education needs have been addressed  Skin:  Skin Assessment: Reviewed RN Assessment  Last BM:  3/10  Height:   Ht Readings from Last 1 Encounters:  05/09/23 5\' 2"  (1.575 m)    Weight:   Wt Readings from Last 1 Encounters:  05/12/23 65.2 kg    Ideal Body Weight:  50 kg  BMI:  Body mass index  is 26.29 kg/m.  Estimated Nutritional Needs:   Kcal:  1500-1700  Protein:  75-95 g  Fluid:  > 1.5 L    Maceo Pro, MS Dietetic Intern

## 2023-05-12 NOTE — TOC Progression Note (Addendum)
 Transition of Care [facility]) - Progression Note    Patient Details  Name: Vicki [name] MRN: [id number] Date of Birth: 1923/04/02  [facility] CM/SW Contact  Michaela Corner, [place] Phone Number: 05/12/2023, 11:24 AM  Clinical Narrative:   CSW spoke with patients son, [name], about his families inquiry for pt to go to SNF at [facility]. Per the family, pt has had multiple falls at home and they would like her to go to STR to buildup her strength. RNCM notified CSW when family stated the same information. Per RNCM, PT has been contacted to re eval patient. CSW awaiting updated recs by PT to be able to do insurance auth. CSW contacted Rhonda at [facility] to check on bed availability, per Bjorn Loser they have a bed.   4:50 PM CSW spoke with pts DIL about PT not changing recs to SNF. DIL  does not want patient to discharge home. She stated she's afraid to take her home and would really like PT to reconsider. DIL stated no one is home to take care of her and she would really like to try to have pt go to [facility]. DIL stated pt will have to stay in the hospital if she does not dc to SNF. CSW notified treatment team.  TOC will continue to follow.  Expected Discharge Plan: Home w Home Health Services Barriers to Discharge: Continued Medical Work up  Expected Discharge Plan and Services In-house Referral: NA Discharge Planning Services: CM Consult   Living arrangements for the past 2 months: Single Family Home                 DME Arranged: N/A DME Agency: NA         HH Agency: NA         Social Determinants of Health (SDOH) Interventions SDOH Screenings   Food Insecurity: No Food Insecurity (05/09/2023)  Housing: Low Risk  (05/09/2023)  Transportation Needs: No Transportation Needs (05/09/2023)  Utilities: Not At Risk (05/09/2023)  Financial Resource Strain: Low Risk  (04/26/2023)   Received from [facility]  Physical Activity: Unknown (09/06/2022)   Received from  [facility]  Social Connections: Moderately Integrated (05/09/2023)  Stress: No Stress Concern Present (01/02/2023)   Received from [facility]  Tobacco Use: Low Risk  (05/08/2023)    Readmission Risk Interventions    05/10/2023    3:32 PM  Readmission Risk Prevention Plan  Transportation Screening Complete  PCP or Specialist Appt within 5-7 Days Complete  Home Care Screening Complete  Medication Review (RN CM) Complete

## 2023-05-12 NOTE — Evaluation (Signed)
 Occupational Therapy Evaluation Patient Details Name: Vicki [name] MRN: [id number] DOB: 1923-11-11 Today's Date: 05/12/2023   History of Present Illness   Vicki [name] is a 89 y.o. F admitted 05/08/23 with NSTEMI and acute on chronic CHF. Pt presented with SOB, chest pain, and elevated troponin and BNP. CXR with mild pulmonary edema. PMH significant for HTN, Hypothyroidism, GERD, HLD, rheumatoid arthritis, CKD 3b, HFrEF, recent NSTEMI in 01/2023 at which time cardiology recommended cardiac cath; however, pt declined and opted only for medical management.     Clinical Impressions Prior to this admission, patient was independent and living alone, with a sitter that would come in the evening to help if she needed assist for showering, housekeeping, and other higher level tasks. Patient was able to cook simple meals and to manage her own medications. Session limited with regard to funcitonal mobility due to BM urgency but able to complete multiple sit<>stands from [facility] ranging from CGA to min A. Patient mod A for ADL management (soley due to [facility] urgency and needing increased assist) but will likely progress quickly. Given prior level, OT recommending HHOT and return to least restrictive environment. OT will follow acutely.     If plan is discharge home, recommend the following:   A little help with walking and/or transfers;A little help with bathing/dressing/bathroom;Assist for transportation;Help with stairs or ramp for entrance (initially)     Functional Status Assessment   Patient has had a recent decline in their functional status and demonstrates the ability to make significant improvements in function in a reasonable and predictable amount of time.     Equipment Recommendations   None recommended by OT     Recommendations for Other Services         Precautions/Restrictions   Precautions Precautions: Fall Recall of Precautions/Restrictions: Intact Restrictions Weight  Bearing Restrictions Per Provider Order: No     Mobility Bed Mobility Overal bed mobility: Needs Assistance Bed Mobility: Supine to Sit, Sit to Supine     Supine to sit: HOB elevated, Used rails, Supervision Sit to supine: Supervision   General bed mobility comments: Minimal increased time    Transfers Overall transfer level: Needs assistance Equipment used: Rolling walker (2 wheels) Transfers: Sit to/from Stand, Bed to chair/wheelchair/BSC Sit to Stand: Contact guard assist Stand pivot transfers: Min assist         General transfer comment: CGA to stand from EOB, and up to min A to complete stand pivot to BSC, multiple sit<>stands for peri-care and lower bosy dressing (CGA to min A)      Balance Overall balance assessment: Needs assistance Sitting-balance support: Feet supported, Bilateral upper extremity supported Sitting balance-Leahy Scale: Fair Sitting balance - Comments: Pt sat EOB with supervision, able to scoot fwd/bkwd with CGA.   Standing balance support: Bilateral upper extremity supported, During functional activity, Reliant on assistive device for balance Standing balance-Leahy Scale: Poor Standing balance comment: Pt dependent on RW for BUE support when OOB with CGA at trunk for safety.                           ADL either performed or assessed with clinical judgement   ADL Overall ADL's : Needs assistance/impaired Eating/Feeding: Set up;Sitting   Grooming: Set up;Sitting   Upper Body Bathing: Set up;Sitting   Lower Body Bathing: Minimal assistance;Sitting/lateral leans;Sit to/from stand   Upper Body Dressing : Set up;Sitting   Lower Body Dressing: Minimal assistance;Moderate assistance;Sitting/lateral leans;Sit to/from  stand   Toilet Transfer: Minimal assistance;Stand-pivot;BSC/3in1;Rolling walker (2 wheels) Toilet Transfer Details (indicate cue type and reason): could not progress to toilet due to BM urgency, multiple sit<>stands from  [facility] Toileting- Clothing Manipulation and Hygiene: Total assistance;Sitting/lateral lean;Sit to/from stand Toileting - Clothing Manipulation Details (indicate cue type and reason): unable to complete in standing     Functional mobility during ADLs: Moderate assistance;Cueing for safety;Cueing for sequencing;Rolling walker (2 wheels) General ADL Comments: Prior to this admission, patient was independent and living alone, with a sitter that would come in the evening to help if she needed assist for showering, housekeeping, and other higher level tasks. Patient was able to cook simple meals and to manage her own medications. Session limited with regard to funcitonal mobility due to BM urgency but able to complete multiple sit<>stands from [facility] ranging from CGA to min A. Patient mod A for ADL management (soley due to [facility] urgency and needing increased assist) but will likely progress quickly. Given prior level, OT recommending HHOT and return to least restrictive environment. OT will follow acutely.     Vision Baseline Vision/History: 1 Wears glasses Ability to See in Adequate Light: 0 Adequate Patient Visual Report: No change from baseline Vision Assessment?: No apparent visual deficits     Perception Perception: Not tested       Praxis Praxis: Not tested       Pertinent Vitals/Pain Pain Assessment Pain Assessment: Faces Faces Pain Scale: Hurts a little bit Pain Location: BLEs Pain Descriptors / Indicators: Sore Pain Intervention(s): Limited activity within patient's tolerance, Monitored during session, Repositioned     Extremity/Trunk Assessment Upper Extremity Assessment Upper Extremity Assessment: Generalized weakness   Lower Extremity Assessment Lower Extremity Assessment: Defer to PT evaluation;Generalized weakness   Cervical / Trunk Assessment Cervical / Trunk Assessment: Normal   Communication Communication Communication: Impaired Factors Affecting Communication: Hearing  impaired (Pt has hearing aids but the batteries are dead)   Cognition Arousal: Alert Behavior During Therapy: WFL for tasks assessed/performed Cognition: No apparent impairments             OT - Cognition Comments: Oriented, but repeats herself, likely due to [facility]                 Following commands: Intact Following commands impaired: Follows one step commands with increased time     Cueing  General Comments   Cueing Techniques: Verbal cues;Tactile cues;Gestural cues  VSS on RA   Exercises     Shoulder Instructions      Home Living Family/patient expects to be discharged to:: Private residence Living Arrangements: Alone Available Help at Discharge: Available PRN/intermittently;Personal care attendant;Family (Pt reports she has a Comptroller from 6:30pm-9:30am everyday, but is debating on getting rid of them as she doesn't need them. Son, DIL, and 2 granddaughters can help as needed.) Type of Home: House Home Access: Stairs to enter [employer] of Steps: 4 Entrance Stairs-Rails: Right;Left;Can reach both Home Layout: One level     Bathroom Shower/Tub: Producer, television/film/video: Handicapped height     Home Equipment: Rollator (4 wheels);BSC/3in1;Shower seat;Toilet riser;Rolling Walker (2 wheels);Other (comment) (Recliner Chair that can raise)          Prior Functioning/Environment Prior Level of Function : Independent/Modified Independent             Mobility Comments: Ambulates with rollator. Pt states she easily ascends/descends stairs at home. Denies falls in the last 87mo. ADLs Comments: ModI with ADLs, requires increased time  and intermittent assistance from sitter. Pt states the sitters will set up the shower for her and help her get in/out of shower as there is a small threshold she needs to step over. Pt sits in shower chair while she baths. Pt reports she is able to cook for herself, but her friend will come over a meal prep so she  just has to heat up the food. Manages her own medications. Sitter will assist in household management. Relies on Son for transportations.    OT Problem List: Decreased strength;Decreased activity tolerance;Impaired balance (sitting and/or standing);Decreased safety awareness;Cardiopulmonary status limiting activity   OT Treatment/Interventions: Self-care/ADL training;Therapeutic exercise;Energy conservation;DME and/or AE instruction;Manual therapy;Therapeutic activities;Patient/family education;Balance training      OT Goals(Current goals can be found in the care plan section)   Acute Rehab OT Goals Patient Stated Goal: to get some sleep OT Goal Formulation: With patient Time For Goal Achievement: 05/26/23 Potential to Achieve Goals: Good ADL Goals Pt Will Perform Lower Body Bathing: with modified independence;sitting/lateral leans;sit to/from stand Pt Will Perform Lower Body Dressing: with modified independence;sit to/from stand;sitting/lateral leans Pt Will Transfer to Toilet: with modified independence;ambulating;regular height toilet Pt Will Perform Toileting - Clothing Manipulation and hygiene: with modified independence;sitting/lateral leans;sit to/from stand Additional ADL Goal #1: Patient will be able to complete functional task in standing for 3 minutes prior to needing seated rest break in order to increase overall activity tolerance.   OT Frequency:  Min 2X/week    Co-evaluation              AM-PAC OT "6 Clicks" Daily Activity     Outcome Measure Help from another person eating meals?: A Little Help from another person taking care of personal grooming?: A Little Help from another person toileting, which includes using toliet, bedpan, or urinal?: A Lot Help from another person bathing (including washing, rinsing, drying)?: A Little Help from another person to put on and taking off regular upper body clothing?: A Little Help from another person to put on and taking  off regular lower body clothing?: A Little 6 Click Score: 17   End of Session Equipment Utilized During Treatment: Rolling walker (2 wheels) Nurse Communication: Mobility status  Activity Tolerance: Patient tolerated treatment well Patient left: in bed;with call bell/phone within reach;with bed alarm set  OT Visit Diagnosis: Unsteadiness on feet (R26.81);Muscle weakness (generalized) (M62.81);Other abnormalities of gait and mobility (R26.89)                Time: 1610-9604 OT Time Calculation (min): 45 min Charges:  OT General Charges $OT Visit: 1 Visit OT Evaluation $OT Eval Moderate Complexity: 1 Mod OT Treatments $Self Care/Home Management : 23-37 mins  Pollyann Glen E. Donnalynn Wheeless, OTR/L Acute Rehabilitation Services [phone]   Cherlyn Cushing 05/12/2023, 10:15 AM

## 2023-05-12 NOTE — Care Management Important Message (Signed)
 Important Message  Patient Details  Name: Vicki [name] MRN: [id number] Date of Birth: 1924-02-13   Important Message Given:  Yes - Medicare IM     Renie Ora 05/12/2023, 9:56 AM

## 2023-05-12 NOTE — Progress Notes (Signed)
 Physical Therapy Treatment Patient Details Name: Vicki [name] MRN: [id number] DOB: [birth year] Today's Date: 05/12/2023   History of Present Illness Vicki [name] is a 89 y.o. F admitted 05/08/23 with NSTEMI and acute on chronic CHF. Pt presented with SOB, chest pain, and elevated troponin and BNP. CXR with mild pulmonary edema. PMH significant for HTN, Hypothyroidism, GERD, HLD, rheumatoid arthritis, CKD 3b, HFrEF, recent NSTEMI in 01/2023 at which time cardiology recommended cardiac cath; however, pt declined and opted only for medical management.    PT Comments  Pt tolerates treatment very well, ambulating for increased distances and not requiring physical assistance for mobility during session. Pt has some difficulty managing wider rollator through tight quarters in room, although PT would not anticipate this issue at home as the pt's rollator is more narrow. Pt reports increased fatigue with mobility compared to baseline. Pt also denies any recent falls, reporting her last fall to be in September of 2024. PT is in agreement with OT assessment from this morning, PT services in the home appear to be the most appropriate at this time. Pt is encouraged to mobilize frequently in an effort to continue to improve her activity tolerance.    If plan is discharge home, recommend the following: Assistance with cooking/housework;Assist for transportation;Help with stairs or ramp for entrance   Can travel by private vehicle        Equipment Recommendations  None recommended by PT    Recommendations for Other Services       Precautions / Restrictions Precautions Precautions: Fall Recall of Precautions/Restrictions: Intact Restrictions Weight Bearing Restrictions Per Provider Order: No     Mobility  Bed Mobility Overal bed mobility: Needs Assistance Bed Mobility: Supine to Sit     Supine to sit: Supervision          Transfers Overall transfer level: Needs assistance Equipment  used: Rollator (4 wheels) Transfers: Sit to/from Stand Sit to Stand: Supervision                Ambulation/Gait Ambulation/Gait assistance: Supervision Gait Distance (Feet): 100 Feet Assistive device: Rollator (4 wheels) Gait Pattern/deviations: Step-through pattern Gait velocity: reduced Gait velocity interpretation: <1.8 ft/sec, indicate of risk for recurrent falls   General Gait Details: steady step-through gait. Pt with difficulty at times guiding wider bariatric rollator through tight hospital room but is able to correct when bumping into objects (bariatric rollator utilized as it was the only one available on the unit at the time of session)   Stairs             Wheelchair Mobility     Tilt Bed    Modified Rankin (Stroke Patients Only)       Balance Overall balance assessment: Needs assistance Sitting-balance support: No upper extremity supported, Feet supported Sitting balance-Leahy Scale: Good     Standing balance support: Bilateral upper extremity supported, Reliant on assistive device for balance Standing balance-Leahy Scale: Poor                              Communication Communication Communication: Impaired Factors Affecting Communication: Hearing impaired  Cognition Arousal: Alert Behavior During Therapy: WFL for tasks assessed/performed   PT - Cognitive impairments: No family/caregiver present to determine baseline (anticipate pt is likely near cognitive baseline, no significant deficits in cognition noted)  Following commands: Intact      Cueing    Exercises      General Comments General comments (skin integrity, edema, etc.): VSS on RA, SpO2 maintained in mid to high 90s      Pertinent Vitals/Pain Pain Assessment Pain Assessment: No/denies pain    Home Living                          Prior Function            PT Goals (current goals can now be found in the care  plan section) Acute Rehab PT Goals Patient Stated Goal: Return Home Progress towards PT goals: Progressing toward goals    Frequency    Min 2X/week      PT Plan      Co-evaluation              AM-PAC PT "6 Clicks" Mobility   Outcome Measure  Help needed turning from your back to your side while in a flat bed without using bedrails?: A Little Help needed moving from lying on your back to sitting on the side of a flat bed without using bedrails?: A Little Help needed moving to and from a bed to a chair (including a wheelchair)?: A Little Help needed standing up from a chair using your arms (e.g., wheelchair or bedside chair)?: A Little Help needed to walk in hospital room?: A Little Help needed climbing 3-5 steps with a railing? : A Little 6 Click Score: 18    End of Session Equipment Utilized During Treatment: Gait belt Activity Tolerance: Patient tolerated treatment well Patient left: in chair;with call bell/phone within reach;with chair alarm set Nurse Communication: Mobility status PT Visit Diagnosis: Muscle weakness (generalized) (M62.81);Difficulty in walking, not elsewhere classified (R26.2);Unsteadiness on feet (R26.81)     Time: 1417-1440 PT Time Calculation (min) (ACUTE ONLY): 23 min  Charges:    $Gait Training: 8-22 mins $Therapeutic Activity: 8-22 mins                       Arlyss Gandy, PT, DPT Acute Rehabilitation Office [phone]    Arlyss Gandy 05/12/2023, 2:55 PM

## 2023-05-12 NOTE — Progress Notes (Addendum)
 PROGRESS NOTE    Vicki [name]  ZOX:[id number] DOB: Jul 17, 1923 DOA: 05/08/2023 PCP: Deloris Ping, MD   89 year old female with history of hypertension, hypothyroidism, GERD, hyperlipidemia, rheumatoid arthritis, CKD stage IIIb, chronic systolic heart failure, recent non-STEMI in 01/2023 during which patient declined cardiac catheterization and opted for medical management only presented with shortness of breath and chest discomfort. On presentation, WBC was 13.2, high-sensitivity troponins were 66 and then subsequently 2968. COVID/influenza/RSV PCR negative. Chest x-ray for possible pulmonary edema along with atelectasis or pneumonia. She was started on IV diuretics along with heparin drip. Cardiology was consulted.   Subjective: Did not sleep much last night, otherwise denies other complaints, breathing is fair  Assessment and Plan:  Acute on chronic systolic heart failure Hypertension -Presented with chest discomfort and shortness of breath and was found to have elevated troponins as above and chest x-ray as above. -2D echo showed EF of 20 to 25%.  -Not volume overloaded at this time -Continue carvedilol, losartan, has not needed a diuretic currently, avoid SGLT2i -add PRN lasix at DC  NSTEMI Possible Takotsubo cardiomyopathy -Troponin peaked at 2968 -Cards following, echo noted EF of 20-25% with global hypokinesis?  Takotsubo -Conservative management with advanced age and frailty, patient also declined invasive workup -Continue carvedilol, losartan, Imdur -History of intolerance to antiplatelet agents?  Plavix, report of significant bruising and bleeding in the past, continue aspirin 81 Mg   Paroxysmal A-fib with RVR -Heart rates going up to the 140s on 05/10/2023 morning with hypotension.  Patient was started on IV amiodarone by cardiology and subsequently patient converted to sinus rhythm.  Amiodarone has been switched to oral  -Continue aspirin, history of bleeding  complications in the past, plan to defer anticoagulation at this time   Hypothyroidism--continue levothyroxine   Leukocytosis -Resolved   Hyponatremia -Improved   Hypokalemia -Replaced   Goals of care -Now DNR.  Palliative care meeting completed, she wishes to continue conservative management -Anticipate need for hospice in the near future   Physical deconditioning -PT eval completed, home health services recommended   DVT prophylaxis: SCDs Code Status: DNR Family Communication: None at bedside Disposition Plan: Home with home health services tomorrow   consultants:    Procedures:   Antimicrobials:    Objective: Vitals:   05/11/23 2200 05/12/23 0442 05/12/23 0500 05/12/23 0735  BP: 112/80 (!) 105/59  116/64  Pulse: 70 72  71  Resp: 13 18  15   Temp: 98 F (36.7 C) 98.6 F (37 C)  97.8 F (36.6 C)  TempSrc: Oral Axillary  Oral  SpO2: 97% 98%  99%  Weight:   65.2 kg   Height:        Intake/Output Summary (Last 24 hours) at 05/12/2023 1116 Last data filed at 05/12/2023 0919 Gross per 24 hour  Intake 600 ml  Output 750 ml  Net -150 ml   Filed Weights   05/10/23 0500 05/11/23 0500 05/12/23 0500  Weight: 65.9 kg 63 kg 65.2 kg    Examination:  Gen: Awake, Alert, Oriented X 3, frail thinly built elderly female HEENT: no JVD Lungs: Good air movement bilaterally, CTAB CVS: S1S2/RRR Abd: soft, Non tender, non distended, BS present Extremities: No edema Skin: no new rashes on exposed skin     Data Reviewed:   CBC: Recent Labs  Lab 05/08/23 2006 05/09/23 0513 05/10/23 0253 05/11/23 0259 05/12/23 0302  WBC 13.2* 12.0* 11.5* 8.3 9.6  NEUTROABS  --   --  7.0  --   --  HGB 14.0 13.7 11.3* 10.4* 10.6*  HCT 42.8 41.1 33.6* 31.7* 32.3*  MCV 95.1 92.6 92.6 93.8 95.3  PLT 285 256 222 212 207   Basic Metabolic Panel: Recent Labs  Lab 05/08/23 2006 05/09/23 0513 05/10/23 0253 05/11/23 0259 05/12/23 0302  NA 132* 134* 136 137 136  K 4.1 3.6 3.5  3.4* 3.8  CL 97* 104 105 105 107  CO2 20* 21* 23 19* 21*  GLUCOSE 254* 146* 118* 104* 97  BUN 36* 32* 51* 35* 24*  CREATININE 1.09* 0.90 0.95 0.96 0.84  CALCIUM 9.7 9.1 8.5* 8.7* 8.7*  MG  --   --  2.2 2.2 2.2   GFR: Estimated Creatinine Clearance: 33.9 mL/min (by C-G formula based on SCr of 0.84 mg/dL). Liver Function Tests: Recent Labs  Lab 05/09/23 0513  AST 56*  ALT 39  ALKPHOS 42  BILITOT 0.4  PROT 6.4*  ALBUMIN 4.0   No results for input(s): "LIPASE", "AMYLASE" in the last 168 hours. No results for input(s): "AMMONIA" in the last 168 hours. Coagulation Profile: No results for input(s): "INR", "PROTIME" in the last 168 hours. Cardiac Enzymes: No results for input(s): "CKTOTAL", "CKMB", "CKMBINDEX", "TROPONINI" in the last 168 hours. BNP (last 3 results) No results for input(s): "PROBNP" in the last 8760 hours. HbA1C: No results for input(s): "HGBA1C" in the last 72 hours. CBG: Recent Labs  Lab 05/11/23 0628 05/11/23 1040 05/11/23 1630 05/11/23 2104 05/12/23 0611  GLUCAP 101* 112* 96 133* 95   Lipid Profile: No results for input(s): "CHOL", "HDL", "LDLCALC", "TRIG", "CHOLHDL", "LDLDIRECT" in the last 72 hours. Thyroid Function Tests: No results for input(s): "TSH", "T4TOTAL", "FREET4", "T3FREE", "THYROIDAB" in the last 72 hours. Anemia Panel: No results for input(s): "VITAMINB12", "FOLATE", "FERRITIN", "TIBC", "IRON", "RETICCTPCT" in the last 72 hours. Urine analysis:    Component Value Date/Time   COLORURINE COLORLESS (A) 05/09/2023 0357   APPEARANCEUR CLEAR 05/09/2023 0357   LABSPEC 1.004 (L) 05/09/2023 0357   PHURINE 7.0 05/09/2023 0357   GLUCOSEU NEGATIVE 05/09/2023 0357   HGBUR NEGATIVE 05/09/2023 0357   BILIRUBINUR NEGATIVE 05/09/2023 0357   KETONESUR NEGATIVE 05/09/2023 0357   PROTEINUR NEGATIVE 05/09/2023 0357   NITRITE NEGATIVE 05/09/2023 0357   LEUKOCYTESUR NEGATIVE 05/09/2023 0357   Sepsis  Labs: @LABRCNTIP (procalcitonin:4,lacticidven:4)  ) Recent Results (from the past 240 hours)  Resp panel by RT-PCR (RSV, Flu A&B, Covid) Anterior Nasal Swab     Status: None   Collection Time: 05/08/23  8:10 PM   Specimen: Anterior Nasal Swab  Result Value Ref Range Status   SARS Coronavirus 2 by RT PCR NEGATIVE NEGATIVE Final   Influenza A by PCR NEGATIVE NEGATIVE Final   Influenza B by PCR NEGATIVE NEGATIVE Final    Comment: (NOTE) The Xpert Xpress SARS-CoV-2/FLU/RSV plus assay is intended as an aid in the diagnosis of influenza from Nasopharyngeal swab specimens and should not be used as a sole basis for treatment. Nasal washings and aspirates are unacceptable for Xpert Xpress SARS-CoV-2/FLU/RSV testing.  Fact Sheet for Patients: BloggerCourse.com  Fact Sheet for Healthcare Providers: SeriousBroker.it  This test is not yet approved or cleared by the [place] FDA and has been authorized for detection and/or diagnosis of SARS-CoV-2 by FDA under an Emergency Use Authorization (EUA). This EUA will remain in effect (meaning this test can be used) for the duration of the COVID-19 declaration under Section 564(b)(1) of the Act, 21 U.S.C. section 360bbb-3(b)(1), unless the authorization is terminated or revoked.     Resp  Syncytial Virus by PCR NEGATIVE NEGATIVE Final    Comment: (NOTE) Fact Sheet for Patients: BloggerCourse.com  Fact Sheet for Healthcare Providers: SeriousBroker.it  This test is not yet approved or cleared by the [place] FDA and has been authorized for detection and/or diagnosis of SARS-CoV-2 by FDA under an Emergency Use Authorization (EUA). This EUA will remain in effect (meaning this test can be used) for the duration of the COVID-19 declaration under Section 564(b)(1) of the Act, 21 U.S.C. section 360bbb-3(b)(1), unless the authorization is  terminated or revoked.  Performed at [facility], [address], [place], [place] [address]      Radiology Studies: No results found.   Scheduled Meds:  amiodarone  200 mg Oral BID   aspirin EC  81 mg Oral Daily   carvedilol  3.125 mg Oral BID WC   isosorbide mononitrate  30 mg Oral Daily   levothyroxine  50 mcg Oral Daily   losartan  12.5 mg Oral Daily   rosuvastatin  10 mg Oral Daily   Continuous Infusions:   LOS: 4 days    Time spent:    Zannie Cove, MD [facility]   05/12/2023, 11:16 AM

## 2023-05-13 ENCOUNTER — Other Ambulatory Visit (HOSPITAL_COMMUNITY): Payer: Self-pay

## 2023-05-13 DIAGNOSIS — I5023 Acute on chronic systolic (congestive) heart failure: Secondary | ICD-10-CM

## 2023-05-13 LAB — BASIC METABOLIC PANEL
Anion gap: 8 (ref 5–15)
BUN: 18 mg/dL (ref 8–23)
CO2: 20 mmol/L — ABNORMAL LOW (ref 22–32)
Calcium: 8.7 mg/dL — ABNORMAL LOW (ref 8.9–10.3)
Chloride: 109 mmol/L (ref 98–111)
Creatinine, Ser: 0.86 mg/dL (ref 0.44–1.00)
GFR, Estimated: >60 mL/min (ref >60)
Glucose, Bld: 93 mg/dL (ref 70–99)
Potassium: 3.8 mmol/L (ref 3.5–5.1)
Sodium: 137 mmol/L (ref 135–145)

## 2023-05-13 LAB — CBC
HCT: 31.2 % — ABNORMAL LOW (ref 36.0–46.0)
Hemoglobin: 10.3 g/dL — ABNORMAL LOW (ref 12.0–15.0)
MCH: 31.2 pg (ref 26.0–34.0)
MCHC: 33 g/dL (ref 30.0–36.0)
MCV: 94.5 fL (ref 80.0–100.0)
Platelets: 214 10*3/uL (ref 150–400)
RBC: 3.3 MIL/uL — ABNORMAL LOW (ref 3.87–5.11)
RDW: 14.8 % (ref 11.5–15.5)
WBC: 10.5 10*3/uL (ref 4.0–10.5)
nRBC: 0 % (ref 0.0–0.2)

## 2023-05-13 LAB — MAGNESIUM: Magnesium: 2.2 mg/dL (ref 1.7–2.4)

## 2023-05-13 LAB — GLUCOSE, CAPILLARY
Glucose-Capillary: 96 mg/dL (ref 70–99)
Glucose-Capillary: 93 mg/dL (ref 70–99)

## 2023-05-13 MED ORDER — AMIODARONE HCL 200 MG PO TABS
ORAL_TABLET | ORAL | 0 refills | Status: DC
  Filled 2023-05-13: qty 60, 30d supply, fill #0
  Filled 2023-05-13: qty 44, 37d supply, fill #0

## 2023-05-13 MED ORDER — CARVEDILOL 3.125 MG PO TABS
3.1250 mg | ORAL_TABLET | Freq: Two times a day (BID) | ORAL | 1 refills | Status: DC
  Filled 2023-05-13 – 2023-06-07 (×2): qty 60, 30d supply, fill #0

## 2023-05-13 NOTE — TOC Transition Note (Addendum)
 Transition of Care [facility]) - Discharge Note   Patient Details  Name: Vicki [name] MRN: [id number] Date of Birth: 10-26-1923  [facility]) CM/SW Contact:  Leone Haven, RN Phone Number: 05/13/2023, 10:33 AM   Clinical Narrative:    For dc today, per pt she does not qualify for SNF and needs HH services.  NCM and CSW spoke with son and DIL, they are in agreement with patient going home with [facility].  Per DIL they have [facility],  NCM contacted [facility] awaiting call back.  1134- Per Kandee Keen with [facility] they can take patient with HHRN and HHPT.  They will assess her for other needs as they go .  NCM notified DIL that she can transport patient home.      Barriers to Discharge: Continued Medical Work up   Patient Goals and CMS Choice Patient states their goals for this hospitalization and ongoing recovery are:: return home   Choice offered to / list presented to : NA      Discharge Placement                       Discharge Plan and Services Additional resources added to the After Visit Summary for   In-house Referral: NA Discharge Planning Services: CM Consult            DME Arranged: N/A DME Agency: NA         HH Agency: NA        Social Drivers of Health (SDOH) Interventions SDOH Screenings   Food Insecurity: No Food Insecurity (05/09/2023)  Housing: Low Risk  (05/09/2023)  Transportation Needs: No Transportation Needs (05/09/2023)  Utilities: Not At Risk (05/09/2023)  Financial Resource Strain: Low Risk  (04/26/2023)   Received from [facility]  Physical Activity: Unknown (09/06/2022)   Received from [facility]  Social Connections: Moderately Integrated (05/09/2023)  Stress: No Stress Concern Present (01/02/2023)   Received from [facility]  Tobacco Use: Low Risk  (05/08/2023)     Readmission Risk Interventions    05/10/2023    3:32 PM  Readmission Risk Prevention Plan  Transportation Screening Complete  PCP or Specialist Appt within 5-7 Days  Complete  Home Care Screening Complete  Medication Review (RN CM) Complete

## 2023-05-13 NOTE — Plan of Care (Signed)

## 2023-05-13 NOTE — Discharge Summary (Addendum)
 Physician Discharge Summary  ETTEL ALBERGO ZOX:[id number] DOB: 12/04/1923 DOA: 05/08/2023  PCP: Deloris Ping, MD  Admit date: 05/08/2023 Discharge date: 05/13/2023  Time spent: 45 minutes  Recommendations for Outpatient Follow-up:  PCP Dr. Turner Daniels on 3/17 [facility] heart care in 1 month Outpatient palliative care Home health services   Discharge Diagnoses:    Acute on chronic combined systolic and diastolic CHF (congestive heart failure) (HCC)   Non-ST elevation (NSTEMI) myocardial infarction [facility])   Atrial fibrillation with rapid ventricular response (HCC)   Stress-induced cardiomyopathy   Protein-calorie malnutrition, severe Hypothyroidism Hypokalemia Goals of care discussion  Discharge Condition: Improved  Diet recommendation: Low-sodium, heart healthy  Filed Weights   05/11/23 0500 05/12/23 0500 05/13/23 0342  Weight: 63 kg 65.2 kg 63 kg    History of present illness:  89 year old female with history of hypertension, hypothyroidism, GERD, hyperlipidemia, rheumatoid arthritis, CKD stage IIIb, chronic systolic heart failure, recent non-STEMI in 01/2023 during which patient declined cardiac catheterization and opted for medical management only presented with shortness of breath and chest discomfort. On presentation, WBC was 13.2, high-sensitivity troponins were 66 and then subsequently 2968. COVID/influenza/RSV PCR negative. Chest x-ray for possible pulmonary edema along with atelectasis or pneumonia. She was started on IV diuretics along with heparin drip. Cardiology was consulted.   Hospital Course:   Acute on chronic systolic heart failure Hypertension -Presented with chest discomfort and shortness of breath and was found to have elevated troponins as above and chest x-ray as above. -2D echo showed EF of 20 to 25%.  -Not volume overloaded at this time -Continue carvedilol, losartan, -avoid SGLT2i, she is on Lasix 20 Mg daily at baseline, this was  resumed -Follow-up with [facility] heart care and referral sent to outpatient palliative care  Goals of care -Now DNR.  Palliative care meeting completed, she wishes to continue conservative management -Anticipate need for hospice in the near future -Referral sent for outpatient palliative care     NSTEMI Possible Takotsubo cardiomyopathy -Troponin peaked at 2968 -Cards following, echo noted EF of 20-25% with global hypokinesis?  Takotsubo -Conservative management pursued with advanced age and frailty, patient also declined invasive workup -Continue carvedilol, losartan, Imdur -History of intolerance to antiplatelet agents?  Plavix, report of significant bruising and bleeding in the past, continue aspirin 81 Mg   Paroxysmal A-fib with RVR -Heart rates going up to the 140s on 05/10/2023 morning with hypotension.  Patient was started on IV amiodarone by cardiology and subsequently patient converted to sinus rhythm.  Amiodarone has been switched to oral  -Continue aspirin, history of bleeding complications in the past, plan to defer anticoagulation at this time, continue baby aspirin   Hypothyroidism--continue levothyroxine   Leukocytosis -Resolved   Hyponatremia -Improved   Hypokalemia -Replaced   Physical deconditioning -PT eval completed, home health services recommended and set up at discharge    Discharge Exam: Vitals:   05/13/23 0334 05/13/23 0717  BP: 118/79 125/61  Pulse: 67 65  Resp: 16 19  Temp: (!) 97.4 F (36.3 C) 97.6 F (36.4 C)  SpO2: 99% 100%   Gen: Awake, Alert, Oriented X 3, frail thinly built elderly female HEENT: no JVD Lungs: Good air movement bilaterally, CTAB CVS: S1S2/RRR Abd: soft, Non tender, non distended, BS present Extremities: No edema Skin: no new rashes on exposed skin       Discharge Instructions   Discharge Instructions     Amb Referral to Palliative Care   Complete by: As directed  Diet - low sodium heart healthy   Complete  by: As directed    Increase activity slowly   Complete by: As directed       Allergies as of 05/13/2023       Reactions   Codeine Nausea Only   Makes her crazy Hallucinations    Latex Itching   Penicillins Rash   YEAST INFECTION  YEAST INFECTION   Sulfa Antibiotics Anaphylaxis, Other (See Comments)   Heart stops   Thiopental Anaphylaxis, Rash   Other reaction(s): Unknown  CARDIAC ARREST  CARDIAC ARREST   Statins Itching   Itchy scalp with hair loss, confusion        Medication List     STOP taking these medications    cephALEXin 500 MG capsule Commonly known as: KEFLEX       TAKE these medications    amiodarone 200 MG tablet Commonly known as: PACERONE Take 1 tablet (200 mg total) by mouth 2 (two) times daily.   aspirin EC 81 MG tablet Take 81 mg by mouth daily. Swallow whole.   Biotin 10 MG Caps Take 10 mg by mouth daily.   CALCIUM 600/VITAMIN D PO Take 1 tablet by mouth daily.   carvedilol 3.125 MG tablet Commonly known as: COREG Take 1 tablet (3.125 mg total) by mouth 2 (two) times daily with a meal. What changed:  medication strength how much to take   CLEAR EYES FOR DRY EYES OP Place 2 drops into both eyes daily as needed (Dry eyes).   clotrimazole-betamethasone cream Commonly known as: LOTRISONE Apply 1 Application topically 2 (two) times daily as needed (irritation).   feeding supplement Liqd Take 237 mLs by mouth 2 (two) times daily between meals. What changed: additional instructions   Fish Oil 1000 MG Caps Take 1,000 mg by mouth daily.   furosemide 20 MG tablet Commonly known as: LASIX Take 20 mg by mouth daily.   isosorbide mononitrate 30 MG 24 hr tablet Commonly known as: IMDUR Take 30 mg by mouth daily.   levothyroxine 50 MCG tablet Commonly known as: SYNTHROID Take 50 mcg by mouth daily.   losartan 25 MG tablet Commonly known as: COZAAR Take 0.5 tablets (12.5 mg total) by mouth daily. What changed: when to take  this   melatonin 3 MG Tabs tablet Take 6 mg by mouth at bedtime.   omeprazole 20 MG capsule Commonly known as: PRILOSEC Take 20 mg by mouth daily.   rosuvastatin 10 MG tablet Commonly known as: CRESTOR Take 1 tablet (10 mg total) by mouth daily.   Thera-M Tabs Take by mouth.       Allergies  Allergen Reactions   Codeine Nausea Only    Makes her crazy Hallucinations    Latex Itching   Penicillins Rash    YEAST INFECTION  YEAST INFECTION   Sulfa Antibiotics Anaphylaxis and Other (See Comments)    Heart stops   Thiopental Anaphylaxis and Rash    Other reaction(s): Unknown  CARDIAC ARREST  CARDIAC ARREST   Statins Itching    Itchy scalp with hair loss, confusion    Follow-up Information     [facility] Follow up.   Specialty: Home Health Services Why: Agency will call you to set up apt times Contact information: [address] [place] [address] [phone]         Deloris Ping, MD. Go on 05/17/2023.   Specialty: Family Medicine Why: @11 :Engineer, maintenance (IT) information: [address]  [place] [address] [phone]                  The results of significant diagnostics from this hospitalization (including imaging, microbiology, ancillary and laboratory) are listed below for reference.    Significant Diagnostic Studies: ECHOCARDIOGRAM COMPLETE Result Date: 05/10/2023    ECHOCARDIOGRAM REPORT   Patient Name:   Vicki [name] Date of Exam: 05/10/2023 Medical Rec #:  [id number]       Height:       62.0 in Accession #:    [id number]      Weight:       145.3 lb Date of Birth:  10/08/1923      BSA:          1.669 m Patient Age:    89 years        BP:           131/66 mmHg Patient Gender: F               HR:           73 bpm. Exam Location:  Inpatient Procedure: 2D Echo, Color Doppler and Cardiac Doppler (Both Spectral and Color            Flow Doppler were utilized during procedure). Indications:     NSTEMI  History:        Patient has prior history of Echocardiogram examinations, most                 recent 02/08/2023. CHF; Risk Factors:Hypertension.  Sonographer:    Vern Claude Referring Phys: [id number] SARA-MAIZ A THOMAS IMPRESSIONS  1. Left ventricular ejection fraction, by estimation, is 20 to 25%. The left ventricle has severely decreased function. The left ventricle demonstrates global hypokinesis. The left ventricular internal cavity size was moderately dilated. Left ventricular diastolic parameters are indeterminate.  2. Right ventricular systolic function is moderately reduced. The right ventricular size is normal. There is mildly elevated pulmonary artery systolic pressure. The estimated right ventricular systolic pressure is 41.5 mmHg.  3. Left atrial size was severely dilated.  4. The mitral valve is degenerative. Severe mitral valve regurgitation. No evidence of mitral stenosis. Moderate mitral annular calcification.  5. The aortic valve is tricuspid. There is moderate calcification of the aortic valve. Aortic valve regurgitation is not visualized. Mild aortic valve stenosis. Aortic valve area, by VTI measures 2.82 cm. Aortic valve mean gradient measures 5.0 mmHg. Aortic valve Vmax measures 1.50 m/s.  6. The inferior vena cava is dilated in size with >50% respiratory variability, suggesting right atrial pressure of 8 mmHg. FINDINGS  Left Ventricle: Left ventricular ejection fraction, by estimation, is 20 to 25%. The left ventricle has severely decreased function. The left ventricle demonstrates global hypokinesis. The left ventricular internal cavity size was moderately dilated. There is no left ventricular hypertrophy. Left ventricular diastolic parameters are indeterminate. Right Ventricle: The right ventricular size is normal. No increase in right ventricular wall thickness. Right ventricular systolic function is moderately reduced. There is mildly elevated pulmonary artery systolic pressure. The  tricuspid regurgitant velocity is 3.02 m/s, and with an assumed right atrial pressure of 5 mmHg, the estimated right ventricular systolic pressure is 41.5 mmHg. Left Atrium: Left atrial size was severely dilated. Right Atrium: Right atrial size was normal in size. Pericardium: There is no evidence of pericardial effusion. Mitral Valve: The mitral valve is degenerative in appearance. There is moderate thickening of the mitral valve leaflet(s). Moderate mitral annular calcification. Severe  mitral valve regurgitation, with centrally-directed jet. No evidence of mitral valve stenosis. MV peak gradient, 6.0 mmHg. The mean mitral valve gradient is 2.0 mmHg. Tricuspid Valve: The tricuspid valve is normal in structure. Tricuspid valve regurgitation is mild . No evidence of tricuspid stenosis. Aortic Valve: The aortic valve is tricuspid. There is moderate calcification of the aortic valve. Aortic valve regurgitation is not visualized. Mild aortic stenosis is present. Aortic valve mean gradient measures 5.0 mmHg. Aortic valve peak gradient measures 9.0 mmHg. Aortic valve area, by VTI measures 2.82 cm. Pulmonic Valve: The pulmonic valve was normal in structure. Pulmonic valve regurgitation is not visualized. No evidence of pulmonic stenosis. Aorta: The aortic root is normal in size and structure. Venous: The inferior vena cava is dilated in size with greater than 50% respiratory variability, suggesting right atrial pressure of 8 mmHg. IAS/Shunts: No atrial level shunt detected by color flow Doppler.  LEFT VENTRICLE PLAX 2D LVIDd:         5.30 cm      Diastology LVIDs:         4.00 cm      LV e' medial:    4.68 cm/s LV PW:         0.90 cm      LV E/e' medial:  24.1 LV IVS:        0.70 cm      LV e' lateral:   10.30 cm/s LVOT diam:     2.00 cm      LV E/e' lateral: 11.0 LV SV:         75 LV SV Index:   45 LVOT Area:     3.14 cm  LV Volumes (MOD) LV vol d, MOD A2C: 162.0 ml LV vol d, MOD A4C: 183.0 ml LV vol s, MOD A2C: 89.4 ml  LV vol s, MOD A4C: 94.3 ml LV SV MOD A2C:     72.6 ml LV SV MOD A4C:     183.0 ml LV SV MOD BP:      81.3 ml RIGHT VENTRICLE             IVC RV Basal diam:  2.40 cm     IVC diam: 1.90 cm RV Mid diam:    2.00 cm RV S prime:     10.10 cm/s TAPSE (M-mode): 1.7 cm LEFT ATRIUM             Index        RIGHT ATRIUM           Index LA diam:        4.80 cm 2.88 cm/m   RA Area:     13.80 cm LA Vol (A2C):   65.2 ml 39.07 ml/m  RA Volume:   29.90 ml  17.92 ml/m LA Vol (A4C):   79.3 ml 47.52 ml/m LA Biplane Vol: 76.2 ml 45.66 ml/m  AORTIC VALVE                     PULMONIC VALVE AV Area (Vmax):    2.22 cm      PV Vmax:       1.02 m/s AV Area (Vmean):   1.94 cm      PV Peak grad:  4.2 mmHg AV Area (VTI):     2.82 cm AV Vmax:           150.25 cm/s AV Vmean:          106.700 cm/s AV VTI:  0.265 m AV Peak Grad:      9.0 mmHg AV Mean Grad:      5.0 mmHg LVOT Vmax:         106.00 cm/s LVOT Vmean:        65.800 cm/s LVOT VTI:          0.238 m LVOT/AV VTI ratio: 0.90  AORTA Ao Root diam: 2.90 cm Ao Asc diam:  2.60 cm MITRAL VALVE                 TRICUSPID VALVE MV Area (PHT): 3.81 cm      TR Peak grad:   36.5 mmHg MV Area VTI:   2.95 cm      TR Vmax:        302.00 cm/s MV Peak grad:  6.0 mmHg MV Mean grad:  2.0 mmHg      SHUNTS MV Vmax:       1.22 m/s      Systemic VTI:  0.24 m MV Vmean:      65.4 cm/s     Systemic Diam: 2.00 cm MV Decel Time: 199 msec MR Peak grad:   78.9 mmHg MR Mean grad:   51.3 mmHg MR Vmax:        444.00 cm/s MR Vmean:       341.0 cm/s MR PISA:        0.57 cm MR PISA Radius: 0.30 cm MV E velocity: 113.00 cm/s MV A velocity: 87.80 cm/s MV E/A ratio:  1.29 Arvilla Meres MD Electronically signed by Arvilla Meres MD Signature Date/Time: 05/10/2023/11:08:06 AM    Final    DG Chest Port 1 View Result Date: 05/08/2023 CLINICAL DATA:  Chest pain EXAM: PORTABLE CHEST 1 VIEW COMPARISON:  Chest x-ray 02/06/2023 FINDINGS: The heart is enlarged. There are increased central interstitial markings  bilaterally. There are patchy infrahilar opacities bilaterally. No pleural effusion or pneumothorax visualized. No acute fractures. IMPRESSION: 1. Cardiomegaly with increased central interstitial markings bilaterally, likely pulmonary edema. 2. Patchy infrahilar opacities bilaterally may represent atelectasis or pneumonia. Electronically Signed   By: Darliss Cheney M.D.   On: 05/08/2023 22:21    Microbiology: Recent Results (from the past 240 hours)  Resp panel by RT-PCR (RSV, Flu A&B, Covid) Anterior Nasal Swab     Status: None   Collection Time: 05/08/23  8:10 PM   Specimen: Anterior Nasal Swab  Result Value Ref Range Status   SARS Coronavirus 2 by RT PCR NEGATIVE NEGATIVE Final   Influenza A by PCR NEGATIVE NEGATIVE Final   Influenza B by PCR NEGATIVE NEGATIVE Final    Comment: (NOTE) The Xpert Xpress SARS-CoV-2/FLU/RSV plus assay is intended as an aid in the diagnosis of influenza from Nasopharyngeal swab specimens and should not be used as a sole basis for treatment. Nasal washings and aspirates are unacceptable for Xpert Xpress SARS-CoV-2/FLU/RSV testing.  Fact Sheet for Patients: BloggerCourse.com  Fact Sheet for Healthcare Providers: SeriousBroker.it  This test is not yet approved or cleared by the [place] FDA and has been authorized for detection and/or diagnosis of SARS-CoV-2 by FDA under an Emergency Use Authorization (EUA). This EUA will remain in effect (meaning this test can be used) for the duration of the COVID-19 declaration under Section 564(b)(1) of the Act, 21 U.S.C. section 360bbb-3(b)(1), unless the authorization is terminated or revoked.     Resp Syncytial Virus by PCR NEGATIVE NEGATIVE Final    Comment: (NOTE) Fact Sheet for Patients: BloggerCourse.com  Fact Sheet for  Healthcare Providers: SeriousBroker.it  This test is not yet approved or cleared by  the [place] and has been authorized for detection and/or diagnosis of SARS-CoV-2 by FDA under an Emergency Use Authorization (EUA). This EUA will remain in effect (meaning this test can be used) for the duration of the COVID-19 declaration under Section 564(b)(1) of the Act, 21 U.S.C. section 360bbb-3(b)(1), unless the authorization is terminated or revoked.  Performed at [facility], [address], [place], [place] [address]      Labs: Basic Metabolic Panel: Recent Labs  Lab 05/09/23 0513 05/10/23 0253 05/11/23 0259 05/12/23 0302 05/13/23 0245  NA 134* 136 137 136 137  K 3.6 3.5 3.4* 3.8 3.8  CL 104 105 105 107 109  CO2 21* 23 19* 21* 20*  GLUCOSE 146* 118* 104* 97 93  BUN 32* 51* 35* 24* 18  CREATININE 0.90 0.95 0.96 0.84 0.86  CALCIUM 9.1 8.5* 8.7* 8.7* 8.7*  MG  --  2.2 2.2 2.2 2.2   Liver Function Tests: Recent Labs  Lab 05/09/23 0513  AST 56*  ALT 39  ALKPHOS 42  BILITOT 0.4  PROT 6.4*  ALBUMIN 4.0   No results for input(s): "LIPASE", "AMYLASE" in the last 168 hours. No results for input(s): "AMMONIA" in the last 168 hours. CBC: Recent Labs  Lab 05/09/23 0513 05/10/23 0253 05/11/23 0259 05/12/23 0302 05/13/23 0245  WBC 12.0* 11.5* 8.3 9.6 10.5  NEUTROABS  --  7.0  --   --   --   HGB 13.7 11.3* 10.4* 10.6* 10.3*  HCT 41.1 33.6* 31.7* 32.3* 31.2*  MCV 92.6 92.6 93.8 95.3 94.5  PLT 256 222 212 207 214   Cardiac Enzymes: No results for input(s): "CKTOTAL", "CKMB", "CKMBINDEX", "TROPONINI" in the last 168 hours. BNP: BNP (last 3 results) Recent Labs    02/06/23 1637 05/08/23 2006  BNP 1,546.6* 1,228.6*    ProBNP (last 3 results) No results for input(s): "PROBNP" in the last 8760 hours.  CBG: Recent Labs  Lab 05/12/23 0611 05/12/23 1122 05/12/23 1556 05/12/23 2109 05/13/23 0559  GLUCAP 95 107* 107* 97 96       Signed:  Zannie Cove MD.  [facility] 05/13/2023, 10:59 AM

## 2023-05-13 NOTE — Plan of Care (Signed)

## 2023-05-13 NOTE — Progress Notes (Signed)
 Physical Therapy Treatment Patient Details Name: Vicki [name] MRN: [id number] DOB: 28-Dec-1923 Today's Date: 05/13/2023   History of Present Illness Vicki [name] is a 89 y.o. F admitted 05/08/23 with NSTEMI and acute on chronic CHF. Pt presented with SOB, chest pain, and elevated troponin and BNP. CXR with mild pulmonary edema. PMH significant for HTN, Hypothyroidism, GERD, HLD, rheumatoid arthritis, CKD 3b, HFrEF, recent NSTEMI in 01/2023 at which time cardiology recommended cardiac cath; however, pt declined and opted only for medical management.   PT Comments  Pt greeted supine in bed, pleasant and agreeable to PT session. She increased her gait distance, ambulating a total of ~266ft using rollator with modI and one prolonged seated rest break. Pt engaged in stair training, ascending/descending 4 steps with CGA and BUE on handrails. She is making great progress towards her PT goals. Will continue to benefit from acute PT services to increase activity tolerance and maximize functional mobility and safety prior to d/c Home with HHPT.   If plan is discharge home, recommend the following: Assistance with cooking/housework;Assist for transportation;Help with stairs or ramp for entrance   Can travel by private vehicle        Equipment Recommendations  None recommended by PT    Recommendations for Other Services       Precautions / Restrictions Precautions Precautions: Fall Recall of Precautions/Restrictions: Intact Restrictions Weight Bearing Restrictions Per Provider Order: No     Mobility  Bed Mobility Overal bed mobility: Needs Assistance Bed Mobility: Supine to Sit, Sit to Supine     Supine to sit: HOB elevated, Used rails, Supervision Sit to supine: Supervision   General bed mobility comments: Pt sat up on L side of bed with HOB elevated ~20deg and use of bedrails. She returned to bed and attempted to reposition herself towards [facility] by pulling on bedrails and pushing with  BLE, unable to change position; 2+ assist provided given pt's fatigue.    Transfers Overall transfer level: Modified independent Equipment used: Rollator (4 wheels) Transfers: Sit to/from Stand             General transfer comment: Pt demonstrated proper sequencing and safety awareness using rollator. Good eccentric control.    Ambulation/Gait Ambulation/Gait assistance: Modified independent (Device/Increase time) Gait Distance (Feet): 150 Feet (1x100, prolonged seated rest, 1x150) Assistive device: Rollator (4 wheels) Gait Pattern/deviations: Step-through pattern Gait velocity: reduced Gait velocity interpretation: <1.8 ft/sec, indicate of risk for recurrent falls   General Gait Details: Pt ambulated with a standard size rollator displaying a reciprocal gait pattern, even weight shift, and good foot clerance. She easily navigated at her baseline gait speed. She chose to rest on a chair in the hallway instead of using rollator seat as she reported difficulty with standing from rollator seat, stating she thinks her seat at home is higher.   Stairs Stairs: Yes Stairs assistance: Contact guard assist Stair Management: Two rails, Forwards, Step to pattern Number of Stairs: 4 General stair comments: Ascending pt led with RLE and descending pt led with LLE. CGA for safety as pt reported railings were further apart than her garage steps.   Wheelchair Mobility     Tilt Bed    Modified Rankin (Stroke Patients Only)       Balance Overall balance assessment: Needs assistance Sitting-balance support: No upper extremity supported, Feet supported Sitting balance-Leahy Scale: Good Sitting balance - Comments: Pt sat EOB and scooted fwd/bkwd with supervision.   Standing balance support: Bilateral upper extremity supported, Reliant on  assistive device for balance Standing balance-Leahy Scale: Poor Standing balance comment: Pt dependent on rollator for BUE support.                             Communication Communication Communication: Impaired Factors Affecting Communication: Hearing impaired  Cognition Arousal: Alert Behavior During Therapy: WFL for tasks assessed/performed   PT - Cognitive impairments: No apparent impairments                         Following commands: Intact      Cueing Cueing Techniques: Verbal cues, Gestural cues  Exercises      General Comments General comments (skin integrity, edema, etc.): VSS on RA      Pertinent Vitals/Pain Pain Assessment Pain Assessment: No/denies pain    Home Living                          Prior Function            PT Goals (current goals can now be found in the care plan section) Acute Rehab PT Goals Patient Stated Goal: Return Home Progress towards PT goals: Progressing toward goals    Frequency    Min 2X/week      PT Plan      Co-evaluation              AM-PAC PT "6 Clicks" Mobility   Outcome Measure  Help needed turning from your back to your side while in a flat bed without using bedrails?: A Little Help needed moving from lying on your back to sitting on the side of a flat bed without using bedrails?: A Little Help needed moving to and from a bed to a chair (including a wheelchair)?: None Help needed standing up from a chair using your arms (e.g., wheelchair or bedside chair)?: None Help needed to walk in hospital room?: None Help needed climbing 3-5 steps with a railing? : A Little 6 Click Score: 21    End of Session Equipment Utilized During Treatment: Gait belt Activity Tolerance: Patient tolerated treatment well;Patient limited by fatigue Patient left: in bed;with bed alarm set;with call bell/phone within reach Nurse Communication: Mobility status PT Visit Diagnosis: Muscle weakness (generalized) (M62.81);Difficulty in walking, not elsewhere classified (R26.2);Unsteadiness on feet (R26.81)     Time: 6578-4696 PT Time Calculation  (min) (ACUTE ONLY): 29 min  Charges:    $Gait Training: 23-37 mins PT General Charges $$ ACUTE PT VISIT: 1 Visit                     Cheri Guppy, PT, DPT Acute Rehabilitation Services Office: [phone] Secure Chat Preferred  Richardson Chiquito 05/13/2023, 12:55 PM

## 2023-05-14 ENCOUNTER — Telehealth: Payer: Self-pay | Admitting: Cardiology

## 2023-05-14 NOTE — Telephone Encounter (Signed)
Pt's son returning nurses call. Please advise 

## 2023-05-14 NOTE — Telephone Encounter (Signed)
 Spoke to patient's son.Stated mother was discharged from [facility] hospital yesterday.He was concerned her B/P low 121/47,121/49 pulse 70's.Stated she complains of dizziness.Stated he read Amiodarone side effects.Son reassured B/P was ok.Advised to monitor B/P and pulse daily keep a diary.Post hospital appointment scheduled with Dr.Harding 4/1 at 11:40 am.Advised to bring B/P readings and all medications to appointment.Advised to call sooner if diastolic B/P in low 40's and if dizziness worsens.I will make Dr.Harding aware.

## 2023-05-14 NOTE — Telephone Encounter (Signed)
 Pt c/o BP issue: STAT if pt c/o blurred vision, one-sided weakness or slurred speech.  STAT if BP is GREATER than 180/120 TODAY.  STAT if BP is LESS than 90/60 and SYMPTOMATIC TODAY  1. What is your BP concern? Low BP  2. Have you taken any BP medication today?yes  3. What are your last 5 BP readings?121/50, 120/47, 120/49  4. Are you having any other symptoms (ex. Dizziness, headache, blurred vision, passed out)? Dizziness  Pt was seen in the hospital by ours doctors

## 2023-05-14 NOTE — Telephone Encounter (Signed)
 Attempted to call patient, no answer left message requesting a call back.

## 2023-05-16 NOTE — Telephone Encounter (Signed)
 Those blood pressures are not all that low.  If she is on any other blood pressure medications we can stop them.

## 2023-05-17 NOTE — Telephone Encounter (Signed)
 Patient identification verified by 2 forms. [name], RN   Called and spoke to patients Son [name] states:   -Patients take: Amiodarone, losartan, carvedilol   -patient currently at PCP office   -Seems BP may have improved over the weekend  Advised [name]:   -to discuss medications and BP with PCP today  -follow up with cardiology with questions/concerns  -keep 4/1 OV with [name] for follow up  [name] verbalized understanding, no questions at this time

## 2023-05-31 ENCOUNTER — Encounter (HOSPITAL_BASED_OUTPATIENT_CLINIC_OR_DEPARTMENT_OTHER): Payer: Self-pay

## 2023-06-01 ENCOUNTER — Ambulatory Visit: Attending: Cardiology | Admitting: Cardiology

## 2023-06-01 VITALS — BP 130/58 | HR 67 | Ht 62.0 in | Wt 147.0 lb

## 2023-06-01 DIAGNOSIS — I5181 Takotsubo syndrome: Secondary | ICD-10-CM | POA: Diagnosis not present

## 2023-06-01 DIAGNOSIS — I5042 Chronic combined systolic (congestive) and diastolic (congestive) heart failure: Secondary | ICD-10-CM

## 2023-06-01 DIAGNOSIS — I10 Essential (primary) hypertension: Secondary | ICD-10-CM | POA: Diagnosis not present

## 2023-06-01 DIAGNOSIS — E785 Hyperlipidemia, unspecified: Secondary | ICD-10-CM

## 2023-06-01 DIAGNOSIS — E43 Unspecified severe protein-calorie malnutrition: Secondary | ICD-10-CM

## 2023-06-01 DIAGNOSIS — I4891 Unspecified atrial fibrillation: Secondary | ICD-10-CM

## 2023-06-01 DIAGNOSIS — I214 Non-ST elevation (NSTEMI) myocardial infarction: Secondary | ICD-10-CM | POA: Diagnosis not present

## 2023-06-01 DIAGNOSIS — R5381 Other malaise: Secondary | ICD-10-CM

## 2023-06-01 NOTE — Progress Notes (Signed)
 Cardiology Office Note:  .   Date:  06/06/2023  ID:  Vicki [name], DOB 11/06/1923, MRN [id number] PCP: Deloris Ping, MD  [facility] Providers Cardiologist:  Bryan Lemma, MD     Chief Complaint  Patient presents with   Hospitalization Follow-up    First cardiology visit following most recent hospitalization and first visit with  Heart Care   Cardiomyopathy    Recent admission with "non-STEMI "with drop in EF and A-fib.   Atrial Fibrillation    Remains on amiodarone and aspirin.  No breakthrough symptoms.    Patient Profile: .     Vicki [name] is a  89 y.o. female with a PMH notable for HFrEF (previous echo showed EF of 40 to 45% and most recently as low as 20 to 25%) with 3 sequential "NSTEMI "admissions in December 2024 then March 2025 with drop in EF, PAF in the setting of "MI ", HTN, HLD, CKD 3B, RA who presents here for follow-up at the request of Ryter-Brown, Fritzi Mandes, *.  Recently admitted for non-STEMI with acute on chronic combined heart failure and A-fib RVR.  Noted to have stress-induced cardiomyopathy protein malnutrition.  Troponin peaked at roughly 3000 and significant drop in EF was concerning for possible Takotsubo cardiomyopathy.  She was mourning the loss of her son. => Cardiac Catheterization Declined and Not Offered. EF 20 to 25% => discharged on carvedilol and losartan along with 20 mg Lasix.  On oral amiodarone to maintain sinus rhythm Plan was outpatient palliative care    Vicki [name] was last seen on rounds in the ER hospital on 05/11/2023.  Reconfirmed that there is no plans for invasive evaluation given her advanced age and her wishes.  Was able to be titrated to minimal doses of GDMT carvedilol 25 mg twice daily and losartan 12.5 mg daily along with Imdur 30 mg.  Because she was not able to tolerate aspirin and Plavix in the past recommended simply discontinue Plavix and continue aspirin alone.  She had had A-fib RVR and the  plan was to continue amiodarone in the outpatient setting to avoid recurrence of A-fib.  She also discharged on PRN Lasix. Part of the discussion on discharge was consideration of palliative care in the outpatient setting to help with home care including home PT.  Subjective  Discussed the use of AI scribe software for clinical note transcription with the patient, who gave verbal consent to proceed.  History of Present Illness History of Present Illness Vicki [name] is a 89 year old female with atrial fibrillation and heart failure who presents for follow-up after recent hospitalization. She is accompanied by her son and daughter-in-law, who are her primary caregivers.  She was discharged from the hospital on November 13th after being admitted on November 8th due to atrial fibrillation and heart failure. Since discharge, she has not experienced any episodes of palpitations and denies any chest pain, pressure, or tightness. She started amiodarone on November 13th and is taking 200 mg daily. She attributes her fatigue to this medication.  She is on a regimen of carvedilol 3.125 mg twice daily and losartan 12.5 mg daily, which have stabilized her blood pressure. Her caregivers monitor her weight daily and adjust her furosemide dose accordingly. She takes furosemide daily and doubles the dose if her weight increases by more than two to three pounds. She dislikes the frequent urination caused by furosemide, but understands the need to take it. Her caregivers note that  fluid tends to accumulate in her ankles and lungs, which they monitor closely.  She has not had any syncope or near syncope.  No TIA or amaurosis fugax.  She sleeps on one small pillow and prefers sleeping on her right side. No orthopnea or paroxysmal nocturnal dyspnea. No waking due to shortness of breath. She experiences disrupted sleep, describing it as 'off and on,' and frequently wakes to use the bathroom.  No bleeding issues on  aspirin alone.  She had previously declined the use of clopidogrel and/or DOAC.  Based on discussion in the hospital to the decision was made to stay with aspirin, acknowledging that there is a risk of stroke if she were to go back in A-fib.  Her caregivers report that she has not been contacted for physical therapy, which was supposed to be arranged by the hospital. She has caregivers in the evening and lives alone, but her son and daughter-in-law visit daily. They note that she tends to have heart issues between 4 to 6 PM, often after a bowel movement, which they believe triggers a vagal response, leading to nausea and dehydration. She manages this by drinking water throughout the night.  Apparently, she declined home palliative care/hospice   ROS:  Review of Systems - Negative except very deconditioned and weak, questions potential for continued home health/PT.    Objective   Other Pertinent Recent History: September 2024: Sustained a periprosthetic hip fracture treated with without surgical intervention 11/2-08/2022: "Non-STEMI-admit To [facility].  EF at that time showed 40 to 45% at that time she is on Xarelto that was then discontinued MADRS for Plavix.  Losartan converted to carvedilol.  Concerned about "blood thinners "due to significant bruising with falls..  Was walking with a walker and follow-up  Seen by [facility] cardiology 01/20/2023 for hospital follow-up: At that time declined amiodarone, but agree on statin. December 2024-Admitted to [facility].  Again she declined cardiac catheterization.  Symptoms of non-STEMI were more consistent with nausea and GERD type symptoms.   Seen by Dr. Chales Abrahams from [facility] cardiology on April 25, 2018 for: He noted that he routinely feels current type sensation in the afternoon, which she would treat by drinking ginger ale or taking antacids.  Bedside echocardiogram showed LVEF of 45 to 50%. => Added Ismo 10 mg.  Medications CV Meds:-  Amiodarone currently taking 200 mg daily; - Aspirin 81 mg daily; - Rosuvastatin 10 mg daily CHF meds:- Carvedilol 3.125 mg twice daily; - Losartan 12.5 mg daily; - Furosemide 20 mg daily with additional dose PRN weight gain or worsening edema Other:- Melatonin 6 mg nightly; Synthroid 50 mcg daily; Prilosec 2 mg daily   Studies Reviewed: Marland Kitchen   EKG Interpretation Date/Time:  Tuesday June 01 2023 12:27:06 EDT Ventricular Rate:  67 PR Interval:  134 QRS Duration:  158 QT Interval:  458 QTC Calculation: 483 R Axis:   -50  Text Interpretation: Normal sinus rhythm Left axis deviation Left bundle branch block When compared with ECG of 10-May-2023 08:38, Sinus rhythm has replaced Atrial fibrillation Vent. rate has decreased BY  70 BPM T wave inversion less evident in Lateral leads Confirmed by Bryan Lemma ([id number]) on 06/01/2023 12:31:13 PM    Echo 12/15/2019 [facility]): EF 55 to 60%.  Normal LV size and thickness.  GR 1 DD.  Mild LA dilation.  Mild MV thickening/calcification.  Mild AOV sclerosis with no stenosis.  ECHO 02/06/2023: LVEF 40 to 45% with mildly decreased function.  GR 2 DD/elevated  LVEDP with moderate LA dilation.  Mildly elevated RVSP with normal RV size and function-mildly elevated RAP with dilated IVC.  Moderate MAC with moderate MR.  Severe aortic valve calcification with mild AS. Echo 05/10/2023: LVEF 20 to 25%. With global hypokinesis and moderate LV dilation.  Unable to assess diastolic parameters.  PAP still mildly elevated at roughly 41.5 mmHg.Marland Kitchen  Severe MR (likely functional).  Mean AoV gradient now 5.0-also likely functional.  Lab Results  Component Value Date   NA 137 05/13/2023   K 3.8 05/13/2023   CREATININE 0.86 05/13/2023   GFRNONAA >60 05/13/2023   GLUCOSE 93 05/13/2023   No results found for: "CHOL", "HDL", "LDLCALC", "LDLDIRECT", "TRIG", "CHOLHDL" Lab Results  Component Value Date   HGBA1C 5.4 05/09/2023  Lipids 01/06/2023 from [facility]: TC 128, TG 107,  HDL 38, LDL 69.   Risk Assessment/Calculations:    CHA2DS2-VASc Score = 6   This indicates a 9.7% annual risk of stroke. The patient's score is based upon: CHF History: 1 HTN History: 1 Diabetes History: 0 Stroke History: 0 Vascular Disease History: 1 Age Score: 2 Gender Score: 1   She is currently on aspirin alone.  Considerable concerns of bleed and therefore is on amiodarone for rhythm control.       Physical Exam:   VS:  BP (!) 130/58 (BP Location: Left Arm, Patient Position: Sitting, Cuff Size: Normal)   Pulse 67   Ht 5\' 2"  (1.575 m)   Wt 147 lb (66.7 kg)   SpO2 99%   BMI 26.89 kg/m    Wt Readings from Last 3 Encounters:  06/01/23 147 lb (66.7 kg)  05/13/23 138 lb 14.2 oz (63 kg)  02/09/23 141 lb 12.1 oz (64.3 kg)    GEN: Well nourished, well groomed in no acute distress; see does seem somewhat frail but nontoxic. NECK: No JVD; No carotid bruits CARDIAC:  RRR, Normal S1, S2;soft 1/6 SEM at RUSB but otherwise no significant murmurs, rubs, gallops RESPIRATORY:  Clear to auscultation without rales, wheezing or rhonchi ; nonlabored, good air movement. ABDOMEN: Soft, non-tender, non-distended EXTREMITIES:  No edema; No deformity      ASSESSMENT AND PLAN: .    Problem List Items Addressed This Visit       Cardiology Problems   Atrial fibrillation with rapid ventricular response (HCC) (Chronic)   She was previously in atrial fibrillation but is now in normal sinus rhythm, likely due to amiodarone. The goal is to prevent recurrence. Amiodarone is used despite its risks in elderly patients due to its efficacy in maintaining sinus rhythm.  Tikosyn (dofetilide) is an alternative but requires careful electrolyte monitoring and is not preferred due to its complexity. - Continue amiodarone with a reduction to half a tablet after 30 days of the 200 mg dose. - Per discussion with patient and family indicating her wishes to avoid "blood thinners ", we decided to go to aspirin  alone understanding the risks of stroke are higher with aspirin versus on DOAC.      Chronic combined systolic and diastolic CHF (congestive heart failure) (HCC) (Chronic)   She experiences intermittent fluid retention, particularly in the ankles, managed with furosemide.  A sliding scale for furosemide is used based on weight changes to prevent fluid overload and associated symptoms. Fluid retention is monitored by daily weight and signs such as ankle swelling and shortness of breath. - Continue furosemide 20 mg daily with sliding scale dosing based on daily weight.   (Additional dose for  weight gain> 3 pounds in 1 day or >5 pounds in 1 week, also with worsening edema) - Continue with combination of carvedilol 3.25 mg twice daily and losartan at 12.5 mg daily.  With her having some balance and dizziness issues, I am reluctant to push him too far at this point.  However low threshold to titrate losartan to a full 25 mg. - Able to titrate ARB up to 25 mg daily, would then consider the possibility of spironolactone to avoid excess use of Lasix. Given her advanced age and concerns for the possibility of UTIs I will avoid SGLT2 inhibitors.      Essential hypertension - Primary (Chronic)   Blood pressure is well-managed with carvedilol 3.125 mg twice daily and losartan 12.5 mg daily Blood pressure fluctuations were noted in the hospital but have stabilized with current dosing. - Continue carvedilol and losartan => low threshold to titrate to full 25 mg losartan - Monitor blood pressure regularly.      Relevant Orders   EKG 12-Lead (Completed)   ECHOCARDIOGRAM COMPLETE   Hyperlipidemia with target low density lipoprotein (LDL) cholesterol less than 70 mg/dL (Chronic)   Most recent labs are from [facility] in November at that time her LDL was 69.  She remains on 10 mg rosuvastatin. -> At target for advanced age.  Continue low-dose rosuvastatin and monitor lipid levels and LFTs.      NSTEMI  (non-ST elevated myocardial infarction) (HCC) (Chronic)   3 subsequent admissions with elevated troponin.  The first 2 had a mild drop in EF to the 40-45 % range, but the most recent had a significant drop down to 20-25%.  No invasive or noninvasive ischemic evaluation done as titration had not had declined invasive evaluation.  Was not offered during his most recent episode given her advanced age and previous explanation about no wishes for invasive procedures.  Symptoms leading up to her recent hospitalizations of "non-STEMI's" were more GERD type symptoms.  Difficult to determine what her true angina is, because she is not the greatest of historians.  Treated medically in the hospital with IV heparin for short-term and discharged on aspirin, beta-blocker, statin and ARB.  No further anginal type symptoms.  - Continue current dose of losartan 12.5 Miller daily, carvedilol 3.125 mg twice daily plus Imdur 30 mg daily      Stress-induced cardiomyopathy   Most recent hospitalization with a drop in EF further from the 48 to 45% down to 20-25% with troponin level of only 3000 which would not necessarily correlate with that much of a drop in EF. Interestingly this recent cycle of events coincides with significant family losses most notably her son admitted close W member also passing away last fall suggesting the possibility of this most recent episode being related Takotsubo cardiomyopathy.  Plan will be to reassess echo in 2 to 3 months after close monitoring.        Other   Physical deconditioning   It appears that the referral for outpatient home health did not come through.  This may have been because the initial referral came through as home health palliative care.  Recommend that she discuss establishing this through her PCP and if not able to do so, I would be happy to put in an order for home health PT.      Protein-calorie malnutrition, severe   Doing better since hospitalization.   Encouraged healthy diet.        Follow-Up: Return in about 2 months (  around 08/01/2023) for  2 months with APP, 6 month with MD, Follow-up with APP, 6 month follow-up with me. Follow-up care is essential to monitor her cardiac status and adjust treatment as necessary. An echocardiogram will be performed to assess heart function recovery, and follow-up appointments are scheduled to ensure continuity of care. - Schedule follow-up echocardiogram in mid to late June. - Arrange follow-up appointment with PA in July. - Schedule follow-up appointment with cardiologist in six months.  Recording duration: 28 minutes Total time spent: 28 min spent with patient + 28 min spent charting = 56 min I spent 56 minutes in the care of Vicki [name] today including reviewing labs (From [place]-0.5 minute), reviewing outside labs from [facility] (0.5 minute), reviewing studies (from [facility]-2 Echocardiograms reviewed-images reviewed as well.  4 minutes.), reviewing outside studies (Echo from [facility]-2 minutes), face to face time discussing treatment options (28 minutes), reviewing records from 3 separate hospitalizations, 2 [facility] and 2 Primary Care Clinic Notes (7 minutes), 12 minutes dictating, and documenting in the encounter.     Signed, Marykay Lex, MD, MS Bryan Lemma, M.D., M.S. Interventional Cardiologist  [facility]  Pager # [phone] Phone # [phone] [address] [place], [place] [address]

## 2023-06-01 NOTE — Patient Instructions (Addendum)
 Medication Instructions:    No changes   *If you need a refill on your cardiac medications before your next appointment, please call your pharmacy*   Lab Work:  NOT  NEEDED     Testing/Procedures: WILL DO ECHO IN LATE JUNE 2025  AT  1220 [address]  Your physician has requested that you have an echocardiogram. Echocardiography is a painless test that uses sound waves to create images of your heart. It provides your doctor with information about the size and shape of your heart and how well your heart's chambers and valves are working. This procedure takes approximately one hour. There are no restrictions for this procedure. Please do NOT wear cologne, perfume, aftershave, or lotions (deodorant is allowed). Please arrive 15 minutes prior to your appointment time.  Please note: We ask at that you not bring children with you during ultrasound (echo/ vascular) testing. Due to room size and safety concerns, children are not allowed in the ultrasound rooms during exams. Our front office staff cannot provide observation of children in our lobby area while testing is being conducted. An adult accompanying a patient to their appointment will only be allowed in the ultrasound room at the discretion of the ultrasound technician under special circumstances. We apologize for any inconvenience.   Follow-Up: At [facility], you and your health needs are our priority.  As part of our continuing mission to provide you with exceptional heart care, we have created designated Provider Care Teams.  These Care Teams include your primary Cardiologist (physician) and Advanced Practice Providers (APPs -  Physician Assistants and Nurse Practitioners) who all work together to provide you with the care you need, when you need it.     Your next appointment:   2 month(s) AFTER ECHO   The format for your next appointment:   In Person  Provider:   Bernadene Person NP   and Bryan Lemma, MD  6 Month   Other  Instructions  Talk with your Primary about physical therapy     s

## 2023-06-06 ENCOUNTER — Encounter: Payer: Self-pay | Admitting: Cardiology

## 2023-06-06 DIAGNOSIS — R5381 Other malaise: Secondary | ICD-10-CM | POA: Insufficient documentation

## 2023-06-06 DIAGNOSIS — E785 Hyperlipidemia, unspecified: Secondary | ICD-10-CM | POA: Insufficient documentation

## 2023-06-06 NOTE — Assessment & Plan Note (Addendum)
 She experiences intermittent fluid retention, particularly in the ankles, managed with furosemide.  A sliding scale for furosemide is used based on weight changes to prevent fluid overload and associated symptoms. Fluid retention is monitored by daily weight and signs such as ankle swelling and shortness of breath. - Continue furosemide 20 mg daily with sliding scale dosing based on daily weight.   (Additional dose for weight gain> 3 pounds in 1 day or >5 pounds in 1 week, also with worsening edema) - Continue with combination of carvedilol 3.25 mg twice daily and losartan at 12.5 mg daily.  With her having some balance and dizziness issues, I am reluctant to push him too far at this point.  However low threshold to titrate losartan to a full 25 mg. - Able to titrate ARB up to 25 mg daily, would then consider the possibility of spironolactone to avoid excess use of Lasix. Given her advanced age and concerns for the possibility of UTIs I will avoid SGLT2 inhibitors.

## 2023-06-06 NOTE — Assessment & Plan Note (Signed)
 It appears that the referral for outpatient home health did not come through.  This may have been because the initial referral came through as home health palliative care.  Recommend that she discuss establishing this through her PCP and if not able to do so, I would be happy to put in an order for home health PT.

## 2023-06-06 NOTE — Assessment & Plan Note (Signed)
 Blood pressure is well-managed with carvedilol 3.125 mg twice daily and losartan 12.5 mg daily Blood pressure fluctuations were noted in the hospital but have stabilized with current dosing. - Continue carvedilol and losartan => low threshold to titrate to full 25 mg losartan - Monitor blood pressure regularly.

## 2023-06-06 NOTE — Assessment & Plan Note (Addendum)
 3 subsequent admissions with elevated troponin.  The first 2 had a mild drop in EF to the 40-45 % range, but the most recent had a significant drop down to 20-25%.  No invasive or noninvasive ischemic evaluation done as titration had not had declined invasive evaluation.  Was not offered during his most recent episode given her advanced age and previous explanation about no wishes for invasive procedures.  Symptoms leading up to her recent hospitalizations of "non-STEMI's" were more GERD type symptoms.  Difficult to determine what her true angina is, because she is not the greatest of historians.  Treated medically in the hospital with IV heparin for short-term and discharged on aspirin, beta-blocker, statin and ARB.  No further anginal type symptoms.  - Continue current dose of losartan 12.5 Miller daily, carvedilol 3.125 mg twice daily plus Imdur 30 mg daily

## 2023-06-06 NOTE — Assessment & Plan Note (Signed)
 Most recent hospitalization with a drop in EF further from the 48 to 45% down to 20-25% with troponin level of only 3000 which would not necessarily correlate with that much of a drop in EF. Interestingly this recent cycle of events coincides with significant family losses most notably her son admitted close W member also passing away last fall suggesting the possibility of this most recent episode being related Takotsubo cardiomyopathy.  Plan will be to reassess echo in 2 to 3 months after close monitoring.

## 2023-06-06 NOTE — Assessment & Plan Note (Signed)
 She was previously in atrial fibrillation but is now in normal sinus rhythm, likely due to amiodarone. The goal is to prevent recurrence. Amiodarone is used despite its risks in elderly patients due to its efficacy in maintaining sinus rhythm.  Tikosyn (dofetilide) is an alternative but requires careful electrolyte monitoring and is not preferred due to its complexity. - Continue amiodarone with a reduction to half a tablet after 30 days of the 200 mg dose. - Per discussion with patient and family indicating her wishes to avoid "blood thinners ", we decided to go to aspirin alone understanding the risks of stroke are higher with aspirin versus on DOAC.

## 2023-06-06 NOTE — Assessment & Plan Note (Signed)
 Most recent labs are from [facility] in November at that time her LDL was 69.  She remains on 10 mg rosuvastatin. -> At target for advanced age.  Continue low-dose rosuvastatin and monitor lipid levels and LFTs.

## 2023-06-06 NOTE — Assessment & Plan Note (Signed)
 Doing better since hospitalization.  Encouraged healthy diet.

## 2023-06-07 ENCOUNTER — Other Ambulatory Visit (HOSPITAL_COMMUNITY): Payer: Self-pay

## 2023-06-08 ENCOUNTER — Other Ambulatory Visit: Payer: Self-pay

## 2023-06-09 ENCOUNTER — Telehealth: Payer: Self-pay | Admitting: Cardiology

## 2023-06-09 ENCOUNTER — Encounter (HOSPITAL_COMMUNITY): Payer: Self-pay

## 2023-06-09 NOTE — Telephone Encounter (Signed)
 Pt c/o medication issue:  1. Name of Medication: amiodarone (PACERONE) 200 MG tablet   2. How are you currently taking this medication (dosage and times per day)?   3. Are you having a reaction (difficulty breathing--STAT)? No   4. What is your medication issue? [facility] called in stating [name] wanted to cut this med in half however rx hasn't been update. Please advise.   They want it sent to [facility]

## 2023-06-10 ENCOUNTER — Other Ambulatory Visit (HOSPITAL_COMMUNITY): Payer: Self-pay

## 2023-06-10 MED ORDER — AMIODARONE HCL 200 MG PO TABS
200.0000 mg | ORAL_TABLET | Freq: Every day | ORAL | 0 refills | Status: DC
  Filled 2023-06-10 – 2023-06-15 (×2): qty 30, 30d supply, fill #0

## 2023-06-10 NOTE — Telephone Encounter (Signed)
 Per [name] on last office visit on 06/09/23 pt was to continue medication amiodarone 200 mg daily after 30 days reduce medication to 1/2 tablet daily. Medication was sent to pt's pharmacy for a 30 day supply until pt is ready to start the 1/2 tablet daily. Confirmation received.

## 2023-06-15 ENCOUNTER — Other Ambulatory Visit (HOSPITAL_COMMUNITY): Payer: Self-pay

## 2023-06-17 ENCOUNTER — Telehealth: Payer: Self-pay | Admitting: Cardiology

## 2023-06-17 NOTE — Telephone Encounter (Signed)
 Spoke to patient's daughter n Social worker.She stated patient is having side effects from Amiodarone.She feels disoriented and she is having a strange feeling across upper back. She is taking 200 mg daily for 30 days and then she is suppose to take 100 mg daily this Sunday.She wants to know if ok to start 100 mg daily today.Advised I will send message to Dr.Harding and our Pharmacy team.

## 2023-06-17 NOTE — Telephone Encounter (Signed)
 Spoke to patient's daughter PharmD advised ok to start Amiodarone 100 mg today.Advised to call back if she continues to have symptoms.

## 2023-06-17 NOTE — Telephone Encounter (Signed)
 Returned call to patient's daughter left message on voice mail to call back.

## 2023-06-17 NOTE — Telephone Encounter (Signed)
 Pt c/o medication issue:  1. Name of Medication:  amiodarone (PACERONE) 200 MG tablet  2. How are you currently taking this medication (dosage and times per day)?   3. Are you having a reaction (difficulty breathing--STAT)?   4. What is your medication issue?   Daughter in law would like to know if patient can start on 1/2 tablet tomorrow. Please advise.

## 2023-07-06 ENCOUNTER — Other Ambulatory Visit (HOSPITAL_COMMUNITY): Payer: Self-pay

## 2023-07-07 ENCOUNTER — Other Ambulatory Visit (HOSPITAL_COMMUNITY): Payer: Self-pay

## 2023-07-08 ENCOUNTER — Other Ambulatory Visit (HOSPITAL_COMMUNITY): Payer: Self-pay

## 2023-07-08 ENCOUNTER — Other Ambulatory Visit: Payer: Self-pay

## 2023-07-08 MED ORDER — CARVEDILOL 3.125 MG PO TABS
3.1250 mg | ORAL_TABLET | Freq: Two times a day (BID) | ORAL | 3 refills | Status: DC
  Filled 2023-07-08: qty 60, 30d supply, fill #0
  Filled 2023-08-08: qty 60, 30d supply, fill #1

## 2023-07-10 ENCOUNTER — Other Ambulatory Visit: Payer: Self-pay | Admitting: Cardiology

## 2023-07-12 ENCOUNTER — Other Ambulatory Visit (HOSPITAL_COMMUNITY): Payer: Self-pay

## 2023-07-12 ENCOUNTER — Emergency Department (HOSPITAL_COMMUNITY)
Admission: EM | Admit: 2023-07-12 | Discharge: 2023-07-12 | Disposition: A | Discharge status: Home / Self Care | Attending: Emergency Medicine | Admitting: Emergency Medicine

## 2023-07-12 ENCOUNTER — Other Ambulatory Visit: Payer: Self-pay

## 2023-07-12 ENCOUNTER — Emergency Department (HOSPITAL_COMMUNITY)

## 2023-07-12 DIAGNOSIS — S81812A Laceration without foreign body, left lower leg, initial encounter: Secondary | ICD-10-CM | POA: Insufficient documentation

## 2023-07-12 DIAGNOSIS — Z23 Encounter for immunization: Secondary | ICD-10-CM | POA: Diagnosis not present

## 2023-07-12 DIAGNOSIS — Z7982 Long term (current) use of aspirin: Secondary | ICD-10-CM | POA: Diagnosis not present

## 2023-07-12 DIAGNOSIS — Z91040 Latex allergy status: Secondary | ICD-10-CM | POA: Diagnosis not present

## 2023-07-12 DIAGNOSIS — S8992XA Unspecified injury of left lower leg, initial encounter: Secondary | ICD-10-CM | POA: Diagnosis present

## 2023-07-12 DIAGNOSIS — W228XXA Striking against or struck by other objects, initial encounter: Secondary | ICD-10-CM | POA: Diagnosis not present

## 2023-07-12 MED ORDER — AMIODARONE HCL 200 MG PO TABS
200.0000 mg | ORAL_TABLET | Freq: Every day | ORAL | 3 refills | Status: DC
  Filled 2023-07-12: qty 30, 30d supply, fill #0

## 2023-07-12 MED ORDER — TETANUS-DIPHTH-ACELL PERTUSSIS 5-2.5-18.5 LF-MCG/0.5 IM SUSY
0.5000 mL | PREFILLED_SYRINGE | Freq: Once | INTRAMUSCULAR | Status: AC
  Administered 2023-07-12: 0.5 mL via INTRAMUSCULAR
  Filled 2023-07-12: qty 0.5

## 2023-07-12 NOTE — ED Triage Notes (Signed)
 Pt BIB [facility] d/t left LE.  She hit her LLE on the bed and now has a skin tear.  Pt is not on thinners.

## 2023-07-12 NOTE — Discharge Instructions (Signed)
 It was a pleasure taking part in your care.  As discussed, please follow-up with your PCP for continued monitoring of your skin tear.  Please utilize supplies provided to you to care for wound at home.  Return to the ED with any new or worsening symptoms.  Reattach guide concerning on suture laceration care.

## 2023-07-12 NOTE — ED Provider Notes (Signed)
 [facility] Provider Note   CSN: [id number] Arrival date & time: 07/12/23  0207     History  Chief Complaint  Patient presents with   Leg Injury    Vicki [name] is a 89 y.o. female who presents to the ED for evaluation of injury to her left lower leg.  States that she was attempting to use the bathroom this evening when she struck her left lower leg on her bedside toilet.  She is here with a skin tear to her left anterior shin.  She denies being on blood thinners.  She denies falling or hitting her head during this event.  HPI     Home Medications Prior to Admission medications   Medication Sig Start Date End Date Taking? Authorizing Provider  amiodarone  (PACERONE ) 200 MG tablet Take 1 tablet (200 mg total) by mouth daily. Continue amiodarone  200 mg daily for 30 days then reduce to half a tablet daily. 06/10/23   Arleen Lacer, MD  aspirin  EC 81 MG tablet Take 81 mg by mouth daily. Swallow whole.    [provider]  Biotin 10 MG CAPS Take 10 mg by mouth daily.    [provider]  Calcium  Carb-Cholecalciferol (CALCIUM  600/VITAMIN D PO) Take 1 tablet by mouth daily.    [provider]  Carboxymethylcellul-Glycerin (CLEAR EYES FOR DRY EYES OP) Place 2 drops into both eyes daily as needed (Dry eyes).    [provider]  carvedilol  (COREG ) 3.125 MG tablet Take 1 tablet (3.125 mg total) by mouth 2 (two) times daily with a meal. 07/08/23     clotrimazole-betamethasone (LOTRISONE) cream Apply 1 Application topically 2 (two) times daily as needed (irritation). 11/18/20 11/05/23  [provider]  feeding supplement (ENSURE ENLIVE / ENSURE PLUS) LIQD Take 237 mLs by mouth 2 (two) times daily between meals. Patient taking differently: Take 237 mLs by mouth 2 (two) times daily between meals. Prefers chocolate Ensure. No Vanilla 02/09/23   Verlyn Goad, MD  furosemide  (LASIX ) 20 MG tablet Take 20 mg by  mouth daily. 09/13/20   [provider]  isosorbide  mononitrate (IMDUR ) 30 MG 24 hr tablet Take 30 mg by mouth daily. Patient not taking: Reported on 06/01/2023 04/28/23   [provider]  levothyroxine  (SYNTHROID ) 50 MCG tablet Take 50 mcg by mouth daily. 09/05/13   [provider]  losartan  (COZAAR ) 25 MG tablet Take 0.5 tablets (12.5 mg total) by mouth daily. Patient taking differently: Take 12.5 mg by mouth at bedtime. 02/10/23 05/11/23  Verlyn Goad, MD  melatonin 3 MG TABS tablet Take 6 mg by mouth at bedtime.    [provider]  Multiple Vitamins-Minerals (THERA-M) TABS Take by mouth.    [provider]  Omega-3 Fatty Acids (FISH OIL) 1000 MG CAPS Take 1,000 mg by mouth daily.    [provider]  omeprazole (PRILOSEC) 20 MG capsule Take 20 mg by mouth daily.    [provider]  rosuvastatin  (CRESTOR ) 10 MG tablet Take 1 tablet (10 mg total) by mouth daily. Patient not taking: Reported on 05/09/2023 02/10/23 05/11/23  Verlyn Goad, MD      Allergies    Codeine, Latex, Penicillins, Sulfa antibiotics, Thiopental, and Statins    Review of Systems   Review of Systems  Skin:  Positive for wound.    Physical Exam Updated Vital Signs BP 137/72 (BP Location: Left Arm)   Pulse 82   Temp  97.7 F (36.5 C) (Oral)   Resp 16   Ht 5\' 2"  (1.575 m)   Wt 66.7 kg   SpO2 100%   BMI 26.90 kg/m  Physical Exam Vitals and nursing note reviewed.  Constitutional:      General: She is not in acute distress.    Appearance: She is well-developed.  HENT:     Head: Normocephalic and atraumatic.  Eyes:     Conjunctiva/sclera: Conjunctivae normal.  Cardiovascular:     Rate and Rhythm: Normal rate and regular rhythm.     Heart sounds: No murmur heard. Pulmonary:     Effort: Pulmonary effort is normal. No respiratory distress.     Breath sounds: Normal breath sounds.  Abdominal:     Palpations: Abdomen is soft.     Tenderness: There is  no abdominal tenderness.  Musculoskeletal:        General: No swelling.     Cervical back: Neck supple.  Skin:    General: Skin is warm and dry.     Capillary Refill: Capillary refill takes less than 2 seconds.     Comments: 6 inch horizontal skin tear to left anterior shin.  Neurological:     Mental Status: She is alert.  Psychiatric:        Mood and Affect: Mood normal.       ED Results / Procedures / Treatments   Labs (all labs ordered are listed, but only abnormal results are displayed) Labs Reviewed - No data to display  EKG None  Radiology No results found.  Procedures .Laceration Repair  Date/Time: 07/12/2023 3:24 AM  Performed by: Adel Aden, PA-C Authorized by: Adel Aden, PA-C   Consent:    Consent obtained:  Verbal   Risks discussed:  Infection, need for additional repair, nerve damage, poor wound healing, poor cosmetic result, pain, retained foreign body, tendon damage and vascular damage   Alternatives discussed:  No treatment Universal protocol:    Patient identity confirmed:  Verbally with patient and arm band Anesthesia:    Anesthesia method:  None Laceration details:    Location:  Leg   Leg location:  L lower leg   Length (cm):  6 Pre-procedure details:    Preparation:  Imaging obtained to evaluate for foreign bodies Treatment:    Area cleansed with:  Saline   Amount of cleaning:  Standard   Irrigation solution:  Sterile saline   Irrigation method:  Syringe   Debridement:  None Skin repair:    Repair method:  Steri-Strips   Number of Steri-Strips:  4 Approximation:    Approximation:  Loose Repair type:    Repair type:  Simple Post-procedure details:    Dressing:  Antibiotic ointment and bulky dressing   Procedure completion:  Tolerated well, no immediate complications     Medications Ordered in ED Medications  Tdap (BOOSTRIX) injection 0.5 mL (0.5 mLs Intramuscular Given 07/12/23 0232)    ED Course/ Medical  Decision Making/ A&P  Medical Decision Making Amount and/or Complexity of Data Reviewed Radiology: ordered.  Risk Prescription drug management.   89 year old female presents for evaluation.  Please see HPI for further details.  On examination patient is afebrile and not tachycardic.  Lung sounds are clear bilaterally, nonhypoxic.  Abdomen soft and compressible.  Neurological examination is at baseline.  Patient has a 6 cm horizontal skin tear to her anterior left shin.  Will collect x-ray imaging to ensure no underlying fracture.  Will update tetanus.  X-ray  imaging negative.  No evidence of osseous abnormality.  Patient tetanus updated.  Patient wound was repaired with Steri-Strips.  She was provided with supplies to care for wound at home.  Advised that she will need to follow-up with her PCP for continued monitoring and she voiced understanding.  These instructions were also given to her children at the bedside.  She was given return precautions and she voiced understanding.  Stable to discharge home.   Final Clinical Impression(s) / ED Diagnoses Final diagnoses:  Noninfected skin tear of left lower extremity, initial encounter    Rx / DC Orders ED Discharge Orders     None         Adel Aden, PA-C 07/12/23 0326    Roberts Ching, MD 07/12/23 [id number]

## 2023-07-16 ENCOUNTER — Encounter: Payer: Self-pay | Admitting: Cardiology

## 2023-07-16 ENCOUNTER — Ambulatory Visit: Attending: Cardiology | Admitting: Cardiology

## 2023-07-16 VITALS — BP 130/62 | HR 70 | Ht 62.0 in | Wt 141.2 lb

## 2023-07-16 DIAGNOSIS — I5042 Chronic combined systolic (congestive) and diastolic (congestive) heart failure: Secondary | ICD-10-CM | POA: Diagnosis not present

## 2023-07-16 DIAGNOSIS — R5381 Other malaise: Secondary | ICD-10-CM

## 2023-07-16 DIAGNOSIS — E785 Hyperlipidemia, unspecified: Secondary | ICD-10-CM

## 2023-07-16 DIAGNOSIS — I10 Essential (primary) hypertension: Secondary | ICD-10-CM

## 2023-07-16 DIAGNOSIS — I5181 Takotsubo syndrome: Secondary | ICD-10-CM | POA: Diagnosis not present

## 2023-07-16 DIAGNOSIS — E43 Unspecified severe protein-calorie malnutrition: Secondary | ICD-10-CM

## 2023-07-16 DIAGNOSIS — I480 Paroxysmal atrial fibrillation: Secondary | ICD-10-CM | POA: Diagnosis not present

## 2023-07-16 MED ORDER — AMIODARONE HCL 200 MG PO TABS: 100.0000 mg | ORAL_TABLET | ORAL | 6 refills | Status: AC

## 2023-07-16 MED ORDER — LOSARTAN POTASSIUM 25 MG PO TABS: 25.0000 mg | ORAL_TABLET | Freq: Every day | ORAL | 3 refills | Status: AC

## 2023-07-16 NOTE — Patient Instructions (Signed)
 Medication Instructions:    Continue Amiodarone   100 mg until August 16, 2023 starting on June 17 start taking Amiodarone  100 mg every other day.   Increase Losartan  to 25 mg daily  If your blood pressure is 110 or less ( top Number ) or you feel dizzy - do not take Losartan   *If you need a refill on your cardiac medications before your next appointment, please call your pharmacy*   Lab Work: Not needed   Testing/Procedures:  Keep your appointment to have an Echo test completed on June 16 ,2025  Follow-Up: At [facility], you and your health needs are our priority.  As part of our continuing mission to provide you with exceptional heart care, we have created designated Provider Care Teams.  These Care Teams include your primary Cardiologist (physician) and Advanced Practice Providers (APPs -  Physician Assistants and Nurse Practitioners) who all work together to provide you with the care you need, when you need it.     Your next appointment:   Keep the appointment July  20205  The format for your next appointment:   In Person  Provider:   Marlana Silvan, NP

## 2023-07-16 NOTE — Progress Notes (Signed)
 Cardiology Office Note:  .   Date:  07/17/2023  ID:  Vicki [name], DOB 01/13/1924, MRN [id number] PCP: Lorenso Romance, MD  [facility] Providers Cardiologist:  Randene Bustard, MD     Chief Complaint  Patient presents with   Follow-up    Early follow-up due to scheduling error.  Was post to be seen after her echocardiogram.   Cardiomyopathy    Doing well.  Has not yet had echocardiogram performed   Congestive Heart Failure    Doing well with sliding scale Lasix     Patient Profile: .     Vicki [name] is a  89 y.o. female with a PMH notable for HFrEF (previous echo showed EF of 40 to 45% and most recently as low as 20 to 25%) with 3 sequential "NSTEMI "admissions in December 2024 then March 2025 with drop in EF, PAF in the setting of "MI ", HTN, HLD, CKD 3B, RA  who presents here for 6 week f/u for medication management and follow-up.  Recently admitted for non-STEMI with acute on chronic combined heart failure and A-fib RVR.  Noted to have stress-induced cardiomyopathy protein malnutrition.  Troponin peaked at roughly 3000 and significant drop in EF was concerning for possible Takotsubo cardiomyopathy.  She was mourning the loss of her son. => Cardiac Catheterization Declined and Not Offered. EF 20 to 25% => discharged on carvedilol  and losartan  along with 20 mg Lasix .  On oral amiodarone  to maintain sinus rhythm Plan was outpatient palliative care    Vicki [name] was last seen on 06/01/2023 for hospital f/u => (always accompanied by her daughter or daughter-in-law) she was actually doing fairly well.  She noted a lot of fatigue related to amiodarone  but apparently had been maintaining sinus rhythm.  Her weights were stable and they were adjusting her Lasix  accordingly.  We we discussed the issue with anticoagulation and they prefer aspirin .  Plan was for her to start physical therapy, and indicated she does still live alone.  No active anginal symptoms.  Maybe some  mild exertional dyspnea.  Walking limited by unsteady gait. => Plan was to continue amiodarone  and reduce to 1/2 tablet after 30 days.  Will start on low-dose losartan  12.5 mg daily. -> Decided against SGLT2 inhibitors because of recurrent UTIs. => Plan was to follow-up echo 1 to 3 months and then be seen shortly thereafter Unfortunately, our scheduling team scheduled her appointment prior to her echo appointment.  Subjective  Discussed the use of AI scribe software for clinical note transcription with the patient, who gave verbal consent to proceed.  History of Present Illness  Vicki [name] presents here today with her caregiver.  They noticed that she really did not do well with amiodarone  and they decided to reduce to the 1/2 tablet sooner than initially planned.  She noted that just made her have this 'funny' feeling of being 'out of it' on the higher dose. Her family member noted that the medication made her feel weird. No recent heart racing or irregular beats have been experienced.  Nothing to suggest recurrence of A-fib.  She is also taking losartan  at 12.5 mg (1/2 tab) dose, which she tolerates well without dizziness or low blood pressure symptoms. Her blood pressure at home usually runs around 130-140/80 mmHg. She experienced low blood pressure at the beginning of amiodarone  treatment but not since then.  She is on carvedilol , and her family member mentioned that she falls often due to balance  issues. She has a history of falls, one of which resulted in a leg injury. Her weight fluctuates between 138 to 141 pounds, and she manages her weight with Lasix  as needed. She is involved in physical therapy to regain strength and reports walking around her house regularly. She lives alone and manages her daily activities independently.  Her family member mentioned that she uses an [employer] that she calls "Vicki [name] "because of the face being Mickey Mouse.) to monitor her heart rate, which  alerts her if her heart rate is too fast. She has not had to increase her Lasix  recently and has been stable with her current regimen. She is not on a blood thinner due to fall risk but takes aspirin  daily.  No heart racing, irregular beats, or shortness of breath. No dizziness or low blood pressure symptoms with her current medication regimen.  She is actually doing very well with managing her diuretic for her daily weights.  For the most part they are taking 20 mg of Lasix  a day and maybe once twice a week takes an additional dose based on weight change.  She still has a somewhat unsteady gait but is feeling stronger working with PT.  She denies any chest pain or pressure but does get somewhat dyspneic when she overdoes it.  Takes a little while to recover.  She did have a recent episode where she was getting off of her bedside commode, lost her balance and hit her her left leg up against the foot of the bed causing abrasion bempedoic acid crossing of the leg.  She currently has a bandage in place.   Cardiovascular ROS: positive for - dyspnea on exertion, edema, and both pretty well-controlled.  Stable.  Somewhat unsteady gait or balance. negative for - chest pain, irregular heartbeat, orthopnea, palpitations, paroxysmal nocturnal dyspnea, rapid heart rate, shortness of breath, or syncope or near STEMI, TIA or amaurosis fugax, claudication    Objective   Cardiovascular Meds  Medication Sig   aspirin  EC 81 MG tablet Take 81 mg by mouth daily. Swallow whole.   carvedilol  (COREG ) 3.125 MG tablet Take 1 tablet (3.125 mg total) by mouth 2 (two) times daily with a meal.   feeding supplement (ENSURE ENLIVE / ENSURE PLUS) LIQD Take 237 mLs by mouth 2 (two) times daily between meals. (Patient taking differently: Take 237 mLs by mouth 2 (two) times daily between meals. Prefers chocolate Ensure. No Vanilla)   furosemide  (LASIX ) 20 MG tablet Take 20 mg by mouth daily.   isosorbide  mononitrate (IMDUR ) 30  MG 24 hr tablet Take 30 mg by mouth daily.   losartan  (COZAAR ) 25 MG tablet -> adjusted today Take 1/2  tablet (12.5 mg total) by mouth daily.   Omega-3 Fatty Acids (FISH OIL) 1000 MG CAPS Take 1,000 mg by mouth daily.   omeprazole (PRILOSEC) 20 MG capsule Take 20 mg by mouth daily.   [adjusted today] amiodarone  (PACERONE ) 200 MG tablet Reduced to 1/2  tablet daily.   Noncardiac Meds  Medication Sig   Biotin 10 MG CAPS Take 10 mg by mouth daily.   Calcium  Carb-Cholecalciferol (CALCIUM  600/VITAMIN D PO) Take 1 tablet by mouth daily.   Carboxymethylcellul-Glycerin (CLEAR EYES FOR DRY EYES OP) Place 2 drops into both eyes daily as needed (Dry eyes).   clotrimazole-betamethasone (LOTRISONE) cream Apply 1 Application topically 2 (two) times daily as needed (irritation).   feeding supplement (ENSURE ENLIVE / ENSURE PLUS) LIQD Take 237 mLs by mouth 2 (two) times daily  between meals. (Patient taking differently: Take 237 mLs by mouth 2 (two) times daily between meals. Prefers chocolate Ensure. No Vanilla)   levothyroxine  (SYNTHROID ) 50 MCG tablet Take 50 mcg by mouth daily.   melatonin 3 MG TABS tablet Take 6 mg by mouth at bedtime.   Multiple Vitamins-Minerals (THERA-M) TABS Take by mouth.   omeprazole (PRILOSEC) 20 MG capsule Take 20 mg by mouth daily.   Studies Reviewed: Aaron Aas       Lab Results  Component Value Date   NA 137 05/13/2023   K 3.8 05/13/2023   CREATININE 0.86 05/13/2023   GFRNONAA >60 05/13/2023   GLUCOSE 93 05/13/2023   No results found for: "CHOL", "HDL", "LDLCALC", "LDLDIRECT", "TRIG", "CHOLHDL" Lipid panel from [facility] 01/18/2023: TC 128, TG 107, HDL 38, LDL 69.  Previous Studies Reviewed: Echo 12/15/2019 [facility]): EF 55 to 60%.  Normal LV size and thickness.  GR 1 DD.  Mild LA dilation.  Mild MV thickening/calcification.  Mild AOV sclerosis with no stenosis.   ECHO 02/06/2023: LVEF 40 to 45% with mildly decreased function.  GR 2 DD/elevated LVEDP with moderate  LA dilation.  Mildly elevated RVSP with normal RV size and function-mildly elevated RAP with dilated IVC.  Moderate MAC with moderate MR.  Severe aortic valve calcification with mild AS. Echo 05/10/2023: LVEF 20 to 25%. With global hypokinesis and moderate LV dilation.  Unable to assess diastolic parameters.  PAP still mildly elevated at roughly 41.5 mmHg.Aaron Aas  Severe MR (likely functional).  Mean AoV gradient now 5.0-also likely functional.   Risk Assessment/Calculations:    CHA2DS2-VASc Score = 6   This indicates a 9.7% annual risk of stroke. The patient's score is based upon: CHF History: 1 HTN History: 1 Diabetes History: 0 Stroke History: 0 Vascular Disease History: 1 Age Score: 2 Gender Score: 1   Shared decision making: Patient and family decided against DOAC, are happy with aspirin  monotherapy.          Physical Exam:   VS:  BP 130/62 (BP Location: Right Arm, Patient Position: Sitting, Cuff Size: Normal)   Pulse 70   Ht 5\' 2"  (1.575 m)   Wt 141 lb 3.2 oz (64 kg)   SpO2 98%   BMI 25.83 kg/m    Wt Readings from Last 3 Encounters:  07/16/23 141 lb 3.2 oz (64 kg)  07/12/23 147 lb 0.8 oz (66.7 kg)  06/01/23 147 lb (66.7 kg)    GEN: Well nourished, well groomed in no acute distress; alert and oriented x 3.  Seems quite well for stated age. NECK: No JVD; No carotid bruits CARDIAC: Normal S1, S2; RRR, no murmurs, rubs, gallops RESPIRATORY:  Clear to auscultation without rales, wheezing or rhonchi ; nonlabored, good air movement. ABDOMEN: Soft, non-tender, non-distended EXTREMITIES: 1-2+ BLE edema; No deformity; left leg has dressing from recent injury     ASSESSMENT AND PLAN: .    Problem List Items Addressed This Visit       Cardiology Problems   Chronic combined systolic and diastolic CHF (congestive heart failure) (HCC) - Primary (Chronic)   Chronic heart failure with reduced ejection fraction.  Plan to optimize management with medication adjustments. Echocardiogram  scheduled to assess cardiac function.  Discussed potential for stress-induced cardiomyopathy and goal of improving function with medication. - Echocardiogram scheduled on June 16 to reassess cardiac function. - Increase losartan  to full 25 mg dose after current supply is finished. - Monitor for signs of fluid overload and adjust furosemide   as needed.=> Sliding scale Lasix -: Current home dry weight is roughly 138 to 140 pounds.  For weight gain> 3 pounds in 1 day or>5 pounds in 1 week, increase furosemide  dosing until back to baseline.      Relevant Medications   losartan  (COZAAR ) 25 MG tablet   amiodarone  (PACERONE ) 200 MG tablet   Essential hypertension (Chronic)   BP pretty well-controlled on carvedilol  and losartan .  Will increase losartan  to full 25 mg daily, and continue carvedilol  3.125 mg twice daily - Increase losartan  to full dose after current supply is finished. - Monitor blood pressure at home and report any significant changes, especially if systolic BP is below 110 mmHg. - If blood pressure is too low< (110 mmHg) or dizziness occurs, hold losartan  for that day.      Relevant Medications   losartan  (COZAAR ) 25 MG tablet   amiodarone  (PACERONE ) 200 MG tablet   Hyperlipidemia with target low density lipoprotein (LDL) cholesterol less than 70 mg/dL (Chronic)   Not overly important but she remains on rosuvastatin  10 mg daily with relatively well-controlled lipids-LDL of 69 on last check.      Relevant Medications   losartan  (COZAAR ) 25 MG tablet   amiodarone  (PACERONE ) 200 MG tablet   Paroxysmal atrial fibrillation (HCC) (Chronic)   Chronic atrial fibrillation managed with amiodarone .  Experienced adverse effects with full dose.  => Plan to reduce dose to minimize side effects while maintaining rhythm control.  => Discussed potential for increased heart rate or irregular rhythm as indicators of recurrence.  Emphasized monitoring heart rate and symptoms.  - Continue  amiodarone  100 mg daily until after echocardiogram, then reduce to every other day. - If heart rate increases or irregular rhythm occurs, increase amiodarone  to 200 mg daily for a few days, then reduce to 100 mg daily for a week, and then return to every other day dosing. - Monitor heart rate with device and report any significant changes. - Shared decision making: Patient and family decided against DOAC, are happy with aspirin  monotherapy.      Relevant Medications   losartan  (COZAAR ) 25 MG tablet   amiodarone  (PACERONE ) 200 MG tablet   Stress-induced cardiomyopathy   To have elevations with elevated troponin levels and significant drop in EF out of proportion with the troponin increase.  Both episodes were associated with significant social stress. Plan is to optimize as best as possible per medical therapy and reassess EF with hopes that it will improve back from work toward baseline      Relevant Medications   losartan  (COZAAR ) 25 MG tablet   amiodarone  (PACERONE ) 200 MG tablet     Other   Physical deconditioning (Chronic)   Doing better with physical therapy.  Still has unsteady gait with some falls.  This is more the main reasons why they want to avoid DOAC.      Protein-calorie malnutrition, severe (Chronic)   Again discussed the importance of increased nutrition, she does have supplemental protein shakes listed.            Follow-Up:  - Echocardiogram planned August 16, 2023 - Return in about 2 months (around 09/18/2023) for Follow-up with APP-Emily Monge, NP. - Follow-up with Dr. Addie Holstein 11/23/2023  Recording duration: 28 minutes Total time spent: 28 min spent with patient + 21 min spent charting = 49 min I spent 49 minutes in the care of LAURY HUIZAR today including reviewing labs (from epic), reviewing outside labs from [facility] (total time with  labs 1 minute), reviewing studies (reviewed previous echos-2 minutes), face to face time discussing treatment options (28  minutes), reviewing records from previous hospitalizations were most notably last note.  (4 minutes), 13 minutes dictating, and documenting in the encounter.     Signed, Arleen Lacer, MD, MS Randene Bustard, M.D., M.S. Interventional Chartered certified accountant  Pager # [phone]

## 2023-07-17 ENCOUNTER — Encounter: Payer: Self-pay | Admitting: Cardiology

## 2023-07-17 NOTE — Assessment & Plan Note (Signed)
 Doing better with physical therapy.  Still has unsteady gait with some falls.  This is more the main reasons why they want to avoid DOAC.

## 2023-07-17 NOTE — Assessment & Plan Note (Signed)
 Chronic atrial fibrillation managed with amiodarone .  Experienced adverse effects with full dose.  => Plan to reduce dose to minimize side effects while maintaining rhythm control.  => Discussed potential for increased heart rate or irregular rhythm as indicators of recurrence.  Emphasized monitoring heart rate and symptoms.  - Continue amiodarone  100 mg daily until after echocardiogram, then reduce to every other day. - If heart rate increases or irregular rhythm occurs, increase amiodarone  to 200 mg daily for a few days, then reduce to 100 mg daily for a week, and then return to every other day dosing. - Monitor heart rate with device and report any significant changes. - Shared decision making: Patient and family decided against DOAC, are happy with aspirin  monotherapy.

## 2023-07-17 NOTE — Assessment & Plan Note (Addendum)
 BP pretty well-controlled on carvedilol  and losartan .  Will increase losartan  to full 25 mg daily, and continue carvedilol  3.125 mg twice daily - Increase losartan  to full dose after current supply is finished. - Monitor blood pressure at home and report any significant changes, especially if systolic BP is below 110 mmHg. - If blood pressure is too low< (110 mmHg) or dizziness occurs, hold losartan  for that day.

## 2023-07-17 NOTE — Assessment & Plan Note (Signed)
 Not overly important but she remains on rosuvastatin  10 mg daily with relatively well-controlled lipids-LDL of 69 on last check.

## 2023-07-17 NOTE — Assessment & Plan Note (Signed)
 To have elevations with elevated troponin levels and significant drop in EF out of proportion with the troponin increase.  Both episodes were associated with significant social stress. Plan is to optimize as best as possible per medical therapy and reassess EF with hopes that it will improve back from work toward baseline

## 2023-07-17 NOTE — Assessment & Plan Note (Signed)
 Again discussed the importance of increased nutrition, she does have supplemental protein shakes listed.

## 2023-07-17 NOTE — Assessment & Plan Note (Signed)
 Chronic heart failure with reduced ejection fraction.  Plan to optimize management with medication adjustments. Echocardiogram scheduled to assess cardiac function.  Discussed potential for stress-induced cardiomyopathy and goal of improving function with medication. - Echocardiogram scheduled on June 16 to reassess cardiac function. - Increase losartan  to full 25 mg dose after current supply is finished. - Monitor for signs of fluid overload and adjust furosemide  as needed.=> Sliding scale Lasix -: Current home dry weight is roughly 138 to 140 pounds.  For weight gain> 3 pounds in 1 day or>5 pounds in 1 week, increase furosemide  dosing until back to baseline.

## 2023-08-03 ENCOUNTER — Ambulatory Visit: Admitting: Cardiology

## 2023-08-09 ENCOUNTER — Other Ambulatory Visit: Payer: Self-pay

## 2023-08-16 ENCOUNTER — Ambulatory Visit: Admitting: Cardiology

## 2023-08-16 ENCOUNTER — Ambulatory Visit (HOSPITAL_COMMUNITY)
Admission: RE | Admit: 2023-08-16 | Discharge: 2023-08-16 | Disposition: A | Discharge status: Home / Self Care | Source: Ambulatory Visit | Attending: Cardiology | Admitting: Cardiology

## 2023-08-16 DIAGNOSIS — I10 Essential (primary) hypertension: Secondary | ICD-10-CM

## 2023-08-16 LAB — ECHOCARDIOGRAM COMPLETE
Area-P 1/2: 4.8 cm2
S' Lateral: 4.79 cm

## 2023-08-18 ENCOUNTER — Ambulatory Visit: Payer: Self-pay | Admitting: Cardiology

## 2023-08-19 ENCOUNTER — Ambulatory Visit: Admitting: Nurse Practitioner

## 2023-08-27 ENCOUNTER — Encounter: Payer: Self-pay | Admitting: Nurse Practitioner

## 2023-08-27 ENCOUNTER — Ambulatory Visit: Attending: Nurse Practitioner | Admitting: Nurse Practitioner

## 2023-08-27 VITALS — BP 102/60 | HR 48 | Ht 62.0 in | Wt 143.6 lb

## 2023-08-27 DIAGNOSIS — I10 Essential (primary) hypertension: Secondary | ICD-10-CM

## 2023-08-27 DIAGNOSIS — I480 Paroxysmal atrial fibrillation: Secondary | ICD-10-CM

## 2023-08-27 DIAGNOSIS — I340 Nonrheumatic mitral (valve) insufficiency: Secondary | ICD-10-CM

## 2023-08-27 DIAGNOSIS — N1832 Chronic kidney disease, stage 3b: Secondary | ICD-10-CM

## 2023-08-27 DIAGNOSIS — E039 Hypothyroidism, unspecified: Secondary | ICD-10-CM

## 2023-08-27 DIAGNOSIS — I5020 Unspecified systolic (congestive) heart failure: Secondary | ICD-10-CM | POA: Diagnosis not present

## 2023-08-27 DIAGNOSIS — E785 Hyperlipidemia, unspecified: Secondary | ICD-10-CM

## 2023-08-27 NOTE — Patient Instructions (Addendum)
 Medication Instructions:  Stop Carvedilol  3.125 mg as directed Continue Amiodarone  100 mg every other day  *If you need a refill on your cardiac medications before your next appointment, please call your pharmacy*  Lab Work: NONE ordered at this time of appointment    Testing/Procedures: NONE ordered at this time of appointment    Follow-Up: At [facility], you and your health needs are our priority.  As part of our continuing mission to provide you with exceptional heart care, our providers are all part of one team.  This team includes your primary Cardiologist (physician) and Advanced Practice Providers or APPs (Physician Assistants and Nurse Practitioners) who all work together to provide you with the care you need, when you need it.  Your next appointment:    Keep follow up appointment   Provider:   Alm Clay, MD    We recommend signing up for the patient portal called MyChart.  Sign up information is provided on this After Visit Summary.  MyChart is used to connect with patients for Virtual Visits (Telemedicine).  Patients are able to view lab/test results, encounter notes, upcoming appointments, etc.  Non-urgent messages can be sent to your provider as well.   To learn more about what you can do with MyChart, go to [email or web address].

## 2023-08-27 NOTE — Progress Notes (Unsigned)
 Office Visit    Patient Name: Vicki [name] Date of Encounter: 08/27/2023  Primary Care Provider:  Clarance Joen HERO, MD Primary Cardiologist:  Alm Clay, MD  Chief Complaint    89 year old female with a history of NSTEMI, paroxysmal atrial fibrillation, chronic HFrEF, mitral valve regurgitation, hypertension, hyperlipidemia, CKD stage IIIb, hypothyroidism, and rheumatoid arthritis who presents for follow-up related to heart failure and atrial fibrillation.  Past Medical History    Past Medical History:  Diagnosis Date   Hypertension    Hypothyroidism    No past surgical history on file.  Allergies  Allergies  Allergen Reactions   Codeine Nausea Only    Makes her crazy Hallucinations    Latex Itching   Penicillins Rash    YEAST INFECTION  YEAST INFECTION   Sulfa Antibiotics Anaphylaxis and Other (See Comments)    Heart stops   Thiopental Anaphylaxis and Rash    Other reaction(s): Unknown  CARDIAC ARREST  CARDIAC ARREST   Statins Itching    Itchy scalp with hair loss, confusion     Labs/Other Studies Reviewed    The following studies were reviewed today:  Cardiac Studies & Procedures   ______________________________________________________________________________________________     ECHOCARDIOGRAM  ECHOCARDIOGRAM COMPLETE 08/16/2023  Narrative ECHOCARDIOGRAM REPORT    Patient Name:   Vicki [name] Date of Exam: 08/16/2023 Medical Rec #:  [id number]       Height:       62.0 in Accession #:    [id number]      Weight:       141.2 lb Date of Birth:  December 30, 1923      BSA:          1.649 m Patient Age:    89 years        BP:           130/62 mmHg Patient Gender: F               HR:           72 bpm. Exam Location:  [address]  Procedure: 2D Echo and 3D Echo (Both Spectral and Color Flow Doppler were utilized during procedure).  Indications:    I10 Hypertension  History:        Patient has prior history of Echocardiogram examinations,  most recent 05/10/2023. Cardiomyopathy and CHF, Previous Myocardial Infarction, Arrythmias:Atrial Fibrillation; Risk Factors:Dyslipidemia. Hypothyroidism.  Sonographer:    Jon Hacker RCS Referring Phys: [phone] DAVID W HARDING  IMPRESSIONS   1. Left ventricular ejection fraction, by estimation, is 25 to 30%. The left ventricle has severely decreased function. The left ventricle demonstrates global hypokinesis. The left ventricular internal cavity size was mildly dilated. Left ventricular diastolic parameters are consistent with Grade II diastolic dysfunction (pseudonormalization). Elevated left atrial pressure. 2. Right ventricular systolic function is normal. The right ventricular size is normal. 3. Left atrial size was moderately dilated. 4. The mitral valve is normal in structure. Severe mitral valve regurgitation. No evidence of mitral stenosis. 5. The aortic valve is tricuspid. Aortic valve regurgitation is not visualized. No aortic stenosis is present. 6. The inferior vena cava is normal in size with greater than 50% respiratory variability, suggesting right atrial pressure of 3 mmHg.  FINDINGS Left Ventricle: Left ventricular ejection fraction, by estimation, is 25 to 30%. The left ventricle has severely decreased function. The left ventricle demonstrates global hypokinesis. The left ventricular internal cavity size was mildly dilated. There is no left ventricular hypertrophy. Left ventricular diastolic parameters  are consistent with Grade II diastolic dysfunction (pseudonormalization). Elevated left atrial pressure.  Right Ventricle: The right ventricular size is normal. Right ventricular systolic function is normal.  Left Atrium: Left atrial size was moderately dilated.  Right Atrium: Right atrial size was normal in size.  Pericardium: There is no evidence of pericardial effusion.  Mitral Valve: The mitral valve is normal in structure. Mild mitral annular calcification. Severe  mitral valve regurgitation. No evidence of mitral valve stenosis.  Tricuspid Valve: The tricuspid valve is normal in structure. Tricuspid valve regurgitation is mild . No evidence of tricuspid stenosis.  Aortic Valve: The aortic valve is tricuspid. Aortic valve regurgitation is not visualized. No aortic stenosis is present.  Pulmonic Valve: The pulmonic valve was normal in structure. Pulmonic valve regurgitation is mild. No evidence of pulmonic stenosis.  Aorta: The aortic root is normal in size and structure.  Venous: The inferior vena cava is normal in size with greater than 50% respiratory variability, suggesting right atrial pressure of 3 mmHg.  IAS/Shunts: No atrial level shunt detected by color flow Doppler.  Additional Comments: 3D was performed not requiring image post processing on an independent workstation and was abnormal.   LEFT VENTRICLE PLAX 2D LVIDd:         5.68 cm   Diastology LVIDs:         4.79 cm   LV e' medial:    4.43 cm/s LV PW:         1.14 cm   LV E/e' medial:  30.0 LV IVS:        0.87 cm   LV e' lateral:   10.80 cm/s LVOT diam:     2.00 cm   LV E/e' lateral: 12.3 LV SV:         73 LV SV Index:   44 LVOT Area:     3.14 cm  3D Volume EF: 3D EF:        37 % LV EDV:       191 ml LV ESV:       120 ml LV SV:        71 ml  RIGHT VENTRICLE RV Basal diam:  3.16 cm RV S prime:     10.60 cm/s TAPSE (M-mode): 1.7 cm  LEFT ATRIUM             Index        RIGHT ATRIUM           Index LA diam:        5.20 cm 3.15 cm/m   RA Area:     13.60 cm LA Vol (A2C):   48.7 ml 29.54 ml/m  RA Volume:   32.10 ml  19.47 ml/m LA Vol (A4C):   64.3 ml 39.00 ml/m LA Biplane Vol: 56.7 ml 34.39 ml/m AORTIC VALVE LVOT Vmax:   123.00 cm/s LVOT Vmean:  71.800 cm/s LVOT VTI:    0.231 m  AORTA Ao Root diam: 3.00 cm  MITRAL VALVE MV Area (PHT): 4.80 cm     SHUNTS MV Decel Time: 158 msec     Systemic VTI:  0.23 m MV E velocity: 133.00 cm/s  Systemic Diam: 2.00 cm MV A  velocity: 99.20 cm/s MV E/A ratio:  1.34  Redell Shallow MD Electronically signed by Redell Shallow MD Signature Date/Time: 08/16/2023/1:55:25 PM    Final          ______________________________________________________________________________________________     Recent Labs: 02/06/2023: TSH 1.176 05/08/2023: B Natriuretic Peptide  1,228.6 05/09/2023: ALT 39 05/13/2023: BUN 18; Creatinine, Ser 0.86; Hemoglobin 10.3; Magnesium 2.2; Platelets 214; Potassium 3.8; Sodium 137  Recent Lipid Panel No results found for: CHOL, TRIG, HDL, CHOLHDL, VLDL, LDLCALC, LDLDIRECT  History of Present Illness    89 year old female with the above past medical history including NSTEMI, paroxysmal atrial fibrillation, chronic HFrEF, mitral valve regurgitation, hypertension, hyperlipidemia, CKD stage IIIb, hypothyroidism, and rheumatoid arthritis.   Prior EF 40 to 45%. She has had 3 sequential hospitalizations in the setting of NSTEMI.  She was hospitalized most recently in March 2025 in the setting of acute on chronic HFrEF, atrial fibrillation with RVR.  Echocardiogram in 05/2023 showed EF 20 to 25%, severely decreased LV function, moderately reduced RV systolic function, severely dilated left atrium, severe mitral valve regurgitation, mild aortic valve stenosis, mean gradient 5 mmHg.  She declined cardiac catheterization.  She was started on amiodarone .  She declined anticoagulation.  She was last seen in the office on 07/16/2023 was stable overall from a cardiac standpoint.  She did report significant fatigue on amiodarone .  Dose was reduced to 100 mg every other day.  Repeat echocardiogram in 08/2023 showed EF 25 to 30%, severely decreased LV function, LV global hypokinesis, G2 DD, normal RV systolic function, severe mitral valve regurgitation, no evidence of aortic valve stenosis.  Titration of GDMT was recommended.  She presents today for follow-up accompanied by her daughter-in-law. Since her  last visit she has been stable overall from a cardiac standpoint.  She has had intermittent episodes of elevated heart rate during which she feels her heart racing, this usually occurs in the setting of constipation and after a large bowel movement. She reports that she almost went to the emergency room 2 nights ago due to her symptoms. She denies any dizziness, presyncope or syncope, denies chest pain, dyspnea, edema, PND, orthopnea, weight gain.    Home Medications    Current Outpatient Medications  Medication Sig Dispense Refill   amiodarone  (PACERONE ) 200 MG tablet Take 0.5 tablets (100 mg total) by mouth every other day. Starting on June 16,2025 30 tablet 6   aspirin  EC 81 MG tablet Take 81 mg by mouth daily. Swallow whole.     Biotin 10 MG CAPS Take 10 mg by mouth daily.     Calcium  Carb-Cholecalciferol (CALCIUM  600/VITAMIN D PO) Take 1 tablet by mouth daily.     Carboxymethylcellul-Glycerin (CLEAR EYES FOR DRY EYES OP) Place 2 drops into both eyes daily as needed (Dry eyes).     clotrimazole-betamethasone (LOTRISONE) cream Apply 1 Application topically 2 (two) times daily as needed (irritation).     feeding supplement (ENSURE ENLIVE / ENSURE PLUS) LIQD Take 237 mLs by mouth 2 (two) times daily between meals. (Patient taking differently: Take 237 mLs by mouth 2 (two) times daily between meals. Prefers chocolate Ensure. No Vanilla)     furosemide  (LASIX ) 20 MG tablet Take 20 mg by mouth daily.     isosorbide  mononitrate (IMDUR ) 30 MG 24 hr tablet Take 30 mg by mouth daily.     levothyroxine  (SYNTHROID ) 50 MCG tablet Take 50 mcg by mouth daily.     losartan  (COZAAR ) 25 MG tablet Take 1 tablet (25 mg total) by mouth daily. 90 tablet 3   melatonin 3 MG TABS tablet Take 6 mg by mouth at bedtime.     Multiple Vitamins-Minerals (THERA-M) TABS Take by mouth.     Omega-3 Fatty Acids (FISH OIL) 1000 MG CAPS Take 1,000 mg by mouth daily.  omeprazole (PRILOSEC) 20 MG capsule Take 20 mg by mouth  daily.     No current facility-administered medications for this visit.     Review of Systems    She denies chest pain, dyspnea, pnd, orthopnea, n, v, dizziness, syncope, edema, weight gain, or early satiety. All other systems reviewed and are otherwise negative except as noted above.   Physical Exam    VS:  BP 102/60   Pulse (!) 48   Ht 5' 2 (1.575 m)   Wt 143 lb 9.6 oz (65.1 kg)   SpO2 97%   BMI 26.26 kg/m   GEN: Well nourished, well developed, in no acute distress. HEENT: normal. Neck: Supple, no JVD, carotid bruits, or masses. Cardiac: RRR, no murmurs, rubs, or gallops. No clubbing, cyanosis, edema.  Radials/DP/PT 2+ and equal bilaterally.  Respiratory:  Respirations regular and unlabored, clear to auscultation bilaterally. GI: Soft, nontender, nondistended, BS + x 4. MS: no deformity or atrophy. Skin: warm and dry, no rash. Neuro:  Strength and sensation are intact. Psych: Normal affect.  Accessory Clinical Findings    ECG personally reviewed by me today - EKG Interpretation Date/Time:  Friday August 27 2023 11:05:14 EDT Ventricular Rate:  48 PR Interval:    QRS Duration:  150 QT Interval:  458 QTC Calculation: 409 R Axis:   183  Text Interpretation: Junctional rhythm Right superior axis deviation Non-specific intra-ventricular conduction block Nonspecific ST and T wave abnormality When compared with ECG of 01-Jun-2023 12:27, Wide QRS rhythm has replaced Sinus rhythm Confirmed by Daneen Perkins ([id number]) on 08/27/2023 1:53:49 PM  - no acute changes.   Lab Results  Component Value Date   WBC 10.5 05/13/2023   HGB 10.3 (L) 05/13/2023   HCT 31.2 (L) 05/13/2023   MCV 94.5 05/13/2023   PLT 214 05/13/2023   Lab Results  Component Value Date   CREATININE 0.86 05/13/2023   BUN 18 05/13/2023   NA 137 05/13/2023   K 3.8 05/13/2023   CL 109 05/13/2023   CO2 20 (L) 05/13/2023   Lab Results  Component Value Date   ALT 39 05/09/2023   AST 56 (H) 05/09/2023   ALKPHOS 42  05/09/2023   BILITOT 0.4 05/09/2023   No results found for: CHOL, HDL, LDLCALC, LDLDIRECT, TRIG, CHOLHDL  Lab Results  Component Value Date   HGBA1C 5.4 05/09/2023    Assessment & Plan   1. History of NSTEMI: Echocardiogram in 05/2023 showed EF 20 to 25%, severely decreased LV function, moderately reduced RV systolic function, severely dilated left atrium, severe mitral valve regurgitation, mild aortic valve stenosis, mean gradient 5 mmHg.  She declined cardiac catheterization. Stable with no anginal symptoms. Continue aspirin , losartan , carvedilol , Imdur , Lasix , and Crestor .  2. Chronic HFrEF: Most recent echo as above.  Euvolemic and well compensated on exam. Escalation of GDMT limited in the setting of borderline low BP. Continue current medications as above.  In the setting of multiple comorbidities, we again discussed the possible need for palliative care in the near future.  She is not interested in pursuing palliative care at this time, but will consider this in the future.  3. Paroxysmal atrial fibrillation/bradycardia: EKG today shows junctional rhythm, HR 48 bpm.  She has continued to take amiodarone  100 mg daily in the setting of intermittent palpitations.  She felt poorly on amiodarone  200 mg daily.  Reviewed with Dr. Anner, will discontinue carvedilol .  Continue to monitor HR, symptoms.  Reviewed ED precautions.  She reports she will  have labs drawn with her PCP next week, will defer additional labs today.  Continue amiodarone .  3. Mitral valve regurgitation: Severe on most recent echo as above.  I confirmed today that she does not wish to pursue any invasive procedure/surgical intervention.   4. Hypertension: BP well controlled. Continue current antihypertensive regimen.   5. Hyperlipidemia: LDL was 69 in 01/2023.  Continue Crestor .  6. CKD stage IIIb: Creatinine was stable at 0.86 in 05/2023.  7. Hypothyroidism: TSH was stable at 1.176 in 01/2023.  On  levothyroxine .  Monitored and managed per PCP.  8. Disposition: Follow-up as scheduled with Dr. Anner in 11/2023, sooner if needed.  Damien JAYSON Braver, NP 08/29/2023, 12:56 PM

## 2023-08-29 ENCOUNTER — Encounter: Payer: Self-pay | Admitting: Nurse Practitioner

## 2023-09-01 ENCOUNTER — Inpatient Hospital Stay (HOSPITAL_COMMUNITY)
Admission: EM | Admit: 2023-09-01 | Discharge: 2023-09-05 | DRG: 281 | Disposition: A | Discharge status: Home Health | Attending: Internal Medicine | Admitting: Internal Medicine

## 2023-09-01 ENCOUNTER — Other Ambulatory Visit: Payer: Self-pay

## 2023-09-01 ENCOUNTER — Emergency Department (HOSPITAL_COMMUNITY)

## 2023-09-01 DIAGNOSIS — I5084 End stage heart failure: Secondary | ICD-10-CM | POA: Diagnosis present

## 2023-09-01 DIAGNOSIS — Z66 Do not resuscitate: Secondary | ICD-10-CM | POA: Diagnosis present

## 2023-09-01 DIAGNOSIS — I2510 Atherosclerotic heart disease of native coronary artery without angina pectoris: Secondary | ICD-10-CM | POA: Diagnosis present

## 2023-09-01 DIAGNOSIS — I130 Hypertensive heart and chronic kidney disease with heart failure and stage 1 through stage 4 chronic kidney disease, or unspecified chronic kidney disease: Secondary | ICD-10-CM | POA: Diagnosis present

## 2023-09-01 DIAGNOSIS — Z79899 Other long term (current) drug therapy: Secondary | ICD-10-CM

## 2023-09-01 DIAGNOSIS — I252 Old myocardial infarction: Secondary | ICD-10-CM

## 2023-09-01 DIAGNOSIS — Z888 Allergy status to other drugs, medicaments and biological substances status: Secondary | ICD-10-CM

## 2023-09-01 DIAGNOSIS — Z91040 Latex allergy status: Secondary | ICD-10-CM

## 2023-09-01 DIAGNOSIS — I509 Heart failure, unspecified: Secondary | ICD-10-CM

## 2023-09-01 DIAGNOSIS — Z882 Allergy status to sulfonamides status: Secondary | ICD-10-CM

## 2023-09-01 DIAGNOSIS — E039 Hypothyroidism, unspecified: Secondary | ICD-10-CM | POA: Diagnosis present

## 2023-09-01 DIAGNOSIS — R54 Age-related physical debility: Secondary | ICD-10-CM | POA: Diagnosis present

## 2023-09-01 DIAGNOSIS — I214 Non-ST elevation (NSTEMI) myocardial infarction: Secondary | ICD-10-CM | POA: Diagnosis not present

## 2023-09-01 DIAGNOSIS — M069 Rheumatoid arthritis, unspecified: Secondary | ICD-10-CM | POA: Diagnosis present

## 2023-09-01 DIAGNOSIS — L03116 Cellulitis of left lower limb: Secondary | ICD-10-CM | POA: Diagnosis present

## 2023-09-01 DIAGNOSIS — I5042 Chronic combined systolic (congestive) and diastolic (congestive) heart failure: Secondary | ICD-10-CM | POA: Diagnosis present

## 2023-09-01 DIAGNOSIS — I340 Nonrheumatic mitral (valve) insufficiency: Secondary | ICD-10-CM | POA: Diagnosis present

## 2023-09-01 DIAGNOSIS — S81802A Unspecified open wound, left lower leg, initial encounter: Secondary | ICD-10-CM | POA: Insufficient documentation

## 2023-09-01 DIAGNOSIS — I493 Ventricular premature depolarization: Secondary | ICD-10-CM | POA: Diagnosis present

## 2023-09-01 DIAGNOSIS — I10 Essential (primary) hypertension: Secondary | ICD-10-CM | POA: Diagnosis present

## 2023-09-01 DIAGNOSIS — I480 Paroxysmal atrial fibrillation: Secondary | ICD-10-CM | POA: Diagnosis present

## 2023-09-01 DIAGNOSIS — N179 Acute kidney failure, unspecified: Secondary | ICD-10-CM | POA: Diagnosis present

## 2023-09-01 DIAGNOSIS — E785 Hyperlipidemia, unspecified: Secondary | ICD-10-CM | POA: Diagnosis present

## 2023-09-01 DIAGNOSIS — N182 Chronic kidney disease, stage 2 (mild): Secondary | ICD-10-CM | POA: Diagnosis present

## 2023-09-01 DIAGNOSIS — Z885 Allergy status to narcotic agent status: Secondary | ICD-10-CM

## 2023-09-01 DIAGNOSIS — Z7982 Long term (current) use of aspirin: Secondary | ICD-10-CM

## 2023-09-01 DIAGNOSIS — Z79890 Hormone replacement therapy (postmenopausal): Secondary | ICD-10-CM

## 2023-09-01 DIAGNOSIS — Z880 Allergy status to penicillin: Secondary | ICD-10-CM

## 2023-09-01 LAB — BASIC METABOLIC PANEL WITH GFR
Anion gap: 11 (ref 5–15)
BUN: 21 mg/dL (ref 8–23)
CO2: 20 mmol/L — ABNORMAL LOW (ref 22–32)
Calcium: 9.2 mg/dL (ref 8.9–10.3)
Chloride: 103 mmol/L (ref 98–111)
Creatinine, Ser: 1.05 mg/dL — ABNORMAL HIGH (ref 0.44–1.00)
GFR, Estimated: 48 mL/min — ABNORMAL LOW (ref >60)
Glucose, Bld: 168 mg/dL — ABNORMAL HIGH (ref 70–99)
Potassium: 4.1 mmol/L (ref 3.5–5.1)
Sodium: 134 mmol/L — ABNORMAL LOW (ref 135–145)

## 2023-09-01 LAB — CBC
HCT: 37.6 % (ref 36.0–46.0)
Hemoglobin: 12.2 g/dL (ref 12.0–15.0)
MCH: 30.2 pg (ref 26.0–34.0)
MCHC: 32.4 g/dL (ref 30.0–36.0)
MCV: 93.1 fL (ref 80.0–100.0)
Platelets: 278 10*3/uL (ref 150–400)
RBC: 4.04 MIL/uL (ref 3.87–5.11)
RDW: 16.9 % — ABNORMAL HIGH (ref 11.5–15.5)
WBC: 9.3 10*3/uL (ref 4.0–10.5)
nRBC: 0 % (ref 0.0–0.2)

## 2023-09-01 LAB — BRAIN NATRIURETIC PEPTIDE: B Natriuretic Peptide: 820.3 pg/mL — ABNORMAL HIGH (ref 0.0–100.0)

## 2023-09-01 LAB — TROPONIN I (HIGH SENSITIVITY): Troponin I (High Sensitivity): 47 ng/L — ABNORMAL HIGH (ref <18)

## 2023-09-01 MED ORDER — IOHEXOL 350 MG/ML SOLN
75.0000 mL | Freq: Once | INTRAVENOUS | Status: AC | PRN
  Administered 2023-09-01: 75 mL via INTRAVENOUS

## 2023-09-01 NOTE — ED Provider Triage Note (Signed)
 Emergency Medicine Provider Triage Evaluation Note  Vicki [name] , a 89 y.o. female  was evaluated in triage.  Pt complains of chest pain that radiates through to the back.  Patient endorses a past medical history of CHF.  She states that sometimes when she has a bowel movement and bears down she gets chest pain.  However, this particular time she she got tachycardic per her Apple Watch to this episode and the chest pain was quite severe.  Currently improving in symptoms.  Review of Systems  Positive:  Negative: See above   Physical Exam  BP (!) 144/86 (BP Location: Right Arm)   Pulse 98   Temp 98.3 F (36.8 C) (Oral)   Resp 18   Ht 5' 2 (1.575 m)   Wt 63.5 kg   SpO2 97%   BMI 25.61 kg/m  Gen:   Awake, no distress   Resp:  Normal effort  MSK:   Moves extremities without difficulty  Other:  Trace pitting edema up to the midshin.  Medical Decision Making  Medically screening exam initiated at 9:53 PM.  Appropriate orders placed.  AIRELLE EVERDING was informed that the remainder of the evaluation will be completed by another provider, this initial triage assessment does not replace that evaluation, and the importance of remaining in the ED until their evaluation is complete.     Theotis Peers [place], [place] 09/01/23 [id number]

## 2023-09-01 NOTE — ED Provider Notes (Signed)
 [place] EMERGENCY DEPARTMENT AT [facility] Provider Note   CSN: [id number] Arrival date & time: 09/01/23  2059     Patient presents with: Chest Pain   Vicki [name] is a 89 y.o. female.    Chest Pain Patient is a 89 year old female was in the ED today with concerns for chest pain that began approximately 4 hours ago while she was having a bowel movement, accompanied by her daughter-in-law and granddaughter.  Medical history of NSTEMI, combined systolic/diastolic CHF, PAF, HTN, hypothyroidism, rheumatoid arthritis, HLD, mitral valve regurgitation, severely dilated left atrium, mild aortic valve stenosis.  Last hospitalization was in March 2025, recently saw cardiologist earlier this past week.  Describes the pain as pressure as well as burning.  States that the episode was severe while she was straining during the bowel movement.  However currently is feeling significantly better and is no longer experiencing symptoms.  States that she does have some lower leg swelling but that it is significantly improved.  States that she is also concerned about a small boil left anterior leg near wound, with family stating that the wound from a skin tear that appears to be well-healing and that she has recently finished up her Keflex which was for a possible cellulitis around the wound site.  Notes that it is mildly tender to touch.  Denies fever, headache, vision changes, cough, hematemesis, hemoptysis, shortness of breath, abdominal pain, nausea, vomiting, hematochezia, melena   Provided aspirin  by EMS.  Prior to Admission medications   Medication Sig Start Date End Date Taking? Authorizing Provider  amiodarone  (PACERONE ) 200 MG tablet Take 0.5 tablets (100 mg total) by mouth every other day. Starting on June 16,2025 07/16/23   Anner Alm ORN, MD  aspirin  EC 81 MG tablet Take 81 mg by mouth daily. Swallow whole.    [provider]  Biotin 10 MG CAPS Take 10 mg by  mouth daily.    [provider]  Calcium  Carb-Cholecalciferol (CALCIUM  600/VITAMIN D PO) Take 1 tablet by mouth daily.    [provider]  Carboxymethylcellul-Glycerin (CLEAR EYES FOR DRY EYES OP) Place 2 drops into both eyes daily as needed (Dry eyes).    [provider]  clotrimazole-betamethasone (LOTRISONE) cream Apply 1 Application topically 2 (two) times daily as needed (irritation). 11/18/20 11/05/23  [provider]  feeding supplement (ENSURE ENLIVE / ENSURE PLUS) LIQD Take 237 mLs by mouth 2 (two) times daily between meals. Patient taking differently: Take 237 mLs by mouth 2 (two) times daily between meals. Prefers chocolate Ensure. No Vanilla 02/09/23   Briana Elgin LABOR, MD  furosemide  (LASIX ) 20 MG tablet Take 20 mg by mouth daily. 09/13/20   [provider]  isosorbide  mononitrate (IMDUR ) 30 MG 24 hr tablet Take 30 mg by mouth daily. 04/28/23   [provider]  levothyroxine  (SYNTHROID ) 50 MCG tablet Take 50 mcg by mouth daily. 09/05/13   [provider]  losartan  (COZAAR ) 25 MG tablet Take 1 tablet (25 mg total) by mouth daily. 07/16/23 10/14/23  Anner Alm ORN, MD  melatonin 3 MG TABS tablet Take 6 mg by mouth at bedtime.    [provider]  Multiple Vitamins-Minerals (THERA-M) TABS Take by mouth.    [provider]  Omega-3 Fatty Acids (FISH OIL) 1000 MG CAPS Take 1,000 mg by mouth daily.    [provider]  omeprazole (PRILOSEC) 20 MG capsule Take 20 mg by mouth daily.    [provider]  Allergies: Codeine, Latex, Penicillins, Sulfa antibiotics, Thiopental, and Statins    Review of Systems  Cardiovascular:  Positive for chest pain.    Updated Vital Signs BP (!) 144/86 (BP Location: Right Arm)   Pulse 98   Temp 98.3 F (36.8 C) (Oral)   Resp 18   Ht 5' 2 (1.575 m)   Wt 63.5 kg   SpO2 97%   BMI 25.61 kg/m   Physical Exam Vitals and nursing note reviewed.  Constitutional:       General: She is not in acute distress.    Appearance: Normal appearance. She is not ill-appearing or diaphoretic.  HENT:     Head: Normocephalic and atraumatic.  Eyes:     General:        Right eye: No discharge.        Left eye: No discharge.     Extraocular Movements: Extraocular movements intact.     Conjunctiva/sclera: Conjunctivae normal.  Neck:     Vascular: No hepatojugular reflux or JVD.  Cardiovascular:     Rate and Rhythm: Normal rate and regular rhythm.     Pulses: Normal pulses.     Heart sounds: Normal heart sounds. No murmur heard.    No friction rub. No gallop.  Pulmonary:     Effort: Pulmonary effort is normal. No tachypnea, accessory muscle usage or respiratory distress.     Breath sounds: Normal breath sounds. No stridor. No decreased breath sounds, wheezing, rhonchi or rales.  Chest:     Chest wall: No tenderness.  Abdominal:     General: Abdomen is flat. There is no distension.     Palpations: Abdomen is soft. There is no mass.     Tenderness: There is abdominal tenderness (Noted to have some generalized abdominal tenderness to palpation with no localization.). There is no right CVA tenderness, left CVA tenderness, guarding or rebound.  Musculoskeletal:     Cervical back: Normal range of motion. No rigidity.     Right lower leg: No tenderness. Edema present.     Left lower leg: No tenderness. Edema present.  Skin:    General: Skin is warm and dry.     Findings: Erythema (Mildly erythematous around wound on left anterior leg, noting small area of fluctuance approximately 1.5 x 1.5 cm.  Near wound which appears to be well-healing.) present. No ecchymosis.  Neurological:     General: No focal deficit present.     Mental Status: She is alert and oriented to person, place, and time. Mental status is at baseline.     Cranial Nerves: No cranial nerve deficit.     Motor: No weakness.  Psychiatric:        Mood and Affect: Mood normal. Mood is not anxious.      (all labs ordered are listed, but only abnormal results are displayed) Labs Reviewed  BASIC METABOLIC PANEL WITH GFR - Abnormal; Notable for the following components:      Result Value   Sodium 134 (*)    CO2 20 (*)    Glucose, Bld 168 (*)    Creatinine, Ser 1.05 (*)    GFR, Estimated 48 (*)    All other components within normal limits  CBC - Abnormal; Notable for the following components:   RDW 16.9 (*)    All other components within normal limits  TROPONIN I (HIGH SENSITIVITY) - Abnormal; Notable for the following components:   Troponin I (High Sensitivity) 47 (*)    All other components  within normal limits  BRAIN NATRIURETIC PEPTIDE    EKG: None  Radiology: DG Chest 2 View Result Date: 09/01/2023 CLINICAL DATA:  Chest pain. EXAM: CHEST - 2 VIEW COMPARISON:  May 08, 2023 FINDINGS: The cardiac silhouette is mildly enlarged and unchanged in size. There is marked severity calcification of the thoracic aorta. Diffusely increased interstitial lung markings are seen. Mild areas of atelectasis and/or infiltrate are present within the bilateral lung bases. No pleural effusion or pneumothorax is identified. Radiopaque surgical clips are seen overlying the right upper quadrant. The visualized skeletal structures are unremarkable. IMPRESSION: 1. Stable cardiomegaly mild to moderate severity interstitial pulmonary edema. 2. Mild bibasilar atelectasis and/or infiltrate. Electronically Signed   By: Suzen Dials M.D.   On: 09/01/2023 21:49     Procedures   Medications Ordered in the ED - No data to display    {Click here for ABCD2, HEART and other calculators REFRESH Note before signing:1}                              Medical Decision Making  This patient is a ***  who presents to the ED for concern of ***.   Differential diagnoses prior to evaluation: The emergent differential diagnosis includes, but is not limited to, ACS, acute decompensated heart failure, unstable angina,  GERD, musculoskeletal wall strain/costochondritis, severe aortic stenosis, pneumonia, aortic dissection, PE, Boerhaave's. This is not an exhaustive differential.   Past Medical History / Co-morbidities / Social History: NSTEMI, combined systolic/diastolic CHF, PAF, HTN, hypothyroidism, rheumatoid arthritis, HLD, mitral valve regurgitation, severely dilated left atrium, mild aortic valve stenosis  Additional history: Chart reviewed. Pertinent results include:   Last seen by cardiology on 08/27/2023.  Stable at that time.  Interested in presenting palliative care.  Continue with aspirin , losartan , carvedilol , Imdur , Lasix , Crestor .  Noted to have a EF of 20 to 25% on last hospitalization  Last hospitalization was 05/2023 for chronic HFrEF and atrial fibrillation with RVR.  Started on amiodarone .  Declined anticoagulation.    Lab Tests/Imaging studies: I personally interpreted labs/imaging and the pertinent results include:    CBC unremarkable BMP shows a decrease CO2 of 20, increased creatinine of 1.05 and decreased GFR 48.Which is notable different from 3 months ago which had normal creatinine and GFR Troponin 47  Chest x-ray shows stable cardiomegaly with mild to moderate severity interstitial pulmonary edema as well as mild bibasilar atelectasis/and/or infiltrate   ***I agree with the radiologist interpretation.  Cardiac monitoring: EKG obtained and interpreted by myself and attending physician which shows: ***   Medications: I ordered medication including ***.  I have reviewed the patients home medicines and have made adjustments as needed.  Critical Interventions:  Social Determinants of Health:  Disposition: After consideration of the diagnostic results and the patients response to treatment, I feel that the patient would benefit from ***.   ***emergency department workup does not suggest an emergent condition requiring admission or immediate intervention beyond what has been  performed at this time. The plan is: ***. The patient is safe for discharge and has been instructed to return immediately for worsening symptoms, change in symptoms or any other concerns.   {Document critical care time when appropriate  Document review of labs and clinical decision tools ie [id number], etc  Document your independent review of radiology images and any outside records  Document your discussion with family members, caretakers and with consultants  Document social determinants of  health affecting pt's care  Document your decision making why or why not admission, treatments were needed:32947:::1}   Final diagnoses:  None    ED Discharge Orders     None

## 2023-09-01 NOTE — ED Notes (Signed)
 Patient back from CT.

## 2023-09-01 NOTE — ED Provider Notes (Incomplete)
 [place] EMERGENCY DEPARTMENT AT [facility] Provider Note   CSN: [id number] Arrival date & time: 09/01/23  2059     Patient presents with: Chest Pain   Vicki [name] is a 89 y.o. female.    Chest Pain Patient is a 89 year old female was in the ED today with concerns for chest pain that began approximately 4 hours ago while she was having a bowel movement, accompanied by her daughter-in-law and granddaughter.  Medical history of NSTEMI, combined systolic/diastolic CHF, PAF, HTN, hypothyroidism, rheumatoid arthritis, HLD, mitral valve regurgitation, severely dilated left atrium, mild aortic valve stenosis.  Last hospitalization was in March 2025, recently saw cardiologist earlier this past week.  Describes the pain as pressure as well as burning.  States that the episode was severe while she was straining during the bowel movement.  However currently is feeling significantly better and is no longer experiencing symptoms.  States that she does have some lower leg swelling but that it is significantly improved.  States that she is also concerned about a small boil left anterior leg near wound, with family stating that the wound from a skin tear that appears to be well-healing and that she has recently finished up her Keflex which was for a possible cellulitis around the wound site.  Notes that it is mildly tender to touch.  Denies fever, headache, vision changes, cough, hematemesis, hemoptysis, shortness of breath, abdominal pain, nausea, vomiting, hematochezia, melena   Provided aspirin  by EMS.  Prior to Admission medications   Medication Sig Start Date End Date Taking? Authorizing Provider  amiodarone  (PACERONE ) 200 MG tablet Take 0.5 tablets (100 mg total) by mouth every other day. Starting on June 16,2025 07/16/23   Anner Alm ORN, MD  aspirin  EC 81 MG tablet Take 81 mg by mouth daily. Swallow whole.    [provider]  Biotin 10 MG CAPS Take 10 mg by  mouth daily.    [provider]  Calcium  Carb-Cholecalciferol (CALCIUM  600/VITAMIN D PO) Take 1 tablet by mouth daily.    [provider]  Carboxymethylcellul-Glycerin (CLEAR EYES FOR DRY EYES OP) Place 2 drops into both eyes daily as needed (Dry eyes).    [provider]  clotrimazole-betamethasone (LOTRISONE) cream Apply 1 Application topically 2 (two) times daily as needed (irritation). 11/18/20 11/05/23  [provider]  feeding supplement (ENSURE ENLIVE / ENSURE PLUS) LIQD Take 237 mLs by mouth 2 (two) times daily between meals. Patient taking differently: Take 237 mLs by mouth 2 (two) times daily between meals. Prefers chocolate Ensure. No Vanilla 02/09/23   Briana Elgin LABOR, MD  furosemide  (LASIX ) 20 MG tablet Take 20 mg by mouth daily. 09/13/20   [provider]  isosorbide  mononitrate (IMDUR ) 30 MG 24 hr tablet Take 30 mg by mouth daily. 04/28/23   [provider]  levothyroxine  (SYNTHROID ) 50 MCG tablet Take 50 mcg by mouth daily. 09/05/13   [provider]  losartan  (COZAAR ) 25 MG tablet Take 1 tablet (25 mg total) by mouth daily. 07/16/23 10/14/23  Anner Alm ORN, MD  melatonin 3 MG TABS tablet Take 6 mg by mouth at bedtime.    [provider]  Multiple Vitamins-Minerals (THERA-M) TABS Take by mouth.    [provider]  Omega-3 Fatty Acids (FISH OIL) 1000 MG CAPS Take 1,000 mg by mouth daily.    [provider]  omeprazole (PRILOSEC) 20 MG capsule Take 20 mg by mouth daily.    [provider]  Allergies: Codeine, Latex, Penicillins, Sulfa antibiotics, Thiopental, and Statins    Review of Systems  Cardiovascular:  Positive for chest pain.    Updated Vital Signs BP (!) 144/86 (BP Location: Right Arm)   Pulse 98   Temp 98.3 F (36.8 C) (Oral)   Resp 18   Ht 5' 2 (1.575 m)   Wt 63.5 kg   SpO2 97%   BMI 25.61 kg/m   Physical Exam Vitals and nursing note reviewed.  Constitutional:       General: She is not in acute distress.    Appearance: Normal appearance. She is not ill-appearing or diaphoretic.  HENT:     Head: Normocephalic and atraumatic.  Eyes:     General:        Right eye: No discharge.        Left eye: No discharge.     Extraocular Movements: Extraocular movements intact.     Conjunctiva/sclera: Conjunctivae normal.  Neck:     Vascular: No hepatojugular reflux or JVD.  Cardiovascular:     Rate and Rhythm: Normal rate and regular rhythm.     Pulses: Normal pulses.     Heart sounds: Normal heart sounds. No murmur heard.    No friction rub. No gallop.  Pulmonary:     Effort: Pulmonary effort is normal. No tachypnea, accessory muscle usage or respiratory distress.     Breath sounds: Normal breath sounds. No stridor. No decreased breath sounds, wheezing, rhonchi or rales.  Chest:     Chest wall: No tenderness.  Abdominal:     General: Abdomen is flat. There is no distension.     Palpations: Abdomen is soft. There is no mass.     Tenderness: There is abdominal tenderness (Noted to have some generalized abdominal tenderness to palpation with no localization.). There is no right CVA tenderness, left CVA tenderness, guarding or rebound.  Musculoskeletal:     Cervical back: Normal range of motion. No rigidity.     Right lower leg: No tenderness. Edema present.     Left lower leg: No tenderness. Edema present.  Skin:    General: Skin is warm and dry.     Findings: Erythema (Mildly erythematous around wound on left anterior leg, noting small area of fluctuance approximately 1.5 x 1.5 cm.  Near wound which appears to be well-healing.) present. No ecchymosis.  Neurological:     General: No focal deficit present.     Mental Status: She is alert and oriented to person, place, and time. Mental status is at baseline.     Cranial Nerves: No cranial nerve deficit.     Motor: No weakness.  Psychiatric:        Mood and Affect: Mood normal. Mood is not anxious.      (all labs ordered are listed, but only abnormal results are displayed) Labs Reviewed  BASIC METABOLIC PANEL WITH GFR - Abnormal; Notable for the following components:      Result Value   Sodium 134 (*)    CO2 20 (*)    Glucose, Bld 168 (*)    Creatinine, Ser 1.05 (*)    GFR, Estimated 48 (*)    All other components within normal limits  CBC - Abnormal; Notable for the following components:   RDW 16.9 (*)    All other components within normal limits  TROPONIN I (HIGH SENSITIVITY) - Abnormal; Notable for the following components:   Troponin I (High Sensitivity) 47 (*)    All other components  within normal limits  BRAIN NATRIURETIC PEPTIDE    EKG: None  Radiology: DG Chest 2 View Result Date: 09/01/2023 CLINICAL DATA:  Chest pain. EXAM: CHEST - 2 VIEW COMPARISON:  May 08, 2023 FINDINGS: The cardiac silhouette is mildly enlarged and unchanged in size. There is marked severity calcification of the thoracic aorta. Diffusely increased interstitial lung markings are seen. Mild areas of atelectasis and/or infiltrate are present within the bilateral lung bases. No pleural effusion or pneumothorax is identified. Radiopaque surgical clips are seen overlying the right upper quadrant. The visualized skeletal structures are unremarkable. IMPRESSION: 1. Stable cardiomegaly mild to moderate severity interstitial pulmonary edema. 2. Mild bibasilar atelectasis and/or infiltrate. Electronically Signed   By: Suzen Dials M.D.   On: 09/01/2023 21:49     Procedures   Medications Ordered in the ED - No data to display    {Click here for ABCD2, HEART and other calculators REFRESH Note before signing:1}                              Medical Decision Making Amount and/or Complexity of Data Reviewed Labs: ordered. Radiology: ordered.  Risk Prescription drug management.   This patient is a ***  who presents to the ED for concern of ***.   Differential diagnoses prior to evaluation: The  emergent differential diagnosis includes, but is not limited to, ACS, acute decompensated heart failure, unstable angina, GERD, musculoskeletal wall strain/costochondritis, severe aortic stenosis, pneumonia, aortic dissection, PE, Boerhaave's. This is not an exhaustive differential.   Past Medical History / Co-morbidities / Social History: NSTEMI, combined systolic/diastolic CHF, PAF, HTN, hypothyroidism, rheumatoid arthritis, HLD, mitral valve regurgitation, severely dilated left atrium, mild aortic valve stenosis  Additional history: Chart reviewed. Pertinent results include:   Last seen by cardiology on 08/27/2023.  Stable at that time.  Interested in presenting palliative care.  Continue with aspirin , losartan , carvedilol , Imdur , Lasix , Crestor .  Noted to have a EF of 20 to 25% on last hospitalization  Last hospitalization was 05/2023 for chronic HFrEF and atrial fibrillation with RVR.  Started on amiodarone .  Declined anticoagulation.    Lab Tests/Imaging studies: I personally interpreted labs/imaging and the pertinent results include:    CBC unremarkable BMP shows a decrease CO2 of 20, increased creatinine of 1.05 and decreased GFR 48.Which is notable different from 3 months ago which had normal creatinine and GFR Troponin 47  Chest x-ray shows stable cardiomegaly with mild to moderate severity interstitial pulmonary edema as well as mild bibasilar atelectasis/and/or infiltrate   ***I agree with the radiologist interpretation.  Cardiac monitoring: EKG obtained and interpreted by myself and attending physician which shows: ***   Medications: I ordered medication including ***.  I have reviewed the patients home medicines and have made adjustments as needed.  Critical Interventions:  Social Determinants of Health:  Disposition: After consideration of the diagnostic results and the patients response to treatment, I feel that the patient would benefit from ***.   ***emergency  department workup does not suggest an emergent condition requiring admission or immediate intervention beyond what has been performed at this time. The plan is: ***. The patient is safe for discharge and has been instructed to return immediately for worsening symptoms, change in symptoms or any other concerns.   {Document critical care time when appropriate  Document review of labs and clinical decision tools ie CHADS2VASC2, etc  Document your independent review of radiology images and any outside records  Document your discussion with family members, caretakers and with consultants  Document social determinants of health affecting pt's care  Document your decision making why or why not admission, treatments were needed:32947:::1}   Final diagnoses:  None    ED Discharge Orders     None

## 2023-09-01 NOTE — ED Triage Notes (Signed)
 Patient arrives via [facility] from home for centralized chest pain that radiates to back. Patient reports chest pain started after straining on toilet - reports similar episode on Sunday. Initial pain was 8/10 but has gone down to 5/10 en route. Patient reports feeling sensation of having to burp. EMS reports patient does have pacemaker but family states that the company says they have lost signal with pacemaker. 20G RAC and 324mg  aspirin  given PTA.

## 2023-09-01 NOTE — ED Notes (Signed)
 Lab called to add on BNP

## 2023-09-01 NOTE — ED Notes (Signed)
 ED Provider at bedside.

## 2023-09-01 NOTE — ED Notes (Signed)
 Family at bedside.

## 2023-09-01 NOTE — ED Notes (Signed)
 Patient transported to CT

## 2023-09-02 ENCOUNTER — Encounter (HOSPITAL_COMMUNITY): Payer: Self-pay | Admitting: Internal Medicine

## 2023-09-02 ENCOUNTER — Observation Stay (HOSPITAL_COMMUNITY)

## 2023-09-02 DIAGNOSIS — I252 Old myocardial infarction: Secondary | ICD-10-CM | POA: Diagnosis not present

## 2023-09-02 DIAGNOSIS — I5084 End stage heart failure: Secondary | ICD-10-CM | POA: Diagnosis present

## 2023-09-02 DIAGNOSIS — I480 Paroxysmal atrial fibrillation: Secondary | ICD-10-CM | POA: Diagnosis present

## 2023-09-02 DIAGNOSIS — M069 Rheumatoid arthritis, unspecified: Secondary | ICD-10-CM | POA: Diagnosis present

## 2023-09-02 DIAGNOSIS — Z885 Allergy status to narcotic agent status: Secondary | ICD-10-CM | POA: Diagnosis not present

## 2023-09-02 DIAGNOSIS — E039 Hypothyroidism, unspecified: Secondary | ICD-10-CM | POA: Diagnosis present

## 2023-09-02 DIAGNOSIS — N182 Chronic kidney disease, stage 2 (mild): Secondary | ICD-10-CM | POA: Diagnosis present

## 2023-09-02 DIAGNOSIS — I493 Ventricular premature depolarization: Secondary | ICD-10-CM | POA: Diagnosis present

## 2023-09-02 DIAGNOSIS — N179 Acute kidney failure, unspecified: Secondary | ICD-10-CM | POA: Insufficient documentation

## 2023-09-02 DIAGNOSIS — Z880 Allergy status to penicillin: Secondary | ICD-10-CM | POA: Diagnosis not present

## 2023-09-02 DIAGNOSIS — Z7189 Other specified counseling: Secondary | ICD-10-CM

## 2023-09-02 DIAGNOSIS — Z882 Allergy status to sulfonamides status: Secondary | ICD-10-CM | POA: Diagnosis not present

## 2023-09-02 DIAGNOSIS — Z515 Encounter for palliative care: Secondary | ICD-10-CM

## 2023-09-02 DIAGNOSIS — I214 Non-ST elevation (NSTEMI) myocardial infarction: Secondary | ICD-10-CM

## 2023-09-02 DIAGNOSIS — Z79890 Hormone replacement therapy (postmenopausal): Secondary | ICD-10-CM | POA: Diagnosis not present

## 2023-09-02 DIAGNOSIS — R54 Age-related physical debility: Secondary | ICD-10-CM | POA: Diagnosis present

## 2023-09-02 DIAGNOSIS — S81802A Unspecified open wound, left lower leg, initial encounter: Secondary | ICD-10-CM | POA: Diagnosis not present

## 2023-09-02 DIAGNOSIS — E785 Hyperlipidemia, unspecified: Secondary | ICD-10-CM | POA: Diagnosis present

## 2023-09-02 DIAGNOSIS — I2510 Atherosclerotic heart disease of native coronary artery without angina pectoris: Secondary | ICD-10-CM | POA: Diagnosis present

## 2023-09-02 DIAGNOSIS — Z66 Do not resuscitate: Secondary | ICD-10-CM | POA: Diagnosis present

## 2023-09-02 DIAGNOSIS — I340 Nonrheumatic mitral (valve) insufficiency: Secondary | ICD-10-CM | POA: Diagnosis present

## 2023-09-02 DIAGNOSIS — Z7982 Long term (current) use of aspirin: Secondary | ICD-10-CM | POA: Diagnosis not present

## 2023-09-02 DIAGNOSIS — Z91040 Latex allergy status: Secondary | ICD-10-CM | POA: Diagnosis not present

## 2023-09-02 DIAGNOSIS — I130 Hypertensive heart and chronic kidney disease with heart failure and stage 1 through stage 4 chronic kidney disease, or unspecified chronic kidney disease: Secondary | ICD-10-CM | POA: Diagnosis present

## 2023-09-02 DIAGNOSIS — I5042 Chronic combined systolic (congestive) and diastolic (congestive) heart failure: Secondary | ICD-10-CM | POA: Diagnosis present

## 2023-09-02 DIAGNOSIS — Z79899 Other long term (current) drug therapy: Secondary | ICD-10-CM | POA: Diagnosis not present

## 2023-09-02 DIAGNOSIS — L03116 Cellulitis of left lower limb: Secondary | ICD-10-CM | POA: Diagnosis present

## 2023-09-02 DIAGNOSIS — I10 Essential (primary) hypertension: Secondary | ICD-10-CM | POA: Diagnosis not present

## 2023-09-02 DIAGNOSIS — I249 Acute ischemic heart disease, unspecified: Secondary | ICD-10-CM | POA: Diagnosis not present

## 2023-09-02 DIAGNOSIS — S81802D Unspecified open wound, left lower leg, subsequent encounter: Secondary | ICD-10-CM | POA: Diagnosis not present

## 2023-09-02 DIAGNOSIS — I509 Heart failure, unspecified: Secondary | ICD-10-CM | POA: Diagnosis not present

## 2023-09-02 LAB — HEPATIC FUNCTION PANEL
ALT: 26 U/L (ref 0–44)
AST: 25 U/L (ref 15–41)
Albumin: 3.4 g/dL — ABNORMAL LOW (ref 3.5–5.0)
Alkaline Phosphatase: 54 U/L (ref 38–126)
Bilirubin, Direct: 0.1 mg/dL (ref 0.0–0.2)
Indirect Bilirubin: 0.5 mg/dL (ref 0.3–0.9)
Total Bilirubin: 0.6 mg/dL (ref 0.0–1.2)
Total Protein: 6.1 g/dL — ABNORMAL LOW (ref 6.5–8.1)

## 2023-09-02 LAB — COMPREHENSIVE METABOLIC PANEL WITH GFR
ALT: 37 U/L (ref 0–44)
AST: 42 U/L — ABNORMAL HIGH (ref 15–41)
Albumin: 3.3 g/dL — ABNORMAL LOW (ref 3.5–5.0)
Alkaline Phosphatase: 57 U/L (ref 38–126)
Anion gap: 12 (ref 5–15)
BUN: 19 mg/dL (ref 8–23)
CO2: 23 mmol/L (ref 22–32)
Calcium: 8.8 mg/dL — ABNORMAL LOW (ref 8.9–10.3)
Chloride: 102 mmol/L (ref 98–111)
Creatinine, Ser: 0.94 mg/dL (ref 0.44–1.00)
GFR, Estimated: 55 mL/min — ABNORMAL LOW (ref >60)
Glucose, Bld: 108 mg/dL — ABNORMAL HIGH (ref 70–99)
Potassium: 3.5 mmol/L (ref 3.5–5.1)
Sodium: 137 mmol/L (ref 135–145)
Total Bilirubin: 0.6 mg/dL (ref 0.0–1.2)
Total Protein: 6.1 g/dL — ABNORMAL LOW (ref 6.5–8.1)

## 2023-09-02 LAB — CBC WITH DIFFERENTIAL/PLATELET
Abs Immature Granulocytes: 0.04 10*3/uL (ref 0.00–0.07)
Basophils Absolute: 0.2 10*3/uL — ABNORMAL HIGH (ref 0.0–0.1)
Basophils Relative: 2 %
Eosinophils Absolute: 0.2 10*3/uL (ref 0.0–0.5)
Eosinophils Relative: 2 %
HCT: 35.8 % — ABNORMAL LOW (ref 36.0–46.0)
Hemoglobin: 11.9 g/dL — ABNORMAL LOW (ref 12.0–15.0)
Immature Granulocytes: 0 %
Lymphocytes Relative: 34 %
Lymphs Abs: 3.1 10*3/uL (ref 0.7–4.0)
MCH: 31 pg (ref 26.0–34.0)
MCHC: 33.2 g/dL (ref 30.0–36.0)
MCV: 93.2 fL (ref 80.0–100.0)
Monocytes Absolute: 1.3 10*3/uL — ABNORMAL HIGH (ref 0.1–1.0)
Monocytes Relative: 14 %
Neutro Abs: 4.3 10*3/uL (ref 1.7–7.7)
Neutrophils Relative %: 48 %
Platelets: 266 10*3/uL (ref 150–400)
RBC: 3.84 MIL/uL — ABNORMAL LOW (ref 3.87–5.11)
RDW: 16.7 % — ABNORMAL HIGH (ref 11.5–15.5)
WBC: 9 10*3/uL (ref 4.0–10.5)
nRBC: 0 % (ref 0.0–0.2)

## 2023-09-02 LAB — URINALYSIS, ROUTINE W REFLEX MICROSCOPIC
Bilirubin Urine: NEGATIVE
Glucose, UA: NEGATIVE mg/dL
Hgb urine dipstick: NEGATIVE
Ketones, ur: NEGATIVE mg/dL
Leukocytes,Ua: NEGATIVE
Nitrite: NEGATIVE
Protein, ur: NEGATIVE mg/dL
Specific Gravity, Urine: 1.024 (ref 1.005–1.030)
pH: 5 (ref 5.0–8.0)

## 2023-09-02 LAB — ECHOCARDIOGRAM LIMITED
Calc EF: 26.2 %
Height: 62 in
S' Lateral: 4.5 cm
Single Plane A2C EF: 25.8 %
Single Plane A4C EF: 26.3 %
Weight: 2240 [oz_av]

## 2023-09-02 LAB — HEPARIN LEVEL (UNFRACTIONATED)
Heparin Unfractionated: 0.25 [IU]/mL — ABNORMAL LOW (ref 0.30–0.70)
Heparin Unfractionated: 0.11 [IU]/mL — ABNORMAL LOW (ref 0.30–0.70)

## 2023-09-02 LAB — LIPASE, BLOOD: Lipase: 32 U/L (ref 11–51)

## 2023-09-02 LAB — TROPONIN I (HIGH SENSITIVITY): Troponin I (High Sensitivity): 178 ng/L (ref <18)

## 2023-09-02 MED ORDER — HEPARIN BOLUS VIA INFUSION
2000.0000 [IU] | Freq: Once | INTRAVENOUS | Status: AC
  Administered 2023-09-02: 2000 [IU] via INTRAVENOUS
  Filled 2023-09-02: qty 2000

## 2023-09-02 MED ORDER — PERFLUTREN LIPID MICROSPHERE
1.0000 mL | INTRAVENOUS | Status: AC | PRN
  Administered 2023-09-02: 5 mL via INTRAVENOUS

## 2023-09-02 MED ORDER — LEVOTHYROXINE SODIUM 50 MCG PO TABS
50.0000 ug | ORAL_TABLET | Freq: Every day | ORAL | Status: DC
  Administered 2023-09-02 – 2023-09-05 (×4): 50 ug via ORAL
  Filled 2023-09-02: qty 1
  Filled 2023-09-02: qty 2
  Filled 2023-09-02 (×2): qty 1

## 2023-09-02 MED ORDER — ASPIRIN 81 MG PO TBEC
81.0000 mg | DELAYED_RELEASE_TABLET | Freq: Every day | ORAL | Status: DC
  Administered 2023-09-03 – 2023-09-05 (×3): 81 mg via ORAL
  Filled 2023-09-02 (×3): qty 1

## 2023-09-02 MED ORDER — ASPIRIN 81 MG PO CHEW
324.0000 mg | CHEWABLE_TABLET | Freq: Once | ORAL | Status: AC
  Administered 2023-09-02: 324 mg via ORAL
  Filled 2023-09-02: qty 4

## 2023-09-02 MED ORDER — CLOTRIMAZOLE-BETAMETHASONE 1-0.05 % EX CREA
1.0000 | TOPICAL_CREAM | Freq: Two times a day (BID) | CUTANEOUS | Status: DC | PRN
  Administered 2023-09-02 (×2): 1 via TOPICAL
  Filled 2023-09-02: qty 15

## 2023-09-02 MED ORDER — PANTOPRAZOLE SODIUM 40 MG PO TBEC
40.0000 mg | DELAYED_RELEASE_TABLET | Freq: Every day | ORAL | Status: DC
  Administered 2023-09-02 – 2023-09-05 (×4): 40 mg via ORAL
  Filled 2023-09-02 (×4): qty 1

## 2023-09-02 MED ORDER — MEDIHONEY WOUND/BURN DRESSING EX PSTE
1.0000 | PASTE | Freq: Every day | CUTANEOUS | Status: DC
  Administered 2023-09-05: 1 via TOPICAL
  Filled 2023-09-02 (×2): qty 44

## 2023-09-02 MED ORDER — HEPARIN (PORCINE) 25000 UT/250ML-% IV SOLN
1300.0000 [IU]/h | INTRAVENOUS | Status: DC
  Administered 2023-09-02: 1000 [IU]/h via INTRAVENOUS
  Administered 2023-09-02: 1100 [IU]/h via INTRAVENOUS
  Administered 2023-09-03: 1300 [IU]/h via INTRAVENOUS
  Filled 2023-09-02 (×3): qty 250

## 2023-09-02 MED ORDER — SODIUM CHLORIDE 0.9 % IV SOLN
1.0000 g | Freq: Once | INTRAVENOUS | Status: AC
  Administered 2023-09-02: 1 g via INTRAVENOUS
  Filled 2023-09-02: qty 10

## 2023-09-02 MED ORDER — ISOSORBIDE MONONITRATE ER 30 MG PO TB24
30.0000 mg | ORAL_TABLET | Freq: Every day | ORAL | Status: DC
  Administered 2023-09-02 – 2023-09-05 (×4): 30 mg via ORAL
  Filled 2023-09-02 (×4): qty 1

## 2023-09-02 MED ORDER — MELATONIN 5 MG PO TABS
5.0000 mg | ORAL_TABLET | Freq: Every evening | ORAL | Status: DC | PRN
  Administered 2023-09-02 – 2023-09-04 (×3): 5 mg via ORAL
  Filled 2023-09-02 (×3): qty 1

## 2023-09-02 MED ORDER — AMIODARONE HCL 200 MG PO TABS
100.0000 mg | ORAL_TABLET | ORAL | Status: DC
  Administered 2023-09-03 – 2023-09-05 (×2): 100 mg via ORAL
  Filled 2023-09-02 (×2): qty 1

## 2023-09-02 MED ORDER — FUROSEMIDE 10 MG/ML IJ SOLN
40.0000 mg | Freq: Once | INTRAMUSCULAR | Status: AC
  Administered 2023-09-02: 40 mg via INTRAVENOUS
  Filled 2023-09-02: qty 4

## 2023-09-02 MED ORDER — SODIUM CHLORIDE 0.9 % IV SOLN
1.0000 g | INTRAVENOUS | Status: DC
  Administered 2023-09-02 – 2023-09-04 (×3): 1 g via INTRAVENOUS
  Filled 2023-09-02 (×3): qty 10

## 2023-09-02 NOTE — Progress Notes (Signed)
 PHARMACY - ANTICOAGULATION CONSULT NOTE  Pharmacy Consult for heparin  Indication: chest pain/ACS  Allergies  Allergen Reactions   Codeine Nausea Only    Makes her crazy Hallucinations    Latex Itching   Penicillins Rash    YEAST INFECTION  YEAST INFECTION   Sulfa Antibiotics Anaphylaxis and Other (See Comments)    Heart stops   Thiopental Anaphylaxis and Rash    Other reaction(s): Unknown  CARDIAC ARREST  CARDIAC ARREST   Statins Itching    Itchy scalp with hair loss, confusion    Patient Measurements: Height: 5' 2 (157.5 cm) Weight: 63.5 kg (140 lb) IBW/kg (Calculated) : 50.1 HEPARIN  DW (KG): 62.9  Vital Signs: Temp: 97.7 F (36.5 C) (07/03 0716) Temp Source: Oral (07/03 0716) BP: 106/54 (07/03 1039) Pulse Rate: 77 (07/03 1039)  Labs: Recent Labs    09/01/23 2110 09/01/23 2259 09/02/23 0434 09/02/23 0848  HGB 12.2  --  11.9*  --   HCT 37.6  --  35.8*  --   PLT 278  --  266  --   HEPARINUNFRC  --   --   --  0.25*  CREATININE 1.05*  --  0.94  --   TROPONINIHS 47* 178*  --   --     Estimated Creatinine Clearance: 30 mL/min (by C-G formula based on SCr of 0.94 mg/dL).   Medical History: Past Medical History:  Diagnosis Date   Hypertension    Hypothyroidism     Assessment: Vicki [name] is a 89 y.o. year old female admitted on 09/01/2023 with concern for NSTEMI. No heart cath, will plan for medical management at this time. No anticoagulation prior to admission. Of note pt was dx'd with Afib earlier this year but declined anticoagulation. Pharmacy consulted to dose heparin .  Heparin  level 0.25, slightly subtherapeutic No issues with infusion or bleeding noted by RN.   Goal of Therapy:  Heparin  level 0.3-0.7 units/ml Monitor platelets by anticoagulation protocol: Yes   Plan:  Increase heparin  infusion at 1100 units/hr  8h heparin  level  Daily heparin  level, CBC, and monitoring for bleeding F/u plans for anticoagulation   Thank you for allowing  pharmacy to participate in this patient's care.  Leonor GORMAN Bash, PharmD Emergency Medicine Clinical Pharmacist 09/02/2023,12:43 PM

## 2023-09-02 NOTE — ED Notes (Signed)
 Cardiology at bedside.

## 2023-09-02 NOTE — Progress Notes (Signed)
 Heart Failure Navigator Progress Note  Assessed for Heart & Vascular TOC clinic readiness.  Patient does not meet criteria due to last EF 25-30%, patient declined any invasive procedures, has a Palliative consult in. A scheduled [facility] appointment for 11/23/2023. No HF TOC. .   Navigator available for reassessment of patient.   Stephane Haddock, BSN, Scientist, clinical (histocompatibility and immunogenetics) Only

## 2023-09-02 NOTE — Consult Note (Signed)
 Palliative Care Consult Note                                  Date: 09/02/2023   Patient Name: Vicki [name]  DOB: February 16, 1924  MRN: [id number]  Age / Sex: 89 y.o., female  PCP: Vicki Joen HERO, MD Referring Physician: Davia Nydia POUR, MD  Reason for Consultation: Establishing goals of care  HPI/Patient Profile: 89 y.o. female  with past medical history of HFrEF (last echo in [place] with EF 20-25%), prior NSTEMI in December 2024, CKD stage 2, hypothyroidism, and HTN who presented to the ED on 09/01/2023 with chest pain. Found to have elevated troponin, elevated BNP. Also with left leg wound from recent scratch on the furniture.  She is admitted with NSTEMI.   Palliative Medicine has been consulted for goals of care discussions. Patient and family are faced with anticipatory care needs and complex medical decision making.   Clinical Assessment and Goals of Care:   Extensive chart review has been completed including labs, vital signs, imaging, progress/consult notes, orders, medications and available advance directive documents.    I met with patient to discuss diagnosis, prognosis, GOC, and disposition. She denies chest pain and tells me she feels fine. She is on a heparin  drip.   Patient is known to PMT from her hospitalization in March 2024. I re-introduced Palliative Medicine as specialized medical care for people living with serious illness. It focuses on providing relief from the symptoms and stress of a serious illness.   Created space and opportunity for patient to express thoughts and feelings regarding current medical situation. Values and goals of care were attempted to be elicited.  Social History: Patient has 2 sons, 1 who lives in [place]  and 1 who lives locally.  She has 4 grandchildren, several great-grandchildren, and even 2 great-great-grandchildren. She highly values her family and her faith. She enjoys reading  her Bible daily.   Functional Status: Patient lives at home alone. She is ambulatory and independent with her basic ADLs. She has private duty caregivers for several hours each day, to help with housework.  She leans heavily on her daughter-in-law Vicki [name].   Discussion: We discussed patient's current illness and what it means in the larger context of her ongoing co-morbidities. Current clinical status was reviewed. Natural disease trajectory of chronic illness was discussed.  We discussed her medical issues, including coronary artery disease now admitted with NSTEMI as well as CHF. I explained that her heart is very weak.   Patient tells me that she is ready when God decides to take her. Until then, she plans to take it one day at a time. Her stated goal is to be as independent as she can, for as long as she can.   A discussion was had today regarding advanced directives.  Patient states she would want her son/Vicki [name] and daughter-in-law/Vicki [name] to make medical decisions on her behalf if she was unable to speak for herself.   We discussed scopes of care, specifically the difference between limited interventions versus comfort care.  We discussed the limitations of medical interventions to prolong quality of life when the body starts to fail. We discussed hospice services; she is receptive to the concept of hospice but does not feel she needs it yet.   Patient confirms DNR/DNI status. She confirms she declines cardiac cath. She states she is ok with continued medical management, including heparin  drip.  Discussed the importance of continued conversation with the medical team regarding overall plan of care and treatment options, ensuring decisions are within the context of the patients values and GOCs.    Review of Systems  Cardiovascular:  Negative for chest pain.    Objective:   Primary Diagnoses: Present on Admission:  Non-STEMI (non-ST elevated myocardial infarction) (HCC)  Chronic  combined systolic and diastolic CHF (congestive heart failure) (HCC)  Essential hypertension  Paroxysmal atrial fibrillation (HCC)  Hypothyroidism  Non-ST elevated myocardial infarction (non-STEMI) (HCC)  NSTEMI (non-ST elevated myocardial infarction) [facility])   Physical Exam Vitals reviewed.  Constitutional:      General: She is not in acute distress.    Comments: Frail, chronically ill-appearing  Cardiovascular:     Rate and Rhythm: Normal rate.  Pulmonary:     Effort: Pulmonary effort is normal.  Skin:    General: Skin is warm and dry.  Neurological:     Mental Status: She is alert and oriented to person, place, and time.  Psychiatric:     Comments: pleasant     Palliative Assessment/Data: PPS 50%     Assessment & Plan:   SUMMARY OF RECOMMENDATIONS   Continue current supportive interventions Patient's goal is to return home when medically optimized Patient hopes to remain as independent as possible for as long as possible PMT will continue to follow  Primary Decision Maker: PATIENT  Existing Vynca/ACP Documentation: None  Code Status/Advance Care Planning: DNR - Limited  Symptom Management:  Per attending  Prognosis:  Unable to determine  Discharge Planning:  To Be Determined    Thank you for allowing us  to participate in the care of RAINIE CRENSHAW   Time Total: 76 minutes  Detailed review of medical records (labs, imaging, vital signs), medically appropriate exam, discussed with treatment team, counseling and education to patient, family, & staff, documenting clinical information, coordination of care.;  Signed by: Recardo Loll, NP Palliative Medicine Team  Team Phone # [phone]  For individual providers, please see AMION

## 2023-09-02 NOTE — H&P (Addendum)
 History and Physical    MARYANNA STUBER FMW:[id number] DOB: [birth year] DOA: 09/01/2023   Chief Complaint: Chest pain.  HPI: Vicki [name] is a 89 y.o. female with past medical history of chronic HFrEF last 2D echo done in March 2025 showed EF of 20 to 25% with prior history of non-ST elevation MI in December 2024 when patient declined cardiac cath, chronic kidney disease stage III, hypothyroidism, hypertension was brought to the ER after patient started developing chest pain.  Patient started having retrosternal chest pain radiating to the back while on the commode.  Patient had a similar episode about 3 days ago which resolved without any intervention.  Patient denies any shortness of breath productive cough fever chills.  Has had wound on the left leg from recent scratch on the furniture.  ED Course: In the ER patient had CT angiogram of chest abdomen pelvis which shows signs of possible pulmonary edema.  Also shows features of possible diverticulitis though patient denies any abdominal pain and abdomen is nontender.  BNP is 820 and troponin was 47 and 178.  EKG shows normal sinus rhythm.  Unifocal PVCs.  On-call cardiologist was consulted given the concern for non-ST elevation MI.  Patient was started on heparin  infusion.  40 mg IV Lasix  was given once.  Rocephin was started for left leg wound.  Review of Systems: As per HPI, rest all negative.   Past Medical History:  Diagnosis Date   Hypertension    Hypothyroidism     History reviewed. No pertinent surgical history.   reports that she has never smoked. She has never used smokeless tobacco. She reports that she does not drink alcohol and does not use drugs.  Allergies  Allergen Reactions   Codeine Nausea Only    Makes her crazy Hallucinations    Latex Itching   Penicillins Rash    YEAST INFECTION  YEAST INFECTION   Sulfa Antibiotics Anaphylaxis and Other (See Comments)    Heart stops   Thiopental Anaphylaxis and Rash     Other reaction(s): Unknown  CARDIAC ARREST  CARDIAC ARREST   Statins Itching    Itchy scalp with hair loss, confusion    History reviewed. No pertinent family history.  Prior to Admission medications   Medication Sig Start Date End Date Taking? Authorizing Provider  amiodarone  (PACERONE ) 200 MG tablet Take 0.5 tablets (100 mg total) by mouth every other day. Starting on June 16,2025 07/16/23  Yes Anner Alm ORN, MD  aspirin  EC 81 MG tablet Take 81 mg by mouth daily. Swallow whole.   Yes [provider]  Biotin 10 MG CAPS Take 10 mg by mouth daily.   Yes [provider]  Calcium  Carb-Cholecalciferol (CALCIUM  600/VITAMIN D PO) Take 1 tablet by mouth daily.   Yes [provider]  Carboxymethylcellul-Glycerin (CLEAR EYES FOR DRY EYES OP) Place 2 drops into both eyes daily as needed (Dry eyes).   Yes [provider]  clotrimazole-betamethasone (LOTRISONE) cream Apply 1 Application topically 2 (two) times daily as needed (irritation). 11/18/20 11/05/23 Yes [provider]  feeding supplement (ENSURE ENLIVE / ENSURE PLUS) LIQD Take 237 mLs by mouth 2 (two) times daily between meals. Patient taking differently: Take 237 mLs by mouth daily at 12 noon. Prefers chocolate Ensure. No Vanilla 02/09/23  Yes Briana Elgin LABOR, MD  furosemide  (LASIX ) 20 MG tablet Take 20 mg by mouth daily. 09/13/20  Yes [provider]  isosorbide  mononitrate (IMDUR ) 30 MG 24 hr tablet Take 30  mg by mouth daily. 04/28/23  Yes [provider]  levothyroxine  (SYNTHROID ) 50 MCG tablet Take 50 mcg by mouth daily. 09/05/13  Yes [provider]  losartan  (COZAAR ) 25 MG tablet Take 1 tablet (25 mg total) by mouth daily. 07/16/23 10/14/23 Yes Anner Alm ORN, MD  Multiple Vitamins-Minerals (THERA-M) TABS Take 1 tablet by mouth daily.   Yes [provider]  Omega-3 Fatty Acids (FISH OIL) 1000 MG CAPS Take 1,000 mg by mouth daily.   Yes [provider]   omeprazole (PRILOSEC) 20 MG capsule Take 20 mg by mouth daily.   Yes [provider]  melatonin 3 MG TABS tablet Take 6 mg by mouth at bedtime.    [provider]    Physical Exam: Constitutional: Moderately built and nourished. Vitals:   09/02/23 0059 09/02/23 0215 09/02/23 0230 09/02/23 0315  BP:   114/61   Pulse:  86 85 83  Resp:  20 17 18   Temp: 98.1 F (36.7 C)     TempSrc: Oral     SpO2:  99% 98% 95%  Weight:      Height:       Eyes: Anicteric no pallor. ENMT: No discharge from the ears/nose or mouth. Neck: No mass felt.  No neck rigidity. Respiratory: No rhonchi or crepitations. Cardiovascular: S1-S2 heard. Abdomen: Soft nontender bowel sound present. Musculoskeletal: No edema. Skin: Wound on the left anterior shin. Neurologic: Awake oriented time place and person.  Moves all extremities. Psychiatric: Appears normal.  Normal affect.   Labs on Admission: I have personally reviewed following labs and imaging studies  CBC: Recent Labs  Lab 09/01/23 2110  WBC 9.3  HGB 12.2  HCT 37.6  MCV 93.1  PLT 278   Basic Metabolic Panel: Recent Labs  Lab 09/01/23 2110  NA 134*  K 4.1  CL 103  CO2 20*  GLUCOSE 168*  BUN 21  CREATININE 1.05*  CALCIUM  9.2   GFR: Estimated Creatinine Clearance: 26.8 mL/min (A) (by C-G formula based on SCr of 1.05 mg/dL (H)). Liver Function Tests: Recent Labs  Lab 09/01/23 2259  AST 25  ALT 26  ALKPHOS 54  BILITOT 0.6  PROT 6.1*  ALBUMIN 3.4*   Recent Labs  Lab 09/01/23 2259  LIPASE 32   No results for input(s): AMMONIA in the last 168 hours. Coagulation Profile: No results for input(s): INR, PROTIME in the last 168 hours. Cardiac Enzymes: No results for input(s): CKTOTAL, CKMB, CKMBINDEX, TROPONINI in the last 168 hours. BNP (last 3 results) No results for input(s): PROBNP in the last 8760 hours. HbA1C: No results for input(s): HGBA1C in the last 72 hours. CBG: No results for  input(s): GLUCAP in the last 168 hours. Lipid Profile: No results for input(s): CHOL, HDL, LDLCALC, TRIG, CHOLHDL, LDLDIRECT in the last 72 hours. Thyroid  Function Tests: No results for input(s): TSH, T4TOTAL, FREET4, T3FREE, THYROIDAB in the last 72 hours. Anemia Panel: No results for input(s): VITAMINB12, FOLATE, FERRITIN, TIBC, IRON, RETICCTPCT in the last 72 hours. Urine analysis:    Component Value Date/Time   COLORURINE YELLOW 09/02/2023 0039   APPEARANCEUR CLEAR 09/02/2023 0039   LABSPEC 1.024 09/02/2023 0039   PHURINE 5.0 09/02/2023 0039   GLUCOSEU NEGATIVE 09/02/2023 0039   HGBUR NEGATIVE 09/02/2023 0039   BILIRUBINUR NEGATIVE 09/02/2023 0039   KETONESUR NEGATIVE 09/02/2023 0039   PROTEINUR NEGATIVE 09/02/2023 0039   NITRITE NEGATIVE 09/02/2023 0039   LEUKOCYTESUR NEGATIVE 09/02/2023 0039   Sepsis Labs: @LABRCNTIP (procalcitonin:4,lacticidven:4) )No results found  for this or any previous visit (from the past 240 hours).   Radiological Exams on Admission: CT Angio Chest/Abd/Pel for Dissection W and/or W/WO Result Date: 09/02/2023 CLINICAL DATA:  Chest pain radiating to the back. Aortic aneurysm suspected. EXAM: CT ANGIOGRAPHY CHEST, ABDOMEN AND PELVIS TECHNIQUE: Non-contrast CT of the chest was initially obtained. Multidetector CT imaging through the chest, abdomen and pelvis was performed using the standard protocol during bolus administration of intravenous contrast. Multiplanar reconstructed images and MIPs were obtained and reviewed to evaluate the vascular anatomy. RADIATION DOSE REDUCTION: This exam was performed according to the departmental dose-optimization program which includes automated exposure control, adjustment of the mA and/or kV according to patient size and/or use of iterative reconstruction technique. CONTRAST:  75mL OMNIPAQUE  IOHEXOL  350 MG/ML SOLN COMPARISON:  None Available. FINDINGS: CTA CHEST FINDINGS Cardiovascular:  Cardiomegaly. Coronary artery and aortic atherosclerotic calcification. No acute aortic syndrome. No pericardial effusion. Mediastinum/Nodes: Trachea and esophagus are unremarkable. No thoracic adenopathy. She is Lungs/Pleura: Scarring in the lower lobes. Lower lung interlobular septal thickening compatible with edema. No focal pneumonia. No pleural effusion or pneumothorax. Musculoskeletal: No acute fracture. Sclerotic lesion in the T6 vertebral body is favored benign. Review of the MIP images confirms the above findings. CTA ABDOMEN AND PELVIS FINDINGS VASCULAR Aortic atherosclerotic calcification without hemodynamically significant stenosis. No aneurysm or dissection. Calcified atherosclerosis at the origins of the mesenteric, renal, and throughout the iliac arteries. No aneurysm or dissection. Review of the MIP images confirms the above findings. NON-VASCULAR Hepatobiliary: No acute abnormality.  Cholecystectomy. Pancreas: Unremarkable. Spleen: Unremarkable. Adrenals/Urinary Tract: Normal adrenal glands. No urinary calculi or hydronephrosis. Unremarkable bladder were not obscured by streak artifact. Stomach/Bowel: Normal caliber large and small bowel. Extensive colonic diverticulosis greatest about the sigmoid colon. There is mild wall thickening of the sigmoid colon which may be due to hypertrophy or mild diverticulitis. Stomach is within normal limits. Lymphatic: No lymphadenopathy. Reproductive: Hysterectomy. Other: No free intraperitoneal fluid or air. Musculoskeletal: No acute fracture. Demineralization. Right THA. Postoperative changes about the lumbar spine. Review of the MIP images confirms the above findings. IMPRESSION: 1. No acute aortic syndrome. 2. Cardiomegaly with mild interstitial pulmonary edema. 3. Extensive colonic diverticulosis greatest about the sigmoid colon. There is mild wall thickening of the sigmoid colon which may be due to smooth muscle hypertrophy or mild diverticulitis. 4. Aortic  Atherosclerosis (ICD10-I70.0). Electronically Signed   By: Norman Gatlin M.D.   On: 09/02/2023 00:23   DG Chest 2 View Result Date: 09/01/2023 CLINICAL DATA:  Chest pain. EXAM: CHEST - 2 VIEW COMPARISON:  May 08, 2023 FINDINGS: The cardiac silhouette is mildly enlarged and unchanged in size. There is marked severity calcification of the thoracic aorta. Diffusely increased interstitial lung markings are seen. Mild areas of atelectasis and/or infiltrate are present within the bilateral lung bases. No pleural effusion or pneumothorax is identified. Radiopaque surgical clips are seen overlying the right upper quadrant. The visualized skeletal structures are unremarkable. IMPRESSION: 1. Stable cardiomegaly mild to moderate severity interstitial pulmonary edema. 2. Mild bibasilar atelectasis and/or infiltrate. Electronically Signed   By: Suzen Dials M.D.   On: 09/01/2023 21:49    EKG: Independently reviewed.  Sinus rhythm with unifocal PVCs.  Assessment/Plan Principal Problem:   Non-STEMI (non-ST elevated myocardial infarction) (HCC) Active Problems:   Chronic combined systolic and diastolic CHF (congestive heart failure) (HCC)   Essential hypertension   Hypothyroidism   Paroxysmal atrial fibrillation (HCC)   ARF (acute renal failure) (HCC)   Non-ST elevated myocardial  infarction (non-STEMI) (HCC)    Non-ST elevation MI -    appreciate cardiology consult.  Patient is placed on heparin  infusion and aspirin .  Continue amiodarone .  Used to be on carvedilol  not sure if patient is still taking it.  N.p.o. in anticipation of cardiac cath. Acute on chronic HFrEF last EF measured was 20 to 25% in March 2025.  1 dose of Lasix  40 mg was given.  Hold ARB for now in anticipation of cardiac cath. Left leg wound on ceftriaxone. Paroxysmal atrial fibrillation on amiodarone .  On heparin  infusion now holding Eliquis in anticipation of cardiac cath. Acute renal failure creatinine mildly elevated.  Closely  monitor. Hypothyroidism on Synthroid . Hypertension takes ARB Lasix .  Since patient has non-ST elevation MI will need close monitoring further workup and more than 2 midnight stay.   DVT prophylaxis: Heparin  infusion. Code Status: DNR. Family Communication: Discussed with patient. Disposition Plan: Cardiac telemetry. Consults called: Cardiology. Admission status: Observation.

## 2023-09-02 NOTE — Consult Note (Signed)
 Cardiology Consultation   Patient ID: Vicki Vicki MRN: [id number]; DOB: 18-Jul-1923  Admit date: 09/01/2023 Date of Consult: 09/02/2023  PCP:  Vicki Joen HERO, MD   [facility] Providers Cardiologist:  Vicki Clay, MD        Patient Profile: Vicki Vicki is a 89 y.o. female with a hx of coronary artery disease (NSTEMI),  paroxysmal AF, HFrEF, mitral regurgitation, hypertension, hyperlipidemia, chronic kidney disease stage III, hypothyroidism, and rheumatoid arthritis who is being seen 09/02/2023 for the evaluation of chest pain at the request of the emergency department.  History of Present Illness: Vicki Vicki was sent to the ER at the request of her granddaughter, who was concerned.  Would states she started having chest pain after she ate this past Sunday.  Her symptoms went away at some point, however today they have returned.  She had no other associated symptoms with her chest pain.  Review of systems negative for shortness of breath, palpitations, syncope, orthopnea, paroxysmal nocturnal dyspnea.  Vicki Vicki was recently evaluated at [facility] from 02/06/2023 - 02/09/2023 for an NSTEMI.  During this point her troponin level was 2400.  Echocardiogram during this time showed EF of 40 to 45% with global hypokinesis, normal RV function.  Left heart catheterization was recommended, however patient declined at that time and wanted to be treated with medical management.  She was started on IV heparin  48 hours.    Per recent Cardiology provider She was hospitalized most recently in March 2025 in the setting of acute on chronic HFrEF, atrial fibrillation with RVR.  Echocardiogram in 05/2023 showed EF 20 to 25%, severely decreased LV function, moderately reduced RV systolic function, severely dilated left atrium, severe mitral valve regurgitation, mild aortic valve stenosis, mean gradient 5 mmHg.  She declined cardiac catheterization.  She was started on amiodarone .  She  declined anticoagulation.  She was last seen in the office on 07/16/2023 was stable overall from a cardiac standpoint.  She did report significant fatigue on amiodarone .  Dose was reduced to 100 mg every other day.  Repeat echocardiogram in 08/2023 showed EF 25 to 30%, severely decreased LV function, LV global hypokinesis, G2 DD, normal RV systolic function, severe mitral valve regurgitation, no evidence of aortic valve stenosis.  Titration of GDMT was recommended.   In the ER, heart rates include sinus rhythm with ventricular rate in the 80s, unifocal PVCs noted on telemetry, blood pressure 1 44-86, 97% on room air.  Notable labs include high-sensitivity troponin 47 -> 178, serum creatinine 1.05, K4.1, BNP 820, white blood cell count 9.3, hemoglobin 12.2.   Past Medical History:  Diagnosis Date   Hypertension    Hypothyroidism     No past surgical history on file.   Home Medications:  Prior to Admission medications   Medication Sig Start Date End Date Taking? Authorizing Provider  amiodarone  (PACERONE ) 200 MG tablet Take 0.5 tablets (100 mg total) by mouth every other day. Starting on June 16,2025 07/16/23   Vicki Vicki ORN, MD  aspirin  EC 81 MG tablet Take 81 mg by mouth daily. Swallow whole.    [provider]  Biotin 10 MG CAPS Take 10 mg by mouth daily.    [provider]  Calcium  Carb-Cholecalciferol (CALCIUM  600/VITAMIN D PO) Take 1 tablet by mouth daily.    [provider]  Carboxymethylcellul-Glycerin (CLEAR EYES FOR DRY EYES OP) Place 2 drops into both eyes daily as needed (Dry eyes).    [provider]  clotrimazole-betamethasone (LOTRISONE) cream Apply 1 Application topically 2 (two) times daily as needed (irritation). 11/18/20 11/05/23  [provider]  feeding supplement (ENSURE ENLIVE / ENSURE PLUS) LIQD Take 237 mLs by mouth 2 (two) times daily between meals. Patient taking differently: Take 237 mLs by mouth 2 (two) times daily between  meals. Prefers chocolate Ensure. No Vanilla 02/09/23   Vicki Elgin LABOR, MD  furosemide  (LASIX ) 20 MG tablet Take 20 mg by mouth daily. 09/13/20   [provider]  isosorbide  mononitrate (IMDUR ) 30 MG 24 hr tablet Take 30 mg by mouth daily. 04/28/23   [provider]  levothyroxine  (SYNTHROID ) 50 MCG tablet Take 50 mcg by mouth daily. 09/05/13   [provider]  losartan  (COZAAR ) 25 MG tablet Take 1 tablet (25 mg total) by mouth daily. 07/16/23 10/14/23  Vicki Vicki ORN, MD  melatonin 3 MG TABS tablet Take 6 mg by mouth at bedtime.    [provider]  Multiple Vitamins-Minerals (THERA-M) TABS Take by mouth.    [provider]  Omega-3 Fatty Acids (FISH OIL) 1000 MG CAPS Take 1,000 mg by mouth daily.    [provider]  omeprazole (PRILOSEC) 20 MG capsule Take 20 mg by mouth daily.    [provider]    Scheduled Meds:  Continuous Infusions:  cefTRIAXone (ROCEPHIN)  IV     PRN Meds:   Allergies:    Allergies  Allergen Reactions   Codeine Nausea Only    Makes her crazy Hallucinations    Latex Itching   Penicillins Rash    YEAST INFECTION  YEAST INFECTION   Sulfa Antibiotics Anaphylaxis and Other (See Comments)    Heart stops   Thiopental Anaphylaxis and Rash    Other reaction(s): Unknown  CARDIAC ARREST  CARDIAC ARREST   Statins Itching    Itchy scalp with hair loss, confusion    Social History:   Social History   Socioeconomic History   Marital status: Widowed    Spouse name: Not on file   Number of children: Not on file   Years of education: Not on file   Highest education level: Not on file  Occupational History   Not on file  Tobacco Use   Smoking status: Never   Smokeless tobacco: Never  Substance and Sexual Activity   Alcohol use: Never   Drug use: Never   Sexual activity: Not on file  Other Topics Concern   Not on file  Social History Narrative   Not on file   Social Drivers of Health    Financial Resource Strain: Low Risk  (07/20/2023)   Received [facility]   Overall Financial Resource Strain (CARDIA)    Difficulty of Paying Living Expenses: Not hard at all  Food Insecurity: No Food Insecurity (07/20/2023)   Received from [facility]   Hunger Vital Sign    Within the past 12 months, you worried that your food would run out before you got the money to buy more.: Never true    Within the past 12 months, the food you bought just didn't last and you didn't have money to get more.: Never true  Transportation Needs: No Transportation Needs (07/20/2023)   Received from [facility] - Transportation    Lack of Transportation (Medical): No    Lack of Transportation (Non-Medical): No  Physical Activity: Unknown (07/20/2023)   Received from [facility]   Exercise Vital Sign    On average, how many days  per week do you engage in moderate to strenuous exercise (like a brisk walk)?: 3 days    Minutes of Exercise per Session: Not on file  Stress: No Stress Concern Present (07/20/2023)   Received from [facility] - Occupational Stress Questionnaire    Feeling of Stress : Not at all  Social Connections: Socially Integrated (07/20/2023)   Received from [facility]   Social Network    How would you rate your social network (family, work, friends)?: Good participation with social networks  Intimate Partner Violence: Not At Risk (07/20/2023)   Received from [facility]   HITS    Over the last 12 months how often did your partner physically hurt you?: Never    Over the last 12 months how often did your partner insult you or talk down to you?: Never    Over the last 12 months how often did your partner threaten you with physical harm?: Never    Over the last 12 months how often did your partner scream or curse at you?: Never    Family History:   No family history on file.   ROS:  Please see the history of present  illness.   All other ROS reviewed and negative.     Physical Exam/Data: Vitals:   09/01/23 2105 09/01/23 2225 09/01/23 2315 09/02/23 0059  BP:  128/70 113/60   Pulse:  91 80   Resp:  16 15   Temp:    98.1 F (36.7 C)  TempSrc:    Oral  SpO2:  100% 97%   Weight: 63.5 kg     Height: 5' 2 (1.575 m)      No intake or output data in the 24 hours ending 09/02/23 0123    09/01/2023    9:05 PM 08/27/2023   10:56 AM 07/16/2023    2:17 PM  Last 3 Weights  Weight (lbs) 140 lb 143 lb 9.6 oz 141 lb 3.2 oz  Weight (kg) 63.504 kg 65.137 kg 64.048 kg     Body mass index is 25.61 kg/m.  General:  Frail, elderly lady  HEENT: normal Neck: no JVD Vascular: No carotid bruits; Distal pulses 2+ bilaterally Cardiac:  normal S1, S2; RRR; no murmur  Lungs:  clear to auscultation bilaterally, no wheezing, rhonchi or rales  Abd: soft, nontender, no hepatomegaly  Ext: no edema Musculoskeletal:  No deformities, BUE and BLE strength normal and equal Skin: warm and dry  Neuro:  CNs 2-12 intact, no focal abnormalities noted Psych:  Normal affect   EKG:  The EKG was personally reviewed and demonstrates:  sinus rhythm Telemetry:  Telemetry was personally reviewed and demonstrates:  sinus rhythm with occasional PVCs  Relevant CV Studies:   TTE 08/16/2023  FINDINGS   Left Ventricle: Left ventricular ejection fraction, by estimation, is 25  to 30%. The left ventricle has severely decreased function. The left  ventricle demonstrates global hypokinesis. The left ventricular internal  cavity size was mildly dilated. There  is no left ventricular hypertrophy. Left ventricular diastolic parameters  are consistent with Grade II diastolic dysfunction (pseudonormalization).  Elevated left atrial pressure.   Laboratory Data: High Sensitivity Troponin:   Recent Labs  Lab 09/01/23 2110 09/01/23 2259  TROPONINIHS 47* 178*     Chemistry Recent Labs  Lab 09/01/23 2110  NA 134*  K 4.1  CL 103  CO2  20*  GLUCOSE 168*  BUN 21  CREATININE 1.05*  CALCIUM  9.2  GFRNONAA 48*  ANIONGAP 11    Recent Labs  Lab 09/01/23 2259  PROT 6.1*  ALBUMIN 3.4*  AST 25  ALT 26  ALKPHOS 54  BILITOT 0.6   Lipids No results for input(s): CHOL, TRIG, HDL, LABVLDL, LDLCALC, CHOLHDL in the last 168 hours.  Hematology Recent Labs  Lab 09/01/23 2110  WBC 9.3  RBC 4.04  HGB 12.2  HCT 37.6  MCV 93.1  MCH 30.2  MCHC 32.4  RDW 16.9*  PLT 278   Thyroid  No results for input(s): TSH, FREET4 in the last 168 hours.  BNP Recent Labs  Lab 09/01/23 2110  BNP 820.3*    DDimer No results for input(s): DDIMER in the last 168 hours.  Radiology/Studies:  CT Angio Chest/Abd/Pel for Dissection W and/or W/WO Result Date: 09/02/2023 CLINICAL DATA:  Chest pain radiating to the back. Aortic aneurysm suspected. EXAM: CT ANGIOGRAPHY CHEST, ABDOMEN AND PELVIS TECHNIQUE: Non-contrast CT of the chest was initially obtained. Multidetector CT imaging through the chest, abdomen and pelvis was performed using the standard protocol during bolus administration of intravenous contrast. Multiplanar reconstructed images and MIPs were obtained and reviewed to evaluate the vascular anatomy. RADIATION DOSE REDUCTION: This exam was performed according to the departmental dose-optimization program which includes automated exposure control, adjustment of the mA and/or kV according to patient size and/or use of iterative reconstruction technique. CONTRAST:  75mL OMNIPAQUE  IOHEXOL  350 MG/ML SOLN COMPARISON:  None Available. FINDINGS: CTA CHEST FINDINGS Cardiovascular: Cardiomegaly. Coronary artery and aortic atherosclerotic calcification. No acute aortic syndrome. No pericardial effusion. Mediastinum/Nodes: Trachea and esophagus are unremarkable. No thoracic adenopathy. She is Lungs/Pleura: Scarring in the lower lobes. Lower lung interlobular septal thickening compatible with edema. No focal pneumonia. No pleural effusion or  pneumothorax. Musculoskeletal: No acute fracture. Sclerotic lesion in the T6 vertebral body is favored benign. Review of the MIP images confirms the above findings. CTA ABDOMEN AND PELVIS FINDINGS VASCULAR Aortic atherosclerotic calcification without hemodynamically significant stenosis. No aneurysm or dissection. Calcified atherosclerosis at the origins of the mesenteric, renal, and throughout the iliac arteries. No aneurysm or dissection. Review of the MIP images confirms the above findings. NON-VASCULAR Hepatobiliary: No acute abnormality.  Cholecystectomy. Pancreas: Unremarkable. Spleen: Unremarkable. Adrenals/Urinary Tract: Normal adrenal glands. No urinary calculi or hydronephrosis. Unremarkable bladder were not obscured by streak artifact. Stomach/Bowel: Normal caliber large and small bowel. Extensive colonic diverticulosis greatest about the sigmoid colon. There is mild wall thickening of the sigmoid colon which may be due to hypertrophy or mild diverticulitis. Stomach is within normal limits. Lymphatic: No lymphadenopathy. Reproductive: Hysterectomy. Other: No free intraperitoneal fluid or air. Musculoskeletal: No acute fracture. Demineralization. Right THA. Postoperative changes about the lumbar spine. Review of the MIP images confirms the above findings. IMPRESSION: 1. No acute aortic syndrome. 2. Cardiomegaly with mild interstitial pulmonary edema. 3. Extensive colonic diverticulosis greatest about the sigmoid colon. There is mild wall thickening of the sigmoid colon which may be due to smooth muscle hypertrophy or mild diverticulitis. 4. Aortic Atherosclerosis (ICD10-I70.0). Electronically Signed   By: Norman Gatlin M.D.   On: 09/02/2023 00:23   DG Chest 2 View Result Date: 09/01/2023 CLINICAL DATA:  Chest pain. EXAM: CHEST - 2 VIEW COMPARISON:  May 08, 2023 FINDINGS: The cardiac silhouette is mildly enlarged and unchanged in size. There is marked severity calcification of the thoracic aorta.  Diffusely increased interstitial lung markings are seen. Mild areas of atelectasis and/or infiltrate are present within the bilateral lung bases. No pleural effusion or pneumothorax is identified.  Radiopaque surgical clips are seen overlying the right upper quadrant. The visualized skeletal structures are unremarkable. IMPRESSION: 1. Stable cardiomegaly mild to moderate severity interstitial pulmonary edema. 2. Mild bibasilar atelectasis and/or infiltrate. Electronically Signed   By: Suzen Dials M.D.   On: 09/01/2023 21:49   Vicki Vicki is a 89 y.o. female with a hx of coronary artery disease (NSTEMI),  paroxysmal AF, HFrEF, mitral regurgitation, hypertension, hyperlipidemia, chronic kidney disease stage III, hypothyroidism, and rheumatoid arthritis who is being seen 09/02/2023 for the evaluation of chest pain at the request of the emergency department.  Presents with intermittent chest pain and evidence of acute myocardial injury. She is in sinus rhythm.  Has intermediate to high pre-test probability of obstructive coronary artery disease.  Euvolemic on exam. In the past, she has declined left heart catheterization.  She states she will think about pursuing repeat catheterization and discussed with her son.  In the meantime we will treat for acute coronary syndrome with systemic IV heparin .  Patient denied Plavix  given prior history of bruising of the skin.  Assessment and Plan: Continue aspirin  IV heparin  for ACS protocol Discuss LHC tomorrow   Risk Assessment/Risk Scores:    TIMI Risk Score for Unstable Angina or Non-ST Elevation MI:   The patient's TIMI risk score is  , which indicates a  % risk of all cause mortality, new or recurrent myocardial infarction or need for urgent revascularization in the next 14 days.         For questions or updates, please contact  [facility] Please consult [email or web address] for contact info under    Signed, Vicki Poteat A Rion Schnitzer, MD  09/02/2023  1:23 AM

## 2023-09-02 NOTE — Progress Notes (Addendum)
 [facility]                                                                              Vicki [name], is a 89 y.o. female, DOB - 01-18-1924, FMW:[id number] Admit date - 09/01/2023    Outpatient Primary MD for the patient is Ryter-Brown, Joen HERO, MD  LOS - 0  days  Chief Complaint  Patient presents with   Chest Pain       Brief summary   Patient is a 89 year old female with chronic HFrEF last 2D echo done in March 2025 showed EF of 20-25% with prior history of NSTEMI  in December 2024 when patient declined cardiac cath, CKD stage II, hypothyroidism, HTN was brought to the ER after patient started developing chest pain.  Patient started having retrosternal chest pain radiating to the back while on the commode.  Patient had a similar episode about 3 days ago which resolved without any intervention.  Patient denied any shortness of breath productive cough fever chills.  Has had wound on the left leg from recent scratch on the furniture.  No abdominal pain.   ED Course:  BNP 820.3, troponin 47-178 CTA chest abdomen pelvis showed no acute aortic syndrome, cardiomegaly with mild interstitial edema, extensive colonic diverticulosis greatest about the sigmoid colon, mild wall thickening of the sigmoid colon which may be due to mild diverticulitis  Patient was placed on IV Lasix , started on Rocephin for the left leg   Assessment & Plan    Principal Problem:   Non-STEMI (non-ST elevated myocardial infarction) (HCC) -2D echo 05/2023 showed EF 20 to 25%, severely decreased LV function, moderately reduced RV systolic function, severe MR, mild aortic stenosis.   - Continue aspirin , losartan , Coreg , Imdur , Lasix , Crestor  - Seen by cardiology, declined cardiac cath. Per Dr. Victor, will continue medical management and also recommended palliative consult - No acute chest pain      Chronic combined systolic and diastolic CHF (congestive heart failure) (HCC) - Per most recent  echo, EF 20 to 25%.  Seen by cardiology, in the setting of multiple comorbidities, recommended palliative consult.     Essential hypertension -BP controlled    Hypothyroidism Continue Synthroid     Paroxysmal atrial fibrillation (HCC) -Cardiology following, continue amiodarone  100 mg daily      ARF (acute renal failure) (HCC) -Resolved, creatinine 1.05 on admission, improved to 0.9    Leg wound, left Likely due to #1 Wound care consult     Estimated body mass index is 25.61 kg/m as calculated from the following:   Height as of this encounter: 5' 2 (1.575 m).   Weight as of this encounter: 63.5 kg.  Code Status: DNR DVT Prophylaxis:     Level of Care: Level of care: Telemetry Cardiac Family Communication: Updated patient Disposition Plan:      Remains inpatient appropriate: PT evaluation today, hopefully to SNF tomorrow   Procedures:  None  Consultants:   None  Antimicrobials:   Anti-infectives (From admission, onward)    Start     Dose/Rate Route Frequency Ordered Stop   09/02/23 2200  cefTRIAXone (ROCEPHIN) 1 g in sodium chloride   0.9 % 100 mL IVPB        1 g 200 mL/hr over 30 Minutes Intravenous Every 24 hours 09/02/23 0604     09/02/23 0130  cefTRIAXone (ROCEPHIN) 1 g in sodium chloride  0.9 % 100 mL IVPB        1 g 200 mL/hr over 30 Minutes Intravenous  Once 09/02/23 0121 09/02/23 0213          Medications  [START ON 09/03/2023] amiodarone   100 mg Oral QODAY   [START ON 09/03/2023] aspirin  EC  81 mg Oral Daily   isosorbide  mononitrate  30 mg Oral Daily   levothyroxine   50 mcg Oral Daily   pantoprazole   40 mg Oral Daily      Subjective:   Vicki [name] was seen and examined today.  No acute complaints, sitting up in the chair this morning eating breakfast.  Feels good and does not want any cardiac cath.  Patient denies dizziness, chest pain, shortness of breath, abdominal pain, N/V/D/C, new weakness, numbess,  Objective:   Vitals:   09/02/23  0700 09/02/23 0716 09/02/23 0800 09/02/23 1039  BP: (!) 102/56  103/64 (!) 106/54  Pulse: 72  75 77  Resp: 13  16 19   Temp:  97.7 F (36.5 C)    TempSrc:  Oral    SpO2: 98%  97% 98%  Weight:      Height:        Intake/Output Summary (Last 24 hours) at 09/02/2023 1107 Last data filed at 09/02/2023 0351 Gross per 24 hour  Intake 100 ml  Output 700 ml  Net -600 ml     Wt Readings from Last 3 Encounters:  09/01/23 63.5 kg  08/27/23 65.1 kg  07/16/23 64 kg     Exam General: Alert and oriented x 3, NAD, sitting up in the chair, eating breakfast Cardiovascular: S1 S2 auscultated,  RRR Respiratory: CTA B Gastrointestinal: Soft, nontender, nondistended, + bowel sounds Ext: no pedal edema bilaterally Neuro: no new deficits Psych: Normal affect     Data Reviewed:  I have personally reviewed following labs    CBC Lab Results  Component Value Date   WBC 9.0 09/02/2023   RBC 3.84 (L) 09/02/2023   HGB 11.9 (L) 09/02/2023   HCT 35.8 (L) 09/02/2023   MCV 93.2 09/02/2023   MCH 31.0 09/02/2023   PLT 266 09/02/2023   MCHC 33.2 09/02/2023   RDW 16.7 (H) 09/02/2023   LYMPHSABS 3.1 09/02/2023   MONOABS 1.3 (H) 09/02/2023   EOSABS 0.2 09/02/2023   BASOSABS 0.2 (H) 09/02/2023     Last metabolic panel Lab Results  Component Value Date   NA 137 09/02/2023   K 3.5 09/02/2023   CL 102 09/02/2023   CO2 23 09/02/2023   BUN 19 09/02/2023   CREATININE 0.94 09/02/2023   GLUCOSE 108 (H) 09/02/2023   GFRNONAA 55 (L) 09/02/2023   CALCIUM  8.8 (L) 09/02/2023   PHOS 3.4 02/06/2023   PROT 6.1 (L) 09/02/2023   ALBUMIN 3.3 (L) 09/02/2023   BILITOT 0.6 09/02/2023   ALKPHOS 57 09/02/2023   AST 42 (H) 09/02/2023   ALT 37 09/02/2023   ANIONGAP 12 09/02/2023    CBG (last 3)  No results for input(s): GLUCAP in the last 72 hours.    Coagulation Profile: No results for input(s): INR, PROTIME in the last 168 hours.   Radiology Studies: I have personally reviewed the imaging  studies  CT Angio Chest/Abd/Pel for Dissection W and/or W/WO Result Date:  09/02/2023 CLINICAL DATA:  Chest pain radiating to the back. Aortic aneurysm suspected. EXAM: CT ANGIOGRAPHY CHEST, ABDOMEN AND PELVIS TECHNIQUE: Non-contrast CT of the chest was initially obtained. Multidetector CT imaging through the chest, abdomen and pelvis was performed using the standard protocol during bolus administration of intravenous contrast. Multiplanar reconstructed images and MIPs were obtained and reviewed to evaluate the vascular anatomy. RADIATION DOSE REDUCTION: This exam was performed according to the departmental dose-optimization program which includes automated exposure control, adjustment of the mA and/or kV according to patient size and/or use of iterative reconstruction technique. CONTRAST:  75mL OMNIPAQUE  IOHEXOL  350 MG/ML SOLN COMPARISON:  None Available. FINDINGS: CTA CHEST FINDINGS Cardiovascular: Cardiomegaly. Coronary artery and aortic atherosclerotic calcification. No acute aortic syndrome. No pericardial effusion. Mediastinum/Nodes: Trachea and esophagus are unremarkable. No thoracic adenopathy. She is Lungs/Pleura: Scarring in the lower lobes. Lower lung interlobular septal thickening compatible with edema. No focal pneumonia. No pleural effusion or pneumothorax. Musculoskeletal: No acute fracture. Sclerotic lesion in the T6 vertebral body is favored benign. Review of the MIP images confirms the above findings. CTA ABDOMEN AND PELVIS FINDINGS VASCULAR Aortic atherosclerotic calcification without hemodynamically significant stenosis. No aneurysm or dissection. Calcified atherosclerosis at the origins of the mesenteric, renal, and throughout the iliac arteries. No aneurysm or dissection. Review of the MIP images confirms the above findings. NON-VASCULAR Hepatobiliary: No acute abnormality.  Cholecystectomy. Pancreas: Unremarkable. Spleen: Unremarkable. Adrenals/Urinary Tract: Normal adrenal glands. No urinary  calculi or hydronephrosis. Unremarkable bladder were not obscured by streak artifact. Stomach/Bowel: Normal caliber large and small bowel. Extensive colonic diverticulosis greatest about the sigmoid colon. There is mild wall thickening of the sigmoid colon which may be due to hypertrophy or mild diverticulitis. Stomach is within normal limits. Lymphatic: No lymphadenopathy. Reproductive: Hysterectomy. Other: No free intraperitoneal fluid or air. Musculoskeletal: No acute fracture. Demineralization. Right THA. Postoperative changes about the lumbar spine. Review of the MIP images confirms the above findings. IMPRESSION: 1. No acute aortic syndrome. 2. Cardiomegaly with mild interstitial pulmonary edema. 3. Extensive colonic diverticulosis greatest about the sigmoid colon. There is mild wall thickening of the sigmoid colon which may be due to smooth muscle hypertrophy or mild diverticulitis. 4. Aortic Atherosclerosis (ICD10-I70.0). Electronically Signed   By: Norman Gatlin M.D.   On: 09/02/2023 00:23   DG Chest 2 View Result Date: 09/01/2023 CLINICAL DATA:  Chest pain. EXAM: CHEST - 2 VIEW COMPARISON:  May 08, 2023 FINDINGS: The cardiac silhouette is mildly enlarged and unchanged in size. There is marked severity calcification of the thoracic aorta. Diffusely increased interstitial lung markings are seen. Mild areas of atelectasis and/or infiltrate are present within the bilateral lung bases. No pleural effusion or pneumothorax is identified. Radiopaque surgical clips are seen overlying the right upper quadrant. The visualized skeletal structures are unremarkable. IMPRESSION: 1. Stable cardiomegaly mild to moderate severity interstitial pulmonary edema. 2. Mild bibasilar atelectasis and/or infiltrate. Electronically Signed   By: Suzen Dials M.D.   On: 09/01/2023 21:49       Christain Niznik M.D. [facility] 09/02/2023, 11:07 AM  Available via Epic secure chat 7am-7pm After 7 pm, please refer to  night coverage provider listed on amion.

## 2023-09-02 NOTE — Progress Notes (Addendum)
 Patient seen and examined.  Agree with plan per Dr. Gail.  [name] is a 89 year old female with a history of CAD, chronic systolic heart failure, PAF, mitral regurgitation, CKD, rheumatoid arthritis, hypothyroidism who presented with chest pain.  She was admitted in December with NSTEMI, troponin peak 2400.  Echocardiogram showed EF 40 to 45%.  She declined LHC at that time.  She was hospitalized in March 2025 with acute on chronic heart failure, A-fib with RVR.  Echocardiogram at that time showed EF 20-25%, severe mitral regurgitation.  She declined LHC at that time as well.  She also declined anticoagulation.  Echocardiogram 08/2023 showed EF 25 to 30%.  She presented with chest pain this admission, troponin 47 > 178.  Creatinine 0.94, hemoglobin 11.9.  CTA chest abdomen pelvis showed no acute aortic syndrome, severe coronary calcifications.  She was started on medical management for NSTEMI, on heparin  drip.  We discussed LHC but she declines invasive procedures, which is reasonable given her age.  Will plan medical management for NSTEMI.  Will update echo.  Would recommend palliative c/s.  [name], MD

## 2023-09-02 NOTE — TOC CM/SW Note (Addendum)
 Transition of Care [facility]) - Inpatient Brief Assessment   Patient Details  Name: Vicki [name] MRN: [id number] Date of Birth: [birth year]  Transition [facility]) CM/SW Contact:    Waddell Barnie Rama, RN Phone Number: 09/02/2023, 2:59 PM   Clinical Narrative: From home alone,, has PCP and insurance on file, states she has an aide from 5;30 to 9:30 Mon- Friday, she has a walker and bsc at home.  States her son will transport them home at [employer] and family is support system, states gets medications from [facility] and [facility].  Pta self ambulatory.  Patient gives this NCM permission to speak with son.   Transition of Care Asessment: Insurance and Status: Insurance coverage has been reviewed Patient has primary care physician: Yes Home environment has been reviewed: home alone Prior level of function:: ambulatory with walker Prior/Current Home Services: Current home services (aide from 5:30 to 9:30 Mon thru Friday) Social Drivers of Health Review: SDOH reviewed no interventions necessary Readmission risk has been reviewed: Yes Transition of care needs: no transition of care needs at this time

## 2023-09-02 NOTE — Progress Notes (Signed)
 PHARMACY - ANTICOAGULATION CONSULT NOTE  Pharmacy Consult for heparin  Indication: chest pain/ACS  Allergies  Allergen Reactions   Codeine Nausea Only    Makes her crazy Hallucinations    Latex Itching   Penicillins Rash    YEAST INFECTION  YEAST INFECTION   Sulfa Antibiotics Anaphylaxis and Other (See Comments)    Heart stops   Thiopental Anaphylaxis and Rash    Other reaction(s): Unknown  CARDIAC ARREST  CARDIAC ARREST   Statins Itching    Itchy scalp with hair loss, confusion    Patient Measurements: Height: 5' 2 (157.5 cm) Weight: 63.4 kg (139 lb 12.4 oz) IBW/kg (Calculated) : 50.1 HEPARIN  DW (KG): 62.9  Vital Signs: Temp: 98.4 F (36.9 C) (07/03 1938) Temp Source: Oral (07/03 1938) BP: 115/54 (07/03 1938) Pulse Rate: 75 (07/03 1418)  Labs: Recent Labs    09/01/23 2110 09/01/23 2259 09/02/23 0434 09/02/23 0848 09/02/23 2042  HGB 12.2  --  11.9*  --   --   HCT 37.6  --  35.8*  --   --   PLT 278  --  266  --   --   HEPARINUNFRC  --   --   --  0.25* 0.11*  CREATININE 1.05*  --  0.94  --   --   TROPONINIHS 47* 178*  --   --   --     Estimated Creatinine Clearance: 29.9 mL/min (by C-G formula based on SCr of 0.94 mg/dL).   Medical History: Past Medical History:  Diagnosis Date   Hypertension    Hypothyroidism     Assessment: Vicki [name] is a 89 y.o. year old female admitted on 09/01/2023 with concern for NSTEMI. No heart cath, will plan for medical management at this time. No anticoagulation prior to admission. Of note pt was dx'd with Afib earlier this year but declined anticoagulation. Pharmacy consulted to dose heparin .  Heparin  level 0.11, subtherapeutic despite dose increase. RN confirmed no pauses / line issues.  No issues with infusion or bleeding noted by RN.    Goal of Therapy:  Heparin  level 0.3-0.7 units/ml Monitor platelets by anticoagulation protocol: Yes   Plan:  Increase heparin  infusion at 1200 units/hr  8h heparin  level   Daily heparin  level, CBC, and monitoring for bleeding F/u plans for anticoagulation   Thank you for allowing pharmacy to participate in this patient's care.  Rankin Sams, PharmD, BCPS, BCCCP Clinical Pharmacist

## 2023-09-02 NOTE — Consult Note (Addendum)
 WOC Nurse Consult Note: Consult requested for left leg wound. Performed remotely after review of progress notes and photos in the EMR.  Left anterior leg with full thickness wound; approx 65% yellow slough, 35% red.  Dressing procedure/placement/frequency:Topical treatment orders provided for bedside nurses to perform as follows to assist with removal of nonviable tissue: Apply Medihoney to left leg wound Q day, then cover with foam dressing.  Change foam dressing Q 3 days or PRN soiling. Please re-consult if further assistance is needed.  Thank-you,  Stephane Fought MSN, RN, CWOCN, [place], CNS [phone]

## 2023-09-02 NOTE — ED Notes (Signed)
 Patient was found to have accidentally removed IV access. Heparin  drip was paused, new IV access established, Heparin  restarted. Patient states no needs at this time, in NAD.

## 2023-09-02 NOTE — Plan of Care (Signed)

## 2023-09-02 NOTE — Progress Notes (Signed)
 PHARMACY - ANTICOAGULATION CONSULT NOTE  Pharmacy Consult for heparin  Indication: chest pain/ACS  Allergies  Allergen Reactions   Codeine Nausea Only    Makes her crazy Hallucinations    Latex Itching   Penicillins Rash    YEAST INFECTION  YEAST INFECTION   Sulfa Antibiotics Anaphylaxis and Other (See Comments)    Heart stops   Thiopental Anaphylaxis and Rash    Other reaction(s): Unknown  CARDIAC ARREST  CARDIAC ARREST   Statins Itching    Itchy scalp with hair loss, confusion    Patient Measurements: Height: 5' 2 (157.5 cm) Weight: 63.5 kg (140 lb) IBW/kg (Calculated) : 50.1 HEPARIN  DW (KG): 62.9  Vital Signs: Temp: 98.1 F (36.7 C) (07/03 0059) Temp Source: Oral (07/03 0059) BP: 113/60 (07/02 2315) Pulse Rate: 80 (07/02 2315)  Labs: Recent Labs    09/01/23 2110 09/01/23 2259  HGB 12.2  --   HCT 37.6  --   PLT 278  --   CREATININE 1.05*  --   TROPONINIHS 47* 178*    Estimated Creatinine Clearance: 26.8 mL/min (A) (by C-G formula based on SCr of 1.05 mg/dL (H)).   Medical History: Past Medical History:  Diagnosis Date   Hypertension    Hypothyroidism     Assessment: 89yo female c/o CP radiating to back, initial troponin elevated and rising, pt declines invasive therapy >> to begin heparin  as part of medical management of ACS; of note pt was dx'd with Afib earlier this year but declined anticoagulation and is not on any AC PTA.  Goal of Therapy:  Heparin  level 0.3-0.7 units/ml Monitor platelets by anticoagulation protocol: Yes   Plan:  Heparin  2000 units IV bolus followed by infusion at 1000 units/hr (based on recent heparin  requirements). Monitor heparin  levels and CBC.  Marvetta Dauphin, PharmD, BCPS  09/02/2023,2:00 AM

## 2023-09-03 DIAGNOSIS — I480 Paroxysmal atrial fibrillation: Secondary | ICD-10-CM | POA: Diagnosis not present

## 2023-09-03 DIAGNOSIS — I10 Essential (primary) hypertension: Secondary | ICD-10-CM | POA: Diagnosis not present

## 2023-09-03 DIAGNOSIS — I214 Non-ST elevation (NSTEMI) myocardial infarction: Secondary | ICD-10-CM | POA: Diagnosis not present

## 2023-09-03 DIAGNOSIS — I509 Heart failure, unspecified: Secondary | ICD-10-CM | POA: Diagnosis not present

## 2023-09-03 DIAGNOSIS — I5042 Chronic combined systolic (congestive) and diastolic (congestive) heart failure: Secondary | ICD-10-CM

## 2023-09-03 LAB — CBC
HCT: 34.8 % — ABNORMAL LOW (ref 36.0–46.0)
Hemoglobin: 11.2 g/dL — ABNORMAL LOW (ref 12.0–15.0)
MCH: 29.8 pg (ref 26.0–34.0)
MCHC: 32.2 g/dL (ref 30.0–36.0)
MCV: 92.6 fL (ref 80.0–100.0)
Platelets: 228 K/uL (ref 150–400)
RBC: 3.76 MIL/uL — ABNORMAL LOW (ref 3.87–5.11)
RDW: 16.8 % — ABNORMAL HIGH (ref 11.5–15.5)
WBC: 7.3 K/uL (ref 4.0–10.5)
nRBC: 0 % (ref 0.0–0.2)

## 2023-09-03 LAB — RENAL FUNCTION PANEL
Albumin: 2.9 g/dL — ABNORMAL LOW (ref 3.5–5.0)
Anion gap: 8 (ref 5–15)
BUN: 15 mg/dL (ref 8–23)
CO2: 24 mmol/L (ref 22–32)
Calcium: 8.6 mg/dL — ABNORMAL LOW (ref 8.9–10.3)
Chloride: 105 mmol/L (ref 98–111)
Creatinine, Ser: 0.82 mg/dL (ref 0.44–1.00)
GFR, Estimated: >60 mL/min (ref >60)
Glucose, Bld: 90 mg/dL (ref 70–99)
Phosphorus: 3.4 mg/dL (ref 2.5–4.6)
Potassium: 3.5 mmol/L (ref 3.5–5.1)
Sodium: 137 mmol/L (ref 135–145)

## 2023-09-03 LAB — HEPARIN LEVEL (UNFRACTIONATED): Heparin Unfractionated: 0.31 [IU]/mL (ref 0.30–0.70)

## 2023-09-03 MED ORDER — LOSARTAN POTASSIUM 25 MG PO TABS
25.0000 mg | ORAL_TABLET | Freq: Every day | ORAL | Status: DC
  Administered 2023-09-03 – 2023-09-05 (×3): 25 mg via ORAL
  Filled 2023-09-03 (×3): qty 1

## 2023-09-03 NOTE — Progress Notes (Signed)
 [facility]                                                                              Vicki [name], is a 89 y.o. female, DOB - [birth year], FMW:[id number] Admit date - 09/01/2023    Outpatient Primary MD for the patient is Ryter-Brown, Joen HERO, MD  LOS - 1  days  Chief Complaint  Patient presents with   Chest Pain       Brief summary   Patient is a 89 year old female with chronic HFrEF last 2D echo done in March 2025 showed EF of 20-25% with prior history of NSTEMI  in December 2024 when patient declined cardiac cath, CKD stage II, hypothyroidism, HTN was brought to the ER after patient started developing chest pain.  Patient started having retrosternal chest pain radiating to the back while on the commode.  Patient had a similar episode about 3 days ago which resolved without any intervention.  Patient denied any shortness of breath productive cough fever chills.  Has had wound on the left leg from recent scratch on the furniture.  No abdominal pain.   ED Course:  BNP 820.3, troponin 47-178 CTA chest abdomen pelvis showed no acute aortic syndrome, cardiomegaly with mild interstitial edema, extensive colonic diverticulosis greatest about the sigmoid colon, mild wall thickening of the sigmoid colon which may be due to mild diverticulitis  Patient was placed on IV Lasix , started on Rocephin  for the left leg   Assessment & Plan    Principal Problem:   Non-STEMI (non-ST elevated myocardial infarction) (HCC) -2D echo 05/2023 showed EF 20 to 25%, severely decreased LV function, moderately reduced RV systolic function, severe MR, mild aortic stenosis.   - Continue aspirin , losartan , Coreg , Imdur , Lasix , Crestor .  Patient declined cardiac cath -Cardiology following, recommended medical management, continue heparin  drip for 48 hours, aspirin  81 mg daily, Imdur  and palliative consult. - Palliative medicine following.    Chronic combined systolic and diastolic CHF  (congestive heart failure) (HCC) - Per most recent echo, EF 20 to 25%.   - On medical management for #1, palliative medicine consulted    Essential hypertension -BP controlled    Hypothyroidism Continue Synthroid     Paroxysmal atrial fibrillation (HCC) -Cardiology following, continue amiodarone  100 mg daily Continue IV heparin  drip   Acute kidney injury -Resolved, creatinine 1.05 on admission - Improved, at baseline    Leg wound, left Likely due to #1 Wound care following     Estimated body mass index is 26.17 kg/m as calculated from the following:   Height as of this encounter: 5' 2 (1.575 m).   Weight as of this encounter: 64.9 kg.  Code Status: DNR DVT Prophylaxis:     Level of Care: Level of care: Telemetry Cardiac Family Communication: Updated patient Disposition Plan:      Remains inpatient appropriate:    Procedures:  None  Consultants:   None  Antimicrobials:   Anti-infectives (From admission, onward)    Start     Dose/Rate Route Frequency Ordered Stop   09/02/23 2200  cefTRIAXone  (ROCEPHIN ) 1 g in sodium chloride  0.9 % 100 mL IVPB  1 g 200 mL/hr over 30 Minutes Intravenous Every 24 hours 09/02/23 0604     09/02/23 0130  cefTRIAXone  (ROCEPHIN ) 1 g in sodium chloride  0.9 % 100 mL IVPB        1 g 200 mL/hr over 30 Minutes Intravenous  Once 09/02/23 0121 09/02/23 0213          Medications  amiodarone   100 mg Oral QODAY   aspirin  EC  81 mg Oral Daily   isosorbide  mononitrate  30 mg Oral Daily   leptospermum manuka honey  1 Application Topical Daily   levothyroxine   50 mcg Oral Daily   losartan   25 mg Oral Daily   pantoprazole   40 mg Oral Daily      Subjective:   Vicki [name] was seen and examined today.  Eating breakfast without any difficulty, no acute complaints.  No acute chest pain, shortness of breath, fever chills nausea vomiting.  Asking when she can go home.  Objective:   Vitals:   09/03/23 0407 09/03/23 0505  09/03/23 0811 09/03/23 1130  BP: (!) 108/51  137/74 (!) 110/53  Pulse:   92 72  Resp: 15     Temp: 97.6 F (36.4 C)  97.7 F (36.5 C) 97.6 F (36.4 C)  TempSrc: Oral  Oral Oral  SpO2: 98%  97% 98%  Weight:  64.9 kg    Height:        Intake/Output Summary (Last 24 hours) at 09/03/2023 1310 Last data filed at 09/03/2023 0512 Gross per 24 hour  Intake 236 ml  Output 550 ml  Net -314 ml     Wt Readings from Last 3 Encounters:  09/03/23 64.9 kg  08/27/23 65.1 kg  07/16/23 64 kg   Physical Exam General: Alert and oriented, NAD Cardiovascular: S1 S2 clear, RRR.  Respiratory: CTAB, no wheezing Gastrointestinal: Soft, nontender, nondistended, NBS Ext: no pedal edema bilaterally Neuro: no new deficits Psych: Normal affect     Data Reviewed:  I have personally reviewed following labs    CBC Lab Results  Component Value Date   WBC 7.3 09/03/2023   RBC 3.76 (L) 09/03/2023   HGB 11.2 (L) 09/03/2023   HCT 34.8 (L) 09/03/2023   MCV 92.6 09/03/2023   MCH 29.8 09/03/2023   PLT 228 09/03/2023   MCHC 32.2 09/03/2023   RDW 16.8 (H) 09/03/2023   LYMPHSABS 3.1 09/02/2023   MONOABS 1.3 (H) 09/02/2023   EOSABS 0.2 09/02/2023   BASOSABS 0.2 (H) 09/02/2023     Last metabolic panel Lab Results  Component Value Date   NA 137 09/03/2023   K 3.5 09/03/2023   CL 105 09/03/2023   CO2 24 09/03/2023   BUN 15 09/03/2023   CREATININE 0.82 09/03/2023   GLUCOSE 90 09/03/2023   GFRNONAA >60 09/03/2023   CALCIUM  8.6 (L) 09/03/2023   PHOS 3.4 09/03/2023   PROT 6.1 (L) 09/02/2023   ALBUMIN 2.9 (L) 09/03/2023   BILITOT 0.6 09/02/2023   ALKPHOS 57 09/02/2023   AST 42 (H) 09/02/2023   ALT 37 09/02/2023   ANIONGAP 8 09/03/2023    CBG (last 3)  No results for input(s): GLUCAP in the last 72 hours.    Coagulation Profile: No results for input(s): INR, PROTIME in the last 168 hours.   Radiology Studies: I have personally reviewed the imaging studies  ECHOCARDIOGRAM  LIMITED Result Date: 09/02/2023    ECHOCARDIOGRAM LIMITED REPORT   Patient Name:   Vicki [name] Date of Exam: 09/02/2023 Medical Rec #:  [id number]       Height:       62.0 in Accession #:    [id number]      Weight:       140.0 lb Date of Birth:  Aug 09, 1923      BSA:          1.643 m Patient Age:    89 years        BP:           113/56 mmHg Patient Gender: F               HR:           73 bpm. Exam Location:  Inpatient Procedure: Cardiac Doppler, Color Doppler, Limited Echo and Intracardiac            Opacification Agent (Both Spectral and Color Flow Doppler were            utilized during procedure). Indications:    NSTEMI  History:        Patient has prior history of Echocardiogram examinations, most                 recent 08/16/2023. CHF, Acute MI, Arrythmias:Atrial Fibrillation,                 Signs/Symptoms:Chest Pain; Risk Factors:Hypertension.  Sonographer:    Therisa Crouch Referring Phys: [id number] CHRISTOPHER L SCHUMANN IMPRESSIONS  1. Left ventricular ejection fraction, by estimation, is 20 to 25%. The left ventricle has severely decreased function. The left ventricle demonstrates global hypokinesis.  2. Right ventricular systolic function is low normal. The right ventricular size is normal. There is normal pulmonary artery systolic pressure.  3. The mitral valve is degenerative. incompletely visualized mitral valve regurgitation. Moderate mitral annular calcification.  4. The inferior vena cava is normal in size with greater than 50% respiratory variability, suggesting right atrial pressure of 3 mmHg. Comparison(s): Prior images reviewed side by side. EF appears worse compared to prior. FINDINGS  Left Ventricle: Left ventricular ejection fraction, by estimation, is 20 to 25%. The left ventricle has severely decreased function. The left ventricle demonstrates global hypokinesis. Definity  contrast agent was given IV to delineate the left ventricular endocardial borders. Right Ventricle: The right ventricular  size is normal. Right vetricular wall thickness was not well visualized. Right ventricular systolic function is low normal. There is normal pulmonary artery systolic pressure. The tricuspid regurgitant velocity is 2.47 m/s, and with an assumed right atrial pressure of 3 mmHg, the estimated right ventricular systolic pressure is 27.4 mmHg. Mitral Valve: The mitral valve is degenerative in appearance. There is mild thickening of the mitral valve leaflet(s). There is mild calcification of the mitral valve leaflet(s). Moderate mitral annular calcification. Incompletely visualized mitral valve  regurgitation. Venous: The inferior vena cava is normal in size with greater than 50% respiratory variability, suggesting right atrial pressure of 3 mmHg. LEFT VENTRICLE PLAX 2D LVIDd:         5.10 cm LVIDs:         4.50 cm LV PW:         0.80 cm LV IVS:        0.70 cm  LV Volumes (MOD) LV vol d, MOD A2C: 134.0 ml LV vol d, MOD A4C: 152.0 ml LV vol s, MOD A2C: 99.4 ml LV vol s, MOD A4C: 112.0 ml LV SV MOD A2C:     34.6 ml LV SV MOD A4C:     152.0 ml LV SV MOD  BP:      38.0 ml RIGHT VENTRICLE            IVC RV S prime:     8.81 cm/s  IVC diam: 1.40 cm TAPSE (M-mode): 1.7 cm LEFT ATRIUM         Index LA diam:    5.20 cm 3.17 cm/m  TRICUSPID VALVE TR Peak grad:   24.4 mmHg TR Vmax:        247.00 cm/s Shelda Bruckner MD Electronically signed by Shelda Bruckner MD Signature Date/Time: 09/02/2023/6:51:04 PM    Final    CT Angio Chest/Abd/Pel for Dissection W and/or W/WO Result Date: 09/02/2023 CLINICAL DATA:  Chest pain radiating to the back. Aortic aneurysm suspected. EXAM: CT ANGIOGRAPHY CHEST, ABDOMEN AND PELVIS TECHNIQUE: Non-contrast CT of the chest was initially obtained. Multidetector CT imaging through the chest, abdomen and pelvis was performed using the standard protocol during bolus administration of intravenous contrast. Multiplanar reconstructed images and MIPs were obtained and reviewed to evaluate the  vascular anatomy. RADIATION DOSE REDUCTION: This exam was performed according to the departmental dose-optimization program which includes automated exposure control, adjustment of the mA and/or kV according to patient size and/or use of iterative reconstruction technique. CONTRAST:  75mL OMNIPAQUE  IOHEXOL  350 MG/ML SOLN COMPARISON:  None Available. FINDINGS: CTA CHEST FINDINGS Cardiovascular: Cardiomegaly. Coronary artery and aortic atherosclerotic calcification. No acute aortic syndrome. No pericardial effusion. Mediastinum/Nodes: Trachea and esophagus are unremarkable. No thoracic adenopathy. She is Lungs/Pleura: Scarring in the lower lobes. Lower lung interlobular septal thickening compatible with edema. No focal pneumonia. No pleural effusion or pneumothorax. Musculoskeletal: No acute fracture. Sclerotic lesion in the T6 vertebral body is favored benign. Review of the MIP images confirms the above findings. CTA ABDOMEN AND PELVIS FINDINGS VASCULAR Aortic atherosclerotic calcification without hemodynamically significant stenosis. No aneurysm or dissection. Calcified atherosclerosis at the origins of the mesenteric, renal, and throughout the iliac arteries. No aneurysm or dissection. Review of the MIP images confirms the above findings. NON-VASCULAR Hepatobiliary: No acute abnormality.  Cholecystectomy. Pancreas: Unremarkable. Spleen: Unremarkable. Adrenals/Urinary Tract: Normal adrenal glands. No urinary calculi or hydronephrosis. Unremarkable bladder were not obscured by streak artifact. Stomach/Bowel: Normal caliber large and small bowel. Extensive colonic diverticulosis greatest about the sigmoid colon. There is mild wall thickening of the sigmoid colon which may be due to hypertrophy or mild diverticulitis. Stomach is within normal limits. Lymphatic: No lymphadenopathy. Reproductive: Hysterectomy. Other: No free intraperitoneal fluid or air. Musculoskeletal: No acute fracture. Demineralization. Right THA.  Postoperative changes about the lumbar spine. Review of the MIP images confirms the above findings. IMPRESSION: 1. No acute aortic syndrome. 2. Cardiomegaly with mild interstitial pulmonary edema. 3. Extensive colonic diverticulosis greatest about the sigmoid colon. There is mild wall thickening of the sigmoid colon which may be due to smooth muscle hypertrophy or mild diverticulitis. 4. Aortic Atherosclerosis (ICD10-I70.0). Electronically Signed   By: Norman Gatlin M.D.   On: 09/02/2023 00:23   DG Chest 2 View Result Date: 09/01/2023 CLINICAL DATA:  Chest pain. EXAM: CHEST - 2 VIEW COMPARISON:  May 08, 2023 FINDINGS: The cardiac silhouette is mildly enlarged and unchanged in size. There is marked severity calcification of the thoracic aorta. Diffusely increased interstitial lung markings are seen. Mild areas of atelectasis and/or infiltrate are present within the bilateral lung bases. No pleural effusion or pneumothorax is identified. Radiopaque surgical clips are seen overlying the right upper quadrant. The visualized skeletal structures are unremarkable. IMPRESSION: 1. Stable cardiomegaly mild to moderate severity interstitial pulmonary  edema. 2. Mild bibasilar atelectasis and/or infiltrate. Electronically Signed   By: Suzen Dials M.D.   On: 09/01/2023 21:49       Pope Brunty M.D. [facility] 09/03/2023, 1:10 PM  Available via Epic secure chat 7am-7pm After 7 pm, please refer to night coverage provider listed on amion.

## 2023-09-03 NOTE — Progress Notes (Signed)
 PHARMACY - ANTICOAGULATION CONSULT NOTE  Pharmacy Consult for Heparin  Indication: chest pain/ACS/ NSTEMI  Allergies  Allergen Reactions   Codeine Nausea Only    Makes her crazy Hallucinations    Latex Itching   Penicillins Rash    YEAST INFECTION  YEAST INFECTION   Sulfa Antibiotics Anaphylaxis and Other (See Comments)    Heart stops   Thiopental Anaphylaxis and Rash    Other reaction(s): Unknown  CARDIAC ARREST  CARDIAC ARREST   Statins Itching    Itchy scalp with hair loss, confusion    Patient Measurements: Height: 5' 2 (157.5 cm) Weight: 64.9 kg (143 lb 1.3 oz) IBW/kg (Calculated) : 50.1 HEPARIN  DW (KG): 62.9  Vital Signs: Temp: 97.7 F (36.5 C) (07/04 0811) Temp Source: Oral (07/04 0811) BP: 137/74 (07/04 0811) Pulse Rate: 92 (07/04 0811)  Labs: Recent Labs    09/01/23 2110 09/01/23 2259 09/02/23 0434 09/02/23 0848 09/02/23 2042 09/03/23 0715  HGB 12.2  --  11.9*  --   --  11.2*  HCT 37.6  --  35.8*  --   --  34.8*  PLT 278  --  266  --   --  228  HEPARINUNFRC  --   --   --  0.25* 0.11* 0.31  CREATININE 1.05*  --  0.94  --   --  0.82  TROPONINIHS 47* 178*  --   --   --   --     Estimated Creatinine Clearance: 34.7 mL/min (by C-G formula based on SCr of 0.82 mg/dL).  Assessment: 89 y.o. year old female admitted on 09/01/2023 with concern for NSTEMI. No heart cath, will plan for medical management at this time. No anticoagulation prior to admission. Of note pt was diagnosed with Afib earlier this year but declined anticoagulation. Pharmacy consulted to dose heparin .   Heparin  level is now low therapeutic (0.31) on 1200 units/hr after infusion rate increased last night. CBC stable, no bleeding reported.    Noted plan for heparin  x 48 hrs. Begun ~2am on 09/02/23 so expect it to stop on 7/5 am.  Goal of Therapy:  Heparin  level 0.3-0.7 units/ml Monitor platelets by anticoagulation protocol: Yes   Plan:  Increase heparin  drip to 1300 units/hr to try to  keep level in range. Daily heparin  level and CBC while on heparin . Follow up for stopping heparin  after ~48 hrs of treatment.  Genaro Zebedee Calin, RPh 09/03/2023,11:15 AM

## 2023-09-03 NOTE — Progress Notes (Addendum)
 Rounding Note   Patient Name: Vicki [name] Date of Encounter: 09/03/2023  [facility] Cardiologist: Alm Clay, MD   Subjective BP 137/74.  Creatinine 0.82.  Hemoglobin 11.2.  Denies any chest pain or dyspnea  Scheduled Meds:  amiodarone   100 mg Oral QODAY   aspirin  EC  81 mg Oral Daily   isosorbide  mononitrate  30 mg Oral Daily   leptospermum manuka honey  1 Application Topical Daily   levothyroxine   50 mcg Oral Daily   pantoprazole   40 mg Oral Daily   Continuous Infusions:  cefTRIAXone  (ROCEPHIN )  IV 1 g (09/02/23 2135)   heparin  1,200 Units/hr (09/02/23 2237)   PRN Meds: clotrimazole -betamethasone , melatonin   Vital Signs  Vitals:   09/03/23 0004 09/03/23 0407 09/03/23 0505 09/03/23 0811  BP: (!) 94/55 (!) 108/51  137/74  Pulse:    92  Resp:  15    Temp: 98.1 F (36.7 C) 97.6 F (36.4 C)  97.7 F (36.5 C)  TempSrc: Oral Oral  Oral  SpO2:  98%  97%  Weight:   64.9 kg   Height:        Intake/Output Summary (Last 24 hours) at 09/03/2023 0908 Last data filed at 09/03/2023 9487 Gross per 24 hour  Intake 344.23 ml  Output 550 ml  Net -205.77 ml      09/03/2023    5:05 AM 09/02/2023    2:11 PM 09/01/2023    9:05 PM  Last 3 Weights  Weight (lbs) 143 lb 1.3 oz 139 lb 12.4 oz 140 lb  Weight (kg) 64.9 kg 63.4 kg 63.504 kg      Telemetry NSR with PVCs - Personally Reviewed  ECG  NO new ECG - Personally Reviewed  Physical Exam  GEN: No acute distress.   Neck: No JVD Cardiac: RRR, no murmurs, rubs, or gallops.  Respiratory: Clear to auscultation bilaterally. GI: Soft, nontender, non-distended  MS: Trivial edema Neuro:  Nonfocal  Psych: Normal affect   Labs High Sensitivity Troponin:   Recent Labs  Lab 09/01/23 2110 09/01/23 2259  TROPONINIHS 47* 178*     Chemistry Recent Labs  Lab 09/01/23 2110 09/01/23 2259 09/02/23 0434 09/03/23 0715  NA 134*  --  137 137  K 4.1  --  3.5 3.5  CL 103  --  102 105  CO2 20*  --  23 24  GLUCOSE  168*  --  108* 90  BUN 21  --  19 15  CREATININE 1.05*  --  0.94 0.82  CALCIUM  9.2  --  8.8* 8.6*  PROT  --  6.1* 6.1*  --   ALBUMIN  --  3.4* 3.3* 2.9*  AST  --  25 42*  --   ALT  --  26 37  --   ALKPHOS  --  54 57  --   BILITOT  --  0.6 0.6  --   GFRNONAA 48*  --  55* >60  ANIONGAP 11  --  12 8    Lipids No results for input(s): CHOL, TRIG, HDL, LABVLDL, LDLCALC, CHOLHDL in the last 168 hours.  Hematology Recent Labs  Lab 09/01/23 2110 09/02/23 0434 09/03/23 0715  WBC 9.3 9.0 7.3  RBC 4.04 3.84* 3.76*  HGB 12.2 11.9* 11.2*  HCT 37.6 35.8* 34.8*  MCV 93.1 93.2 92.6  MCH 30.2 31.0 29.8  MCHC 32.4 33.2 32.2  RDW 16.9* 16.7* 16.8*  PLT 278 266 228   Thyroid  No results for input(s): TSH, FREET4  in the last 168 hours.  BNP Recent Labs  Lab 09/01/23 2110  BNP 820.3*    DDimer No results for input(s): DDIMER in the last 168 hours.   Radiology  ECHOCARDIOGRAM LIMITED Result Date: 09/02/2023    ECHOCARDIOGRAM LIMITED REPORT   Patient Name:   Vicki [name] Date of Exam: 09/02/2023 Medical Rec #:  [id number]       Height:       62.0 in Accession #:    [id number]      Weight:       140.0 lb Date of Birth:  06-25-1923      BSA:          1.643 m Patient Age:    89 years        BP:           113/56 mmHg Patient Gender: F               HR:           73 bpm. Exam Location:  Inpatient Procedure: Cardiac Doppler, Color Doppler, Limited Echo and Intracardiac            Opacification Agent (Both Spectral and Color Flow Doppler were            utilized during procedure). Indications:    NSTEMI  History:        Patient has prior history of Echocardiogram examinations, most                 recent 08/16/2023. CHF, Acute MI, Arrythmias:Atrial Fibrillation,                 Signs/Symptoms:Chest Pain; Risk Factors:Hypertension.  Sonographer:    Therisa Crouch Referring Phys: [id number] Chrisma Hurlock L Keyasha Miah IMPRESSIONS  1. Left ventricular ejection fraction, by estimation, is 20 to 25%. The  left ventricle has severely decreased function. The left ventricle demonstrates global hypokinesis.  2. Right ventricular systolic function is low normal. The right ventricular size is normal. There is normal pulmonary artery systolic pressure.  3. The mitral valve is degenerative. incompletely visualized mitral valve regurgitation. Moderate mitral annular calcification.  4. The inferior vena cava is normal in size with greater than 50% respiratory variability, suggesting right atrial pressure of 3 mmHg. Comparison(s): Prior images reviewed side by side. EF appears worse compared to prior. FINDINGS  Left Ventricle: Left ventricular ejection fraction, by estimation, is 20 to 25%. The left ventricle has severely decreased function. The left ventricle demonstrates global hypokinesis. Definity  contrast agent was given IV to delineate the left ventricular endocardial borders. Right Ventricle: The right ventricular size is normal. Right vetricular wall thickness was not well visualized. Right ventricular systolic function is low normal. There is normal pulmonary artery systolic pressure. The tricuspid regurgitant velocity is 2.47 m/s, and with an assumed right atrial pressure of 3 mmHg, the estimated right ventricular systolic pressure is 27.4 mmHg. Mitral Valve: The mitral valve is degenerative in appearance. There is mild thickening of the mitral valve leaflet(s). There is mild calcification of the mitral valve leaflet(s). Moderate mitral annular calcification. Incompletely visualized mitral valve  regurgitation. Venous: The inferior vena cava is normal in size with greater than 50% respiratory variability, suggesting right atrial pressure of 3 mmHg. LEFT VENTRICLE PLAX 2D LVIDd:         5.10 cm LVIDs:         4.50 cm LV PW:         0.80 cm LV IVS:  0.70 cm  LV Volumes (MOD) LV vol d, MOD A2C: 134.0 ml LV vol d, MOD A4C: 152.0 ml LV vol s, MOD A2C: 99.4 ml LV vol s, MOD A4C: 112.0 ml LV SV MOD A2C:     34.6 ml LV  SV MOD A4C:     152.0 ml LV SV MOD BP:      38.0 ml RIGHT VENTRICLE            IVC RV S prime:     8.81 cm/s  IVC diam: 1.40 cm TAPSE (M-mode): 1.7 cm LEFT ATRIUM         Index LA diam:    5.20 cm 3.17 cm/m  TRICUSPID VALVE TR Peak grad:   24.4 mmHg TR Vmax:        247.00 cm/s Shelda Bruckner MD Electronically signed by Shelda Bruckner MD Signature Date/Time: 09/02/2023/6:51:04 PM    Final    CT Angio Chest/Abd/Pel for Dissection W and/or W/WO Result Date: 09/02/2023 CLINICAL DATA:  Chest pain radiating to the back. Aortic aneurysm suspected. EXAM: CT ANGIOGRAPHY CHEST, ABDOMEN AND PELVIS TECHNIQUE: Non-contrast CT of the chest was initially obtained. Multidetector CT imaging through the chest, abdomen and pelvis was performed using the standard protocol during bolus administration of intravenous contrast. Multiplanar reconstructed images and MIPs were obtained and reviewed to evaluate the vascular anatomy. RADIATION DOSE REDUCTION: This exam was performed according to the departmental dose-optimization program which includes automated exposure control, adjustment of the mA and/or kV according to patient size and/or use of iterative reconstruction technique. CONTRAST:  75mL OMNIPAQUE  IOHEXOL  350 MG/ML SOLN COMPARISON:  None Available. FINDINGS: CTA CHEST FINDINGS Cardiovascular: Cardiomegaly. Coronary artery and aortic atherosclerotic calcification. No acute aortic syndrome. No pericardial effusion. Mediastinum/Nodes: Trachea and esophagus are unremarkable. No thoracic adenopathy. She is Lungs/Pleura: Scarring in the lower lobes. Lower lung interlobular septal thickening compatible with edema. No focal pneumonia. No pleural effusion or pneumothorax. Musculoskeletal: No acute fracture. Sclerotic lesion in the T6 vertebral body is favored benign. Review of the MIP images confirms the above findings. CTA ABDOMEN AND PELVIS FINDINGS VASCULAR Aortic atherosclerotic calcification without hemodynamically  significant stenosis. No aneurysm or dissection. Calcified atherosclerosis at the origins of the mesenteric, renal, and throughout the iliac arteries. No aneurysm or dissection. Review of the MIP images confirms the above findings. NON-VASCULAR Hepatobiliary: No acute abnormality.  Cholecystectomy. Pancreas: Unremarkable. Spleen: Unremarkable. Adrenals/Urinary Tract: Normal adrenal glands. No urinary calculi or hydronephrosis. Unremarkable bladder were not obscured by streak artifact. Stomach/Bowel: Normal caliber large and small bowel. Extensive colonic diverticulosis greatest about the sigmoid colon. There is mild wall thickening of the sigmoid colon which may be due to hypertrophy or mild diverticulitis. Stomach is within normal limits. Lymphatic: No lymphadenopathy. Reproductive: Hysterectomy. Other: No free intraperitoneal fluid or air. Musculoskeletal: No acute fracture. Demineralization. Right THA. Postoperative changes about the lumbar spine. Review of the MIP images confirms the above findings. IMPRESSION: 1. No acute aortic syndrome. 2. Cardiomegaly with mild interstitial pulmonary edema. 3. Extensive colonic diverticulosis greatest about the sigmoid colon. There is mild wall thickening of the sigmoid colon which may be due to smooth muscle hypertrophy or mild diverticulitis. 4. Aortic Atherosclerosis (ICD10-I70.0). Electronically Signed   By: Norman Gatlin M.D.   On: 09/02/2023 00:23   DG Chest 2 View Result Date: 09/01/2023 CLINICAL DATA:  Chest pain. EXAM: CHEST - 2 VIEW COMPARISON:  May 08, 2023 FINDINGS: The cardiac silhouette is mildly enlarged and unchanged in size. There is marked severity calcification  of the thoracic aorta. Diffusely increased interstitial lung markings are seen. Mild areas of atelectasis and/or infiltrate are present within the bilateral lung bases. No pleural effusion or pneumothorax is identified. Radiopaque surgical clips are seen overlying the right upper quadrant. The  visualized skeletal structures are unremarkable. IMPRESSION: 1. Stable cardiomegaly mild to moderate severity interstitial pulmonary edema. 2. Mild bibasilar atelectasis and/or infiltrate. Electronically Signed   By: Suzen Dials M.D.   On: 09/01/2023 21:49    Cardiac Studies   Patient Profile   89 y.o. female with a hx of coronary artery disease (NSTEMI),  paroxysmal AF, HFrEF, mitral regurgitation, hypertension, hyperlipidemia, chronic kidney disease stage III, hypothyroidism, and rheumatoid arthritis   Assessment & Plan  NSTEMI: She was admitted in December with NSTEMI, troponin peak 2400.  Echocardiogram showed EF 40 to 45%.  She declined LHC at that time.  She was hospitalized in March 2025 with acute on chronic heart failure, A-fib with RVR.  Echocardiogram at that time showed EF 20-25%, severe mitral regurgitation.  She declined LHC at that time as well.  She also declined anticoagulation.  Echocardiogram 08/2023 showed EF 25 to 30%.  She presented with chest pain this admission, troponin 47 > 178. CTA chest abdomen pelvis showed no acute aortic syndrome, severe coronary calcifications.   - Discussed LHC but she declines invasive procedures, which is reasonable given her age.  Will plan medical management for NSTEMI  - Continue heparin  drip x 48 hours.  Continue aspirin  81 mg daily - Continue Imdur  30 mg daily - Intolerance to statins, not currently taking  Chronic combined heart failure: Echo this admission shows EF 20 to 25%, low normal RV function. - She appears euvolemic.  - Restart home losartan .  Previously on carvedilol  but discontinued due to bradycardia  Paroxysmal atrial fibrillation: She has declined anticoagulation.  On amiodarone  100 mg every other day, maintaining sinus rhythm.  Continue amiodarone .  Previously was on carvedilol  but discontinued due to bradycardia  Goals of care: Recommend palliative consult   For questions or updates, please contact [facility] Please consult [email or web address] for contact info under     Signed, [name], MD  09/03/2023, 9:08 AM

## 2023-09-03 NOTE — Plan of Care (Signed)
  Problem: Activity: Goal: Risk for activity intolerance will decrease Outcome: Progressing   Problem: Nutrition: Goal: Adequate nutrition will be maintained Outcome: Progressing   Problem: Coping: Goal: Level of anxiety will decrease Outcome: Progressing   Problem: Elimination: Goal: Will not experience complications related to urinary retention Outcome: Progressing   

## 2023-09-04 DIAGNOSIS — Z515 Encounter for palliative care: Secondary | ICD-10-CM | POA: Diagnosis not present

## 2023-09-04 DIAGNOSIS — I509 Heart failure, unspecified: Secondary | ICD-10-CM | POA: Diagnosis not present

## 2023-09-04 DIAGNOSIS — I214 Non-ST elevation (NSTEMI) myocardial infarction: Secondary | ICD-10-CM | POA: Diagnosis not present

## 2023-09-04 DIAGNOSIS — Z7189 Other specified counseling: Secondary | ICD-10-CM | POA: Diagnosis not present

## 2023-09-04 DIAGNOSIS — N179 Acute kidney failure, unspecified: Secondary | ICD-10-CM

## 2023-09-04 DIAGNOSIS — I5042 Chronic combined systolic (congestive) and diastolic (congestive) heart failure: Secondary | ICD-10-CM | POA: Diagnosis not present

## 2023-09-04 LAB — CBC
HCT: 33.1 % — ABNORMAL LOW (ref 36.0–46.0)
Hemoglobin: 10.9 g/dL — ABNORMAL LOW (ref 12.0–15.0)
MCH: 30.4 pg (ref 26.0–34.0)
MCHC: 32.9 g/dL (ref 30.0–36.0)
MCV: 92.5 fL (ref 80.0–100.0)
Platelets: 237 K/uL (ref 150–400)
RBC: 3.58 MIL/uL — ABNORMAL LOW (ref 3.87–5.11)
RDW: 17 % — ABNORMAL HIGH (ref 11.5–15.5)
WBC: 9 K/uL (ref 4.0–10.5)
nRBC: 0 % (ref 0.0–0.2)

## 2023-09-04 LAB — HEPARIN LEVEL (UNFRACTIONATED): Heparin Unfractionated: 0.39 [IU]/mL (ref 0.30–0.70)

## 2023-09-04 LAB — RENAL FUNCTION PANEL
Albumin: 2.9 g/dL — ABNORMAL LOW (ref 3.5–5.0)
Anion gap: 11 (ref 5–15)
BUN: 11 mg/dL (ref 8–23)
CO2: 21 mmol/L — ABNORMAL LOW (ref 22–32)
Calcium: 8.7 mg/dL — ABNORMAL LOW (ref 8.9–10.3)
Chloride: 105 mmol/L (ref 98–111)
Creatinine, Ser: 0.97 mg/dL (ref 0.44–1.00)
GFR, Estimated: 53 mL/min — ABNORMAL LOW (ref >60)
Glucose, Bld: 92 mg/dL (ref 70–99)
Phosphorus: 3.6 mg/dL (ref 2.5–4.6)
Potassium: 3.7 mmol/L (ref 3.5–5.1)
Sodium: 137 mmol/L (ref 135–145)

## 2023-09-04 MED ORDER — HEPARIN SODIUM (PORCINE) 5000 UNIT/ML IJ SOLN
5000.0000 [IU] | Freq: Three times a day (TID) | INTRAMUSCULAR | Status: DC
  Administered 2023-09-04 – 2023-09-05 (×3): 5000 [IU] via SUBCUTANEOUS
  Filled 2023-09-04 (×3): qty 1

## 2023-09-04 MED ORDER — ACETAMINOPHEN 325 MG PO TABS
650.0000 mg | ORAL_TABLET | ORAL | Status: DC | PRN
  Administered 2023-09-04 – 2023-09-05 (×3): 650 mg via ORAL
  Filled 2023-09-04 (×3): qty 2

## 2023-09-04 NOTE — Progress Notes (Signed)
~  1930: Bedside report with Jerrell, RN. Family at the bedside, pt has no requests at this time, pt has purewick in place  ~2000: Pt states that purewick was not in place, pt insists on having purewick, new purewick was placed  -Pt states that she needs a new bedpad, bedpad replaced, but she requested a different one, so pad was replaced again  -Pt requests to be changed, full bed change and gown  -PM medications given, pt requests prn melatonin, given  -Pt was stating that no one had taken her blood pressure - BP was taken at 2029 and BP trends discussed with patient  2225: Pt has fallen asleep and is resting comfortably, not in any visible distress  2300: Pt received full bed change, new purewick, expresses concern that there is something wrong with the tubes. NT and RN changed pt, observed that purewick was pulled out of position. Suction checked, working. Pt inquired as to why it doesn't constantly collect urine, RN & NT explained to pt that it collects it externally when she urinates. Pt expressed verbal understanding.   2355: Pt is resting comfortably, no s/s of distress  0030: Pt requests voltaren  gel for knees and hips; MD paged  0045: Pt was repositioned to pt preference  0145: Pt sat at the side of the bed, states that she refuses to go to sleep  0200: PRN voltaren  applied   ~0300: Pt had blood drawn by lab; has no further needs at this time  0315: Pt sleeping comfortably  0400: Pt resting comfortably  0440: Pt used call bell to inform staff that she needed to use the bathroom,  upon arriving in room, pt observed pulling at underwear and purewick stating that it was not working. Pt declined to use BSC or restroom, put purewick back in place. Pt used purewick successfully  0600: pt sleeping

## 2023-09-04 NOTE — Progress Notes (Signed)
 Rounding Note   Patient Name: Vicki [name] Date of Encounter: 09/04/2023  [facility] Cardiologist: Alm Clay, MD   Subjective BP 110/48.  Creatinine 0.97.  Hemoglobin 10.9.  Denies any chest pain or dyspnea  Scheduled Meds:  amiodarone   100 mg Oral QODAY   aspirin  EC  81 mg Oral Daily   isosorbide  mononitrate  30 mg Oral Daily   leptospermum manuka honey  1 Application Topical Daily   levothyroxine   50 mcg Oral Daily   losartan   25 mg Oral Daily   pantoprazole   40 mg Oral Daily   Continuous Infusions:  cefTRIAXone  (ROCEPHIN )  IV 1 g (09/03/23 2102)   heparin  1,300 Units/hr (09/03/23 2348)   PRN Meds: clotrimazole -betamethasone , melatonin   Vital Signs  Vitals:   09/03/23 1935 09/04/23 0040 09/04/23 0439 09/04/23 0741  BP: (!) 113/50 105/60 (!) 112/58 (!) 110/48  Pulse: 77 74  71  Resp: 18  (!) 25 18  Temp: 98.4 F (36.9 C) (!) 97.3 F (36.3 C) 97.9 F (36.6 C) 97.6 F (36.4 C)  TempSrc: Oral Oral Oral Oral  SpO2: 94% 98% 96% 95%  Weight:   64.1 kg   Height:        Intake/Output Summary (Last 24 hours) at 09/04/2023 0950 Last data filed at 09/04/2023 0700 Gross per 24 hour  Intake 360 ml  Output 1500 ml  Net -1140 ml      09/04/2023    4:39 AM 09/03/2023    5:05 AM 09/02/2023    2:11 PM  Last 3 Weights  Weight (lbs) 141 lb 5 oz 143 lb 1.3 oz 139 lb 12.4 oz  Weight (kg) 64.1 kg 64.9 kg 63.4 kg      Telemetry NSR with PVCs - Personally Reviewed  ECG  NO new ECG - Personally Reviewed  Physical Exam  GEN: No acute distress.   Neck: No JVD Cardiac: RRR, no murmurs, rubs, or gallops.  Respiratory: Clear to auscultation bilaterally. GI: Soft, nontender, non-distended  MS: Trivial edema Neuro:  Nonfocal  Psych: Normal affect   Labs High Sensitivity Troponin:   Recent Labs  Lab 09/01/23 2110 09/01/23 2259  TROPONINIHS 47* 178*     Chemistry Recent Labs  Lab 09/01/23 2259 09/02/23 0434 09/03/23 0715 09/04/23 0231  NA  --  137  137 137  K  --  3.5 3.5 3.7  CL  --  102 105 105  CO2  --  23 24 21*  GLUCOSE  --  108* 90 92  BUN  --  19 15 11   CREATININE  --  0.94 0.82 0.97  CALCIUM   --  8.8* 8.6* 8.7*  PROT 6.1* 6.1*  --   --   ALBUMIN 3.4* 3.3* 2.9* 2.9*  AST 25 42*  --   --   ALT 26 37  --   --   ALKPHOS 54 57  --   --   BILITOT 0.6 0.6  --   --   GFRNONAA  --  55* >60 53*  ANIONGAP  --  12 8 11     Lipids No results for input(s): CHOL, TRIG, HDL, LABVLDL, LDLCALC, CHOLHDL in the last 168 hours.  Hematology Recent Labs  Lab 09/02/23 0434 09/03/23 0715 09/04/23 0231  WBC 9.0 7.3 9.0  RBC 3.84* 3.76* 3.58*  HGB 11.9* 11.2* 10.9*  HCT 35.8* 34.8* 33.1*  MCV 93.2 92.6 92.5  MCH 31.0 29.8 30.4  MCHC 33.2 32.2 32.9  RDW 16.7* 16.8* 17.0*  PLT 266 228 237   Thyroid  No results for input(s): TSH, FREET4 in the last 168 hours.  BNP Recent Labs  Lab 09/01/23 2110  BNP 820.3*    DDimer No results for input(s): DDIMER in the last 168 hours.   Radiology  ECHOCARDIOGRAM LIMITED Result Date: 09/02/2023    ECHOCARDIOGRAM LIMITED REPORT   Patient Name:   KLAUDIA BEIRNE Date of Exam: 09/02/2023 Medical Rec #:  [id number]       Height:       62.0 in Accession #:    [id number]      Weight:       140.0 lb Date of Birth:  [birth year]      BSA:          1.643 m Patient Age:    89 years        BP:           113/56 mmHg Patient Gender: F               HR:           73 bpm. Exam Location:  Inpatient Procedure: Cardiac Doppler, Color Doppler, Limited Echo and Intracardiac            Opacification Agent (Both Spectral and Color Flow Doppler were            utilized during procedure). Indications:    NSTEMI  History:        Patient has prior history of Echocardiogram examinations, most                 recent 08/16/2023. CHF, Acute MI, Arrythmias:Atrial Fibrillation,                 Signs/Symptoms:Chest Pain; Risk Factors:Hypertension.  Sonographer:    Therisa Crouch Referring Phys: [id number] Calton Harshfield L Makari Portman  IMPRESSIONS  1. Left ventricular ejection fraction, by estimation, is 20 to 25%. The left ventricle has severely decreased function. The left ventricle demonstrates global hypokinesis.  2. Right ventricular systolic function is low normal. The right ventricular size is normal. There is normal pulmonary artery systolic pressure.  3. The mitral valve is degenerative. incompletely visualized mitral valve regurgitation. Moderate mitral annular calcification.  4. The inferior vena cava is normal in size with greater than 50% respiratory variability, suggesting right atrial pressure of 3 mmHg. Comparison(s): Prior images reviewed side by side. EF appears worse compared to prior. FINDINGS  Left Ventricle: Left ventricular ejection fraction, by estimation, is 20 to 25%. The left ventricle has severely decreased function. The left ventricle demonstrates global hypokinesis. Definity  contrast agent was given IV to delineate the left ventricular endocardial borders. Right Ventricle: The right ventricular size is normal. Right vetricular wall thickness was not well visualized. Right ventricular systolic function is low normal. There is normal pulmonary artery systolic pressure. The tricuspid regurgitant velocity is 2.47 m/s, and with an assumed right atrial pressure of 3 mmHg, the estimated right ventricular systolic pressure is 27.4 mmHg. Mitral Valve: The mitral valve is degenerative in appearance. There is mild thickening of the mitral valve leaflet(s). There is mild calcification of the mitral valve leaflet(s). Moderate mitral annular calcification. Incompletely visualized mitral valve  regurgitation. Venous: The inferior vena cava is normal in size with greater than 50% respiratory variability, suggesting right atrial pressure of 3 mmHg. LEFT VENTRICLE PLAX 2D LVIDd:         5.10 cm LVIDs:         4.50 cm LV PW:  0.80 cm LV IVS:        0.70 cm  LV Volumes (MOD) LV vol d, MOD A2C: 134.0 ml LV vol d, MOD A4C: 152.0 ml  LV vol s, MOD A2C: 99.4 ml LV vol s, MOD A4C: 112.0 ml LV SV MOD A2C:     34.6 ml LV SV MOD A4C:     152.0 ml LV SV MOD BP:      38.0 ml RIGHT VENTRICLE            IVC RV S prime:     8.81 cm/s  IVC diam: 1.40 cm TAPSE (M-mode): 1.7 cm LEFT ATRIUM         Index LA diam:    5.20 cm 3.17 cm/m  TRICUSPID VALVE TR Peak grad:   24.4 mmHg TR Vmax:        247.00 cm/s Shelda Bruckner MD Electronically signed by Shelda Bruckner MD Signature Date/Time: 09/02/2023/6:51:04 PM    Final     Cardiac Studies   Patient Profile   89 y.o. female with a hx of coronary artery disease (NSTEMI),  paroxysmal AF, HFrEF, mitral regurgitation, hypertension, hyperlipidemia, chronic kidney disease stage III, hypothyroidism, and rheumatoid arthritis   Assessment & Plan  NSTEMI: She was admitted in December with NSTEMI, troponin peak 2400.  Echocardiogram showed EF 40 to 45%.  She declined LHC at that time.  She was hospitalized in March 2025 with acute on chronic heart failure, A-fib with RVR.  Echocardiogram at that time showed EF 20-25%, severe mitral regurgitation.  She declined LHC at that time as well.  She also declined anticoagulation.  Echocardiogram 08/2023 showed EF 25 to 30%.  She presented with chest pain this admission, troponin 47 > 178. CTA chest abdomen pelvis showed no acute aortic syndrome, severe coronary calcifications.   - Discussed LHC but she declines invasive procedures, which is reasonable given her age.  Will plan medical management for NSTEMI  - Continue aspirin  81 mg daily - Has completed 48 hours of heparin  drip, can discontinue - Continue Imdur  30 mg daily - Intolerance to statins, not currently taking  Chronic combined heart failure: Echo this admission shows EF 20 to 25%, low normal RV function. - She appears euvolemic.  - Restarted home losartan .  Previously on carvedilol  but discontinued due to bradycardia  Paroxysmal atrial fibrillation: She has declined anticoagulation.  On  amiodarone  100 mg every other day, maintaining sinus rhythm.  Continue amiodarone .  Previously was on carvedilol  but discontinued due to bradycardia  Goals of care: Agree with palliative consult   For questions or updates, please contact [facility] Please consult [email or web address] for contact info under     Signed, Bruckner LITTIE Nanas, MD  09/04/2023, 9:50 AM

## 2023-09-04 NOTE — Progress Notes (Signed)
 Palliative Medicine Progress Note   Patient Name: Vicki [name]       Date: 09/04/2023 DOB: 1923-09-08  Age: 89 y.o. MRN#: [id number] Attending Physician: Davia Nydia POUR, MD Primary Care Physician: Clarance Joen HERO, MD Admit Date: 09/01/2023  Reason for Consultation/Follow-up: {Reason for Consult:23484}  HPI/Patient Profile:  89 y.o. female  with past medical history of HFrEF (last echo in [place] with EF 20-25%), prior NSTEMI in December 2024, CKD stage 2, hypothyroidism, and HTN who presented to the ED on 09/01/2023 with chest pain. Found to have elevated troponin, elevated BNP. Also with left leg wound from recent scratch on the furniture.  She is admitted with NSTEMI.    Palliative Medicine has been consulted for goals of care discussions. Patient and family are faced with anticipatory care needs and complex medical decision making.   Subjective: ***  Objective:  Physical Exam          Vital Signs: BP (!) 116/51 (BP Location: Left Arm)   Pulse 78   Temp 98.4 F (36.9 C) (Oral)   Resp 14   Ht 5' 2 (1.575 m)   Wt 64.1 kg   SpO2 94%   BMI 25.85 kg/m  SpO2: SpO2: 94 % O2 Device: O2 Device: Room Air O2 Flow Rate:    Intake/output summary:  Intake/Output Summary (Last 24 hours) at 09/04/2023 1319 Last data filed at 09/04/2023 0700 Gross per 24 hour  Intake 360 ml  Output 1500 ml  Net -1140 ml    LBM: Last BM Date : 08/31/23     Palliative Assessment/Data: ***     Palliative Medicine Assessment & Plan   Assessment: Principal Problem:   Non-STEMI (non-ST elevated myocardial infarction) (HCC) Active Problems:   NSTEMI (non-ST elevated myocardial infarction) (HCC)   Chronic combined systolic and diastolic heart failure (HCC)   Essential hypertension    Hypothyroidism   PAF (paroxysmal atrial fibrillation) (HCC)   ARF (acute renal failure) (HCC)   Non-ST elevated myocardial infarction (non-STEMI) (HCC)   Leg wound, left    Recommendations/Plan: ***  Goals of Care and Additional Recommendations: Limitations on Scope of Treatment: {Recommended Scope and Preferences:21019}  Code Status:   Prognosis:  {Palliative Care Prognosis:23504}  Discharge Planning: {Palliative dispostion:23505}  Care plan was discussed with ***  Thank you for allowing the Palliative  Medicine Team to assist in the care of this patient.   ***   Recardo KATHEE Loll, NP   Please contact Palliative Medicine Team phone at [phone] for questions and concerns.  For individual providers, please see AMION.

## 2023-09-04 NOTE — Progress Notes (Signed)
 Spoke with son Sheena. I met him and his wife yesterday. The patient called Sheena and expressed frustration with her nursing care and that we had not been there in an hour. He wanted her to come down immediately because she felt like we were attacking her. He reassured me that it was fine, and that she has a history of doing this when she is about to be discharged. He referenced a previous incident at [place].   I told him that I understood. I also let him know that she told me that she felt like no one cared and she expressed that she said that she was financially stressed and had to sell her car just to make ends meet with her home health help and that she asked God everyday, Lord, when you're ready... I'm ready.

## 2023-09-04 NOTE — Evaluation (Signed)
 Physical Therapy Evaluation Patient Details Name: Vicki [name] MRN: [id number] DOB: [birth year] Today's Date: 09/04/2023  History of Present Illness  89 yo female adm 09/01/23 with chest pain NSTEMI, declined LHC, EF 20-25%. PMH - HTN, CAD, NSTEMI, CHF, RA, cardiomyopathy, PAF, CKD  Clinical Impression  Pt pleasant, stating frustration with nursing care and unsure if she recalls times they have been in room. Pt reports back and knee pain from sitting still and able to walk in hall with RW and assist. Pt initially returned to bed but then stated need for BM and walked to bathroom with NT and RN aware of her location and call bell in hand end of session. Pt lives alone with PCA and family assist with plan to return home. Pt states she had HHPT after last admission but they had stopped and would benefit from additional eval at D/C. Pt with decreased strength, transfers and mobility who will benefit from acute therapy to maximize mobility and safety. HR 97-107        If plan is discharge home, recommend the following: A little help with walking and/or transfers;A little help with bathing/dressing/bathroom;Assistance with cooking/housework;Direct supervision/assist for financial management;Assist for transportation;Help with stairs or ramp for entrance   Can travel by private vehicle        Equipment Recommendations None recommended by PT  Recommendations for Other Services       Functional Status Assessment Patient has had a recent decline in their functional status and demonstrates the ability to make significant improvements in function in a reasonable and predictable amount of time.     Precautions / Restrictions Precautions Precautions: Fall Recall of Precautions/Restrictions: Intact      Mobility  Bed Mobility               General bed mobility comments: pt in chair on arrival and bathroom end of session    Transfers Overall transfer level: Needs assistance   Transfers:  Sit to/from Stand Sit to Stand: Supervision           General transfer comment: supervision for safety, decreased control of descent to low bed, good hand placement at [facility]    Ambulation/Gait Ambulation/Gait assistance: Supervision Gait Distance (Feet): 100 Feet Assistive device: Rolling walker (2 wheels) Gait Pattern/deviations: Step-through pattern, Decreased stride length, Trunk flexed   Gait velocity interpretation: <1.8 ft/sec, indicate of risk for recurrent falls   General Gait Details: pt able to self regulate distance, reliant on RW and cues for room number HR 107 with gait  Stairs            Wheelchair Mobility     Tilt Bed    Modified Rankin (Stroke Patients Only)       Balance Overall balance assessment: Needs assistance Sitting-balance support: No upper extremity supported, Feet supported, Feet unsupported Sitting balance-Leahy Scale: Good     Standing balance support: Bilateral upper extremity supported, During functional activity, Reliant on assistive device for balance Standing balance-Leahy Scale: Poor Standing balance comment: RW in standing                             Pertinent Vitals/Pain Pain Assessment Pain Assessment: 0-10 Pain Score: 3  Pain Location: back and knees Pain Descriptors / Indicators: Aching Pain Intervention(s): Limited activity within patient's tolerance, Repositioned, Patient requesting pain meds-RN notified    Home Living Family/patient expects to be discharged to:: Private residence Living Arrangements: Alone Available Help at  Discharge: Available PRN/intermittently;Personal care attendant;Family Type of Home: House Home Access: Stairs to enter Entrance Stairs-Rails: Right;Left;Can reach both Entrance Stairs-Number of Steps: 4   Home Layout: One level Home Equipment: Rollator (4 wheels);BSC/3in1;Shower seat;Toilet riser;Rolling Walker (2 wheels);Other (comment) Additional Comments: PCA for 4hrs in  evening, family does meal prep and pt microwaves things, doesn't drive    Prior Function Prior Level of Function : Needs assist             Mobility Comments: Ambulates with rollator. only does stairs if someone else is there ADLs Comments: ModI with ADLs, requires increased time and intermittent assistance from sitter. Pt states the sitters will set up the shower for her and help her get in/out of shower as there is a small threshold she needs to step over. Pt sits in shower chair while she baths. Pt reports she is able to cook for herself, but her friend will come over a meal prep so she just has to heat up the food. Manages her own medications. Sitter will assist in household management. Relies on Son for transportations.     Extremity/Trunk Assessment   Upper Extremity Assessment Upper Extremity Assessment: Generalized weakness    Lower Extremity Assessment Lower Extremity Assessment: Generalized weakness    Cervical / Trunk Assessment Cervical / Trunk Assessment: Kyphotic  Communication   Communication Communication: Impaired Factors Affecting Communication: Hearing impaired    Cognition Arousal: Alert Behavior During Therapy: WFL for tasks assessed/performed   PT - Cognitive impairments: No apparent impairments                         Following commands: Intact       Cueing Cueing Techniques: Verbal cues     General Comments      Exercises     Assessment/Plan    PT Assessment Patient needs continued PT services  PT Problem List Decreased activity tolerance;Decreased balance;Decreased mobility;Decreased strength;Pain       PT Treatment Interventions DME instruction;Gait training;Stair training;Functional mobility training;Patient/family education;Therapeutic exercise;Therapeutic activities    PT Goals (Current goals can be found in the Care Plan section)  Acute Rehab PT Goals Patient Stated Goal: go home PT Goal Formulation: With  patient Time For Goal Achievement: 09/18/23 Potential to Achieve Goals: Good    Frequency Min 1X/week     Co-evaluation               AM-PAC PT 6 Clicks Mobility  Outcome Measure Help needed turning from your back to your side while in a flat bed without using bedrails?: A Little Help needed moving from lying on your back to sitting on the side of a flat bed without using bedrails?: A Little Help needed moving to and from a bed to a chair (including a wheelchair)?: A Little Help needed standing up from a chair using your arms (e.g., wheelchair or bedside chair)?: A Little Help needed to walk in hospital room?: A Little Help needed climbing 3-5 steps with a railing? : A Lot 6 Click Score: 17    End of Session   Activity Tolerance: Patient tolerated treatment well Patient left: Other (comment);with call bell/phone within reach (bathroom, RN and NT told and chat sent) Nurse Communication: Mobility status PT Visit Diagnosis: Other abnormalities of gait and mobility (R26.89);Muscle weakness (generalized) (M62.81)    Time: 1311-1340 PT Time Calculation (min) (ACUTE ONLY): 29 min   Charges:   PT Evaluation $PT Eval Moderate Complexity: 1 Mod PT  Treatments $Therapeutic Activity: 8-22 mins PT General Charges $$ ACUTE PT VISIT: 1 Visit         Lenoard SQUIBB, PT Acute Rehabilitation Services Office: [phone]   Lenoard NOVAK Cathaleen Korol 09/04/2023, 1:52 PM

## 2023-09-04 NOTE — Progress Notes (Signed)
 [facility]                                                                              Vicki [name], is a 89 y.o. female, DOB - 10-01-1923, FMW:[id number] Admit date - 09/01/2023    Outpatient Primary MD for the patient is Ryter-Brown, Vicki HERO, MD  LOS - 2  days  Chief Complaint  Patient presents with   Chest Pain       Brief summary   Patient is a 89 year old female with chronic HFrEF last 2D echo done in March 2025 showed EF of 20-25% with prior history of NSTEMI  in December 2024 when patient declined cardiac cath, CKD stage II, hypothyroidism, HTN was brought to the ER after patient started developing chest pain.  Patient started having retrosternal chest pain radiating to the back while on the commode.  Patient had a similar episode about 3 days ago which resolved without any intervention.  Patient denied any shortness of breath productive cough fever chills.  Has had wound on the left leg from recent scratch on the furniture.  No abdominal pain.   ED Course:  BNP 820.3, troponin 47-178 CTA chest abdomen pelvis showed no acute aortic syndrome, cardiomegaly with mild interstitial edema, extensive colonic diverticulosis greatest about the sigmoid colon, mild wall thickening of the sigmoid colon which may be due to mild diverticulitis  Patient was placed on IV Lasix , started on Rocephin  for the left leg   Assessment & Plan    Principal Problem:   Non-STEMI (non-ST elevated myocardial infarction) (HCC) -2D echo 05/2023 showed EF 20 to 25%, severely decreased LV function, moderately reduced RV systolic function, severe MR, mild aortic stenosis.   - Continue aspirin , losartan , Coreg , Imdur , Lasix , Crestor .  Patient declined cardiac cath -Cardiology following, recommended medical management, continue heparin  drip for 48 hours, aspirin  81 mg daily, Imdur  30 mg daily and palliative consult. - Palliative medicine following. - Completed 48 hours of IV heparin , IV  heparin  drip discontinued     Chronic combined systolic and diastolic CHF (congestive heart failure) (HCC) - Per most recent echo, EF 20 to 25%.   - On medical management for #1, palliative medicine consulted    Essential hypertension -BP controlled, resumed losartan  - Previously on Coreg  but discontinued due to bradycardia    Hypothyroidism Continue Synthroid     Paroxysmal atrial fibrillation (HCC) -Cardiology following, continue amiodarone  100 mg daily -Previously had a declined anticoagulation, previously on Coreg  but discontinued due to bradycardia   Acute kidney injury -Resolved, creatinine 1.05 on admission - Improved, at baseline    Leg wound, left, cellulitis Likely due to #1 Wound care following, reviewed note, -Continue IV Rocephin   Generalized debility -PT OT evaluation pending  Estimated body mass index is 25.85 kg/m as calculated from the following:   Height as of this encounter: 5' 2 (1.575 m).   Weight as of this encounter: 64.1 kg.  Code Status: DNR DVT Prophylaxis:  heparin  injection 5,000 Units Start: 09/04/23 1400   Level of Care: Level of care: Telemetry Cardiac Family Communication: Updated patient's relative Glendale Simon on the phone, she requested palliative medicine  to call her. Disposition Plan:      Remains inpatient appropriate: Plan to DC home tomorrow   Procedures:  None  Consultants:   Palliative medicine Wound care Cardiology   Antimicrobials:   Anti-infectives (From admission, onward)    Start     Dose/Rate Route Frequency Ordered Stop   09/02/23 2200  cefTRIAXone  (ROCEPHIN ) 1 g in sodium chloride  0.9 % 100 mL IVPB        1 g 200 mL/hr over 30 Minutes Intravenous Every 24 hours 09/02/23 0604     09/02/23 0130  cefTRIAXone  (ROCEPHIN ) 1 g in sodium chloride  0.9 % 100 mL IVPB        1 g 200 mL/hr over 30 Minutes Intravenous  Once 09/02/23 0121 09/02/23 0213          Medications  amiodarone   100 mg Oral QODAY    aspirin  EC  81 mg Oral Daily   heparin   5,000 Units Subcutaneous Q8H   isosorbide  mononitrate  30 mg Oral Daily   leptospermum manuka honey  1 Application Topical Daily   levothyroxine   50 mcg Oral Daily   losartan   25 mg Oral Daily   pantoprazole   40 mg Oral Daily      Subjective:   [name] was seen and examined today.  Feeling better, no chest pain, eating breakfast this morning at the time of my encounter.  No acute shortness of breath, fever chills nausea or vomiting.  Will await PT evaluation Objective:   Vitals:   09/04/23 0040 09/04/23 0439 09/04/23 0741 09/04/23 1100  BP: 105/60 (!) 112/58 (!) 110/48 (!) 116/51  Pulse: 74  71 78  Resp:  (!) 25 18 14   Temp: (!) 97.3 F (36.3 C) 97.9 F (36.6 C) 97.6 F (36.4 C) 98.4 F (36.9 C)  TempSrc: Oral Oral Oral Oral  SpO2: 98% 96% 95% 94%  Weight:  64.1 kg    Height:        Intake/Output Summary (Last 24 hours) at 09/04/2023 1211 Last data filed at 09/04/2023 0700 Gross per 24 hour  Intake 360 ml  Output 1500 ml  Net -1140 ml     Wt Readings from Last 3 Encounters:  09/04/23 64.1 kg  08/27/23 65.1 kg  07/16/23 64 kg   Physical Exam General: Alert and oriented x 3, NAD Cardiovascular: S1 S2 clear, RRR.  Respiratory: CTAB Gastrointestinal: Soft, nontender, nondistended, NBS Ext: no pedal edema bilaterally Neuro: no new deficits Skin: Left leg wound  Psych: Normal affect        Data Reviewed:  I have personally reviewed following labs    CBC Lab Results  Component Value Date   WBC 9.0 09/04/2023   RBC 3.58 (L) 09/04/2023   HGB 10.9 (L) 09/04/2023   HCT 33.1 (L) 09/04/2023   MCV 92.5 09/04/2023   MCH 30.4 09/04/2023   PLT 237 09/04/2023   MCHC 32.9 09/04/2023   RDW 17.0 (H) 09/04/2023   LYMPHSABS 3.1 09/02/2023   MONOABS 1.3 (H) 09/02/2023   EOSABS 0.2 09/02/2023   BASOSABS 0.2 (H) 09/02/2023     Last metabolic panel Lab Results  Component Value Date   NA 137 09/04/2023   K 3.7  09/04/2023   CL 105 09/04/2023   CO2 21 (L) 09/04/2023   BUN 11 09/04/2023   CREATININE 0.97 09/04/2023   GLUCOSE 92 09/04/2023   GFRNONAA 53 (L) 09/04/2023   CALCIUM  8.7 (L) 09/04/2023   PHOS 3.6 09/04/2023   PROT 6.1 (  L) 09/02/2023   ALBUMIN 2.9 (L) 09/04/2023   BILITOT 0.6 09/02/2023   ALKPHOS 57 09/02/2023   AST 42 (H) 09/02/2023   ALT 37 09/02/2023   ANIONGAP 11 09/04/2023    CBG (last 3)  No results for input(s): GLUCAP in the last 72 hours.    Coagulation Profile: No results for input(s): INR, PROTIME in the last 168 hours.   Radiology Studies: I have personally reviewed the imaging studies  ECHOCARDIOGRAM LIMITED Result Date: 09/02/2023    ECHOCARDIOGRAM LIMITED REPORT   Patient Name:   AREONNA BRAN Date of Exam: 09/02/2023 Medical Rec #:  [id number]       Height:       62.0 in Accession #:    [id number]      Weight:       140.0 lb Date of Birth:  1923/11/08      BSA:          1.643 m Patient Age:    89 years        BP:           113/56 mmHg Patient Gender: F               HR:           73 bpm. Exam Location:  Inpatient Procedure: Cardiac Doppler, Color Doppler, Limited Echo and Intracardiac            Opacification Agent (Both Spectral and Color Flow Doppler were            utilized during procedure). Indications:    NSTEMI  History:        Patient has prior history of Echocardiogram examinations, most                 recent 08/16/2023. CHF, Acute MI, Arrythmias:Atrial Fibrillation,                 Signs/Symptoms:Chest Pain; Risk Factors:Hypertension.  Sonographer:    Therisa Crouch Referring Phys: [id number] CHRISTOPHER L SCHUMANN IMPRESSIONS  1. Left ventricular ejection fraction, by estimation, is 20 to 25%. The left ventricle has severely decreased function. The left ventricle demonstrates global hypokinesis.  2. Right ventricular systolic function is low normal. The right ventricular size is normal. There is normal pulmonary artery systolic pressure.  3. The mitral valve is  degenerative. incompletely visualized mitral valve regurgitation. Moderate mitral annular calcification.  4. The inferior vena cava is normal in size with greater than 50% respiratory variability, suggesting right atrial pressure of 3 mmHg. Comparison(s): Prior images reviewed side by side. EF appears worse compared to prior. FINDINGS  Left Ventricle: Left ventricular ejection fraction, by estimation, is 20 to 25%. The left ventricle has severely decreased function. The left ventricle demonstrates global hypokinesis. Definity  contrast agent was given IV to delineate the left ventricular endocardial borders. Right Ventricle: The right ventricular size is normal. Right vetricular wall thickness was not well visualized. Right ventricular systolic function is low normal. There is normal pulmonary artery systolic pressure. The tricuspid regurgitant velocity is 2.47 m/s, and with an assumed right atrial pressure of 3 mmHg, the estimated right ventricular systolic pressure is 27.4 mmHg. Mitral Valve: The mitral valve is degenerative in appearance. There is mild thickening of the mitral valve leaflet(s). There is mild calcification of the mitral valve leaflet(s). Moderate mitral annular calcification. Incompletely visualized mitral valve  regurgitation. Venous: The inferior vena cava is normal in size with greater than 50% respiratory variability, suggesting right atrial pressure  of 3 mmHg. LEFT VENTRICLE PLAX 2D LVIDd:         5.10 cm LVIDs:         4.50 cm LV PW:         0.80 cm LV IVS:        0.70 cm  LV Volumes (MOD) LV vol d, MOD A2C: 134.0 ml LV vol d, MOD A4C: 152.0 ml LV vol s, MOD A2C: 99.4 ml LV vol s, MOD A4C: 112.0 ml LV SV MOD A2C:     34.6 ml LV SV MOD A4C:     152.0 ml LV SV MOD BP:      38.0 ml RIGHT VENTRICLE            IVC RV S prime:     8.81 cm/s  IVC diam: 1.40 cm TAPSE (M-mode): 1.7 cm LEFT ATRIUM         Index LA diam:    5.20 cm 3.17 cm/m  TRICUSPID VALVE TR Peak grad:   24.4 mmHg TR Vmax:         247.00 cm/s Shelda Bruckner MD Electronically signed by Shelda Bruckner MD Signature Date/Time: 09/02/2023/6:51:04 PM    Final        Nydia Distance M.D. [facility] 09/04/2023, 12:11 PM  Available via Epic secure chat 7am-7pm After 7 pm, please refer to night coverage provider listed on amion.

## 2023-09-04 NOTE — Evaluation (Signed)
 Occupational Therapy Evaluation Patient Details Name: Vicki [name] MRN: [id number] DOB: 1923/07/27 Today's Date: 09/04/2023   History of Present Illness   89 yo female adm 09/01/23 with chest pain NSTEMI, declined LHC, EF 20-25%. PMH - HTN, CAD, NSTEMI, CHF, RA, cardiomyopathy, PAF, CKD     Clinical Impressions PTA, pt lived alone with intermittent assist from PCA and family for IADL and shower transfers. Upon eval, pt reports pain in hips and knees greater than usual; asked RN for pain medications and RN has reached out to MD. Pt needing up to CGA for mobility this session and supervision for basic transfers. Limited by pain but suspect overall close to baseline. Pt needing up to CGA for BADL this session. Recommending HHOT for initial home safety eval.      If plan is discharge home, recommend the following:   A little help with walking and/or transfers;A little help with bathing/dressing/bathroom;Assistance with cooking/housework;Assist for transportation;Help with stairs or ramp for entrance     Functional Status Assessment   Patient has had a recent decline in their functional status and demonstrates the ability to make significant improvements in function in a reasonable and predictable amount of time.     Equipment Recommendations   None recommended by OT     Recommendations for Other Services         Precautions/Restrictions   Precautions Precautions: Fall Recall of Precautions/Restrictions: Intact Restrictions Weight Bearing Restrictions Per Provider Order: No     Mobility Bed Mobility Overal bed mobility: Needs Assistance Bed Mobility: Supine to Sit, Sit to Supine     Supine to sit: Contact guard Sit to supine: Supervision        Transfers Overall transfer level: Needs assistance   Transfers: Sit to/from Stand Sit to Stand: Supervision           General transfer comment: supervision for safety, decreased control of descent to low bed,  good hand placement at [facility]      Balance Overall balance assessment: Needs assistance Sitting-balance support: No upper extremity supported, Feet supported, Feet unsupported Sitting balance-Leahy Scale: Good     Standing balance support: Bilateral upper extremity supported, During functional activity, Reliant on assistive device for balance Standing balance-Leahy Scale: Poor Standing balance comment: RW in standing                           ADL either performed or assessed with clinical judgement   ADL Overall ADL's : Needs assistance/impaired Eating/Feeding: Independent;Sitting   Grooming: Standing;Supervision/safety   Upper Body Bathing: Set up;Sitting   Lower Body Bathing: Sit to/from stand;Supervison/ safety   Upper Body Dressing : Set up;Sitting   Lower Body Dressing: Contact guard assist;Sit to/from stand Lower Body Dressing Details (indicate cue type and reason): pt reports she only wears slide in shoes and does not wear socks Toilet Transfer: Supervision/safety           Functional mobility during ADLs: Supervision/safety       Vision Patient Visual Report: No change from baseline Vision Assessment?: No apparent visual deficits     Perception         Praxis         Pertinent Vitals/Pain Pain Assessment Pain Assessment: Faces Faces Pain Scale: Hurts little more Pain Location: hips and knees Pain Descriptors / Indicators: Aching Pain Intervention(s): Limited activity within patient's tolerance, Monitored during session     Extremity/Trunk Assessment Upper Extremity Assessment Upper Extremity Assessment:  Generalized weakness   Lower Extremity Assessment Lower Extremity Assessment: Generalized weakness   Cervical / Trunk Assessment Cervical / Trunk Assessment: Kyphotic   Communication Communication Communication: Impaired Factors Affecting Communication: Hearing impaired   Cognition Arousal: Alert Behavior During Therapy: WFL for  tasks assessed/performed Cognition: No family/caregiver present to determine baseline             OT - Cognition Comments: follows commands, is oriented, is able to describe daily routines and her set-up for medication management                 Following commands: Intact       Cueing  General Comments   Cueing Techniques: Verbal cues  c/o pain in hips and knees with RN notified and reaching out to MD for PRN medications   Exercises     Shoulder Instructions      Home Living Family/patient expects to be discharged to:: Private residence Living Arrangements: Alone Available Help at Discharge: Available PRN/intermittently;Personal care attendant;Family Type of Home: House Home Access: Stairs to enter [employer] of Steps: 4 Entrance Stairs-Rails: Right;Left;Can reach both Home Layout: One level     Bathroom Shower/Tub: Producer, television/film/video: Handicapped height     Home Equipment: Rollator (4 wheels);BSC/3in1;Shower seat;Toilet riser;Rolling Walker (2 wheels);Other (comment)   Additional Comments: PCA for 4hrs in evening, family does meal prep and pt microwaves things, doesn't drive      Prior Functioning/Environment Prior Level of Function : Needs assist             Mobility Comments: Ambulates with rollator. only does stairs if someone else is there ADLs Comments: ModI with ADLs, requires increased time and intermittent assistance from sitter. Pt states the sitters will set up the shower for her and help her get in/out of shower as there is a small threshold she needs to step over. Pt sits in shower chair while she baths. Pt reports she is able to cook for herself, but her friend will come over a meal prep so she just has to heat up the food. Manages her own medications. Sitter will assist in household management. Relies on Son for transportations.    OT Problem List: Decreased strength;Decreased activity tolerance;Impaired balance  (sitting and/or standing);Decreased knowledge of use of DME or AE;Pain   OT Treatment/Interventions: Self-care/ADL training;Therapeutic exercise;DME and/or AE instruction;Therapeutic activities;Patient/family education;Balance training      OT Goals(Current goals can be found in the care plan section)   Acute Rehab OT Goals Patient Stated Goal: get better OT Goal Formulation: With patient Time For Goal Achievement: 09/18/23 Potential to Achieve Goals: Good   OT Frequency:  Min 2X/week    Co-evaluation              AM-PAC OT 6 Clicks Daily Activity     Outcome Measure Help from another person eating meals?: None Help from another person taking care of personal grooming?: A Little Help from another person toileting, which includes using toliet, bedpan, or urinal?: A Little Help from another person bathing (including washing, rinsing, drying)?: A Little Help from another person to put on and taking off regular upper body clothing?: A Little Help from another person to put on and taking off regular lower body clothing?: A Little 6 Click Score: 19   End of Session Equipment Utilized During Treatment: Gait belt;Rolling walker (2 wheels) Nurse Communication: Mobility status  Activity Tolerance: Patient tolerated treatment well Patient left: in bed;with call bell/phone within  reach;with bed alarm set  OT Visit Diagnosis: Unsteadiness on feet (R26.81);Muscle weakness (generalized) (M62.81);Pain                Time: 8582-8560 OT Time Calculation (min): 22 min Charges:  OT General Charges $OT Visit: 1 Visit OT Evaluation $OT Eval Low Complexity: 1 Low  Elma JONETTA Lebron FREDERICK, OTR/L [facility] Acute Rehabilitation Office: [phone]   Elma JONETTA Lebron 09/04/2023, 4:52 PM

## 2023-09-05 DIAGNOSIS — I214 Non-ST elevation (NSTEMI) myocardial infarction: Secondary | ICD-10-CM | POA: Diagnosis not present

## 2023-09-05 DIAGNOSIS — S81802D Unspecified open wound, left lower leg, subsequent encounter: Secondary | ICD-10-CM

## 2023-09-05 DIAGNOSIS — I5042 Chronic combined systolic (congestive) and diastolic (congestive) heart failure: Secondary | ICD-10-CM | POA: Diagnosis not present

## 2023-09-05 DIAGNOSIS — I509 Heart failure, unspecified: Secondary | ICD-10-CM | POA: Diagnosis not present

## 2023-09-05 LAB — RENAL FUNCTION PANEL
Albumin: 2.8 g/dL — ABNORMAL LOW (ref 3.5–5.0)
Anion gap: 9 (ref 5–15)
BUN: 9 mg/dL (ref 8–23)
CO2: 21 mmol/L — ABNORMAL LOW (ref 22–32)
Calcium: 8.6 mg/dL — ABNORMAL LOW (ref 8.9–10.3)
Chloride: 108 mmol/L (ref 98–111)
Creatinine, Ser: 0.96 mg/dL (ref 0.44–1.00)
GFR, Estimated: 54 mL/min — ABNORMAL LOW (ref >60)
Glucose, Bld: 87 mg/dL (ref 70–99)
Phosphorus: 3.7 mg/dL (ref 2.5–4.6)
Potassium: 3.3 mmol/L — ABNORMAL LOW (ref 3.5–5.1)
Sodium: 138 mmol/L (ref 135–145)

## 2023-09-05 LAB — CBC
HCT: 31.1 % — ABNORMAL LOW (ref 36.0–46.0)
Hemoglobin: 10.2 g/dL — ABNORMAL LOW (ref 12.0–15.0)
MCH: 30.5 pg (ref 26.0–34.0)
MCHC: 32.8 g/dL (ref 30.0–36.0)
MCV: 93.1 fL (ref 80.0–100.0)
Platelets: 219 K/uL (ref 150–400)
RBC: 3.34 MIL/uL — ABNORMAL LOW (ref 3.87–5.11)
RDW: 16.9 % — ABNORMAL HIGH (ref 11.5–15.5)
WBC: 7.5 K/uL (ref 4.0–10.5)
nRBC: 0 % (ref 0.0–0.2)

## 2023-09-05 MED ORDER — DICLOFENAC SODIUM 1 % EX GEL
2.0000 g | Freq: Four times a day (QID) | CUTANEOUS | Status: DC | PRN
  Administered 2023-09-05 (×2): 2 g via TOPICAL
  Filled 2023-09-05: qty 100

## 2023-09-05 MED ORDER — DICLOFENAC SODIUM 1 % EX GEL: 2.0000 g | Freq: Four times a day (QID) | CUTANEOUS | 3 refills | Status: AC | PRN

## 2023-09-05 MED ORDER — DOXYCYCLINE HYCLATE 100 MG PO CAPS: 100.0000 mg | ORAL_CAPSULE | Freq: Two times a day (BID) | ORAL | 0 refills | Status: AC

## 2023-09-05 NOTE — Discharge Summary (Signed)
 Physician Discharge Summary   Patient: Vicki [name] MRN: [id number] DOB: 06-14-1923  Admit date:     09/01/2023  Discharge date: 09/05/23  Discharge Physician: Nydia Distance, MD    PCP: Clarance Joen HERO, MD   Recommendations at discharge:   Placed on doxycycline  100 mg twice daily for 7 days, wound care as per instructions to the left leg Continue all cardiac medications and outpatient follow-up with Anner.  Discharge Diagnoses:    NSTEMI (non-ST elevated myocardial infarction) (HCC)   Chronic combined systolic and diastolic heart failure (HCC)   Essential hypertension   Hypothyroidism   PAF (paroxysmal atrial fibrillation) (HCC)   ARF (acute renal failure) (HCC)   Leg wound, left, cellulitis    Hospital Course:  Patient is a 89 year old female with chronic HFrEF last 2D echo done in March 2025 showed EF of 20-25% with prior history of NSTEMI  in December 2024 when patient declined cardiac cath, CKD stage II, hypothyroidism, HTN was brought to the ER after patient started developing chest pain.  Patient started having retrosternal chest pain radiating to the back while on the commode.  Patient had a similar episode about 3 days ago which resolved without any intervention.  Patient denied any shortness of breath productive cough fever chills.  Has had wound on the left leg from recent scratch on the furniture.  No abdominal pain.   ED Course:  BNP 820.3, troponin 47-178 CTA chest abdomen pelvis showed no acute aortic syndrome, cardiomegaly with mild interstitial edema, extensive colonic diverticulosis greatest about the sigmoid colon, mild wall thickening of the sigmoid colon which may be due to mild diverticulitis  Patient was placed on IV Lasix , started on Rocephin  for the left leg   Assessment and Plan:  Non-STEMI (non-ST elevated myocardial infarction) (HCC) -2D echo 05/2023 showed EF 20 to 25%, severely decreased LV function, moderately reduced RV systolic function,  severe MR, mild aortic stenosis.   - Continue aspirin , losartan , Coreg , Imdur , Lasix , Crestor .  Patient declined cardiac cath -Cardiology following, recommended medical management, continue heparin  drip for 48 hours, aspirin  81 mg daily, Imdur  30 mg daily and palliative consult. -- Completed 48 hours of IV heparin , IV heparin  drip has discontinued  - Patient is cleared for discharge by cardiology.  Recommended to continue aspirin  81 mg daily, Lasix  20 mg daily, Imdur  30 mg daily, losartan  25 mg daily -Seen by palliative medicine inpatient, palliative medicine to follow-up outpatient     Chronic combined systolic and diastolic CHF (congestive heart failure) (HCC) - Per most recent echo, EF 20 to 25%.   - On medical management for #1, palliative medicine consulted     Essential hypertension -BP controlled, resumed losartan  - Previously on Coreg  but discontinued due to bradycardia     Hypothyroidism Continue Synthroid      Paroxysmal atrial fibrillation (HCC) -Cardiology following, continue amiodarone  100 mg every other day -Previously had a declined anticoagulation, previously on Coreg  but discontinued due to bradycardia     Acute kidney injury -Resolved, creatinine 1.05 on admission - Improved, at baseline     Leg wound, left, cellulitis -  wound care was consulted -patient was placed on IV Rocephin  while inpatient, transition to oral doxycycline    Generalized debility -PT OT evaluation recommended home health PT OT   Estimated body mass index is 25.85 kg/m as calculated from the following:   Height as of this encounter: 5' 2 (1.575 m).     Pain control - [place]  Controlled Substance Reporting  System database was reviewed. and patient was instructed, not to drive, operate heavy machinery, perform activities at heights, swimming or participation in water activities or provide baby-sitting services while on Pain, Sleep and Anxiety Medications; until their outpatient  Physician has advised to do so again. Also recommended to not to take more than prescribed Pain, Sleep and Anxiety Medications.  Consultants: Cardiology, palliative medicine  Procedures performed: 2D echo Disposition: Home Diet recommendation:  Discharge Diet Orders (From admission, onward)     Start     Ordered   09/05/23 0000  Diet - low sodium heart healthy        09/05/23 1107            DISCHARGE MEDICATION: Allergies as of 09/05/2023       Reactions   Codeine Nausea Only   Makes her crazy Hallucinations    Latex Itching   Penicillins Rash   YEAST INFECTION  YEAST INFECTION   Sulfa Antibiotics Anaphylaxis, Other (See Comments)   Heart stops   Thiopental Anaphylaxis, Rash   Other reaction(s): Unknown  CARDIAC ARREST  CARDIAC ARREST   Statins Itching   Itchy scalp with hair loss, confusion        Medication List     TAKE these medications    amiodarone  200 MG tablet Commonly known as: PACERONE  Take 0.5 tablets (100 mg total) by mouth every other day. Starting on June 16,2025   aspirin  EC 81 MG tablet Take 81 mg by mouth daily. Swallow whole.   Biotin 10 MG Caps Take 10 mg by mouth daily.   CALCIUM  600/VITAMIN D PO Take 1 tablet by mouth daily.   CLEAR EYES FOR DRY EYES OP Place 2 drops into both eyes daily as needed (Dry eyes).   clotrimazole -betamethasone  cream Commonly known as: LOTRISONE  Apply 1 Application topically 2 (two) times daily as needed (irritation).   diclofenac  Sodium 1 % Gel Commonly known as: VOLTAREN  Apply 2 g topically 4 (four) times daily as needed (Chronic knee pain). Apply to area of chronic knee pain.   doxycycline  100 MG capsule Commonly known as: VIBRAMYCIN  Take 1 capsule (100 mg total) by mouth 2 (two) times daily for 7 days.   feeding supplement Liqd Take 237 mLs by mouth 2 (two) times daily between meals. What changed:  when to take this additional instructions   Fish Oil 1000 MG Caps Take 1,000 mg by mouth  daily.   furosemide  20 MG tablet Commonly known as: LASIX  Take 20 mg by mouth daily.   isosorbide  mononitrate 30 MG 24 hr tablet Commonly known as: IMDUR  Take 30 mg by mouth daily.   levothyroxine  50 MCG tablet Commonly known as: SYNTHROID  Take 50 mcg by mouth daily.   losartan  25 MG tablet Commonly known as: COZAAR  Take 1 tablet (25 mg total) by mouth daily.   melatonin 3 MG Tabs tablet Take 6 mg by mouth at bedtime.   omeprazole 20 MG capsule Commonly known as: PRILOSEC Take 20 mg by mouth daily.   Thera-M Tabs Take 1 tablet by mouth daily.               Discharge Care Instructions  (From admission, onward)           Start     Ordered   09/05/23 0000  Discharge wound care:       Comments: Wound care daily : apply Medihoney to left leg wound Q day, then cover with foam dressing.  Change foam dressing Q  3 days or PRN soiling   09/05/23 1107            Follow-up Information     Ryter-Brown, Joen HERO, MD. Schedule an appointment as soon as possible for a visit in 2 week(s).   Specialty: Family Medicine Why: obtain labs, BMET/renal panel Contact information: [address] [place] [address] [phone]         Anner Alm ORN, MD. Schedule an appointment as soon as possible for a visit in 2 week(s).   Specialty: Cardiology Why: for hospital follow-up Contact information: [address] [place] [address] [phone]                Discharge Exam: Fredricka Weights   09/02/23 1411 09/03/23 0505 09/04/23 0439  Weight: 63.4 kg 64.9 kg 64.1 kg   S: Sitting upright in the bed, eating breakfast, wants to go home today.  No acute complaints, no chest pain, shortness of breath, fevers or chills, no abdominal pain.  BP (!) 107/59 (BP Location: Left Arm)   Pulse 68   Temp 97.7 F (36.5 C) (Oral)   Resp 17   Ht 5' 2 (1.575 m)   Wt 64.1 kg   SpO2 96%   BMI 25.85 kg/m   Physical Exam General: Alert  and oriented x 3, NAD Cardiovascular: S1 S2 clear, RRR.  Respiratory: CTAB, no wheezing, rales or rhonchi Gastrointestinal: Soft, nontender, nondistended, NBS Ext: no pedal edema bilaterally Neuro: no new deficits Psych: Normal affect, pleasant   Condition at discharge: fair  The results of significant diagnostics from this hospitalization (including imaging, microbiology, ancillary and laboratory) are listed below for reference.   Imaging Studies: ECHOCARDIOGRAM LIMITED Result Date: 09/02/2023    ECHOCARDIOGRAM LIMITED REPORT   Patient Name:   LETISHA YERA Date of Exam: 09/02/2023 Medical Rec #:  [id number]       Height:       62.0 in Accession #:    [id number]      Weight:       140.0 lb Date of Birth:  [birth year]      BSA:          1.643 m Patient Age:    89 years        BP:           113/56 mmHg Patient Gender: F               HR:           73 bpm. Exam Location:  Inpatient Procedure: Cardiac Doppler, Color Doppler, Limited Echo and Intracardiac            Opacification Agent (Both Spectral and Color Flow Doppler were            utilized during procedure). Indications:    NSTEMI  History:        Patient has prior history of Echocardiogram examinations, most                 recent 08/16/2023. CHF, Acute MI, Arrythmias:Atrial Fibrillation,                 Signs/Symptoms:Chest Pain; Risk Factors:Hypertension.  Sonographer:    Therisa Crouch Referring Phys: [id number] CHRISTOPHER L SCHUMANN IMPRESSIONS  1. Left ventricular ejection fraction, by estimation, is 20 to 25%. The left ventricle has severely decreased function. The left ventricle demonstrates global hypokinesis.  2. Right ventricular systolic function is low normal. The right ventricular size is normal. There  is normal pulmonary artery systolic pressure.  3. The mitral valve is degenerative. incompletely visualized mitral valve regurgitation. Moderate mitral annular calcification.  4. The inferior vena cava is normal in size with greater than 50%  respiratory variability, suggesting right atrial pressure of 3 mmHg. Comparison(s): Prior images reviewed side by side. EF appears worse compared to prior. FINDINGS  Left Ventricle: Left ventricular ejection fraction, by estimation, is 20 to 25%. The left ventricle has severely decreased function. The left ventricle demonstrates global hypokinesis. Definity  contrast agent was given IV to delineate the left ventricular endocardial borders. Right Ventricle: The right ventricular size is normal. Right vetricular wall thickness was not well visualized. Right ventricular systolic function is low normal. There is normal pulmonary artery systolic pressure. The tricuspid regurgitant velocity is 2.47 m/s, and with an assumed right atrial pressure of 3 mmHg, the estimated right ventricular systolic pressure is 27.4 mmHg. Mitral Valve: The mitral valve is degenerative in appearance. There is mild thickening of the mitral valve leaflet(s). There is mild calcification of the mitral valve leaflet(s). Moderate mitral annular calcification. Incompletely visualized mitral valve  regurgitation. Venous: The inferior vena cava is normal in size with greater than 50% respiratory variability, suggesting right atrial pressure of 3 mmHg. LEFT VENTRICLE PLAX 2D LVIDd:         5.10 cm LVIDs:         4.50 cm LV PW:         0.80 cm LV IVS:        0.70 cm  LV Volumes (MOD) LV vol d, MOD A2C: 134.0 ml LV vol d, MOD A4C: 152.0 ml LV vol s, MOD A2C: 99.4 ml LV vol s, MOD A4C: 112.0 ml LV SV MOD A2C:     34.6 ml LV SV MOD A4C:     152.0 ml LV SV MOD BP:      38.0 ml RIGHT VENTRICLE            IVC RV S prime:     8.81 cm/s  IVC diam: 1.40 cm TAPSE (M-mode): 1.7 cm LEFT ATRIUM         Index LA diam:    5.20 cm 3.17 cm/m  TRICUSPID VALVE TR Peak grad:   24.4 mmHg TR Vmax:        247.00 cm/s Shelda Bruckner MD Electronically signed by Shelda Bruckner MD Signature Date/Time: 09/02/2023/6:51:04 PM    Final    CT Angio Chest/Abd/Pel for  Dissection W and/or W/WO Result Date: 09/02/2023 CLINICAL DATA:  Chest pain radiating to the back. Aortic aneurysm suspected. EXAM: CT ANGIOGRAPHY CHEST, ABDOMEN AND PELVIS TECHNIQUE: Non-contrast CT of the chest was initially obtained. Multidetector CT imaging through the chest, abdomen and pelvis was performed using the standard protocol during bolus administration of intravenous contrast. Multiplanar reconstructed images and MIPs were obtained and reviewed to evaluate the vascular anatomy. RADIATION DOSE REDUCTION: This exam was performed according to the departmental dose-optimization program which includes automated exposure control, adjustment of the mA and/or kV according to patient size and/or use of iterative reconstruction technique. CONTRAST:  75mL OMNIPAQUE  IOHEXOL  350 MG/ML SOLN COMPARISON:  None Available. FINDINGS: CTA CHEST FINDINGS Cardiovascular: Cardiomegaly. Coronary artery and aortic atherosclerotic calcification. No acute aortic syndrome. No pericardial effusion. Mediastinum/Nodes: Trachea and esophagus are unremarkable. No thoracic adenopathy. She is Lungs/Pleura: Scarring in the lower lobes. Lower lung interlobular septal thickening compatible with edema. No focal pneumonia. No pleural effusion or pneumothorax. Musculoskeletal: No acute fracture. Sclerotic lesion in the  T6 vertebral body is favored benign. Review of the MIP images confirms the above findings. CTA ABDOMEN AND PELVIS FINDINGS VASCULAR Aortic atherosclerotic calcification without hemodynamically significant stenosis. No aneurysm or dissection. Calcified atherosclerosis at the origins of the mesenteric, renal, and throughout the iliac arteries. No aneurysm or dissection. Review of the MIP images confirms the above findings. NON-VASCULAR Hepatobiliary: No acute abnormality.  Cholecystectomy. Pancreas: Unremarkable. Spleen: Unremarkable. Adrenals/Urinary Tract: Normal adrenal glands. No urinary calculi or hydronephrosis.  Unremarkable bladder were not obscured by streak artifact. Stomach/Bowel: Normal caliber large and small bowel. Extensive colonic diverticulosis greatest about the sigmoid colon. There is mild wall thickening of the sigmoid colon which may be due to hypertrophy or mild diverticulitis. Stomach is within normal limits. Lymphatic: No lymphadenopathy. Reproductive: Hysterectomy. Other: No free intraperitoneal fluid or air. Musculoskeletal: No acute fracture. Demineralization. Right THA. Postoperative changes about the lumbar spine. Review of the MIP images confirms the above findings. IMPRESSION: 1. No acute aortic syndrome. 2. Cardiomegaly with mild interstitial pulmonary edema. 3. Extensive colonic diverticulosis greatest about the sigmoid colon. There is mild wall thickening of the sigmoid colon which may be due to smooth muscle hypertrophy or mild diverticulitis. 4. Aortic Atherosclerosis (ICD10-I70.0). Electronically Signed   By: Norman Gatlin M.D.   On: 09/02/2023 00:23   DG Chest 2 View Result Date: 09/01/2023 CLINICAL DATA:  Chest pain. EXAM: CHEST - 2 VIEW COMPARISON:  May 08, 2023 FINDINGS: The cardiac silhouette is mildly enlarged and unchanged in size. There is marked severity calcification of the thoracic aorta. Diffusely increased interstitial lung markings are seen. Mild areas of atelectasis and/or infiltrate are present within the bilateral lung bases. No pleural effusion or pneumothorax is identified. Radiopaque surgical clips are seen overlying the right upper quadrant. The visualized skeletal structures are unremarkable. IMPRESSION: 1. Stable cardiomegaly mild to moderate severity interstitial pulmonary edema. 2. Mild bibasilar atelectasis and/or infiltrate. Electronically Signed   By: Suzen Dials M.D.   On: 09/01/2023 21:49   ECHOCARDIOGRAM COMPLETE Result Date: 08/16/2023    ECHOCARDIOGRAM REPORT   Patient Name:   TENELLE ANDREASON Date of Exam: 08/16/2023 Medical Rec #:  [id number]        Height:       62.0 in Accession #:    [id number]      Weight:       141.2 lb Date of Birth:  Jul 19, 1923      BSA:          1.649 m Patient Age:    89 years        BP:           130/62 mmHg Patient Gender: F               HR:           72 bpm. Exam Location:  [address] Procedure: 2D Echo and 3D Echo (Both Spectral and Color Flow Doppler were            utilized during procedure). Indications:    I10 Hypertension  History:        Patient has prior history of Echocardiogram examinations, most                 recent 05/10/2023. Cardiomyopathy and CHF, Previous Myocardial                 Infarction, Arrythmias:Atrial Fibrillation; Risk                 Factors:Dyslipidemia. Hypothyroidism.  Sonographer:    Jon Hacker RCS Referring Phys: [phone] DAVID W HARDING IMPRESSIONS  1. Left ventricular ejection fraction, by estimation, is 25 to 30%. The left ventricle has severely decreased function. The left ventricle demonstrates global hypokinesis. The left ventricular internal cavity size was mildly dilated. Left ventricular diastolic parameters are consistent with Grade II diastolic dysfunction (pseudonormalization). Elevated left atrial pressure.  2. Right ventricular systolic function is normal. The right ventricular size is normal.  3. Left atrial size was moderately dilated.  4. The mitral valve is normal in structure. Severe mitral valve regurgitation. No evidence of mitral stenosis.  5. The aortic valve is tricuspid. Aortic valve regurgitation is not visualized. No aortic stenosis is present.  6. The inferior vena cava is normal in size with greater than 50% respiratory variability, suggesting right atrial pressure of 3 mmHg. FINDINGS  Left Ventricle: Left ventricular ejection fraction, by estimation, is 25 to 30%. The left ventricle has severely decreased function. The left ventricle demonstrates global hypokinesis. The left ventricular internal cavity size was mildly dilated. There is no left ventricular  hypertrophy. Left ventricular diastolic parameters are consistent with Grade II diastolic dysfunction (pseudonormalization). Elevated left atrial pressure. Right Ventricle: The right ventricular size is normal. Right ventricular systolic function is normal. Left Atrium: Left atrial size was moderately dilated. Right Atrium: Right atrial size was normal in size. Pericardium: There is no evidence of pericardial effusion. Mitral Valve: The mitral valve is normal in structure. Mild mitral annular calcification. Severe mitral valve regurgitation. No evidence of mitral valve stenosis. Tricuspid Valve: The tricuspid valve is normal in structure. Tricuspid valve regurgitation is mild . No evidence of tricuspid stenosis. Aortic Valve: The aortic valve is tricuspid. Aortic valve regurgitation is not visualized. No aortic stenosis is present. Pulmonic Valve: The pulmonic valve was normal in structure. Pulmonic valve regurgitation is mild. No evidence of pulmonic stenosis. Aorta: The aortic root is normal in size and structure. Venous: The inferior vena cava is normal in size with greater than 50% respiratory variability, suggesting right atrial pressure of 3 mmHg. IAS/Shunts: No atrial level shunt detected by color flow Doppler. Additional Comments: 3D was performed not requiring image post processing on an independent workstation and was abnormal.  LEFT VENTRICLE PLAX 2D LVIDd:         5.68 cm   Diastology LVIDs:         4.79 cm   LV e' medial:    4.43 cm/s LV PW:         1.14 cm   LV E/e' medial:  30.0 LV IVS:        0.87 cm   LV e' lateral:   10.80 cm/s LVOT diam:     2.00 cm   LV E/e' lateral: 12.3 LV SV:         73 LV SV Index:   44 LVOT Area:     3.14 cm                           3D Volume EF:                          3D EF:        37 %                          LV EDV:       191 ml  LV ESV:       120 ml                          LV SV:        71 ml RIGHT VENTRICLE RV Basal diam:  3.16 cm RV S  prime:     10.60 cm/s TAPSE (M-mode): 1.7 cm LEFT ATRIUM             Index        RIGHT ATRIUM           Index LA diam:        5.20 cm 3.15 cm/m   RA Area:     13.60 cm LA Vol (A2C):   48.7 ml 29.54 ml/m  RA Volume:   32.10 ml  19.47 ml/m LA Vol (A4C):   64.3 ml 39.00 ml/m LA Biplane Vol: 56.7 ml 34.39 ml/m  AORTIC VALVE LVOT Vmax:   123.00 cm/s LVOT Vmean:  71.800 cm/s LVOT VTI:    0.231 m  AORTA Ao Root diam: 3.00 cm MITRAL VALVE MV Area (PHT): 4.80 cm     SHUNTS MV Decel Time: 158 msec     Systemic VTI:  0.23 m MV E velocity: 133.00 cm/s  Systemic Diam: 2.00 cm MV A velocity: 99.20 cm/s MV E/A ratio:  1.34 Redell Shallow MD Electronically signed by Redell Shallow MD Signature Date/Time: 08/16/2023/1:55:25 PM    Final     Microbiology: Results for orders placed or performed during the hospital encounter of 05/08/23  Resp panel by RT-PCR (RSV, Flu A&B, Covid) Anterior Nasal Swab     Status: None   Collection Time: 05/08/23  8:10 PM   Specimen: Anterior Nasal Swab  Result Value Ref Range Status   SARS Coronavirus 2 by RT PCR NEGATIVE NEGATIVE Final   Influenza A by PCR NEGATIVE NEGATIVE Final   Influenza B by PCR NEGATIVE NEGATIVE Final    Comment: (NOTE) The Xpert Xpress SARS-CoV-2/FLU/RSV plus assay is intended as an aid in the diagnosis of influenza from Nasopharyngeal swab specimens and should not be used as a sole basis for treatment. Nasal washings and aspirates are unacceptable for Xpert Xpress SARS-CoV-2/FLU/RSV testing.  Fact Sheet for Patients: BloggerCourse.com  Fact Sheet for Healthcare Providers: SeriousBroker.it  This test is not yet approved or cleared by the [place]  FDA and has been authorized for detection and/or diagnosis of SARS-CoV-2 by FDA under an Emergency Use Authorization (EUA). This EUA will remain in effect (meaning this test can be used) for the duration of the COVID-19 declaration under Section  564(b)(1) of the Act, 21 U.S.C. section 360bbb-3(b)(1), unless the authorization is terminated or revoked.     Resp Syncytial Virus by PCR NEGATIVE NEGATIVE Final    Comment: (NOTE) Fact Sheet for Patients: BloggerCourse.com  Fact Sheet for Healthcare Providers: SeriousBroker.it  This test is not yet approved or cleared by the [place]  FDA and has been authorized for detection and/or diagnosis of SARS-CoV-2 by FDA under an Emergency Use Authorization (EUA). This EUA will remain in effect (meaning this test can be used) for the duration of the COVID-19 declaration under Section 564(b)(1) of the Act, 21 U.S.C. section 360bbb-3(b)(1), unless the authorization is terminated or revoked.  Performed at [facility], [address] [place], [place] [address]     Labs: CBC: Recent Labs  Lab 09/01/23 2110 09/02/23 0434 09/03/23 0715 09/04/23 0231 09/05/23 0306  WBC 9.3 9.0  7.3 9.0 7.5  NEUTROABS  --  4.3  --   --   --   HGB 12.2 11.9* 11.2* 10.9* 10.2*  HCT 37.6 35.8* 34.8* 33.1* 31.1*  MCV 93.1 93.2 92.6 92.5 93.1  PLT 278 266 228 237 219   Basic Metabolic Panel: Recent Labs  Lab 09/01/23 2110 09/02/23 0434 09/03/23 0715 09/04/23 0231 09/05/23 0306  NA 134* 137 137 137 138  K 4.1 3.5 3.5 3.7 3.3*  CL 103 102 105 105 108  CO2 20* 23 24 21* 21*  GLUCOSE 168* 108* 90 92 87  BUN 21 19 15 11 9   CREATININE 1.05* 0.94 0.82 0.97 0.96  CALCIUM  9.2 8.8* 8.6* 8.7* 8.6*  PHOS  --   --  3.4 3.6 3.7   Liver Function Tests: Recent Labs  Lab 09/01/23 2259 09/02/23 0434 09/03/23 0715 09/04/23 0231 09/05/23 0306  AST 25 42*  --   --   --   ALT 26 37  --   --   --   ALKPHOS 54 57  --   --   --   BILITOT 0.6 0.6  --   --   --   PROT 6.1* 6.1*  --   --   --   ALBUMIN 3.4* 3.3* 2.9* 2.9* 2.8*   CBG: No results for input(s): GLUCAP in the last 168 hours.  Discharge time spent: greater than 30  minutes.  Signed: Nydia Distance, MD [facility] 09/05/2023

## 2023-09-05 NOTE — Progress Notes (Signed)
 Vicki [name] to be discharged home per MD order. Discussed with the patient, son Sheena, and daughter-in-law Laura, and all questions fully answered.  Skin clean and dry, dressing changed to left shin prior to DC. Remainder of medi-honey given to patient and educated on use. IV catheter discontinued intact. Site without signs and symptoms of complications. Dressing and pressure applied.  An After Visit Summary was printed and given to the patient.  Patient escorted via [facility], and discharged home via private auto.  Vicki [name]  09/05/2023 3:32 PM

## 2023-09-05 NOTE — Progress Notes (Signed)
 Rounding Note   Patient Name: Vicki [name] Date of Encounter: 09/05/2023  [facility] Cardiologist: Alm Clay, MD   Subjective BP 107/59.  Creatinine 0.96.  Hemoglobin 10.2.  Denies any chest pain or dyspnea  Scheduled Meds:  amiodarone   100 mg Oral QODAY   aspirin  EC  81 mg Oral Daily   heparin   5,000 Units Subcutaneous Q8H   isosorbide  mononitrate  30 mg Oral Daily   leptospermum manuka honey  1 Application Topical Daily   levothyroxine   50 mcg Oral Daily   losartan   25 mg Oral Daily   pantoprazole   40 mg Oral Daily   Continuous Infusions:  cefTRIAXone  (ROCEPHIN )  IV 1 g (09/04/23 2026)   PRN Meds: acetaminophen , clotrimazole -betamethasone , diclofenac  Sodium, melatonin   Vital Signs  Vitals:   09/04/23 2029 09/04/23 2313 09/05/23 0450 09/05/23 0721  BP: (!) 107/57 (!) 122/52 128/66 (!) 107/59  Pulse:      Resp:  17 16 17   Temp: 98.2 F (36.8 C) 97.9 F (36.6 C) 97.6 F (36.4 C) 97.7 F (36.5 C)  TempSrc:  Oral Oral Oral  SpO2: 96% 96% 97% 96%  Weight:      Height:        Intake/Output Summary (Last 24 hours) at 09/05/2023 0849 Last data filed at 09/05/2023 0300 Gross per 24 hour  Intake 900.06 ml  Output --  Net 900.06 ml      09/04/2023    4:39 AM 09/03/2023    5:05 AM 09/02/2023    2:11 PM  Last 3 Weights  Weight (lbs) 141 lb 5 oz 143 lb 1.3 oz 139 lb 12.4 oz  Weight (kg) 64.1 kg 64.9 kg 63.4 kg      Telemetry NSR with PVCs - Personally Reviewed  ECG  NO new ECG - Personally Reviewed  Physical Exam  GEN: No acute distress.   Neck: No JVD Cardiac: RRR, no murmurs, rubs, or gallops.  Respiratory: Clear to auscultation bilaterally. GI: Soft, nontender, non-distended  MS: Trivial edema Neuro:  Nonfocal  Psych: Normal affect   Labs High Sensitivity Troponin:   Recent Labs  Lab 09/01/23 2110 09/01/23 2259  TROPONINIHS 47* 178*     Chemistry Recent Labs  Lab 09/01/23 2259 09/02/23 0434 09/03/23 0715 09/04/23 0231  09/05/23 0306  NA  --  137 137 137 138  K  --  3.5 3.5 3.7 3.3*  CL  --  102 105 105 108  CO2  --  23 24 21* 21*  GLUCOSE  --  108* 90 92 87  BUN  --  19 15 11 9   CREATININE  --  0.94 0.82 0.97 0.96  CALCIUM   --  8.8* 8.6* 8.7* 8.6*  PROT 6.1* 6.1*  --   --   --   ALBUMIN 3.4* 3.3* 2.9* 2.9* 2.8*  AST 25 42*  --   --   --   ALT 26 37  --   --   --   ALKPHOS 54 57  --   --   --   BILITOT 0.6 0.6  --   --   --   GFRNONAA  --  55* >60 53* 54*  ANIONGAP  --  12 8 11 9     Lipids No results for input(s): CHOL, TRIG, HDL, LABVLDL, LDLCALC, CHOLHDL in the last 168 hours.  Hematology Recent Labs  Lab 09/03/23 0715 09/04/23 0231 09/05/23 0306  WBC 7.3 9.0 7.5  RBC 3.76* 3.58* 3.34*  HGB  11.2* 10.9* 10.2*  HCT 34.8* 33.1* 31.1*  MCV 92.6 92.5 93.1  MCH 29.8 30.4 30.5  MCHC 32.2 32.9 32.8  RDW 16.8* 17.0* 16.9*  PLT 228 237 219   Thyroid  No results for input(s): TSH, FREET4 in the last 168 hours.  BNP Recent Labs  Lab 09/01/23 2110  BNP 820.3*    DDimer No results for input(s): DDIMER in the last 168 hours.   Radiology  No results found.   Cardiac Studies   Patient Profile   89 y.o. female with a hx of coronary artery disease (NSTEMI),  paroxysmal AF, HFrEF, mitral regurgitation, hypertension, hyperlipidemia, chronic kidney disease stage III, hypothyroidism, and rheumatoid arthritis   Assessment & Plan  NSTEMI: She was admitted in December with NSTEMI, troponin peak 2400.  Echocardiogram showed EF 40 to 45%.  She declined LHC at that time.  She was hospitalized in March 2025 with acute on chronic heart failure, A-fib with RVR.  Echocardiogram at that time showed EF 20-25%, severe mitral regurgitation.  She declined LHC at that time as well.  She also declined anticoagulation.  Echocardiogram 08/2023 showed EF 25 to 30%.  She presented with chest pain this admission, troponin 47 > 178. CTA chest abdomen pelvis showed no acute aortic syndrome, severe  coronary calcifications.   - Discussed LHC but she declines invasive procedures, which is reasonable given her age.  Will plan medical management for NSTEMI  - Continue aspirin  81 mg daily - Completed 48 hours of heparin  drip, has been discontinued - Continue Imdur  30 mg daily - Intolerance to statins, not currently taking  Chronic combined heart failure: Echo this admission shows EF 20 to 25%, low normal RV function. - She appears euvolemic.  - Restarted home losartan .  Previously on carvedilol  but discontinued due to bradycardia  Paroxysmal atrial fibrillation: She has declined anticoagulation.  On amiodarone  100 mg every other day, maintaining sinus rhythm.  Continue amiodarone .  Previously was on carvedilol  but discontinued due to bradycardia  Goals of care: Agree with palliative consult   [facility] will sign off.   Medication Recommendations:  Amiodarone  100 mg every other day, aspirin  81 mg daily, Lasix  20 mg daily, Imdur  30 mg daily, losartan  25 mg daily Other recommendations (labs, testing, etc):  BMET in 1 week Follow up as an outpatient:  Will schedule   For questions or updates, please contact [facility] Please consult [email or web address] for contact info under     Signed, [name], MD  09/05/2023, 8:49 AM

## 2023-09-05 NOTE — TOC Transition Note (Signed)
 Transition of Care [facility]) - Discharge Note   Patient Details  Name: Vicki [name] MRN: [id number] Date of Birth: 04-16-1923  [facility] CM/SW Contact:  Marval Gell, RN Phone Number: 09/05/2023, 10:32 AM   Clinical Narrative:     Discussed w attending, palliative, and family.  Plan will be for DC home w [facility] services through [place], and palliative services through [facility], agencies notified and accepting. HoP will see patient at home on 7/10.  Family states patient has care givers and all DME needed, and that they will transport home.  No other TOC needs identified at this time.      Kissinger,MARTHE (Relative) [phone]    Final next level of care: Home w Home Health Services Barriers to Discharge: No Barriers Identified   Patient Goals and CMS Choice Patient states their goals for this hospitalization and ongoing recovery are:: return home CMS Medicare.gov Compare Post Acute Care list provided to:: Other (Comment Required) Choice offered to / list presented to : [facility] POA / Guardian      Discharge Placement                       Discharge Plan and Services Additional resources added to the After Visit Summary for                  DME Arranged: N/A         HH Arranged: PT, OT, Nurse's Aide HH Agency: [facility] Date [facility] Agency Contacted: 09/05/23 Time HH Agency Contacted: 1032 Representative spoke with at [facility] Agency: Darleene  Social Drivers of Health (SDOH) Interventions SDOH Screenings   Food Insecurity: No Food Insecurity (09/02/2023)  Housing: Low Risk  (09/02/2023)  Transportation Needs: No Transportation Needs (09/02/2023)  Utilities: Not At Risk (09/02/2023)  Financial Resource Strain: Low Risk  (07/20/2023)   Received from [facility]  Physical Activity: Unknown (07/20/2023)   Received from [facility]  Social Connections: Unknown (09/02/2023)  Stress: No Stress Concern Present (07/20/2023)   Received from  [facility]  Tobacco Use: Low Risk  (09/02/2023)     Readmission Risk Interventions    09/02/2023    2:58 PM 05/10/2023    3:32 PM  Readmission Risk Prevention Plan  Transportation Screening Complete Complete  PCP or Specialist Appt within 5-7 Days Complete Complete  Home Care Screening Complete Complete  Medication Review (RN CM) Complete Complete

## 2023-09-05 NOTE — Plan of Care (Signed)
   Problem: Education: Goal: Knowledge of General Education information will improve Description: Including pain rating scale, medication(s)/side effects and non-pharmacologic comfort measures Outcome: Progressing   Problem: Health Behavior/Discharge Planning: Goal: Ability to manage health-related needs will improve Outcome: Progressing   Problem: Activity: Goal: Risk for activity intolerance will decrease Outcome: Progressing   Problem: Coping: Goal: Level of anxiety will decrease Outcome: Progressing   Problem: Pain Managment: Goal: General experience of comfort will improve and/or be controlled Outcome: Progressing

## 2023-09-27 ENCOUNTER — Ambulatory Visit: Admitting: Nurse Practitioner

## 2023-11-23 ENCOUNTER — Ambulatory Visit: Admitting: Cardiology
